# Patient Record
Sex: Male | Born: 1945 | Race: White | Hispanic: No | Marital: Married | Smoking: Never smoker
Health system: Southern US, Community
[De-identification: ages and names within clinical notes are randomized; demographics above are authoritative.]

## PROBLEM LIST (undated history)

## (undated) DIAGNOSIS — I495 Sick sinus syndrome: Secondary | ICD-10-CM

## (undated) DIAGNOSIS — Z950 Presence of cardiac pacemaker: Secondary | ICD-10-CM

## (undated) DIAGNOSIS — I4891 Unspecified atrial fibrillation: Secondary | ICD-10-CM

## (undated) DIAGNOSIS — C61 Malignant neoplasm of prostate: Secondary | ICD-10-CM

## (undated) DIAGNOSIS — M109 Gout, unspecified: Secondary | ICD-10-CM

## (undated) DIAGNOSIS — I429 Cardiomyopathy, unspecified: Secondary | ICD-10-CM

## (undated) DIAGNOSIS — N3289 Other specified disorders of bladder: Secondary | ICD-10-CM

## (undated) DIAGNOSIS — I639 Cerebral infarction, unspecified: Secondary | ICD-10-CM

## (undated) DIAGNOSIS — Z87442 Personal history of urinary calculi: Secondary | ICD-10-CM

## (undated) DIAGNOSIS — R300 Dysuria: Secondary | ICD-10-CM

## (undated) DIAGNOSIS — I499 Cardiac arrhythmia, unspecified: Secondary | ICD-10-CM

## (undated) DIAGNOSIS — M1990 Unspecified osteoarthritis, unspecified site: Secondary | ICD-10-CM

## (undated) HISTORY — DX: Malignant neoplasm of prostate: C61

## (undated) HISTORY — PX: TONSILLECTOMY AND ADENOIDECTOMY: SUR1326

## (undated) HISTORY — DX: Cerebral infarction, unspecified: I63.9

## (undated) HISTORY — PX: INSERT / REPLACE / REMOVE PACEMAKER: SUR710

## (undated) HISTORY — DX: Unspecified atrial fibrillation: I48.91

## (undated) HISTORY — PX: REPLACEMENT TOTAL KNEE: SUR1224

## (undated) HISTORY — PX: PACEMAKER INSERTION: SHX728

## (undated) HISTORY — DX: Cardiomyopathy, unspecified: I42.9

## (undated) HISTORY — PX: INGUINAL HERNIA REPAIR: SUR1180

## (undated) HISTORY — DX: Gout, unspecified: M10.9

## (undated) HISTORY — DX: Unspecified osteoarthritis, unspecified site: M19.90

## (undated) HISTORY — DX: Sick sinus syndrome: I49.5

## (undated) HISTORY — DX: Other specified disorders of bladder: N32.89

## (undated) HISTORY — PX: PROSTATECTOMY: SHX69

## (undated) HISTORY — DX: Dysuria: R30.0

---

## 1978-12-13 HISTORY — PX: JOINT REPLACEMENT: SHX530

## 1995-12-14 HISTORY — PX: REPLACEMENT TOTAL HIP W/  RESURFACING IMPLANTS: SUR1222

## 2005-01-15 ENCOUNTER — Ambulatory Visit: Payer: Self-pay | Admitting: Gastroenterology

## 2008-03-29 ENCOUNTER — Ambulatory Visit: Payer: Self-pay | Admitting: Unknown Physician Specialty

## 2009-05-15 ENCOUNTER — Ambulatory Visit: Payer: Self-pay | Admitting: Cardiology

## 2009-05-22 ENCOUNTER — Ambulatory Visit: Payer: Self-pay | Admitting: Cardiology

## 2009-10-13 HISTORY — PX: OTHER SURGICAL HISTORY: SHX169

## 2009-10-30 ENCOUNTER — Ambulatory Visit: Payer: Self-pay | Admitting: Urology

## 2009-11-10 ENCOUNTER — Ambulatory Visit: Payer: Self-pay | Admitting: Urology

## 2009-11-18 ENCOUNTER — Ambulatory Visit: Payer: Self-pay

## 2010-09-28 ENCOUNTER — Ambulatory Visit: Payer: Self-pay | Admitting: General Practice

## 2010-09-28 ENCOUNTER — Ambulatory Visit: Payer: Self-pay | Admitting: Cardiology

## 2010-10-12 ENCOUNTER — Inpatient Hospital Stay: Payer: Self-pay | Admitting: General Practice

## 2011-02-12 ENCOUNTER — Ambulatory Visit: Payer: Self-pay | Admitting: Urology

## 2011-02-15 ENCOUNTER — Ambulatory Visit: Payer: Self-pay | Admitting: Urology

## 2011-02-25 ENCOUNTER — Ambulatory Visit: Payer: Self-pay | Admitting: Urology

## 2011-11-08 ENCOUNTER — Ambulatory Visit: Payer: Self-pay | Admitting: Internal Medicine

## 2012-07-31 ENCOUNTER — Ambulatory Visit: Payer: Self-pay | Admitting: Urology

## 2012-09-05 DIAGNOSIS — Z87442 Personal history of urinary calculi: Secondary | ICD-10-CM | POA: Insufficient documentation

## 2012-09-05 DIAGNOSIS — C61 Malignant neoplasm of prostate: Secondary | ICD-10-CM

## 2012-09-05 HISTORY — DX: Malignant neoplasm of prostate: C61

## 2012-11-16 ENCOUNTER — Ambulatory Visit: Payer: Self-pay | Admitting: Orthopedic Surgery

## 2012-12-13 HISTORY — PX: OTHER SURGICAL HISTORY: SHX169

## 2013-01-05 ENCOUNTER — Ambulatory Visit: Payer: Self-pay | Admitting: Oncology

## 2013-01-05 LAB — CBC CANCER CENTER
Basophil #: 0 x10 3/mm (ref 0.0–0.1)
Eosinophil #: 0.2 x10 3/mm (ref 0.0–0.7)
Eosinophil %: 4.2 %
HCT: 40.5 % (ref 40.0–52.0)
HGB: 13.8 g/dL (ref 13.0–18.0)
Lymphocyte #: 1.1 x10 3/mm (ref 1.0–3.6)
MCH: 31.3 pg (ref 26.0–34.0)
MCHC: 34.2 g/dL (ref 32.0–36.0)
Monocyte #: 0.4 x10 3/mm (ref 0.2–1.0)
Neutrophil #: 3.1 x10 3/mm (ref 1.4–6.5)
Neutrophil %: 64.6 %
Platelet: 122 x10 3/mm — ABNORMAL LOW (ref 150–440)
RDW: 13.9 % (ref 11.5–14.5)
WBC: 4.8 x10 3/mm (ref 3.8–10.6)

## 2013-01-05 LAB — COMPREHENSIVE METABOLIC PANEL
Albumin: 4.1 g/dL (ref 3.4–5.0)
Alkaline Phosphatase: 97 U/L (ref 50–136)
Bilirubin,Total: 1 mg/dL (ref 0.2–1.0)
Calcium, Total: 9.2 mg/dL (ref 8.5–10.1)
Chloride: 107 mmol/L (ref 98–107)
Creatinine: 0.81 mg/dL (ref 0.60–1.30)
EGFR (African American): >60
EGFR (Non-African Amer.): >60
Glucose: 88 mg/dL (ref 65–99)
Osmolality: 278 (ref 275–301)
Potassium: 4.8 mmol/L (ref 3.5–5.1)
Sodium: 139 mmol/L (ref 136–145)
Total Protein: 7.5 g/dL (ref 6.4–8.2)

## 2013-01-13 ENCOUNTER — Ambulatory Visit: Payer: Self-pay | Admitting: Oncology

## 2013-02-09 LAB — CBC CANCER CENTER
Basophil %: 1.1 %
Eosinophil %: 4 %
HCT: 39.5 % — ABNORMAL LOW (ref 40.0–52.0)
HGB: 13.6 g/dL (ref 13.0–18.0)
Lymphocyte #: 1.2 x10 3/mm (ref 1.0–3.6)
Lymphocyte %: 27.7 %
MCHC: 34.3 g/dL (ref 32.0–36.0)
Monocyte #: 0.3 x10 3/mm (ref 0.2–1.0)
Monocyte %: 7.6 %
Neutrophil %: 59.6 %
RDW: 13.9 % (ref 11.5–14.5)
WBC: 4.2 x10 3/mm (ref 3.8–10.6)

## 2013-02-10 ENCOUNTER — Ambulatory Visit: Payer: Self-pay | Admitting: Oncology

## 2013-02-23 LAB — CBC CANCER CENTER
Basophil #: 0 x10 3/mm (ref 0.0–0.1)
Basophil %: 0.6 %
Lymphocyte %: 24.2 %
MCH: 31.2 pg (ref 26.0–34.0)
MCHC: 33.8 g/dL (ref 32.0–36.0)
MCV: 93 fL (ref 80–100)
Monocyte #: 0.3 x10 3/mm (ref 0.2–1.0)
RBC: 4.02 10*6/uL — ABNORMAL LOW (ref 4.40–5.90)
WBC: 3.2 x10 3/mm — ABNORMAL LOW (ref 3.8–10.6)

## 2013-03-05 LAB — CBC CANCER CENTER
Basophil %: 0.5 %
Eosinophil #: 0.2 x10 3/mm (ref 0.0–0.7)
Eosinophil %: 3 %
HCT: 39.8 % — ABNORMAL LOW (ref 40.0–52.0)
HGB: 13.7 g/dL (ref 13.0–18.0)
Lymphocyte %: 16.7 %
MCH: 31.5 pg (ref 26.0–34.0)
MCV: 91 fL (ref 80–100)
Monocyte #: 0.5 x10 3/mm (ref 0.2–1.0)
Monocyte %: 8.8 %
Neutrophil %: 71 %
Platelet: 101 x10 3/mm — ABNORMAL LOW (ref 150–440)
RBC: 4.36 10*6/uL — ABNORMAL LOW (ref 4.40–5.90)
RDW: 14.4 % (ref 11.5–14.5)
WBC: 5.2 x10 3/mm (ref 3.8–10.6)

## 2013-03-09 LAB — CBC CANCER CENTER
Basophil #: 0 x10 3/mm (ref 0.0–0.1)
Basophil %: 0.4 %
Eosinophil %: 3.7 %
HCT: 38.2 % — ABNORMAL LOW (ref 40.0–52.0)
HGB: 13 g/dL (ref 13.0–18.0)
MCH: 31.3 pg (ref 26.0–34.0)
MCHC: 34.1 g/dL (ref 32.0–36.0)
Monocyte #: 0.4 x10 3/mm (ref 0.2–1.0)
Monocyte %: 9.1 %
Neutrophil #: 2.8 x10 3/mm (ref 1.4–6.5)
Neutrophil %: 70.3 %
Platelet: 107 x10 3/mm — ABNORMAL LOW (ref 150–440)
WBC: 4 x10 3/mm (ref 3.8–10.6)

## 2013-03-13 ENCOUNTER — Ambulatory Visit: Payer: Self-pay | Admitting: Oncology

## 2013-03-16 LAB — CBC CANCER CENTER
Eosinophil #: 0.1 x10 3/mm (ref 0.0–0.7)
Eosinophil %: 3.4 %
HCT: 39.2 % — ABNORMAL LOW (ref 40.0–52.0)
HGB: 13 g/dL (ref 13.0–18.0)
Lymphocyte #: 0.6 x10 3/mm — ABNORMAL LOW (ref 1.0–3.6)
MCH: 30.8 pg (ref 26.0–34.0)
MCHC: 33.2 g/dL (ref 32.0–36.0)
Monocyte #: 0.3 x10 3/mm (ref 0.2–1.0)
Monocyte %: 8.5 %
Neutrophil #: 2.7 x10 3/mm (ref 1.4–6.5)
Neutrophil %: 72.2 %
Platelet: 121 x10 3/mm — ABNORMAL LOW (ref 150–440)

## 2013-04-02 LAB — CBC CANCER CENTER
Basophil #: 0 x10 3/mm (ref 0.0–0.1)
Eosinophil #: 0.1 x10 3/mm (ref 0.0–0.7)
Eosinophil %: 2 %
HGB: 12.6 g/dL — ABNORMAL LOW (ref 13.0–18.0)
Lymphocyte #: 0.5 x10 3/mm — ABNORMAL LOW (ref 1.0–3.6)
Monocyte #: 0.3 x10 3/mm (ref 0.2–1.0)
Monocyte %: 8.5 %
Neutrophil %: 75.3 %
RBC: 3.98 10*6/uL — ABNORMAL LOW (ref 4.40–5.90)
RDW: 14.5 % (ref 11.5–14.5)
WBC: 3.6 x10 3/mm — ABNORMAL LOW (ref 3.8–10.6)

## 2013-04-06 LAB — CBC CANCER CENTER
Basophil #: 0 x10 3/mm (ref 0.0–0.1)
Basophil %: 0.2 %
Eosinophil #: 0.1 x10 3/mm (ref 0.0–0.7)
Eosinophil %: 1.7 %
HCT: 37.7 % — ABNORMAL LOW (ref 40.0–52.0)
HGB: 12.9 g/dL — ABNORMAL LOW (ref 13.0–18.0)
Lymphocyte #: 0.5 x10 3/mm — ABNORMAL LOW (ref 1.0–3.6)
MCH: 31.2 pg (ref 26.0–34.0)
Neutrophil %: 76.1 %

## 2013-04-12 ENCOUNTER — Ambulatory Visit: Payer: Self-pay | Admitting: Oncology

## 2013-05-13 ENCOUNTER — Ambulatory Visit: Payer: Self-pay | Admitting: Oncology

## 2013-06-07 LAB — CBC CANCER CENTER
Basophil #: 0 x10 3/mm (ref 0.0–0.1)
Eosinophil #: 0.1 x10 3/mm (ref 0.0–0.7)
HCT: 36.2 % — ABNORMAL LOW (ref 40.0–52.0)
Lymphocyte #: 0.6 x10 3/mm — ABNORMAL LOW (ref 1.0–3.6)
Lymphocyte %: 17.8 %
MCH: 31.9 pg (ref 26.0–34.0)
MCHC: 34.7 g/dL (ref 32.0–36.0)
MCV: 92 fL (ref 80–100)
Monocyte %: 8.6 %
Neutrophil #: 2.4 x10 3/mm (ref 1.4–6.5)
Neutrophil %: 71.3 %
Platelet: 102 x10 3/mm — ABNORMAL LOW (ref 150–440)
RBC: 3.95 10*6/uL — ABNORMAL LOW (ref 4.40–5.90)
RDW: 14.5 % (ref 11.5–14.5)
WBC: 3.4 x10 3/mm — ABNORMAL LOW (ref 3.8–10.6)

## 2013-06-07 LAB — BASIC METABOLIC PANEL
Anion Gap: 4 — ABNORMAL LOW (ref 7–16)
BUN: 19 mg/dL — ABNORMAL HIGH (ref 7–18)
Chloride: 109 mmol/L — ABNORMAL HIGH (ref 98–107)
Creatinine: 1.07 mg/dL (ref 0.60–1.30)
EGFR (African American): >60
EGFR (Non-African Amer.): >60
Glucose: 98 mg/dL (ref 65–99)
Osmolality: 283 (ref 275–301)
Potassium: 4.7 mmol/L (ref 3.5–5.1)

## 2013-06-08 LAB — PSA: PSA: 2.2 ng/mL (ref 0.0–4.0)

## 2013-06-12 ENCOUNTER — Ambulatory Visit: Payer: Self-pay | Admitting: Oncology

## 2013-06-12 ENCOUNTER — Ambulatory Visit: Payer: Self-pay | Admitting: Urology

## 2013-06-12 DIAGNOSIS — R350 Frequency of micturition: Secondary | ICD-10-CM | POA: Insufficient documentation

## 2013-06-12 DIAGNOSIS — N3289 Other specified disorders of bladder: Secondary | ICD-10-CM

## 2013-06-12 HISTORY — DX: Other specified disorders of bladder: N32.89

## 2013-08-21 ENCOUNTER — Ambulatory Visit: Payer: Self-pay | Admitting: General Practice

## 2013-08-21 LAB — APTT: Activated PTT: 35 secs (ref 23.6–35.9)

## 2013-08-22 LAB — URINE CULTURE

## 2013-09-05 ENCOUNTER — Inpatient Hospital Stay: Payer: Self-pay | Admitting: General Practice

## 2013-09-05 LAB — PROTIME-INR
INR: 1
Prothrombin Time: 13.5 secs (ref 11.5–14.7)

## 2013-09-06 LAB — BASIC METABOLIC PANEL
BUN: 13 mg/dL (ref 7–18)
Chloride: 105 mmol/L (ref 98–107)
Creatinine: 0.96 mg/dL (ref 0.60–1.30)
EGFR (Non-African Amer.): >60
Glucose: 135 mg/dL — ABNORMAL HIGH (ref 65–99)
Osmolality: 276 (ref 275–301)
Potassium: 4.2 mmol/L (ref 3.5–5.1)

## 2013-09-06 LAB — HEMOGLOBIN: HGB: 10 g/dL — ABNORMAL LOW (ref 13.0–18.0)

## 2013-09-06 LAB — PROTIME-INR: Prothrombin Time: 15.6 secs — ABNORMAL HIGH (ref 11.5–14.7)

## 2013-09-07 LAB — BASIC METABOLIC PANEL
Anion Gap: 5 — ABNORMAL LOW (ref 7–16)
BUN: 16 mg/dL (ref 7–18)
Calcium, Total: 8.2 mg/dL — ABNORMAL LOW (ref 8.5–10.1)
Co2: 26 mmol/L (ref 21–32)
Creatinine: 1.01 mg/dL (ref 0.60–1.30)
EGFR (African American): >60
Glucose: 111 mg/dL — ABNORMAL HIGH (ref 65–99)
Potassium: 4 mmol/L (ref 3.5–5.1)
Sodium: 135 mmol/L — ABNORMAL LOW (ref 136–145)

## 2013-11-21 DIAGNOSIS — R300 Dysuria: Secondary | ICD-10-CM

## 2013-11-21 HISTORY — DX: Dysuria: R30.0

## 2014-01-11 ENCOUNTER — Ambulatory Visit: Payer: Self-pay | Admitting: Unknown Physician Specialty

## 2014-01-15 LAB — PATHOLOGY REPORT

## 2014-09-10 DIAGNOSIS — I429 Cardiomyopathy, unspecified: Secondary | ICD-10-CM | POA: Insufficient documentation

## 2014-09-10 HISTORY — DX: Cardiomyopathy, unspecified: I42.9

## 2014-12-13 HISTORY — PX: CATARACT EXTRACTION W/ INTRAOCULAR LENS  IMPLANT, BILATERAL: SHX1307

## 2015-04-04 NOTE — Consult Note (Signed)
Reason for Visit: This 69 year old Male patient presents to the clinic for initial evaluation of  prostate cancer .   Referred by Dr. Grayland Ormond.  Diagnosis:   Chief Complaint/Diagnosis   69 year old male status post radical prostatectomy in 1997 now with rising PSA   Pathology Report pathology report has been requested for my review from his initial surgery    Imaging Report bone scan reviewed    Referral Report clinical notes reviewed    Planned Treatment Regimen possible salvage radiation therapy to prostatic fossa    HPI   patient is a 69 year old male poor historian diagnosed with rising PSA and adenocarcinoma the prostate back in 1997. Dr. Maryan Puls performed a radical prostatectomy. According to the patient shortly thereafter he underwent orchiectomy for presumed rising PSA. Do not have the official original pathology report for my review it but I have requested that. He was incontinent after surgery and has had and artificial sphincter placed. He was seen by Dr. Grayland Ormond underwent a bone scan which was essentially unremarkable except showed urinary incontinence. He has no evidence of bony pain. I been asked to evaluate the patient for possibility of salvage radiation therapy to his prostatic fossa. He has multiple medical comorbidities including each refer ablation pacemaker placed placement 3 years prior, bilateral hip replacements.patient has until recently been on Casodex despite rising PSA.  Past Hx:    Rheumatic Fever:    Kidney Stones:    sick sinus syndrome:    chronic atrial fibrillation:    Congenital Hip Disease:    Hyperlipidemia:    Gout:    Arrythmias:    Osteoarthritis:    Stroke: 1996   Tonsillectomy:    Hernia Repair:    Right Total Knee Replacement: 12-Oct-2010   Medtronic Pacemaker:    Prostate Surgery:    Prostate Cancer:    Bilateral Total Hip Replacement:   Past, Family and Social History:   Past Medical History positive     Cardiovascular atrial fibrillation; hyperlipidemia; pacemaker; sick sinusrheumatic fever    Genitourinary kidney stones    Neurological/Psychiatric CVA    Past Surgical History bilateral hip replacementsradical prostatectomy, right total knee replacement, herniorrhaphy repair, tonsillectomy    Past Medical History Comments osteoporosis    Family History positive    Family History Comments mother with cancer also family history of coronary disease    Social History noncontributory    Additional Past Medical and Surgical History seen by himself today   Allergies:   Latex: Itching  Home Meds:  Home Medications: Medication Instructions Status  amoxicillin 500mg  takes pre-dental procedurs or for sore throats prn Active  allopurinol 300mg  1 tab(s)  once a day AM Active  simvastatin 40mg  1 tab(s)  once a day (at bedtime)  Active  Coumadin 5mg  1   once a day PM Active  Saw Palmetto/Pumpkin seed oil 160mg  2   2 times a day  Active  Glucosamine 1000mg  2   once a day AM Active  potassium citrate 10 mEq oral tablet, extended release 1 tab(s) orally 4 times a day Active  Flax Oil oral capsule 1  orally once a day Active  acetaminophen-oxycodone 325 mg-5 mg oral tablet 1 tab(s) orally every 6 hours, As Needed, when kidney stones act up.  Active  Coumadin 2 mg oral tablet 1  orally once a day on Saturday with a 5mg . tablet. Active   Review of Systems:   Performance Status (ECOG) 0    Skin negative  Breast negative    Ophthalmologic negative    ENMT negative    Respiratory and Thorax negative    Cardiovascular see HPI    Gastrointestinal negative    Genitourinary see HPI    Musculoskeletal negative    Neurological negative    Psychiatric negative    Hematology/Lymphatics negative    Endocrine negative    Allergic/Immunologic negative    Review of Systems   except for urinary incontinence according to nurse's notesPatient denies any weight loss, fatigue,  weakness, fever, chills or night sweats. Patient denies any loss of vision, blurred vision. Patient denies any ringing  of the ears or hearing loss. No irregular heartbeat. Patient denies heart murmur or history of fainting. Patient denies any chest pain or pain radiating to her upper extremities. Patient denies any shortness of breath, difficulty breathing at night, cough or hemoptysis. Patient denies any swelling in the lower legs. Patient denies any nausea vomiting, vomiting of blood, or coffee ground material in the vomitus. Patient denies any stomach pain. Patient states has had normal bowel movements no significant constipation or diarrhea. Patient denies any dysuria, hematuria or significant nocturia. Patient denies any problems walking, swelling in the joints or loss of balance. Patient denies any skin changes, loss of hair or loss of weight. Patient denies any excessive worrying or anxiety or significant depression. Patient denies any problems with insomnia. Patient denies excessive thirst, polyuria, polydipsia. Patient denies any swollen glands, patient denies easy bruising or easy bleeding. Patient denies any recent infections, allergies or URI. Patient "s visual fields have not changed significantly in recent time.  Nursing Notes:  Nursing Vital Signs and Chemo Nursing Nursing Notes: *CC Vital Signs Flowsheet:   31-Jan-14 09:54   Temp Temperature 96.2   Pulse Pulse 85   Respirations Respirations 20   SBP SBP 138   DBP DBP 87   Pain Scale (0-10)  1   Current Weight (kg) (kg) 137.2   Height (cm) centimeters 185   BSA (m2) 2.5    10:52   Temp Temperature 97.6   Pulse Pulse 78   SBP SBP 148   DBP DBP 94   Pain Scale (0-10)  0   Current Weight (kg) (kg) 137.1   Height (cm) centimeters 185   BSA (m2) 2.5   Physical Exam:  General/Skin/HEENT:   General normal    Skin normal    Eyes normal    ENMT normal    Head and Neck normal    Additional PE well-developed obese male in  NAD. Lungs are clear to A&P cardiac examination shows irregular rate and rhythm. Abdomen is benign. On rectal exam rectal sphincter tone is good. Prostate fossa is clear without evidence of nodularity or mass.   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  Plan:   this time I am continue to obtain his original pathology report for establishment of exact stage of his disease at time of prostatectomy. Depending on those results may alter my treatment plan to include his pelvic lymph nodes should this be high-grade Gleason score or significant chance of pelvic lymph node involvement. Otherwise we treat the prostatic  fossa up to 7600 cGy using IMRT treatment planning and delivery with daily image guided radiation therapy. Risks and benefits of treatment including possibility of diarrhea dysuria fatigue and alteration of blood counts were all explained in detail to the patient. Patient wears a pad as or incontinence I've told him I do not think radiation will exacerbate his incontinence. Patient seems to understand my treatment plan well. I will see him back in a proximally week for CT simulation.  I would like to take this opportunity to thank you for allowing me to continue to participate in this patient's care.  CC Referral:   cc: Dr. Emily Filbert   Electronic Signatures: Baruch Gouty, Roda Shutters (MD)  (Signed 31-Jan-14 11:30)  Authored: HPI, Diagnosis, Past Hx, PFSH, Allergies, Home Meds, ROS, Nursing Notes, Physical Exam, Encounter Assessment and Plan, CC Referring Physician   Last Updated: 31-Jan-14 11:30 by Armstead Peaks  (MD)

## 2015-04-04 NOTE — Consult Note (Signed)
Catheter removed.reactivated.may have slight increased leakage for several days but should improve.up as needed.  Electronic Signatures: Murrell Redden (MD)  (Signed on 26-Sep-14 12:21)  Authored  Last Updated: 26-Sep-14 12:21 by Murrell Redden (MD)

## 2015-04-04 NOTE — Op Note (Signed)
PATIENT NAME:  Jeremy Carney, Jeremy Carney MR#:  [id number] DATE OF BIRTH:  Dec 08, 1946  DATE OF PROCEDURE:  09/05/2013  PREOPERATIVE DIAGNOSIS: Degenerative arthrosis of the left knee.   POSTOPERATIVE DIAGNOSIS: Degenerative arthrosis of the left knee.   PROCEDURE PERFORMED: Left total knee arthroplasty using computer-assisted navigation.   SURGEON: Laurice Record. Holley Bouche., M.D.   ASSISTANT: Vance Peper, P.A. (required to maintain retraction throughout the procedure).   ANESTHESIA: Spinal.   ESTIMATED BLOOD LOSS: 100 mL.   FLUIDS REPLACED: 2000 mL of crystalloid.   TOURNIQUET TIME: 112 minutes.   DRAINS: Two medium drains to reinfusion system.   SOFT TISSUE RELEASES: Anterior cruciate ligament, posterior cruciate ligament, deep medial collateral ligament, patellofemoral ligament, posterolateral corner and pie crusting of the iliotibial band.   IMPLANTS UTILIZED: DePuy PFC Sigma size 5 posterior stabilized femoral component (cemented), size 5 MBT tibial component (cemented), 41 mm 3 peg oval dome patella (cemented), a 10 mm stabilized rotating platform polyethylene insert. Gentamicin cement was used for the procedure.   INDICATIONS FOR SURGERY: The patient is a 69 year old male who has been seen for complaints of progressive left knee pain. X-rays demonstrated severe degenerative changes in tricompartmental fashion with relative varus deformity. After discussion of the risks and benefits of surgical intervention, the patient expressed understanding of the risks and benefits and agreed with plans for surgical intervention.   PROCEDURE IN DETAIL: The patient was brought into the operating room, and after adequate spinal anesthesia was achieved, a tourniquet was placed on the patient's upper left thigh. The patient's left knee and leg were cleaned and prepped with alcohol and DuraPrep and draped in the usual sterile fashion. A "timeout" was performed as per usual protocol. The left lower extremity was  exsanguinated using an Esmarch, and the tourniquet was inflated to 300 mmHg. An anterior longitudinal incision was made followed by a standard mid vastus approach. A moderate effusion was evacuated. Deep fibers of the medial collateral ligament were elevated in a subperiosteal fashion off the medial flare of the tibia so as to maintain a continuous soft tissue sleeve. The patella was subluxed laterally, and the patellofemoral ligament was incised. Inspection of the knee demonstrated severe degenerative changes in tricompartmental fashion. Large osteophytes were debrided using a rongeur. The anterior and posterior cruciate ligaments were excised. Two 4.0 mm Schanz pins were inserted into the femur and into the tibia for attachment of the array of trackers used for computer-assisted navigation. Hip center was identified using a circumduction technique. Distal landmarks were mapped using the computer. The distal femur and proximal tibia were mapped using the computer. Distal femoral cutting guide was positioned using computer-assisted navigation so as to achieve a 5-degree distal valgus cut. Cut was performed and verified using the computer. Distal femur was sized, and it was felt that a size 5 femoral component was appropriate. A size 5 cutting guide was positioned, and the anterior cut was performed and verified using the computer. This was followed by completion of the posterior and chamfer cuts. Femoral cutting guide for the central box was then positioned, and the central box cut was performed. Attention was then directed to the proximal tibia. Medial and lateral menisci were excised. The extramedullary tibial cutting guide was positioned using computer-assisted navigation so as to achieve 0-degree varus and valgus alignment and 0-degree posterior slope. Cut was performed and verified using the computer. The proximal tibia was sized, and it was felt that a size 5 tibial tray was appropriate. Tibial and femoral  trials were inserted, and a 10 mm polyethylene trial was inserted. The knee was felt to be tight laterally. The trial components were removed, and the knee was placed in extension and distracted using Moreland retractors. The posterolateral corner was carefully released using a combination of electrocautery and Metzenbaum scissors. Trial components were replaced with improvement of soft tissue balancing. However, the IT band was still felt to be tight, and a pie crusting technique was performed along the IT band with excellent soft tissue balancing subsequently noted. Finally, the patella was cut and prepared so as to accommodate a 41 mm 3 peg oval dome patella. Patellar trial was placed, and the knee was placed through a range of motion with excellent patellar tracking appreciated.   Femoral component was removed after debridement of posterior osteophytes. The central post hole for the tibial component was reamed followed by insertion of a keel punch. Tibial trial was then removed. The cut surfaces of bone were irrigated with copious amounts of normal saline with antibiotic solution using pulsatile lavage and then suctioned dry. Polymethyl methacrylate cement with gentamicin was prepared in the usual fashion using a vacuum mixer. Cement was applied to the cut surface of the proximal tibia as well as along the undersurface of a size 5 MBT tibial component. The tibial component was positioned and impacted into place. Excess cement was removed using [employer] fast streamer. Cement was then applied to the cut surface of the femur as well as along the posterior flanges of a size 5 posterior stabilized femoral component. Femoral component was positioned and impacted into place. Excess cement was removed using [employer] fast streamer. A 10 mm polyethylene trial was inserted, and the knee was brought into full extension with steady axial compression applied. Finally, cement was applied to the backside of a 41 mm 3 peg oval dome patella,  and the patellar component was positioned and patellar clamp applied. Excess cement was removed using [employer] fast streamer.   After adequate curing of cement, the tourniquet was deflated after a total tourniquet time of 112 minutes. Hemostasis was achieved using electrocautery. The knee was irrigated with copious amounts of normal saline with antibiotic solution using pulsatile lavage and then suctioned dry. The knee was inspected for any residual cement debris, Then, 20 mL of 1.3% Exparel in 40 mL of normal saline was injected along the posterior capsule, medial and lateral gutters, and along the arthrotomy site. A 10 mm stabilized rotating platform polyethylene insert was inserted with excellent medial and lateral soft tissue balancing both in full extension and in flexion. Two medium drains were placed in the wound bed and brought out through a separate stab incision to be attached to a reinfusion system. The medial parapatellar portion of the incision was reapproximated using interrupted sutures of #1 Vicryl. The subcutaneous tissue was injected with a total of 30 mL of 0.25% Marcaine with epinephrine. The subcutaneous tissue was then reapproximated using first 0 Vicryl followed by #2-0 Vicryl. Skin was closed with skin staples. A sterile dressing was applied.   The patient tolerated the procedure well. He was transported to the recovery room in stable condition.    ____________________________ Laurice Record. Holley Bouche., MD jph:gb D: 09/05/2013 23:35:14 ET T: 09/06/2013 00:06:03 ET JOB#: [id number]  cc: Jeneen Rinks P. Holley Bouche., MD, <Dictator> Laurice Record Holley Bouche MD ELECTRONICALLY SIGNED 09/15/2013 9:37

## 2015-04-04 NOTE — Consult Note (Signed)
Okay to remove Foley catheter per your routine. Okay for nurse to remove.  He will just have leakage once the catheter is removed until the sphincter is reactivated. He will likely need to utilize a depend for a short period of time.   I will be by tomorrow early afternoon to reactivate. Please feel free to contact us if there are any further questions.  Electronic Signatures: Murrell Redden (MD)  (Signed on 25-Sep-14 10:56)  Authored  Last Updated: 25-Sep-14 10:56 by Murrell Redden (MD)

## 2015-07-01 ENCOUNTER — Ambulatory Visit (INDEPENDENT_AMBULATORY_CARE_PROVIDER_SITE_OTHER): Payer: PPO | Admitting: Urology

## 2015-07-01 ENCOUNTER — Encounter: Payer: Self-pay | Admitting: Urology

## 2015-07-01 VITALS — BP 131/79 | HR 55 | Ht 72.0 in | Wt 303.9 lb

## 2015-07-01 DIAGNOSIS — R350 Frequency of micturition: Secondary | ICD-10-CM | POA: Diagnosis not present

## 2015-07-01 LAB — URINALYSIS, COMPLETE
Bilirubin, UA: NEGATIVE
Glucose, UA: NEGATIVE
Nitrite, UA: POSITIVE — AB
PH UA: 5.5 (ref 5.0–7.5)
UUROB: 0.2 mg/dL (ref 0.2–1.0)

## 2015-07-01 LAB — BLADDER SCAN AMB NON-IMAGING: SCAN RESULT: 25

## 2015-07-01 LAB — MICROSCOPIC EXAMINATION

## 2015-07-01 NOTE — Progress Notes (Signed)
07/01/2015 11:39 AM   Jeremy Carney 17-Feb-1946 [id number]  Referring provider: No referring provider defined for this encounter.  Chief Complaint  Patient presents with  . Urinary Frequency    HPI: Patient has a long urologic history. Radical prostatectomy in 1999 chemical recurrence PSA 2007 with radiation. Total incontinence post prostatectomy control with an artificial urinary sphincter( AUS). Presently experiencing increasing incontinence with no treatment for same. He is concerned about his recurrence of a blood of bladder calculus and/or cancer from radiation.   PMH: No past medical history on file.  Surgical History: No past surgical history on file.  Home Medications:    Medication List       This list is accurate as of: 07/01/15 11:39 AM.  Always use your most recent med list.               allopurinol 300 MG tablet  Commonly known as:  ZYLOPRIM  Take by mouth.     amoxicillin 500 MG capsule  Commonly known as:  AMOXIL  Take by mouth.     Glucosamine Sulfate 1000 MG Caps  Take by mouth.     omeprazole 40 MG capsule  Commonly known as:  PRILOSEC     oxyCODONE-acetaminophen 5-325 MG per tablet  Commonly known as:  PERCOCET/ROXICET  Take by mouth.     potassium citrate 10 MEQ (1080 MG) SR tablet  Commonly known as:  UROCIT-K     simvastatin 40 MG tablet  Commonly known as:  ZOCOR  Take by mouth.     warfarin 5 MG tablet  Commonly known as:  COUMADIN  TAKE ONE (1) TABLET EACH DAY     warfarin 2 MG tablet  Commonly known as:  COUMADIN  TAKE 1 TABLET EACH WEEK        Allergies:  Allergies  Allergen Reactions  . Latex Rash    Sensitivity, not allergy.    Family History: No family history on file.  Social History:  reports that he has never smoked. He does not have any smokeless tobacco history on file. He reports that he does not drink alcohol or use illicit drugs.  ROS: UROLOGY Frequent Urination?: Yes Hard to postpone  urination?: Yes Burning/pain with urination?: No Get up at night to urinate?: Yes Leakage of urine?: Yes Urine stream starts and stops?: No Trouble starting stream?: No Do you have to strain to urinate?: No Blood in urine?: No Urinary tract infection?: No Sexually transmitted disease?: No Injury to kidneys or bladder?: No Painful intercourse?: No Weak stream?: No Erection problems?: No Penile pain?: Yes  Gastrointestinal Nausea?: No Vomiting?: No Indigestion/heartburn?: Yes Diarrhea?: No Constipation?: No  Constitutional Fever: No Night sweats?: No Weight loss?: No Fatigue?: No  Skin Skin rash/lesions?: No Itching?: No  Eyes Blurred vision?: No Double vision?: No  Ears/Nose/Throat Sore throat?: No Sinus problems?: No  Hematologic/Lymphatic Swollen glands?: No Easy bruising?: Yes  Cardiovascular Leg swelling?: No Chest pain?: No  Respiratory Cough?: No Shortness of breath?: No  Endocrine Excessive thirst?: No  Musculoskeletal Back pain?: No Joint pain?: Yes  Neurological Headaches?: Yes Dizziness?: Yes  Psychologic Depression?: No Anxiety?: No  Physical Exam: BP 131/79 mmHg  Pulse 55  Ht 6' (1.829 m)  Wt 303 lb 14.4 oz (137.848 kg)  BMI 41.21 kg/m2  Constitutional:  Alert and oriented, No acute distress. HEENT: [place] AT, moist mucus membranes.  Trachea midline, no masses. Cardiovascular: No clubbing, cyanosis, or edema. Respiratory: Normal respiratory effort, no increased  work of breathing. GI: Abdomen is soft, nontender, nondistended, no abdominal masses GU: No CVA tenderness. Sphincter on intact and prostate nonpalpable not painful Skin: No rashes, bruises or suspicious lesions. Lymph: No cervical or inguinal adenopathy. Neurologic: Grossly intact, no focal deficits, moving all 4 extremities. Psychiatric: Normal mood and affect.  Laboratory Data: Lab Results  Component Value Date   WBC 3.4* 06/07/2013   HGB 8.9* 09/07/2013   HCT  36.2* 06/07/2013   MCV 92 06/07/2013   PLT 73* 09/07/2013    Lab Results  Component Value Date   CREATININE 1.01 09/07/2013    Lab Results  Component Value Date   PSA 2.2 06/07/2013   PSA 3.6 01/05/2013    No results found for: TESTOSTERONE  No results found for: HGBA1C  Urinalysis No results found for: COLORURINE, APPEARANCEUR, LABSPEC, PHURINE, GLUCOSEU, HGBUR, BILIRUBINUR, KETONESUR, PROTEINUR, UROBILINOGEN, NITRITE, LEUKOCYTESUR  Pertinent Imaging:none   Assessment & Plan:  Increasing urinary urgency and incontinence. Samples of Vesicare 10 mg given to the patient one month follow-up is in one month for cystoscopy as the patient has had extensive radiation for his recurrent prostate cancer and has had a history of bladder stone  1. Urinary frequency  - Urinalysis, Complete - BLADDER SCAN AMB NON-IMAGING   No Follow-up on file.  Collier Flowers, [facility] [address], [place], [place] [address] [phone]

## 2015-08-05 ENCOUNTER — Other Ambulatory Visit: Payer: PPO | Admitting: Urology

## 2015-09-12 DIAGNOSIS — E782 Mixed hyperlipidemia: Secondary | ICD-10-CM | POA: Insufficient documentation

## 2015-09-19 ENCOUNTER — Encounter: Payer: Self-pay | Admitting: Urology

## 2015-09-19 ENCOUNTER — Other Ambulatory Visit: Payer: PPO | Admitting: Urology

## 2015-10-30 ENCOUNTER — Ambulatory Visit (INDEPENDENT_AMBULATORY_CARE_PROVIDER_SITE_OTHER): Payer: PPO | Admitting: Urology

## 2015-10-30 ENCOUNTER — Encounter: Payer: Self-pay | Admitting: Urology

## 2015-10-30 ENCOUNTER — Other Ambulatory Visit: Payer: Self-pay

## 2015-10-30 VITALS — BP 129/96 | HR 67 | Ht 72.0 in | Wt 302.6 lb

## 2015-10-30 DIAGNOSIS — M1990 Unspecified osteoarthritis, unspecified site: Secondary | ICD-10-CM | POA: Insufficient documentation

## 2015-10-30 DIAGNOSIS — Z8776 Personal history of (corrected) congenital malformations of integument, limbs and musculoskeletal system: Secondary | ICD-10-CM | POA: Insufficient documentation

## 2015-10-30 DIAGNOSIS — I639 Cerebral infarction, unspecified: Secondary | ICD-10-CM | POA: Insufficient documentation

## 2015-10-30 DIAGNOSIS — C61 Malignant neoplasm of prostate: Secondary | ICD-10-CM

## 2015-10-30 DIAGNOSIS — Z87768 Personal history of other specified (corrected) congenital malformations of integument, limbs and musculoskeletal system: Secondary | ICD-10-CM | POA: Insufficient documentation

## 2015-10-30 DIAGNOSIS — I495 Sick sinus syndrome: Secondary | ICD-10-CM | POA: Insufficient documentation

## 2015-10-30 DIAGNOSIS — M109 Gout, unspecified: Secondary | ICD-10-CM

## 2015-10-30 DIAGNOSIS — I4891 Unspecified atrial fibrillation: Secondary | ICD-10-CM

## 2015-10-30 HISTORY — DX: Gout, unspecified: M10.9

## 2015-10-30 HISTORY — DX: Unspecified atrial fibrillation: I48.91

## 2015-10-30 HISTORY — DX: Cerebral infarction, unspecified: I63.9

## 2015-10-30 HISTORY — DX: Malignant neoplasm of prostate: C61

## 2015-10-30 HISTORY — DX: Unspecified osteoarthritis, unspecified site: M19.90

## 2015-10-30 HISTORY — DX: Sick sinus syndrome: I49.5

## 2015-10-30 LAB — URINALYSIS, COMPLETE
Bilirubin, UA: NEGATIVE
Glucose, UA: NEGATIVE
Ketones, UA: NEGATIVE
Nitrite, UA: NEGATIVE
PH UA: 5.5 (ref 5.0–7.5)
Specific Gravity, UA: 1.025 (ref 1.005–1.030)
Urobilinogen, Ur: 0.2 mg/dL (ref 0.2–1.0)

## 2015-10-30 LAB — MICROSCOPIC EXAMINATION
RBC MICROSCOPIC, UA: NONE SEEN /HPF (ref 0–?)
WBC, UA: >30 /hpf — ABNORMAL HIGH (ref 0–?)

## 2015-10-30 MED ORDER — CIPROFLOXACIN HCL 500 MG PO TABS: 500.0000 mg | ORAL_TABLET | Freq: Once | ORAL | Status: DC

## 2015-10-30 MED ORDER — LIDOCAINE HCL 2 % EX GEL: 1.0000 "application " | Freq: Once | CUTANEOUS | Status: DC

## 2015-10-30 NOTE — Progress Notes (Signed)
U/A suggests infection. Will culture urine and reschedule cystoscopy

## 2015-11-01 LAB — CULTURE, URINE COMPREHENSIVE

## 2015-11-10 ENCOUNTER — Other Ambulatory Visit: Payer: Self-pay | Admitting: Urology

## 2015-11-10 ENCOUNTER — Telehealth: Payer: Self-pay

## 2015-11-10 DIAGNOSIS — R319 Hematuria, unspecified: Principal | ICD-10-CM

## 2015-11-10 DIAGNOSIS — N390 Urinary tract infection, site not specified: Secondary | ICD-10-CM

## 2015-11-10 MED ORDER — AMOXICILLIN-POT CLAVULANATE 875-125 MG PO TABS: 1.0000 | ORAL_TABLET | Freq: Two times a day (BID) | ORAL | Status: DC

## 2015-11-10 NOTE — Telephone Encounter (Signed)
Please start patient on Bactrim DS BID for a week. Thanks.bb   Jeremy Carney was able to handle medication. Script sent to [facility].

## 2015-11-26 ENCOUNTER — Ambulatory Visit: Payer: PPO | Admitting: Urology

## 2015-11-26 VITALS — BP 153/97 | HR 97 | Ht 72.0 in | Wt 304.5 lb

## 2015-11-26 DIAGNOSIS — R350 Frequency of micturition: Secondary | ICD-10-CM

## 2015-11-26 LAB — MICROSCOPIC EXAMINATION: RBC MICROSCOPIC, UA: NONE SEEN /HPF (ref 0–?)

## 2015-11-26 LAB — URINALYSIS, COMPLETE
Bilirubin, UA: NEGATIVE
Glucose, UA: NEGATIVE
KETONES UA: NEGATIVE
Leukocytes, UA: NEGATIVE
NITRITE UA: NEGATIVE
PH UA: 5 (ref 5.0–7.5)
Protein, UA: NEGATIVE
RBC, UA: NEGATIVE
SPEC GRAV UA: 1.025 (ref 1.005–1.030)
Urobilinogen, Ur: 0.2 mg/dL (ref 0.2–1.0)

## 2015-11-26 MED ORDER — LIDOCAINE HCL 2 % EX GEL
1.0000 "application " | Freq: Once | CUTANEOUS | Status: AC
  Administered 2015-11-26: 1 via URETHRAL

## 2015-11-26 MED ORDER — CIPROFLOXACIN HCL 500 MG PO TABS
500.0000 mg | ORAL_TABLET | Freq: Once | ORAL | Status: AC
  Administered 2015-11-26: 500 mg via ORAL

## 2015-11-26 NOTE — Progress Notes (Signed)
11/26/2015 2:44 PM   Jeremy Carney Apr 13, 1946 [id number]  Referring provider: Rusty Aus, MD [facility] [place], [place] [address]  Chief Complaint  Patient presents with  . Cysto    HPI: The patient is a 69 year old gentleman with a radical prostatectomy 1999 with biochemical recurrence in 2007 treated with IMRT.  He had an orchiectomy in 2010 after his PSA began to rise again. He chose this option of Lupron. He was completely incontinent after his prostatectomy and AUS was placed. The patient states that his AUS works well. He notes no leakage at this time.  His last PSA is available to me 0.35 in August 2015.    PMH: Past Medical History  Diagnosis Date  . Atrial fibrillation ([place]) 10/30/2015  . Cardiomyopathy ([place]) 09/10/2014    Overview:  EF 35%,4/15   . Sinoatrial node dysfunction (HCC) 10/30/2015    Overview:  DDD pacemaker, 1996   . Cerebrovascular accident (CVA) ([place]) 10/30/2015    Overview:  Left embolic [id number]   . Arthritis, degenerative 10/30/2015  . Malignant neoplasm of prostate ([place]) 09/05/2012  . Carcinoma of prostate ([place]) 10/30/2015  . Bladder spasm 06/12/2013  . Difficult or painful urination 11/21/2013  . Gout 10/30/2015    Surgical History: Past Surgical History  Procedure Laterality Date  . Urinary incontinence valve      AMS  . Replacement total knee Bilateral   . Prostatectomy    . Castration  10/2009  . Left knee replacement  2014  . Inguinal hernia repair Left   . Tonsillectomy and adenoidectomy    . Pacemaker insertion      Home Medications:    Medication List       This list is accurate as of: 11/26/15  2:44 PM.  Always use your most recent med list.               allopurinol 300 MG tablet  Commonly known as:  ZYLOPRIM  Take by mouth.     Glucosamine Sulfate 1000 MG Caps  Take by mouth.     hydrochlorothiazide 25 MG tablet  Commonly known as:  HYDRODIURIL  Take by mouth.       omeprazole 40 MG capsule  Commonly known as:  PRILOSEC     oxyCODONE-acetaminophen 5-325 MG tablet  Commonly known as:  PERCOCET/ROXICET  Take by mouth.     potassium citrate 10 MEQ (1080 MG) SR tablet  Commonly known as:  UROCIT-K     simvastatin 40 MG tablet  Commonly known as:  ZOCOR  Take by mouth.     warfarin 5 MG tablet  Commonly known as:  COUMADIN  TAKE ONE (1) TABLET EACH DAY        Allergies:  Allergies  Allergen Reactions  . Latex Rash    Sensitivity, not allergy.    Family History: Family History  Problem Relation Age of Onset  . Prostate cancer Neg Hx   . Bladder Cancer Neg Hx     Social History:  reports that he has never smoked. He does not have any smokeless tobacco history on file. He reports that he does not drink alcohol or use illicit drugs.  ROS:                                        Physical Exam: BP 153/97 mmHg  Pulse  97  Ht 6' (1.829 m)  Wt 304 lb 8 oz (138.12 kg)  BMI 41.29 kg/m2  Constitutional:  Alert and oriented, No acute distress. HEENT: [place] AT, moist mucus membranes.  Trachea midline, no masses. Cardiovascular: No clubbing, cyanosis, or edema. Respiratory: Normal respiratory effort, no increased work of breathing. GI: Abdomen is soft, nontender, nondistended, no abdominal masses GU: No CVA tenderness. Normal phallus. Testicles surgically absent. AUS palpable. Skin: No rashes, bruises or suspicious lesions. Lymph: No cervical or inguinal adenopathy. Neurologic: Grossly intact, no focal deficits, moving all 4 extremities. Psychiatric: Normal mood and affect.  Laboratory Data: Lab Results  Component Value Date   WBC 3.4* 06/07/2013   HGB 8.9* 09/07/2013   HCT 36.2* 06/07/2013   MCV 92 06/07/2013   PLT 73* 09/07/2013    Lab Results  Component Value Date   CREATININE 1.01 09/07/2013    Lab Results  Component Value Date   PSA 2.2 06/07/2013   PSA 3.6 01/05/2013    No results found for:  TESTOSTERONE  No results found for: HGBA1C  Urinalysis    Component Value Date/Time   GLUCOSEU Negative 10/30/2015 1451   BILIRUBINUR Negative 10/30/2015 1451   NITRITE Negative 10/30/2015 1451   LEUKOCYTESUR 2+* 10/30/2015 1451       Cystoscopy Procedure Note  Patient identification was confirmed, informed consent was obtained, and patient was prepped using Betadine solution.  Lidocaine jelly was administered per urethral meatus.    Preoperative abx where received prior to procedure.     Pre-Procedure: - Inspection reveals a normal caliber ureteral meatus.  Procedure: The patient's AUS was deactivated. The flexible cystoscope was introduced without difficulty - No urethral strictures/lesions are present. -No evidence of erosion of his AUS - Bilateral ureteral orifices identified - No bladder stones - No trabeculation  There were hypervascular nodules in the posterior floor of the bladder.   Post-Procedure: - Patient tolerated the procedure well  Assessment & Plan:    1. Bladder tumor -TURBT next  2. History of prostate cancer s/p prostatectomy, salvage IMRT, orchiectomy Check PSA today   3. Incontinence Managed well with his [place], MD  [facility] [address] [place], [place] [address] [phone]

## 2015-11-27 LAB — PSA: PROSTATE SPECIFIC AG, SERUM: 0.5 ng/mL (ref 0.0–4.0)

## 2015-11-28 ENCOUNTER — Telehealth: Payer: Self-pay

## 2015-11-28 DIAGNOSIS — N390 Urinary tract infection, site not specified: Secondary | ICD-10-CM

## 2015-11-28 LAB — CULTURE, URINE COMPREHENSIVE

## 2015-11-28 MED ORDER — CIPROFLOXACIN HCL 500 MG PO TABS: 500.0000 mg | ORAL_TABLET | Freq: Two times a day (BID) | ORAL | Status: AC

## 2015-11-28 NOTE — Telephone Encounter (Signed)
-----   Message from Nickie Retort, MD sent at 11/28/2015  2:31 PM EST ----- Can we start him on cipro 500 mg BID for ten days. thanks

## 2015-11-28 NOTE — Telephone Encounter (Signed)
LMOM-medication sent to pharmacy 

## 2015-12-01 NOTE — Telephone Encounter (Signed)
Pt has called and left messages-returned call-LMOM

## 2015-12-02 NOTE — Telephone Encounter (Signed)
LMOM

## 2015-12-04 NOTE — Telephone Encounter (Signed)
Spoke with Louie Casa from [facility] who stated pt has picked up medication.

## 2015-12-19 ENCOUNTER — Encounter
Admission: RE | Admit: 2015-12-19 | Discharge: 2015-12-19 | Disposition: A | Discharge status: Home / Self Care | Payer: PPO | Source: Ambulatory Visit | Attending: Urology | Admitting: Urology

## 2015-12-19 DIAGNOSIS — Z01812 Encounter for preprocedural laboratory examination: Secondary | ICD-10-CM | POA: Insufficient documentation

## 2015-12-19 DIAGNOSIS — Z8673 Personal history of transient ischemic attack (TIA), and cerebral infarction without residual deficits: Secondary | ICD-10-CM | POA: Diagnosis not present

## 2015-12-19 LAB — APTT: aPTT: 34 seconds (ref 24–36)

## 2015-12-19 LAB — DIFFERENTIAL
Basophils Absolute: 0 10*3/uL (ref 0–0.1)
Basophils Relative: 0 %
EOS ABS: 0.1 10*3/uL (ref 0–0.7)
EOS PCT: 2 %
LYMPHS PCT: 18 %
Lymphs Abs: 0.9 10*3/uL — ABNORMAL LOW (ref 1.0–3.6)
MONO ABS: 0.4 10*3/uL (ref 0.2–1.0)
Monocytes Relative: 9 %
NEUTROS PCT: 71 %
Neutro Abs: 3.4 10*3/uL (ref 1.4–6.5)

## 2015-12-19 LAB — BASIC METABOLIC PANEL
Anion gap: 6 (ref 5–15)
BUN: 30 mg/dL — AB (ref 6–20)
CALCIUM: 9.4 mg/dL (ref 8.9–10.3)
CHLORIDE: 105 mmol/L (ref 101–111)
CO2: 26 mmol/L (ref 22–32)
CREATININE: 0.77 mg/dL (ref 0.61–1.24)
GFR calc non Af Amer: >60 mL/min (ref >60)
Glucose, Bld: 99 mg/dL (ref 65–99)
Potassium: 4.3 mmol/L (ref 3.5–5.1)
SODIUM: 137 mmol/L (ref 135–145)

## 2015-12-19 LAB — CBC
HCT: 39.1 % — ABNORMAL LOW (ref 40.0–52.0)
Hemoglobin: 13.4 g/dL (ref 13.0–18.0)
MCH: 30.4 pg (ref 26.0–34.0)
MCHC: 34.2 g/dL (ref 32.0–36.0)
MCV: 89.1 fL (ref 80.0–100.0)
PLATELETS: 126 10*3/uL — AB (ref 150–440)
RBC: 4.39 MIL/uL — AB (ref 4.40–5.90)
RDW: 15.5 % — ABNORMAL HIGH (ref 11.5–14.5)
WBC: 4.8 10*3/uL (ref 3.8–10.6)

## 2015-12-19 LAB — PROTIME-INR
INR: 1.77
PROTHROMBIN TIME: 20.6 s — AB (ref 11.4–15.0)

## 2015-12-19 NOTE — Pre-Procedure Instructions (Signed)
Dr. Ramon Dredge last note in epic is 11/26/15, this will be greater than 18 days old.  Spoke with Olivia Mackie at Dr. Ramon Dredge office, she checked with Dr. Su Grand, he will do another H+P the day of surgery.

## 2015-12-19 NOTE — Patient Instructions (Signed)
  Your procedure is scheduled on: Wednesday Dec 31, 2015. Report to Same Day Surgery. To find out your arrival time please call [phone] between 1PM - 3PM on Tuesday Jan. 17, 2017.  Remember: Instructions that are not followed completely may result in serious medical risk, up to and including death, or upon the discretion of your surgeon and anesthesiologist your surgery may need to be rescheduled.    _x___ 1. Do not eat food or drink liquids after midnight. No gum chewing or hard candies.     ____ 2. No Alcohol for 24 hours before or after surgery.   ____ 3. Bring all medications with you on the day of surgery if instructed.    __x__ 4. Notify your doctor if there is any change in your medical condition     (cold, fever, infections).     Do not wear jewelry, make-up, hairpins, clips or nail polish.  Do not wear lotions, powders, or perfumes. You may wear deodorant.  Do not shave 48 hours prior to surgery. Men may shave face and neck.  Do not bring valuables to the hospital.    [facility] is not responsible for any belongings or valuables.               Contacts, dentures or bridgework may not be worn into surgery.  Leave your suitcase in the car. After surgery it may be brought to your room.  For patients admitted to the hospital, discharge time is determined by your treatment team.   Patients discharged the day of surgery will not be allowed to drive home.    Please read over the following fact sheets that you were given:   [facility] Preparing for Surgery  ____ Take these medicines the morning of surgery with A SIP OF WATER: NONE     ____ Fleet Enema (as directed)   ____ Use CHG Soap as directed  ____ Use inhalers on the day of surgery  ____ Stop metformin 2 days prior to surgery    ____ Take 1/2 of usual insulin dose the night before surgery and none on the morning of surgery.   __x__ Take Coumadin until 12/23/15 then stop taking until 1 day after surgery per  Dr. Ramon Dredge office.  ____ Stop Anti-inflammatories on does not apply.  OK to take Tylenol for pain.   __x__ Stop supplements Glucosamine until after surgery.    ____ Bring C-Pap to the hospital.

## 2015-12-22 ENCOUNTER — Encounter: Payer: Self-pay | Admitting: *Deleted

## 2015-12-24 NOTE — Pre-Procedure Instructions (Signed)
[place] 11/25/16

## 2015-12-31 ENCOUNTER — Encounter: Payer: Self-pay | Admitting: *Deleted

## 2015-12-31 ENCOUNTER — Ambulatory Visit
Admission: RE | Admit: 2015-12-31 | Discharge: 2015-12-31 | Disposition: A | Discharge status: Home / Self Care | Payer: PPO | Source: Ambulatory Visit | Attending: Urology | Admitting: Urology

## 2015-12-31 ENCOUNTER — Ambulatory Visit: Payer: PPO | Admitting: *Deleted

## 2015-12-31 ENCOUNTER — Encounter
Admission: RE | Disposition: A | Discharge status: Home / Self Care | Payer: Self-pay | Source: Ambulatory Visit | Attending: Urology

## 2015-12-31 DIAGNOSIS — Z8673 Personal history of transient ischemic attack (TIA), and cerebral infarction without residual deficits: Secondary | ICD-10-CM | POA: Insufficient documentation

## 2015-12-31 DIAGNOSIS — M1990 Unspecified osteoarthritis, unspecified site: Secondary | ICD-10-CM | POA: Diagnosis not present

## 2015-12-31 DIAGNOSIS — Z950 Presence of cardiac pacemaker: Secondary | ICD-10-CM | POA: Diagnosis not present

## 2015-12-31 DIAGNOSIS — R350 Frequency of micturition: Secondary | ICD-10-CM

## 2015-12-31 DIAGNOSIS — M109 Gout, unspecified: Secondary | ICD-10-CM | POA: Diagnosis not present

## 2015-12-31 DIAGNOSIS — D414 Neoplasm of uncertain behavior of bladder: Secondary | ICD-10-CM | POA: Diagnosis not present

## 2015-12-31 DIAGNOSIS — Z6841 Body Mass Index (BMI) 40.0 and over, adult: Secondary | ICD-10-CM | POA: Diagnosis not present

## 2015-12-31 DIAGNOSIS — N3020 Other chronic cystitis without hematuria: Secondary | ICD-10-CM | POA: Diagnosis not present

## 2015-12-31 DIAGNOSIS — Z7901 Long term (current) use of anticoagulants: Secondary | ICD-10-CM | POA: Diagnosis not present

## 2015-12-31 DIAGNOSIS — N329 Bladder disorder, unspecified: Secondary | ICD-10-CM | POA: Diagnosis not present

## 2015-12-31 DIAGNOSIS — Z91040 Latex allergy status: Secondary | ICD-10-CM | POA: Insufficient documentation

## 2015-12-31 DIAGNOSIS — I4891 Unspecified atrial fibrillation: Secondary | ICD-10-CM | POA: Diagnosis not present

## 2015-12-31 DIAGNOSIS — E669 Obesity, unspecified: Secondary | ICD-10-CM | POA: Insufficient documentation

## 2015-12-31 DIAGNOSIS — D494 Neoplasm of unspecified behavior of bladder: Secondary | ICD-10-CM | POA: Diagnosis not present

## 2015-12-31 DIAGNOSIS — N3090 Cystitis, unspecified without hematuria: Secondary | ICD-10-CM | POA: Diagnosis not present

## 2015-12-31 DIAGNOSIS — Z96653 Presence of artificial knee joint, bilateral: Secondary | ICD-10-CM | POA: Insufficient documentation

## 2015-12-31 DIAGNOSIS — Z79899 Other long term (current) drug therapy: Secondary | ICD-10-CM | POA: Insufficient documentation

## 2015-12-31 DIAGNOSIS — I429 Cardiomyopathy, unspecified: Secondary | ICD-10-CM | POA: Diagnosis not present

## 2015-12-31 DIAGNOSIS — Z8546 Personal history of malignant neoplasm of prostate: Secondary | ICD-10-CM | POA: Diagnosis not present

## 2015-12-31 HISTORY — PX: TRANSURETHRAL RESECTION OF BLADDER TUMOR: SHX2575

## 2015-12-31 HISTORY — DX: Cardiac arrhythmia, unspecified: I49.9

## 2015-12-31 LAB — GLUCOSE, CAPILLARY: Glucose-Capillary: 100 mg/dL — ABNORMAL HIGH (ref 65–99)

## 2015-12-31 SURGERY — TURBT (TRANSURETHRAL RESECTION OF BLADDER TUMOR)
Anesthesia: General | Wound class: Clean Contaminated

## 2015-12-31 MED ORDER — CIPROFLOXACIN HCL 500 MG PO TABS: 500.0000 mg | ORAL_TABLET | Freq: Two times a day (BID) | ORAL | Status: DC

## 2015-12-31 MED ORDER — OXYCODONE-ACETAMINOPHEN 5-325 MG PO TABS: 1.0000 | ORAL_TABLET | ORAL | Status: DC | PRN

## 2015-12-31 MED ORDER — DEXAMETHASONE SODIUM PHOSPHATE 10 MG/ML IJ SOLN
INTRAMUSCULAR | Status: DC | PRN
  Administered 2015-12-31: 5 mg via INTRAVENOUS

## 2015-12-31 MED ORDER — GLYCOPYRROLATE 0.2 MG/ML IJ SOLN
INTRAMUSCULAR | Status: DC | PRN
  Administered 2015-12-31: 0.2 mg via INTRAVENOUS

## 2015-12-31 MED ORDER — PHENYLEPHRINE HCL 10 MG/ML IJ SOLN
INTRAMUSCULAR | Status: DC | PRN
  Administered 2015-12-31: 100 ug via INTRAVENOUS

## 2015-12-31 MED ORDER — FENTANYL CITRATE (PF) 100 MCG/2ML IJ SOLN
INTRAMUSCULAR | Status: DC | PRN
  Administered 2015-12-31: 50 ug via INTRAVENOUS

## 2015-12-31 MED ORDER — ESMOLOL HCL 100 MG/10ML IV SOLN
INTRAVENOUS | Status: DC | PRN
  Administered 2015-12-31 (×3): 20 mg via INTRAVENOUS

## 2015-12-31 MED ORDER — FAMOTIDINE 20 MG PO TABS
20.0000 mg | ORAL_TABLET | Freq: Once | ORAL | Status: AC
  Administered 2015-12-31: 20 mg via ORAL

## 2015-12-31 MED ORDER — SODIUM CHLORIDE 0.9 % IV SOLN
INTRAVENOUS | Status: DC
  Administered 2015-12-31: 09:00:00 via INTRAVENOUS

## 2015-12-31 MED ORDER — CEFAZOLIN SODIUM-DEXTROSE 2-3 GM-% IV SOLR
2.0000 g | Freq: Once | INTRAVENOUS | Status: AC
  Administered 2015-12-31: 2 g via INTRAVENOUS

## 2015-12-31 MED ORDER — FAMOTIDINE 20 MG PO TABS
ORAL_TABLET | ORAL | Status: AC
  Administered 2015-12-31: 20 mg via ORAL
  Filled 2015-12-31: qty 1

## 2015-12-31 MED ORDER — ACETAMINOPHEN 10 MG/ML IV SOLN
INTRAVENOUS | Status: AC
  Filled 2015-12-31: qty 100

## 2015-12-31 MED ORDER — HYDROMORPHONE HCL 1 MG/ML IJ SOLN: 0.2500 mg | INTRAMUSCULAR | Status: DC | PRN

## 2015-12-31 MED ORDER — CEFAZOLIN SODIUM-DEXTROSE 2-3 GM-% IV SOLR
INTRAVENOUS | Status: AC
  Administered 2015-12-31: 2 g via INTRAVENOUS
  Filled 2015-12-31: qty 50

## 2015-12-31 MED ORDER — LIDOCAINE HCL (CARDIAC) 20 MG/ML IV SOLN
INTRAVENOUS | Status: DC | PRN
  Administered 2015-12-31: 100 mg via INTRAVENOUS

## 2015-12-31 MED ORDER — PROPOFOL 10 MG/ML IV BOLUS
INTRAVENOUS | Status: DC | PRN
  Administered 2015-12-31: 200 mg via INTRAVENOUS

## 2015-12-31 MED ORDER — ACETAMINOPHEN 10 MG/ML IV SOLN
INTRAVENOUS | Status: DC | PRN
  Administered 2015-12-31: 1000 mg via INTRAVENOUS

## 2015-12-31 MED ORDER — LIDOCAINE HCL 2 % EX GEL
CUTANEOUS | Status: DC | PRN
  Administered 2015-12-31: 1 via TOPICAL

## 2015-12-31 MED ORDER — ONDANSETRON HCL 4 MG/2ML IJ SOLN: 4.0000 mg | Freq: Once | INTRAMUSCULAR | Status: DC | PRN

## 2015-12-31 MED ORDER — MIDAZOLAM HCL 2 MG/2ML IJ SOLN
INTRAMUSCULAR | Status: DC | PRN
  Administered 2015-12-31: 2 mg via INTRAVENOUS

## 2015-12-31 MED ORDER — OXYCODONE-ACETAMINOPHEN 5-325 MG PO TABS
ORAL_TABLET | ORAL | Status: AC
  Administered 2015-12-31: 1
  Filled 2015-12-31: qty 1

## 2015-12-31 SURGICAL SUPPLY — 27 items
BACTOSHIELD CHG 4% 4OZ (MISCELLANEOUS) ×2
BAG DRAIN CYSTO-URO LG1000N (MISCELLANEOUS) ×3 IMPLANT
BAG URO DRAIN 2000ML W/SPOUT (MISCELLANEOUS) ×3 IMPLANT
CATH FOLEY 2WAY  5CC 16FR (CATHETERS) ×2
CATH FOLEY 2WAY 5CC 16FR (CATHETERS) ×1
CATH URTH 16FR FL 2W BLN LF (CATHETERS) ×1 IMPLANT
CORD URO TURP 10FT (MISCELLANEOUS) ×3 IMPLANT
ELECT LOOP MED HF 24F 12D (CUTTING LOOP) ×1 IMPLANT
ELECT REM PT RETURN 9FT ADLT (ELECTROSURGICAL) ×3
ELECTRODE REM PT RTRN 9FT ADLT (ELECTROSURGICAL) ×1 IMPLANT
GLOVE BIO SURGEON STRL SZ7.5 (GLOVE) ×3 IMPLANT
GOWN STRL REUS W/ TWL LRG LVL3 (GOWN DISPOSABLE) ×2 IMPLANT
GOWN STRL REUS W/TWL LRG LVL3 (GOWN DISPOSABLE) ×6
KIT RM TURNOVER CYSTO AR (KITS) ×3 IMPLANT
LOOP CUT RT ANGL 28F (MISCELLANEOUS) ×1 IMPLANT
PACK CYSTO AR (MISCELLANEOUS) ×3 IMPLANT
PREP PVP WINGED SPONGE (MISCELLANEOUS) ×3 IMPLANT
ROLLER BALL 3MM 24FR (ELECTROSURGICAL) ×1 IMPLANT
SCRUB CHG 4% DYNA-HEX 4OZ (MISCELLANEOUS) ×1 IMPLANT
SET IRRIG Y TYPE TUR BLADDER L (SET/KITS/TRAYS/PACK) ×3 IMPLANT
SOL .9 NS 3000ML IRR  AL (IV SOLUTION) ×4
SOL .9 NS 3000ML IRR AL (IV SOLUTION) ×2
SOL .9 NS 3000ML IRR UROMATIC (IV SOLUTION) ×2 IMPLANT
SURGILUBE 2OZ TUBE FLIPTOP (MISCELLANEOUS) ×3 IMPLANT
SYRINGE IRR TOOMEY STRL 70CC (SYRINGE) ×3 IMPLANT
WATER STERILE IRR 1000ML POUR (IV SOLUTION) ×3 IMPLANT
WATER STERILE IRR 3000ML UROMA (IV SOLUTION) ×3 IMPLANT

## 2015-12-31 NOTE — Anesthesia Postprocedure Evaluation (Signed)
Anesthesia Post Note  Patient: Jeremy Carney  Procedure(s) Performed: Procedure(s) (LRB): BLADDER BIOPSY (N/A)  Patient location during evaluation: PACU Anesthesia Type: General Level of consciousness: awake Pain management: pain level controlled Vital Signs Assessment: post-procedure vital signs reviewed and stable Respiratory status: spontaneous breathing Cardiovascular status: blood pressure returned to baseline Postop Assessment: no headache Anesthetic complications: no    Last Vitals:  Filed Vitals:   12/31/15 0955 12/31/15 1008  BP: 97/78 108/79  Pulse: 28 44  Temp:    Resp: 16 15    Last Pain:  Filed Vitals:   12/31/15 1011  PainSc: 0-No pain                 Velera Lansdale M

## 2015-12-31 NOTE — Anesthesia Preprocedure Evaluation (Addendum)
Anesthesia Evaluation  Patient identified by MRN, date of birth, ID band Patient awake    Reviewed: Allergy & Precautions, NPO status , Patient's Chart, lab work & pertinent test results  Airway Mallampati: III  TM Distance: >3 FB Neck ROM: Limited    Dental  (+) Teeth Intact   Pulmonary    breath sounds clear to auscultation       Cardiovascular Exercise Tolerance: Poor + dysrhythmias Atrial Fibrillation  Rate:Normal     Neuro/Psych CVA, Residual Symptoms    GI/Hepatic   Endo/Other    Renal/GU      Musculoskeletal  (+) Arthritis , Osteoarthritis,    Abdominal (+) + obese,   Peds  Hematology   Anesthesia Other Findings   Reproductive/Obstetrics                            Anesthesia Physical Anesthesia Plan  ASA: III  Anesthesia Plan: General   Post-op Pain Management:    Induction: Intravenous  Airway Management Planned: LMA  Additional Equipment:   Intra-op Plan:   Post-operative Plan: Extubation in OR  Informed Consent: I have reviewed the patients History and Physical, chart, labs and discussed the procedure including the risks, benefits and alternatives for the proposed anesthesia with the patient or authorized representative who has indicated his/her understanding and acceptance.     Plan Discussed with: CRNA  Anesthesia Plan Comments:         Anesthesia Quick Evaluation

## 2015-12-31 NOTE — Anesthesia Procedure Notes (Signed)
Procedure Name: LMA Insertion Date/Time: 12/31/2015 9:06 AM Performed by: Doreen Salvage Pre-anesthesia Checklist: Patient identified, Patient being monitored, Timeout performed, Emergency Drugs available and Suction available Patient Re-evaluated:Patient Re-evaluated prior to inductionOxygen Delivery Method: Circle system utilized Preoxygenation: Pre-oxygenation with 100% oxygen Intubation Type: IV induction Ventilation: Mask ventilation without difficulty LMA: LMA inserted LMA Size: 5.0 Tube type: Oral Number of attempts: 1 Placement Confirmation: positive ETCO2 and breath sounds checked- equal and bilateral Tube secured with: Tape Dental Injury: Teeth and Oropharynx as per pre-operative assessment

## 2015-12-31 NOTE — H&P (Signed)
Expand All Collapse All     11/26/2015 2:44 PM   Jeremy Carney 23-Apr-1946 [id number]  Referring provider: Rusty Aus, MD [facility] [place], [place] [address]  Chief Complaint  Patient presents with  . Cysto    HPI: The patient is a 70 year old gentleman with a radical prostatectomy 1999 with biochemical recurrence in 2007 treated with IMRT. He had an orchiectomy in 2010 after his PSA began to rise again. He chose this option of Lupron. He was completely incontinent after his prostatectomy and AUS was placed. The patient states that his AUS works well. He notes no leakage at this time. His last PSA is available to me 0.35 in August 2015.    PMH: Past Medical History  Diagnosis Date  . Atrial fibrillation ([place]) 10/30/2015  . Cardiomyopathy ([place]) 09/10/2014    Overview: EF 35%,4/15   . Sinoatrial node dysfunction (HCC) 10/30/2015    Overview: DDD pacemaker, 1996   . Cerebrovascular accident (CVA) ([place]) 10/30/2015    Overview: Left embolic [id number]   . Arthritis, degenerative 10/30/2015  . Malignant neoplasm of prostate ([place]) 09/05/2012  . Carcinoma of prostate ([place]) 10/30/2015  . Bladder spasm 06/12/2013  . Difficult or painful urination 11/21/2013  . Gout 10/30/2015    Surgical History: Past Surgical History  Procedure Laterality Date  . Urinary incontinence valve      AMS  . Replacement total knee Bilateral   . Prostatectomy    . Castration  10/2009  . Left knee replacement  2014  . Inguinal hernia repair Left   . Tonsillectomy and adenoidectomy    . Pacemaker insertion      Home Medications:    Medication List       This list is accurate as of: 11/26/15 2:44 PM. Always use your most recent med list.              allopurinol 300 MG tablet  Commonly known as: ZYLOPRIM  Take by mouth.     Glucosamine  Sulfate 1000 MG Caps  Take by mouth.     hydrochlorothiazide 25 MG tablet  Commonly known as: HYDRODIURIL  Take by mouth.     omeprazole 40 MG capsule  Commonly known as: PRILOSEC     oxyCODONE-acetaminophen 5-325 MG tablet  Commonly known as: PERCOCET/ROXICET  Take by mouth.     potassium citrate 10 MEQ (1080 MG) SR tablet  Commonly known as: UROCIT-K     simvastatin 40 MG tablet  Commonly known as: ZOCOR  Take by mouth.     warfarin 5 MG tablet  Commonly known as: COUMADIN  TAKE ONE (1) TABLET EACH DAY        Allergies:  Allergies  Allergen Reactions  . Latex Rash    Sensitivity, not allergy.    Family History: Family History  Problem Relation Age of Onset  . Prostate cancer Neg Hx   . Bladder Cancer Neg Hx     Social History:  reports that he has never smoked. He does not have any smokeless tobacco history on file. He reports that he does not drink alcohol or use illicit drugs.  ROS:                                        Physical Exam: BP 153/97 mmHg  Pulse 97  Ht 6' (1.829 m)  Wt 304  lb 8 oz (138.12 kg)  BMI 41.29 kg/m2  Constitutional: Alert and oriented, No acute distress. HEENT: [place] AT, moist mucus membranes. Trachea midline, no masses. Cardiovascular: No clubbing, cyanosis, or edema. Respiratory: Normal respiratory effort, no increased work of breathing. GI: Abdomen is soft, nontender, nondistended, no abdominal masses GU: No CVA tenderness. Normal phallus. Testicles surgically absent. AUS palpable. Skin: No rashes, bruises or suspicious lesions. Lymph: No cervical or inguinal adenopathy. Neurologic: Grossly intact, no focal deficits, moving all 4 extremities. Psychiatric: Normal mood and affect.  Laboratory Data:  Recent Labs    Lab Results  Component Value Date   WBC 3.4* 06/07/2013   HGB 8.9* 09/07/2013   HCT 36.2* 06/07/2013   MCV  92 06/07/2013   PLT 73* 09/07/2013       Recent Labs    Lab Results  Component Value Date   CREATININE 1.01 09/07/2013       Recent Labs    Lab Results  Component Value Date   PSA 2.2 06/07/2013   PSA 3.6 01/05/2013       Recent Labs    No results found for: TESTOSTERONE     Recent Labs    No results found for: HGBA1C    Urinalysis  Labs (Brief)       Component Value Date/Time   GLUCOSEU Negative 10/30/2015 1451   BILIRUBINUR Negative 10/30/2015 1451   NITRITE Negative 10/30/2015 1451   LEUKOCYTESUR 2+* 10/30/2015 1451         Cystoscopy Procedure Note  Patient identification was confirmed, informed consent was obtained, and patient was prepped using Betadine solution. Lidocaine jelly was administered per urethral meatus.   Preoperative abx where received prior to procedure.    Pre-Procedure: - Inspection reveals a normal caliber ureteral meatus.  Procedure: The patient's AUS was deactivated. The flexible cystoscope was introduced without difficulty - No urethral strictures/lesions are present. -No evidence of erosion of his AUS - Bilateral ureteral orifices identified - No bladder stones - No trabeculation  There were hypervascular nodules in the posterior floor of the bladder.   Post-Procedure: - Patient tolerated the procedure well  Assessment & Plan:   1. Bladder tumor -TURBT next  2. History of prostate cancer s/p prostatectomy, salvage IMRT, orchiectomy Check PSA today   3. Incontinence Managed well with his [place], MD  [facility] [address], [place], [place] [address] [phone]

## 2015-12-31 NOTE — Discharge Instructions (Signed)

## 2015-12-31 NOTE — Transfer of Care (Signed)
Immediate Anesthesia Transfer of Care Note  Patient: Jeremy Carney  Procedure(s) Performed: Procedure(s): BLADDER BIOPSY (N/A)  Patient Location: PACU  Anesthesia Type:General  Level of Consciousness: sedated  Airway & Oxygen Therapy: Patient Spontanous Breathing and Patient connected to face mask oxygen  Post-op Assessment: Report given to RN and Post -op Vital signs reviewed and stable  Post vital signs: Reviewed and stable  Last Vitals:  Filed Vitals:   12/31/15 0823 12/31/15 0939  BP: 138/79 104/59  Pulse: 93 87  Temp: 36.7 C 36.3 C  Resp: 18 12    Complications: No apparent anesthesia complications

## 2015-12-31 NOTE — Op Note (Signed)
Date of procedure: 12/31/2015  Preoperative diagnosis:  1. Bladder lesion   Postoperative diagnosis:  1. Bladder lesion   Procedure: 1. Cystoscopy 2. Bladder biopsy 3 3. Fulguration of bleeders  Surgeon: Baruch Gouty, MD  Anesthesia: General  Complications: None  Intraoperative findings: A hypervascular flat lesion in the posterior wall of the bladder  EBL: None  Specimens: Bladder biopsy 3  Drains: None  Disposition: Stable to the postanesthesia care unit  Indication for procedure: The patient is a 70 y.o. male with history of prostate cancer status post prostatectomy with salvage radiation and urinary incontinence status post artificial urinary sphincter who was found to have a hypervascular lesion in the posterior wall of the bladder who presents for biopsy today.  After reviewing the management options for treatment, the patient elected to proceed with the above surgical procedure(s). We have discussed the potential benefits and risks of the procedure, side effects of the proposed treatment, the likelihood of the patient achieving the goals of the procedure, and any potential problems that might occur during the procedure or recuperation. Informed consent has been obtained.  Description of procedure: The patient was met in the preoperative area. All risks, benefits, and indications of the procedure were described in great detail. The patient consented to the procedure. Preoperative antibiotics were given. The patient was taken to the operative theater. General anesthesia was induced per the anesthesia service. The patient was then placed in the dorsal lithotomy position and prepped and draped in the usual sterile fashion. A preoperative timeout was called. The patient's artificial urinary sphincter was deactivated. A 21 French 30 cystoscope was inserted into the patient's bladder per urethra atraumatically. The prostate was surgically absent. Pan cystoscopy was unremarkable  except for a hypervascular erythematous lesion on the posterior wall of the bladder. Approximately 37 was incised. It was biopsied 3. The biopsy site was fulgurated with excellent hemostasis. The lesion was completely fulgurated in entirety. the patient's bladder was then drained and cystoscope removed. His artificial urinary sphincter was reactivated. He was awoken from anesthesia and transferred stable condition to postanesthesia care unit.  Plan: Follow-up 12 weeks for pathology results. He will need a repeat PSA in one year. His PSA is currently stable at 0.5.   Baruch Gouty, M.D.

## 2016-01-01 ENCOUNTER — Ambulatory Visit (INDEPENDENT_AMBULATORY_CARE_PROVIDER_SITE_OTHER): Payer: PPO | Admitting: Urology

## 2016-01-01 ENCOUNTER — Telehealth: Payer: Self-pay

## 2016-01-01 DIAGNOSIS — N329 Bladder disorder, unspecified: Secondary | ICD-10-CM

## 2016-01-01 LAB — SURGICAL PATHOLOGY

## 2016-01-01 NOTE — Telephone Encounter (Signed)
-----   Message from Nickie Retort, MD sent at 12/31/2015  9:36 AM EST ----- Patient needs to f/u in 1-2 weeks for pathology results

## 2016-01-01 NOTE — Progress Notes (Signed)
Patient presents to clinic today because his AUS was not activated in the operating room yesterday.  Patient's pain was actually in the office today. It was easily cycled without difficulty. Follow-up as previously scheduled for pathology results.

## 2016-01-08 ENCOUNTER — Ambulatory Visit (INDEPENDENT_AMBULATORY_CARE_PROVIDER_SITE_OTHER): Payer: PPO | Admitting: Urology

## 2016-01-08 VITALS — BP 126/93 | HR 103 | Ht 72.0 in | Wt 310.0 lb

## 2016-01-08 DIAGNOSIS — R32 Unspecified urinary incontinence: Secondary | ICD-10-CM

## 2016-01-08 DIAGNOSIS — Z8546 Personal history of malignant neoplasm of prostate: Secondary | ICD-10-CM

## 2016-01-08 DIAGNOSIS — N329 Bladder disorder, unspecified: Secondary | ICD-10-CM

## 2016-01-08 LAB — URINALYSIS, COMPLETE
BILIRUBIN UA: NEGATIVE
Glucose, UA: NEGATIVE
Nitrite, UA: NEGATIVE
SPEC GRAV UA: 1.025 (ref 1.005–1.030)
Urobilinogen, Ur: 0.2 mg/dL (ref 0.2–1.0)
pH, UA: 5.5 (ref 5.0–7.5)

## 2016-01-08 LAB — MICROSCOPIC EXAMINATION

## 2016-01-08 NOTE — Progress Notes (Signed)
01/08/2016 12:06 PM   Jeremy Carney [birth year] [id number]  Referring provider: Rusty Aus, MD [place] [facility] [place], [place] [address]  Chief Complaint  Patient presents with  . Routine Post Op    HPI: The patient is a 71 year old gentleman with a radical prostatectomy 1999 with biochemical recurrence in 2007 treated with IMRT. He had an orchiectomy in 2010 after his PSA began to rise again. He chose this option of Lupron. He was completely incontinent after his prostatectomy and AUS was placed. The patient states that his AUS works well. He notes no leakage at this time. His last PSA is available to me 0.35 in August 2015. 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 He came back as chronic irritation but benign.   PMH: Past Medical History  Diagnosis Date  . Atrial fibrillation ([place]) 10/30/2015  . Cardiomyopathy ([place]) 09/10/2014    Overview:  EF 35%,4/15   . Sinoatrial node dysfunction (HCC) 10/30/2015    Overview:  DDD pacemaker, 1996   . Arthritis, degenerative 10/30/2015  . Bladder spasm 06/12/2013  . Difficult or painful urination 11/21/2013  . Gout 10/30/2015  . Cerebrovascular accident (CVA) ([place]) 10/30/2015    Overview:  Left embolic [id number]   . Malignant neoplasm of prostate ([place]) 09/05/2012  . Carcinoma of prostate ([place]) 10/30/2015  . Dysrhythmia     Surgical History: Past Surgical History  Procedure Laterality Date  . Urinary incontinence valve      AMS  . Replacement total knee Bilateral   . Prostatectomy    . Castration  10/2009  . Left knee replacement  2014  . Inguinal hernia repair Left   . Tonsillectomy and adenoidectomy    . Pacemaker insertion    . Joint replacement Bilateral 1980    hips  . Cataract extraction w/ intraocular lens  implant, bilateral Bilateral 2016    right eye done then the left one done 3 months later in 2016  . Insert / replace / remove pacemaker    .  Transurethral resection of bladder tumor N/A 12/31/2015    Procedure: BLADDER BIOPSY;  Surgeon: Nickie Retort, MD;  Location: [facility] ORS;  Service: Urology;  Laterality: N/A;    Home Medications:    Medication List       This list is accurate as of: 01/08/16 12:06 PM.  Always use your most recent med list.               allopurinol 300 MG tablet  Commonly known as:  ZYLOPRIM  Take by mouth daily. In am.     Glucosamine Sulfate 1000 MG Caps  Take 1 capsule by mouth 2 (two) times daily.     hydrochlorothiazide 25 MG tablet  Commonly known as:  HYDRODIURIL  Take 25 mg by mouth. In am     simvastatin 40 MG tablet  Commonly known as:  ZOCOR  Take 40 mg by mouth daily at 6 PM.     warfarin 2 MG tablet  Commonly known as:  COUMADIN  Take 2 mg by mouth once a week. On saturdays.     warfarin 5 MG tablet  Commonly known as:  COUMADIN  TAKE ONE (1) TABLET EACH DAY        Allergies:  Allergies  Allergen Reactions  . Latex Rash    Sensitivity, not allergy.    Family History: Family History  Problem Relation Age of Onset  . Prostate  cancer Neg Hx   . Bladder Cancer Neg Hx     Social History:  reports that he has never smoked. He does not have any smokeless tobacco history on file. He reports that he does not drink alcohol or use illicit drugs.  ROS:                                        Physical Exam: BP 126/93 mmHg  Pulse 103  Ht 6' (1.829 m)  Wt 310 lb (140.615 kg)  BMI 42.03 kg/m2  Constitutional:  Alert and oriented, No acute distress. HEENT: [place] AT, moist mucus membranes.  Trachea midline, no masses. Cardiovascular: No clubbing, cyanosis, or edema. Respiratory: Normal respiratory effort, no increased work of breathing. GI: Abdomen is soft, nontender, nondistended, no abdominal masses GU: No CVA tenderness.  Skin: No rashes, bruises or suspicious lesions. Lymph: No cervical or inguinal adenopathy. Neurologic: Grossly intact, no  focal deficits, moving all 4 extremities. Psychiatric: Normal mood and affect.  Laboratory Data: Lab Results  Component Value Date   WBC 4.8 12/19/2015   HGB 13.4 12/19/2015   HCT 39.1* 12/19/2015   MCV 89.1 12/19/2015   PLT 126* 12/19/2015    Lab Results  Component Value Date   CREATININE 0.77 12/19/2015    Lab Results  Component Value Date   PSA 2.2 06/07/2013   PSA 3.6 01/05/2013    No results found for: TESTOSTERONE  No results found for: HGBA1C  Urinalysis    Component Value Date/Time   GLUCOSEU Negative 11/26/2015 1436   BILIRUBINUR Negative 11/26/2015 1436   NITRITE Negative 11/26/2015 1436   LEUKOCYTESUR Negative 11/26/2015 1436   DIAGNOSIS:  A. BLADDER; BIOPSY:  - MILD CHRONIC INFLAMMATION WITH SUBEPITHELIAL HEMOSIDERIN DEPOSITION,  EDEMA, AND INCREASED VASCULARITY.  - NEGATIVE FOR UROTHELIAL ATYPIA AND MALIGNANCY.   Assessment & Plan:    1. Bladder tumor -Negative biopsy. Chronic inflammation  2. History of prostate cancer s/p prostatectomy, salvage IMRT, orchiectomy -PSA stable. Recheck in one year  3. Incontinence Managed well with his AUS  No Follow-up on file.  Nickie Retort, MD  [facility] [address] [place], [place] [address] [phone]

## 2016-02-17 DIAGNOSIS — I482 Chronic atrial fibrillation: Secondary | ICD-10-CM | POA: Diagnosis not present

## 2016-02-24 DIAGNOSIS — I480 Paroxysmal atrial fibrillation: Secondary | ICD-10-CM | POA: Diagnosis not present

## 2016-05-20 DIAGNOSIS — I482 Chronic atrial fibrillation: Secondary | ICD-10-CM | POA: Diagnosis not present

## 2016-09-20 DIAGNOSIS — Z125 Encounter for screening for malignant neoplasm of prostate: Secondary | ICD-10-CM | POA: Diagnosis not present

## 2016-09-20 DIAGNOSIS — Z0000 Encounter for general adult medical examination without abnormal findings: Secondary | ICD-10-CM | POA: Diagnosis not present

## 2016-09-20 DIAGNOSIS — I480 Paroxysmal atrial fibrillation: Secondary | ICD-10-CM | POA: Diagnosis not present

## 2016-09-20 DIAGNOSIS — M1A9XX0 Chronic gout, unspecified, without tophus (tophi): Secondary | ICD-10-CM | POA: Diagnosis not present

## 2016-09-20 DIAGNOSIS — E782 Mixed hyperlipidemia: Secondary | ICD-10-CM | POA: Diagnosis not present

## 2016-09-21 DIAGNOSIS — I480 Paroxysmal atrial fibrillation: Secondary | ICD-10-CM | POA: Diagnosis not present

## 2016-09-30 DIAGNOSIS — Z0000 Encounter for general adult medical examination without abnormal findings: Secondary | ICD-10-CM | POA: Diagnosis not present

## 2016-12-31 DIAGNOSIS — I4891 Unspecified atrial fibrillation: Secondary | ICD-10-CM | POA: Diagnosis not present

## 2017-01-06 ENCOUNTER — Other Ambulatory Visit: Payer: Self-pay

## 2017-01-06 ENCOUNTER — Other Ambulatory Visit: Payer: PPO

## 2017-01-06 DIAGNOSIS — Z8546 Personal history of malignant neoplasm of prostate: Secondary | ICD-10-CM | POA: Diagnosis not present

## 2017-01-07 ENCOUNTER — Other Ambulatory Visit: Payer: PPO

## 2017-01-07 ENCOUNTER — Telehealth: Payer: Self-pay

## 2017-01-07 LAB — PSA: PROSTATE SPECIFIC AG, SERUM: 3.9 ng/mL (ref 0.0–4.0)

## 2017-01-07 NOTE — Telephone Encounter (Signed)
No answer

## 2017-01-07 NOTE — Telephone Encounter (Signed)
-----   Message from Nori Riis, PA-C sent at 01/07/2017  8:03 AM EST ----- Patient's PSA has increased.  He needs to keep his upcoming appointment with Dr. Pilar Jarvis on the 30th.

## 2017-01-10 NOTE — Telephone Encounter (Signed)
LMOM

## 2017-01-11 ENCOUNTER — Ambulatory Visit (INDEPENDENT_AMBULATORY_CARE_PROVIDER_SITE_OTHER): Payer: PPO | Admitting: Urology

## 2017-01-11 ENCOUNTER — Encounter: Payer: Self-pay | Admitting: Urology

## 2017-01-11 VITALS — BP 117/72 | HR 109 | Ht 72.0 in | Wt 295.6 lb

## 2017-01-11 DIAGNOSIS — R350 Frequency of micturition: Secondary | ICD-10-CM | POA: Diagnosis not present

## 2017-01-11 NOTE — Telephone Encounter (Signed)
Pt is present for f/u appt now. Will make pt aware.

## 2017-01-11 NOTE — Progress Notes (Signed)
01/11/2017 2:24 PM   Jeremy Carney 1946/04/08 [id number]  Referring provider: Rusty Aus, MD [place] [facility] [place], [place] [address]  Chief Complaint  Patient presents with  . Elevated PSA    HPI: The patient is a 71 year old gentleman with a radical prostatectomy 1999 with biochemical recurrence in 2007 treated with IMRT. He had an orchiectomy in 2010 after his PSA began to rise again. He chose this option of Lupron. He was completely incontinent after his prostatectomy and AUS was placed. The patient states that his AUS works well. He notes no leakage at this time. His last PSA is available to me 0.35 in August 2015. 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 He came back as chronic irritation but benign.  PSA history 3.6 on 12/2012 2.2 on 05/2013 0.5 on 11/2015 3.9 on 12/2016  January 2017: The patient had cystoscopy with a bladder biopsy which was negative.  Interval: The patient presents today with concerns of development of a new bladder stone. I guess the past he had a cystolitholapaxy. He has a history of prostate cancer and has recently had an elevated PSA. His symptoms today include a splayed stream which has occurred 4 times in the last week. He denies any voiding symptoms including dysuria. He denies any gross hematuria. He denies any fevers or chills. He denies any tooth pain.  PMH: Past Medical History:  Diagnosis Date  . Arthritis, degenerative 10/30/2015  . Atrial fibrillation ([place]) 10/30/2015  . Bladder spasm 06/12/2013  . Carcinoma of prostate ([place]) 10/30/2015  . Cardiomyopathy ([place]) 09/10/2014   Overview:  EF 35%,4/15   . Cerebrovascular accident (CVA) ([place]) 10/30/2015   Overview:  Left embolic [id number]   . Difficult or painful urination 11/21/2013  . Dysrhythmia   . Gout 10/30/2015  . Malignant neoplasm of prostate ([place]) 09/05/2012  . Sinoatrial node dysfunction (HCC) 10/30/2015   Overview:  DDD pacemaker, 1996     Surgical History: Past Surgical History:  Procedure Laterality Date  . castration  10/2009  . CATARACT EXTRACTION W/ INTRAOCULAR LENS  IMPLANT, BILATERAL Bilateral 2016   right eye done then the left one done 3 months later in 2016  . INGUINAL HERNIA REPAIR Left   . INSERT / REPLACE / REMOVE PACEMAKER    . JOINT REPLACEMENT Bilateral 1980   hips  . left knee replacement  2014  . PACEMAKER INSERTION    . PROSTATECTOMY    . REPLACEMENT TOTAL KNEE Bilateral   . TONSILLECTOMY AND ADENOIDECTOMY    . TRANSURETHRAL RESECTION OF BLADDER TUMOR N/A 12/31/2015   Procedure: BLADDER BIOPSY;  Surgeon: Nickie Retort, MD;  Location: [facility] ORS;  Service: Urology;  Laterality: N/A;  . Urinary incontinence valve     AMS    Home Medications:  Allergies as of 01/11/2017      Reactions   Latex Rash   Sensitivity, not allergy.      Medication List       Accurate as of 01/11/17  2:24 PM. Always use your most recent med list.          allopurinol 300 MG tablet Commonly known as:  ZYLOPRIM Take by mouth daily. In am.   Glucosamine Sulfate 1000 MG Caps Take 1 capsule by mouth 2 (two) times daily.   hydrochlorothiazide 25 MG tablet Commonly known as:  HYDRODIURIL Take 25 mg by mouth. In am   phentermine 15 MG capsule Take 15 mg  by mouth every morning.   simvastatin 40 MG tablet Commonly known as:  ZOCOR Take 40 mg by mouth daily at 6 PM.   warfarin 2 MG tablet Commonly known as:  COUMADIN Take 2 mg by mouth once a week. On saturdays.   warfarin 5 MG tablet Commonly known as:  COUMADIN TAKE ONE (1) TABLET EACH DAY       Allergies:  Allergies  Allergen Reactions  . Latex Rash    Sensitivity, not allergy.    Family History: Family History  Problem Relation Age of Onset  . Prostate cancer Neg Hx   . Bladder Cancer Neg Hx     Social History:  reports that he has never smoked. He has never used smokeless tobacco. He reports that he  does not drink alcohol or use drugs.  ROS: UROLOGY Frequent Urination?: Yes Hard to postpone urination?: No Burning/pain with urination?: No Get up at night to urinate?: Yes Leakage of urine?: Yes Urine stream starts and stops?: No Trouble starting stream?: No Do you have to strain to urinate?: No Blood in urine?: No Urinary tract infection?: No Sexually transmitted disease?: No Injury to kidneys or bladder?: No Painful intercourse?: No Weak stream?: No Erection problems?: No Penile pain?: No  Gastrointestinal Nausea?: No Vomiting?: No Indigestion/heartburn?: No Diarrhea?: No Constipation?: No  Constitutional Fever: No Night sweats?: No Weight loss?: No Fatigue?: No  Skin Skin rash/lesions?: No Itching?: No  Eyes Blurred vision?: No Double vision?: No  Ears/Nose/Throat Sore throat?: No Sinus problems?: No  Hematologic/Lymphatic Swollen glands?: No Easy bruising?: No  Cardiovascular Leg swelling?: No Chest pain?: No  Respiratory Cough?: No Shortness of breath?: No  Endocrine Excessive thirst?: No  Musculoskeletal Back pain?: No Joint pain?: No  Neurological Headaches?: No Dizziness?: No  Psychologic Depression?: No Anxiety?: No  Physical Exam: BP 117/72 (BP Location: Left Arm, Patient Position: Sitting, Cuff Size: Large)   Pulse (!) 109   Ht 6' (1.829 m)   Wt 134.1 kg (295 lb 9.6 oz)   BMI 40.09 kg/m   Constitutional:  Alert and oriented, No acute distress. HEENT: [place] AT, moist mucus membranes.  Trachea midline, no masses. Cardiovascular: No clubbing, cyanosis, or edema. Respiratory: Normal respiratory effort, no increased work of breathing. GI: Abdomen is soft, nontender, nondistended, no abdominal masses GU: No CVA tenderness.  Skin: No rashes, bruises or suspicious lesions. Lymph: No cervical or inguinal adenopathy. Neurologic: Grossly intact, no focal deficits, moving all 4 extremities. Psychiatric: Normal mood and  affect.  Laboratory Data: Lab Results  Component Value Date   WBC 4.8 12/19/2015   HGB 13.4 12/19/2015   HCT 39.1 (L) 12/19/2015   MCV 89.1 12/19/2015   PLT 126 (L) 12/19/2015    Lab Results  Component Value Date   CREATININE 0.77 12/19/2015    Lab Results  Component Value Date   PSA 2.2 06/07/2013   PSA 3.6 01/05/2013    No results found for: TESTOSTERONE  No results found for: HGBA1C  Urinalysis    Component Value Date/Time   APPEARANCEUR Clear 01/08/2016 1144   GLUCOSEU Negative 01/08/2016 1144   BILIRUBINUR Negative 01/08/2016 1144   PROTEINUR 2+ (A) 01/08/2016 1144   NITRITE Negative 01/08/2016 1144   LEUKOCYTESUR Trace (A) 01/08/2016 1144      Assessment & Plan:   The patient has intermittent splayed stream without any other significant symptoms. He does have a history of bladder stones, this could be the etiology of symptoms, in addition, the patient has  microscopic hematuria (negative bladder biopsy in 2017) and may benefit from cystoscopy in the future. We did talk about treatment options for bladder stones and at this point the patient is otherwise asymptomatic. As such, we'll plan to follow this up in 3 months to see if his symptoms have progressed.  The patient also has biochemical recurrent prostate cancer, castrate resistant. His PSA is 3.9 currently, we will plan to repeat this in 3 months and discuss any additional treatment options her management strategies at that point if necessary.  No Follow-up on file.  Ardis Hughs, [facility] [address], [place], [place] [address] [phone]

## 2017-01-11 NOTE — Addendum Note (Signed)
Addended by: Kyra Manges on: 01/11/2017 04:57 PM   Modules accepted: Orders

## 2017-01-12 LAB — URINALYSIS, COMPLETE
Bilirubin, UA: NEGATIVE
GLUCOSE, UA: NEGATIVE
KETONES UA: NEGATIVE
LEUKOCYTES UA: NEGATIVE
Nitrite, UA: NEGATIVE
Protein, UA: NEGATIVE
SPEC GRAV UA: 1.025 (ref 1.005–1.030)
Urobilinogen, Ur: 0.2 mg/dL (ref 0.2–1.0)
pH, UA: 5 (ref 5.0–7.5)

## 2017-01-12 LAB — MICROSCOPIC EXAMINATION: Epithelial Cells (non renal): >10 /hpf — AB (ref 0–10)

## 2017-02-01 DIAGNOSIS — Z0000 Encounter for general adult medical examination without abnormal findings: Secondary | ICD-10-CM | POA: Diagnosis not present

## 2017-02-01 DIAGNOSIS — I482 Chronic atrial fibrillation: Secondary | ICD-10-CM | POA: Diagnosis not present

## 2017-02-01 DIAGNOSIS — I420 Dilated cardiomyopathy: Secondary | ICD-10-CM | POA: Diagnosis not present

## 2017-02-01 DIAGNOSIS — I63019 Cerebral infarction due to thrombosis of unspecified vertebral artery: Secondary | ICD-10-CM | POA: Diagnosis not present

## 2017-03-22 DIAGNOSIS — I480 Paroxysmal atrial fibrillation: Secondary | ICD-10-CM | POA: Diagnosis not present

## 2017-04-07 ENCOUNTER — Other Ambulatory Visit: Payer: PPO

## 2017-04-07 DIAGNOSIS — R350 Frequency of micturition: Secondary | ICD-10-CM

## 2017-04-08 ENCOUNTER — Other Ambulatory Visit: Payer: PPO

## 2017-04-08 LAB — PSA: PROSTATE SPECIFIC AG, SERUM: 7.8 ng/mL — AB (ref 0.0–4.0)

## 2017-04-12 ENCOUNTER — Encounter: Payer: Self-pay | Admitting: Urology

## 2017-04-12 ENCOUNTER — Ambulatory Visit: Payer: PPO | Admitting: Urology

## 2017-04-12 VITALS — BP 134/73 | HR 60 | Ht 72.0 in | Wt 278.5 lb

## 2017-04-12 DIAGNOSIS — N393 Stress incontinence (female) (male): Secondary | ICD-10-CM

## 2017-04-12 DIAGNOSIS — N3040 Irradiation cystitis without hematuria: Secondary | ICD-10-CM | POA: Diagnosis not present

## 2017-04-12 DIAGNOSIS — C61 Malignant neoplasm of prostate: Secondary | ICD-10-CM

## 2017-04-12 NOTE — Progress Notes (Signed)
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 Recent History: 2017 - PSA 0.5 12/2016 - PSA 3.0 03/2017 - PSA 7.8  2. Stress urinary incontinence - artificial urniary sphincter placed prevoiusliy and working well.    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 Now minimal bother.   Today "Jeremy Carney" is seen in f/u abvoe. His PSA is rising quickly c/w metastatic disease. No recent staging imaging.    PMH: Past Medical History:  Diagnosis Date  . Arthritis, degenerative 10/30/2015  . Atrial fibrillation ([place]) 10/30/2015  . Bladder spasm 06/12/2013  . Carcinoma of prostate ([place]) 10/30/2015  . Cardiomyopathy ([place]) 09/10/2014   Overview:  EF 35%,4/15   . Cerebrovascular accident (CVA) ([place]) 10/30/2015   Overview:  Left embolic [id number]   . Difficult or painful urination 11/21/2013  . Dysrhythmia   . Gout 10/30/2015  . Malignant neoplasm of prostate ([place]) 09/05/2012  . Sinoatrial node dysfunction (HCC) 10/30/2015   Overview:  DDD pacemaker, 1996     Surgical History: Past Surgical History:  Procedure Laterality Date  . castration  10/2009  . CATARACT EXTRACTION W/ INTRAOCULAR LENS  IMPLANT, BILATERAL Bilateral 2016   right eye done then the left one done 3 months later in 2016  . INGUINAL HERNIA REPAIR Left   . INSERT / REPLACE / REMOVE PACEMAKER    . JOINT REPLACEMENT Bilateral 1980   hips  . left knee replacement  2014  . PACEMAKER INSERTION    . PROSTATECTOMY    . REPLACEMENT TOTAL KNEE Bilateral   . TONSILLECTOMY AND ADENOIDECTOMY    . TRANSURETHRAL RESECTION OF BLADDER TUMOR N/A 12/31/2015   Procedure: BLADDER BIOPSY;   Surgeon: Nickie Retort, MD;  Location: [facility] ORS;  Service: Urology;  Laterality: N/A;  . Urinary incontinence valve     AMS    Home Medications:  Allergies as of 04/12/2017      Reactions   Latex Rash   Sensitivity, not allergy.      Medication List       Accurate as of 04/12/17  9:40 AM. Always use your most recent med list.          allopurinol 300 MG tablet Commonly known as:  ZYLOPRIM Take by mouth daily. In am.   Glucosamine Sulfate 1000 MG Caps Take 1 capsule by mouth 2 (two) times daily.   hydrochlorothiazide 25 MG tablet Commonly known as:  HYDRODIURIL Take 25 mg by mouth. In am   phentermine 15 MG capsule Take 15 mg by mouth every morning.   simvastatin 40 MG tablet Commonly known as:  ZOCOR Take 40 mg by mouth daily at 6 PM.   warfarin 2 MG tablet Commonly known as:  COUMADIN Take 2 mg by mouth once a week. On saturdays.   warfarin 5 MG tablet Commonly known as:  COUMADIN TAKE ONE (1) TABLET EACH DAY       Allergies:  Allergies  Allergen Reactions  . Latex Rash    Sensitivity, not allergy.    Family History: Family History  Problem Relation Age of Onset  . Prostate cancer Neg Hx   . Bladder Cancer Neg Hx  Social History:  reports that he has never smoked. He has never used smokeless tobacco. He reports that he does not drink alcohol or use drugs.      Review of Systems  Gastrointestinal (upper)  : Negative for upper GI symptoms  Gastrointestinal (lower) : Negative for lower GI symptoms  Constitutional : Negative for symptoms  Skin: Negative for skin symptoms  Eyes: Negative for eye symptoms  Ear/Nose/Throat : Negative for Ear/Nose/Throat symptoms  Hematologic/Lymphatic: Negative for Hematologic/Lymphatic symptoms  Cardiovascular : Negative for cardiovascular symptoms  Respiratory : Negative for respiratory symptoms  Endocrine: Negative for endocrine symptoms  Musculoskeletal: Negative for  musculoskeletal symptoms  Neurological: Negative for neurological symptoms  Psychologic: Negative for psychiatric symptoms   Physical Exam: There were no vitals taken for this visit.  Constitutional:  Alert and oriented, No acute distress. HEENT: [place] AT, moist mucus membranes.  Trachea midline, no masses. Cardiovascular: No clubbing, cyanosis, or edema. Respiratory: Normal respiratory effort, no increased work of breathing. GI: Abdomen is soft, nontender, nondistended, no abdominal masses GU: No CVA tenderness.  AUS in place w/o palpable erosion of pump or cuff. S/p orchiectomy.  Skin: No rashes, bruises or suspicious lesions. Lymph: No cervical or inguinal adenopathy. Neurologic: Grossly intact, no focal deficits, moving all 4 extremities. Psychiatric: Normal mood and affect.  Laboratory Data: Lab Results  Component Value Date   WBC 4.8 12/19/2015   HGB 13.4 12/19/2015   HCT 39.1 (L) 12/19/2015   MCV 89.1 12/19/2015   PLT 126 (L) 12/19/2015    Lab Results  Component Value Date   CREATININE 0.77 12/19/2015    Lab Results  Component Value Date   PSA 2.2 06/07/2013   PSA 3.6 01/05/2013    No results found for: TESTOSTERONE  No results found for: HGBA1C  Urinalysis    Component Value Date/Time   APPEARANCEUR Cloudy (A) 01/11/2017 1348   GLUCOSEU Negative 01/11/2017 1348   BILIRUBINUR Negative 01/11/2017 1348   PROTEINUR Negative 01/11/2017 1348   NITRITE Negative 01/11/2017 1348   LEUKOCYTESUR Negative 01/11/2017 1348     Assessment & Plan:    1. Castrate Resistant Prostate Cancer - PSA doubling rapidly now. Rec restaging imaging with CT abd/pelvis + bone scan and re-discuss. Likeliy start extandi or refer med-onc (taxotere) v. Rad-onc (bone seeking nucleotides) pending burden of disease.   2. Stress urinary incontinence - good control with funcitonal sphincter, observe.   3. Radiation cystitis - observe  RTC 4 weeks with MD after staging imaging.      Alexis Frock, [facility] [address], [place], [place] [address] [phone]

## 2017-04-12 NOTE — Addendum Note (Signed)
Addended by: Kyra Manges on: 04/12/2017 03:38 PM   Modules accepted: Orders

## 2017-04-13 LAB — COMPREHENSIVE METABOLIC PANEL
A/G RATIO: 2 (ref 1.2–2.2)
ALBUMIN: 4.6 g/dL (ref 3.6–4.8)
ALT: 10 IU/L (ref 0–44)
AST: 16 IU/L (ref 0–40)
Alkaline Phosphatase: 66 IU/L (ref 39–117)
BILIRUBIN TOTAL: 1.1 mg/dL (ref 0.0–1.2)
BUN / CREAT RATIO: 28 — AB (ref 10–24)
BUN: 30 mg/dL — ABNORMAL HIGH (ref 8–27)
CO2: 21 mmol/L (ref 18–29)
Calcium: 9.8 mg/dL (ref 8.6–10.2)
Chloride: 100 mmol/L (ref 96–106)
Creatinine, Ser: 1.08 mg/dL (ref 0.76–1.27)
GFR calc non Af Amer: 70 mL/min/{1.73_m2} (ref >59)
GFR, EST AFRICAN AMERICAN: 81 mL/min/{1.73_m2} (ref >59)
Globulin, Total: 2.3 g/dL (ref 1.5–4.5)
Glucose: 96 mg/dL (ref 65–99)
POTASSIUM: 4 mmol/L (ref 3.5–5.2)
Sodium: 137 mmol/L (ref 134–144)
TOTAL PROTEIN: 6.9 g/dL (ref 6.0–8.5)

## 2017-04-28 ENCOUNTER — Ambulatory Visit
Admission: RE | Admit: 2017-04-28 | Discharge: 2017-04-28 | Disposition: A | Discharge status: Home / Self Care | Payer: PPO | Source: Ambulatory Visit | Attending: Urology | Admitting: Urology

## 2017-04-28 ENCOUNTER — Ambulatory Visit: Admission: RE | Admit: 2017-04-28 | Payer: PPO | Source: Ambulatory Visit

## 2017-04-28 DIAGNOSIS — I2510 Atherosclerotic heart disease of native coronary artery without angina pectoris: Secondary | ICD-10-CM | POA: Insufficient documentation

## 2017-04-28 DIAGNOSIS — N281 Cyst of kidney, acquired: Secondary | ICD-10-CM | POA: Diagnosis not present

## 2017-04-28 DIAGNOSIS — M5136 Other intervertebral disc degeneration, lumbar region: Secondary | ICD-10-CM | POA: Diagnosis not present

## 2017-04-28 DIAGNOSIS — I700 Atherosclerosis of aorta: Secondary | ICD-10-CM | POA: Insufficient documentation

## 2017-04-28 DIAGNOSIS — C61 Malignant neoplasm of prostate: Secondary | ICD-10-CM | POA: Insufficient documentation

## 2017-04-28 MED ORDER — TECHNETIUM TC 99M MEDRONATE IV KIT
25.0000 | PACK | Freq: Once | INTRAVENOUS | Status: AC | PRN
  Administered 2017-04-28: 23.53 via INTRAVENOUS

## 2017-04-28 MED ORDER — IOPAMIDOL (ISOVUE-300) INJECTION 61%
100.0000 mL | Freq: Once | INTRAVENOUS | Status: AC | PRN
  Administered 2017-04-28: 100 mL via INTRAVENOUS

## 2017-05-03 ENCOUNTER — Ambulatory Visit: Payer: PPO | Admitting: Urology

## 2017-05-03 ENCOUNTER — Encounter: Payer: Self-pay | Admitting: Urology

## 2017-05-03 VITALS — BP 127/78 | HR 76 | Ht 72.0 in | Wt 278.2 lb

## 2017-05-03 DIAGNOSIS — C61 Malignant neoplasm of prostate: Secondary | ICD-10-CM

## 2017-05-03 DIAGNOSIS — C779 Secondary and unspecified malignant neoplasm of lymph node, unspecified: Secondary | ICD-10-CM | POA: Diagnosis not present

## 2017-05-03 DIAGNOSIS — I4891 Unspecified atrial fibrillation: Secondary | ICD-10-CM | POA: Diagnosis not present

## 2017-05-03 DIAGNOSIS — N393 Stress incontinence (female) (male): Secondary | ICD-10-CM

## 2017-05-03 NOTE — Progress Notes (Deleted)
05/03/2017 8:59 AM   Jeremy Carney 1946/05/18 [id number]  Referring provider: Rusty Aus, MD [place], [place] [address]  CC: Follow up prostate cancer after restaging imaging  HPI:   1. 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 Recent History: 2017 - PSA 0.5 12/2016 - PSA 3.0 03/2017 - PSA 7.8; CT and Bone Scant with non-bulky (2cm or less individual) Rt common iliac and paracaval nodes, no bone disesae ==> refer medical oncology  2. Stress urinary incontinence - artificial urniary sphincter placed prevoiusliy and working well.    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 Now minimal bother.   Today "Jeremy Carney" is seen in f/u abvoe. Interval staging imaging overall favorable with some non-bulky pelvic and abdominal nodes.   PMH: Past Medical History:  Diagnosis Date  . Arthritis, degenerative 10/30/2015  . Atrial fibrillation ([place]) 10/30/2015  . Bladder spasm 06/12/2013  . Carcinoma of prostate ([place]) 10/30/2015  . Cardiomyopathy ([place]) 09/10/2014   Overview:  EF 35%,4/15   . Cerebrovascular accident (CVA) ([place]) 10/30/2015   Overview:  Left embolic [id number]   . Difficult or painful urination 11/21/2013  . Dysrhythmia   . Gout 10/30/2015  . Malignant neoplasm of prostate ([place]) 09/05/2012  . Sinoatrial node dysfunction (HCC) 10/30/2015   Overview:  DDD pacemaker, 1996     Surgical History: Past Surgical History:  Procedure Laterality Date  . castration  10/2009  . CATARACT EXTRACTION W/ INTRAOCULAR LENS  IMPLANT, BILATERAL Bilateral 2016   right eye done then the left one done 3 months later in 2016  . INGUINAL HERNIA REPAIR Left   . INSERT / REPLACE / REMOVE PACEMAKER    . JOINT REPLACEMENT Bilateral 1980   hips  . left knee replacement  2014  . PACEMAKER INSERTION    .  PROSTATECTOMY    . REPLACEMENT TOTAL KNEE Bilateral   . TONSILLECTOMY AND ADENOIDECTOMY    . TRANSURETHRAL RESECTION OF BLADDER TUMOR N/A 12/31/2015   Procedure: BLADDER BIOPSY;  Surgeon: Nickie Retort, MD;  Location: [facility] ORS;  Service: Urology;  Laterality: N/A;  . Urinary incontinence valve     AMS    Home Medications:  Allergies as of 05/03/2017      Reactions   Latex Rash   Sensitivity, not allergy.      Medication List       Accurate as of 05/03/17  8:59 AM. Always use your most recent med list.          allopurinol 300 MG tablet Commonly known as:  ZYLOPRIM Take by mouth daily. In am.   Glucosamine Sulfate 1000 MG Caps Take 1 capsule by mouth 2 (two) times daily.   hydrochlorothiazide 25 MG tablet Commonly known as:  HYDRODIURIL Take 25 mg by mouth. In am   phentermine 15 MG capsule Take 15 mg by mouth every morning.   simvastatin 40 MG tablet Commonly known as:  ZOCOR Take 40 mg by mouth daily at 6 PM.   warfarin 2 MG tablet Commonly known as:  COUMADIN Take 2 mg by mouth once a week. On saturdays.   warfarin 5 MG tablet Commonly known as:  COUMADIN TAKE ONE (1) TABLET EACH DAY       Allergies:  Allergies  Allergen Reactions  . Latex Rash    Sensitivity, not allergy.    Family  History: Family History  Problem Relation Age of Onset  . Prostate cancer Neg Hx   . Bladder Cancer Neg Hx     Social History:  reports that he has never smoked. He has never used smokeless tobacco. He reports that he does not drink alcohol or use drugs.      Review of Systems  Gastrointestinal (upper)  : Negative for upper GI symptoms  Gastrointestinal (lower) : Negative for lower GI symptoms  Constitutional : Negative for symptoms  Skin: Negative for skin symptoms  Eyes: Negative for eye symptoms  Ear/Nose/Throat : Negative for Ear/Nose/Throat symptoms  Hematologic/Lymphatic: Negative for Hematologic/Lymphatic symptoms  Cardiovascular  : Negative for cardiovascular symptoms  Respiratory : Negative for respiratory symptoms  Endocrine: Negative for endocrine symptoms  Musculoskeletal: Negative for musculoskeletal symptoms  Neurological: Negative for neurological symptoms  Psychologic: Negative for psychiatric symptoms   Physical Exam: There were no vitals taken for this visit.  Constitutional:  Alert and oriented, No acute distress. HEENT: [place] AT, moist mucus membranes.  Trachea midline, no masses. Cardiovascular: No clubbing, cyanosis, or edema. Respiratory: Normal respiratory effort, no increased work of breathing. GI: Abdomen is soft, nontender, nondistended, no abdominal masses GU: No CVA tenderness.  AUS in place w/o palpable erosion of pump or cuff. S/p orchiectomy.  Skin: No rashes, bruises or suspicious lesions. Lymph: No cervical or inguinal adenopathy. Neurologic: Grossly intact, no focal deficits, moving all 4 extremities. Psychiatric: Normal mood and affect.  Laboratory Data: Lab Results  Component Value Date   WBC 4.8 12/19/2015   HGB 13.4 12/19/2015   HCT 39.1 (L) 12/19/2015   MCV 89.1 12/19/2015   PLT 126 (L) 12/19/2015    Lab Results  Component Value Date   CREATININE 1.08 04/12/2017    Lab Results  Component Value Date   PSA 2.2 06/07/2013   PSA 3.6 01/05/2013    No results found for: TESTOSTERONE  No results found for: HGBA1C  Urinalysis    Component Value Date/Time   APPEARANCEUR Cloudy (A) 01/11/2017 1348   GLUCOSEU Negative 01/11/2017 1348   BILIRUBINUR Negative 01/11/2017 1348   PROTEINUR Negative 01/11/2017 1348   NITRITE Negative 01/11/2017 1348   LEUKOCYTESUR Negative 01/11/2017 1348     Assessment & Plan:    1. Castrate Resistant Prostate Cancer - PSA doubling rapidly now and appears mostly nodal disease. Rec med onc referal next avail to consider systemic therapy (likely taxotere as quite young v. extandi / zytiga).  2. Stress urinary incontinence -  good control with funcitonal sphincter, observe.   3. Radiation cystitis - observe  RTC 47mos with PSA/T.   Jeremy Carney, [place] [address] [place], [place] [address] [phone]

## 2017-05-03 NOTE — Progress Notes (Signed)
05/03/2017 1:54 PM   Jeremy Carney Apr 16, 1946 [id number]  Referring provider: Rusty Aus, MD [place], [place] [address]  CC: Follow up prostate cancer after restaging imaging  HPI:   1. 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 Recent History: 2017 - PSA 0.5 12/2016 - PSA 3.0 03/2017 - PSA 7.8; CT and Bone Scant with non-bulky (2cm or less individual) Rt common iliac and paracaval nodes, no bone disesae ==> refer medical oncology  2. Stress urinary incontinence - artificial urniary sphincter placed prevoiusliy and working well.    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 Now minimal bother.   Today "Jeremy Carney" is seen in f/u abvoe. Interval staging imaging overall favorable with some non-bulky pelvic and abdominal nodes.   PMH: Past Medical History:  Diagnosis Date  . Arthritis, degenerative 10/30/2015  . Atrial fibrillation ([place]) 10/30/2015  . Bladder spasm 06/12/2013  . Carcinoma of prostate ([place]) 10/30/2015  . Cardiomyopathy ([place]) 09/10/2014   Overview:  EF 35%,4/15   . Cerebrovascular accident (CVA) ([place]) 10/30/2015   Overview:  Left embolic [id number]   . Difficult or painful urination 11/21/2013  . Dysrhythmia   . Gout 10/30/2015  . Malignant neoplasm of prostate ([place]) 09/05/2012  . Sinoatrial node dysfunction (HCC) 10/30/2015   Overview:  DDD pacemaker, 1996     Surgical History: Past Surgical History:  Procedure Laterality Date  . castration  10/2009  . CATARACT EXTRACTION W/ INTRAOCULAR LENS  IMPLANT, BILATERAL Bilateral 2016   right eye done then the left one done 3 months later in 2016  . INGUINAL HERNIA REPAIR Left   . INSERT / REPLACE / REMOVE PACEMAKER    . JOINT REPLACEMENT Bilateral 1980   hips  . left knee replacement  2014  . PACEMAKER INSERTION    .  PROSTATECTOMY    . REPLACEMENT TOTAL KNEE Bilateral   . TONSILLECTOMY AND ADENOIDECTOMY    . TRANSURETHRAL RESECTION OF BLADDER TUMOR N/A 12/31/2015   Procedure: BLADDER BIOPSY;  Surgeon: Nickie Retort, MD;  Location: [facility] ORS;  Service: Urology;  Laterality: N/A;  . Urinary incontinence valve     AMS    Home Medications:  Allergies as of 05/03/2017      Reactions   Latex Rash   Sensitivity, not allergy.      Medication List       Accurate as of 05/03/17  1:54 PM. Always use your most recent med list.          allopurinol 300 MG tablet Commonly known as:  ZYLOPRIM Take by mouth daily. In am.   Glucosamine Sulfate 1000 MG Caps Take 1 capsule by mouth 2 (two) times daily.   hydrochlorothiazide 25 MG tablet Commonly known as:  HYDRODIURIL Take 25 mg by mouth. In am   phentermine 15 MG capsule Take 15 mg by mouth every morning.   simvastatin 40 MG tablet Commonly known as:  ZOCOR Take 40 mg by mouth daily at 6 PM.   warfarin 2 MG tablet Commonly known as:  COUMADIN Take 2 mg by mouth once a week. On saturdays.   warfarin 5 MG tablet Commonly known as:  COUMADIN TAKE ONE (1) TABLET EACH DAY       Allergies:  Allergies  Allergen Reactions  . Latex Rash    Sensitivity, not allergy.    Family  History: Family History  Problem Relation Age of Onset  . Prostate cancer Neg Hx   . Bladder Cancer Neg Hx     Social History:  reports that he has never smoked. He has never used smokeless tobacco. He reports that he does not drink alcohol or use drugs. Psychologic Depression?: No Anxiety?: No    Review of Systems  Gastrointestinal (upper)  : Negative for upper GI symptoms  Gastrointestinal (lower) : Negative for lower GI symptoms  Constitutional : Negative for symptoms  Skin: Negative for skin symptoms  Eyes: Negative for eye symptoms  Ear/Nose/Throat : Negative for Ear/Nose/Throat symptoms  Hematologic/Lymphatic: Negative for  Hematologic/Lymphatic symptoms  Cardiovascular : Negative for cardiovascular symptoms  Respiratory : Negative for respiratory symptoms  Endocrine: Negative for endocrine symptoms  Musculoskeletal: Negative for musculoskeletal symptoms  Neurological: Negative for neurological symptoms  Psychologic: Negative for psychiatric symptoms   Physical Exam: BP 127/78 (BP Location: Left Arm, Patient Position: Sitting, Cuff Size: Normal)   Pulse 76   Ht 6' (1.829 m)   Wt 126.2 kg (278 lb 3.2 oz)   BMI 37.73 kg/m   Constitutional:  Alert and oriented, No acute distress. HEENT: [place] AT, moist mucus membranes.  Trachea midline, no masses. Cardiovascular: No clubbing, cyanosis, or edema. Respiratory: Normal respiratory effort, no increased work of breathing. GI: Abdomen is soft, nontender, nondistended, no abdominal masses GU: No CVA tenderness.  AUS in place w/o palpable erosion of pump or cuff. S/p orchiectomy.  Skin: No rashes, bruises or suspicious lesions. Lymph: No cervical or inguinal adenopathy. Neurologic: Grossly intact, no focal deficits, moving all 4 extremities. Psychiatric: Normal mood and affect.  Laboratory Data: Lab Results  Component Value Date   WBC 4.8 12/19/2015   HGB 13.4 12/19/2015   HCT 39.1 (L) 12/19/2015   MCV 89.1 12/19/2015   PLT 126 (L) 12/19/2015    Lab Results  Component Value Date   CREATININE 1.08 04/12/2017    Lab Results  Component Value Date   PSA 2.2 06/07/2013   PSA 3.6 01/05/2013    No results found for: TESTOSTERONE  No results found for: HGBA1C  Urinalysis    Component Value Date/Time   APPEARANCEUR Cloudy (A) 01/11/2017 1348   GLUCOSEU Negative 01/11/2017 1348   BILIRUBINUR Negative 01/11/2017 1348   PROTEINUR Negative 01/11/2017 1348   NITRITE Negative 01/11/2017 1348   LEUKOCYTESUR Negative 01/11/2017 1348     Assessment & Plan:    1. Castrate Resistant Prostate Cancer - PSA doubling rapidly now and appears mostly  nodal disease. Rec med onc referal next avail to consider systemic therapy (likely taxotere as quite young v. extandi / zytiga).  2. Stress urinary incontinence - good control with funcitonal sphincter, observe.   3. Radiation cystitis - observe  RTC 37mos with PSA/T.   Jeremy Carney, [place] [address] [place], [place] [address] [phone]

## 2017-05-26 ENCOUNTER — Ambulatory Visit: Payer: PPO | Admitting: Oncology

## 2017-06-03 ENCOUNTER — Inpatient Hospital Stay: Payer: PPO | Admitting: Oncology

## 2017-06-07 DIAGNOSIS — R300 Dysuria: Secondary | ICD-10-CM | POA: Diagnosis not present

## 2017-06-21 ENCOUNTER — Inpatient Hospital Stay: Payer: PPO

## 2017-06-21 ENCOUNTER — Inpatient Hospital Stay: Payer: PPO | Attending: Oncology | Admitting: Oncology

## 2017-06-21 ENCOUNTER — Telehealth: Payer: Self-pay | Admitting: *Deleted

## 2017-06-21 ENCOUNTER — Encounter: Payer: Self-pay | Admitting: Oncology

## 2017-06-21 VITALS — BP 138/88 | HR 92 | Temp 97.2°F | Resp 18 | Ht 72.84 in | Wt 271.5 lb

## 2017-06-21 DIAGNOSIS — R9721 Rising PSA following treatment for malignant neoplasm of prostate: Secondary | ICD-10-CM

## 2017-06-21 DIAGNOSIS — M1990 Unspecified osteoarthritis, unspecified site: Secondary | ICD-10-CM | POA: Diagnosis not present

## 2017-06-21 DIAGNOSIS — Z9841 Cataract extraction status, right eye: Secondary | ICD-10-CM | POA: Diagnosis not present

## 2017-06-21 DIAGNOSIS — Z7189 Other specified counseling: Secondary | ICD-10-CM

## 2017-06-21 DIAGNOSIS — N3289 Other specified disorders of bladder: Secondary | ICD-10-CM | POA: Insufficient documentation

## 2017-06-21 DIAGNOSIS — N393 Stress incontinence (female) (male): Secondary | ICD-10-CM | POA: Diagnosis not present

## 2017-06-21 DIAGNOSIS — C779 Secondary and unspecified malignant neoplasm of lymph node, unspecified: Secondary | ICD-10-CM

## 2017-06-21 DIAGNOSIS — Z96652 Presence of left artificial knee joint: Secondary | ICD-10-CM | POA: Insufficient documentation

## 2017-06-21 DIAGNOSIS — Z79899 Other long term (current) drug therapy: Secondary | ICD-10-CM

## 2017-06-21 DIAGNOSIS — C61 Malignant neoplasm of prostate: Secondary | ICD-10-CM

## 2017-06-21 DIAGNOSIS — E7801 Familial hypercholesterolemia: Secondary | ICD-10-CM | POA: Insufficient documentation

## 2017-06-21 DIAGNOSIS — Z8673 Personal history of transient ischemic attack (TIA), and cerebral infarction without residual deficits: Secondary | ICD-10-CM | POA: Insufficient documentation

## 2017-06-21 DIAGNOSIS — Z7901 Long term (current) use of anticoagulants: Secondary | ICD-10-CM | POA: Diagnosis not present

## 2017-06-21 DIAGNOSIS — Z9079 Acquired absence of other genital organ(s): Secondary | ICD-10-CM

## 2017-06-21 DIAGNOSIS — Z923 Personal history of irradiation: Secondary | ICD-10-CM

## 2017-06-21 DIAGNOSIS — I429 Cardiomyopathy, unspecified: Secondary | ICD-10-CM | POA: Diagnosis not present

## 2017-06-21 DIAGNOSIS — Z950 Presence of cardiac pacemaker: Secondary | ICD-10-CM | POA: Diagnosis not present

## 2017-06-21 DIAGNOSIS — I495 Sick sinus syndrome: Secondary | ICD-10-CM | POA: Insufficient documentation

## 2017-06-21 DIAGNOSIS — Z87442 Personal history of urinary calculi: Secondary | ICD-10-CM | POA: Insufficient documentation

## 2017-06-21 DIAGNOSIS — M109 Gout, unspecified: Secondary | ICD-10-CM | POA: Insufficient documentation

## 2017-06-21 DIAGNOSIS — G4700 Insomnia, unspecified: Secondary | ICD-10-CM | POA: Insufficient documentation

## 2017-06-21 DIAGNOSIS — I4891 Unspecified atrial fibrillation: Secondary | ICD-10-CM | POA: Insufficient documentation

## 2017-06-21 LAB — CBC WITH DIFFERENTIAL/PLATELET
BASOS ABS: 0 10*3/uL (ref 0–0.1)
BASOS PCT: 1 %
EOS PCT: 2 %
Eosinophils Absolute: 0.1 10*3/uL (ref 0–0.7)
HCT: 36.5 % — ABNORMAL LOW (ref 40.0–52.0)
Hemoglobin: 12.7 g/dL — ABNORMAL LOW (ref 13.0–18.0)
Lymphocytes Relative: 20 %
Lymphs Abs: 1 10*3/uL (ref 1.0–3.6)
MCH: 31.3 pg (ref 26.0–34.0)
MCHC: 34.9 g/dL (ref 32.0–36.0)
MCV: 89.8 fL (ref 80.0–100.0)
Monocytes Absolute: 0.4 10*3/uL (ref 0.2–1.0)
Monocytes Relative: 8 %
NEUTROS ABS: 3.6 10*3/uL (ref 1.4–6.5)
Neutrophils Relative %: 69 %
PLATELETS: 146 10*3/uL — AB (ref 150–440)
RBC: 4.07 MIL/uL — AB (ref 4.40–5.90)
RDW: 15 % — ABNORMAL HIGH (ref 11.5–14.5)
WBC: 5.1 10*3/uL (ref 3.8–10.6)

## 2017-06-21 LAB — COMPREHENSIVE METABOLIC PANEL
ALBUMIN: 4.4 g/dL (ref 3.5–5.0)
ALT: 11 U/L — AB (ref 17–63)
AST: 20 U/L (ref 15–41)
Alkaline Phosphatase: 63 U/L (ref 38–126)
Anion gap: 11 (ref 5–15)
BUN: 31 mg/dL — AB (ref 6–20)
CHLORIDE: 102 mmol/L (ref 101–111)
CO2: 26 mmol/L (ref 22–32)
CREATININE: 1.14 mg/dL (ref 0.61–1.24)
Calcium: 9.9 mg/dL (ref 8.9–10.3)
GFR calc Af Amer: >60 mL/min (ref >60)
GFR calc non Af Amer: >60 mL/min (ref >60)
GLUCOSE: 106 mg/dL — AB (ref 65–99)
POTASSIUM: 4.2 mmol/L (ref 3.5–5.1)
Sodium: 139 mmol/L (ref 135–145)
Total Bilirubin: 1.2 mg/dL (ref 0.3–1.2)
Total Protein: 7.5 g/dL (ref 6.5–8.1)

## 2017-06-21 LAB — LACTATE DEHYDROGENASE: LDH: 138 U/L (ref 98–192)

## 2017-06-21 LAB — PSA: Prostatic Specific Antigen: 13.3 ng/mL — ABNORMAL HIGH (ref 0.00–4.00)

## 2017-06-21 MED ORDER — DEXAMETHASONE 4 MG PO TABS: 8.0000 mg | ORAL_TABLET | Freq: Two times a day (BID) | ORAL | 1 refills | Status: DC

## 2017-06-21 MED ORDER — PROCHLORPERAZINE MALEATE 10 MG PO TABS: 10.0000 mg | ORAL_TABLET | Freq: Four times a day (QID) | ORAL | 1 refills | Status: DC | PRN

## 2017-06-21 MED ORDER — LORAZEPAM 0.5 MG PO TABS: 0.5000 mg | ORAL_TABLET | Freq: Four times a day (QID) | ORAL | 0 refills | Status: DC | PRN

## 2017-06-21 MED ORDER — ONDANSETRON HCL 8 MG PO TABS: 8.0000 mg | ORAL_TABLET | Freq: Two times a day (BID) | ORAL | 1 refills | Status: DC | PRN

## 2017-06-21 MED ORDER — LIDOCAINE-PRILOCAINE 2.5-2.5 % EX CREA: TOPICAL_CREAM | CUTANEOUS | 3 refills | Status: DC

## 2017-06-21 NOTE — Progress Notes (Signed)
Here for new pt evaluation.  

## 2017-06-21 NOTE — Telephone Encounter (Signed)
Per Holley Raring form [place] office ok to suspend coumadin for port placement and resume after procedure.

## 2017-06-21 NOTE — Telephone Encounter (Signed)
Patient called and states he wants to leave on 9/23 if that fits into the schedule

## 2017-06-21 NOTE — Progress Notes (Signed)
Hematology/Oncology Consult note [facility] [phone] [phone]  Patient Care Team: Rusty Aus, MD as PCP - General (Internal Medicine)   Name of the patient: Jeremy Carney  [id number]  10-11-1946    Reason for referral- metastatic prostate cancer   Referring physician- Dr. Tresa Moore  Date of visit: 06/21/17   History of presenting illness- Patient is a 71 year old gentleman with a prior history of prostate cancer in 1999 status post radical prostatectomy. He had biochemical recurrence in 2017 and was treated with IM RT. He again began to have a rising PSA in 2010 and underwent orchiectomy in 2010.his PSA was being monitored closely PSA a year ago was 0.5, 5 months ago was 3.9 in 2 months ago was 7.8.he underwent CT abdomen and pelvis in May 2018which showed abnormal periaortic and right common iliac lymph nodes. Index aortocaval lymph node 1.4 cm which was new as compared to February 2014 and suspicious for malignancy. Bone scan did not reveal any evidence of bony metastatic disease. He has been referred to [place] for systemic therapy options for CRPC  Patient lives with his wife and is independent of his ADL's. He has A fib for which he is on coumadin. Also has chronic urinary incontinence from prior prostate surgery and has AUS. Reports 50 pound intentional weight loss since October 2017  ECOG PS- 1  Pain scale- 0   Review of systems- Review of Systems  Constitutional: Negative for chills, fever, malaise/fatigue and weight loss.  HENT: Negative for congestion, ear discharge and nosebleeds.   Eyes: Negative for blurred vision.  Respiratory: Negative for cough, hemoptysis, sputum production, shortness of breath and wheezing.   Cardiovascular: Negative for chest pain, palpitations, orthopnea and claudication.  Gastrointestinal: Negative for abdominal pain, blood in stool, constipation, diarrhea, heartburn, melena, nausea and vomiting.    Genitourinary: Negative for dysuria, flank pain, frequency, hematuria and urgency.  Musculoskeletal: Negative for back pain, joint pain and myalgias.  Skin: Negative for rash.  Neurological: Negative for dizziness, tingling, focal weakness, seizures, weakness and headaches.  Endo/Heme/Allergies: Does not bruise/bleed easily.  Psychiatric/Behavioral: Negative for depression and suicidal ideas. The patient does not have insomnia.     Allergies  Allergen Reactions  . Latex Rash    Sensitivity, not allergy.    Patient Active Problem List   Diagnosis Date Noted  . 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 SUI (stress urinary incontinence), male 04/12/2017  . Radiation cystitis 04/12/2017  . Atrial fibrillation ([place]) 10/30/2015  . Gout 10/30/2015  . 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 Arthritis, degenerative 10/30/2015  . Carcinoma of prostate ([place]) 10/30/2015  . Sinoatrial node dysfunction ([place]) 10/30/2015  . Cerebrovascular accident (CVA) ([place]) 10/30/2015  . 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 Cardiomyopathy ([place]) 09/10/2014  . Difficult or painful urination 11/21/2013  . Bladder spasm 06/12/2013  . FOM (frequency of micturition) 06/12/2013  . Malignant neoplasm of prostate ([place]) 09/05/2012  . H/O urinary stone 09/05/2012     Past Medical History:  Diagnosis Date  . Arthritis, degenerative 10/30/2015  . Atrial fibrillation ([place]) 10/30/2015  . Bladder spasm 06/12/2013  . Carcinoma of prostate ([place]) 10/30/2015  . Cardiomyopathy ([place]) 09/10/2014   Overview:  EF 35%,4/15   . Cerebrovascular accident (CVA) ([place]) 10/30/2015   Overview:  Left embolic [id number],[id number]   . Difficult or painful urination 11/21/2013  . Dysrhythmia   . Gout 10/30/2015  . Malignant neoplasm of prostate ([place])  09/05/2012  . Sinoatrial node dysfunction (HCC) 10/30/2015   Overview:  DDD pacemaker, 1996      Past Surgical History:  Procedure  Laterality Date  . castration  10/2009  . CATARACT EXTRACTION W/ INTRAOCULAR LENS  IMPLANT, BILATERAL Bilateral 2016   right eye done then the left one done 3 months later in 2016  . INGUINAL HERNIA REPAIR Left   . INSERT / REPLACE / REMOVE PACEMAKER    . JOINT REPLACEMENT Bilateral 1980   hips  . left knee replacement  2014  . PACEMAKER INSERTION    . PROSTATECTOMY    . REPLACEMENT TOTAL HIP W/  RESURFACING IMPLANTS  1997   bilateral-   . REPLACEMENT TOTAL KNEE Bilateral   . testes removal     per pt   . TONSILLECTOMY AND ADENOIDECTOMY    . TRANSURETHRAL RESECTION OF BLADDER TUMOR N/A 12/31/2015   Procedure: BLADDER BIOPSY;  Surgeon: Nickie Retort, MD;  Location: [facility] ORS;  Service: Urology;  Laterality: N/A;  . Urinary incontinence valve     AMS    Social History   Social History  . Marital status: Married    Spouse name: N/A  . Number of children: N/A  . Years of education: N/A   Occupational History  . Not on file.   Social History Main Topics  . Smoking status: Never Smoker  . Smokeless tobacco: Never Used  . Alcohol use No  . Drug use: No  . Sexual activity: No   Other Topics Concern  . Not on file   Social History Narrative  . No narrative on file     Family History  Problem Relation Age of Onset  . Cancer Mother   . Arthritis Mother   . Heart attack Father   . Arthritis Father   . Transient ischemic attack Brother   . Prostate cancer Neg Hx   . Bladder Cancer Neg Hx      Current Outpatient Prescriptions:  .  allopurinol (ZYLOPRIM) 300 MG tablet, Take by mouth daily. In am., Disp: , Rfl:  .  Glucosamine Sulfate 1000 MG CAPS, Take 1 capsule by mouth 2 (two) times daily. , Disp: , Rfl:  .  hydrochlorothiazide (HYDRODIURIL) 25 MG tablet, Take 25 mg by mouth. In am, Disp: , Rfl:  .  simvastatin (ZOCOR) 40 MG tablet, Take 40 mg by mouth daily at 6 PM. , Disp: , Rfl:  .  warfarin (COUMADIN) 2 MG tablet, Take 2 mg by mouth once a week. On  saturdays., Disp: , Rfl:  .  warfarin (COUMADIN) 5 MG tablet, TAKE ONE (1) TABLET EACH DAY, Disp: , Rfl:  .  phentermine 15 MG capsule, Take 15 mg by mouth every morning., Disp: , Rfl:   Current Facility-Administered Medications:  .  lidocaine (XYLOCAINE) 2 % jelly 1 application, 1 application, Urethral, Once, Nickie Retort, MD   Physical exam:  Vitals:   06/21/17 1406  BP: 138/88  Pulse: 92  Resp: 18  Temp: (!) 97.2 F (36.2 C)  TempSrc: Tympanic  Weight: 271 lb 8 oz (123.2 kg)  Height: 6' 0.83" (1.85 m)   Physical Exam  Constitutional: He is oriented to person, place, and time and well-developed, well-nourished, and in no distress.  HENT:  Head: Normocephalic and atraumatic.  Eyes: EOM are normal. Pupils are equal, round, and reactive to light.  Neck: Normal range of motion.  Cardiovascular: Normal rate, regular rhythm and normal heart sounds.   Pulmonary/Chest: Effort  normal and breath sounds normal.  Abdominal: Soft. Bowel sounds are normal.  Neurological: He is alert and oriented to person, place, and time.  Skin: Skin is warm and dry.       CMP Latest Ref Rng & Units 04/12/2017  Glucose 65 - 99 mg/dL 96  BUN 8 - 27 mg/dL 30(H)  Creatinine 0.76 - 1.27 mg/dL 1.08  Sodium 134 - 144 mmol/L 137  Potassium 3.5 - 5.2 mmol/L 4.0  Chloride 96 - 106 mmol/L 100  CO2 18 - 29 mmol/L 21  Calcium 8.6 - 10.2 mg/dL 9.8  Total Protein 6.0 - 8.5 g/dL 6.9  Total Bilirubin 0.0 - 1.2 mg/dL 1.1  Alkaline Phos 39 - 117 IU/L 66  AST 0 - 40 IU/L 16  ALT 0 - 44 IU/L 10   CBC Latest Ref Rng & Units 12/19/2015  WBC 3.8 - 10.6 K/uL 4.8  Hemoglobin 13.0 - 18.0 g/dL 13.4  Hematocrit 40.0 - 52.0 % 39.1(L)  Platelets 150 - 440 K/uL 126(L)    Assessment and plan- Patient is a 71 y.o. 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 He has a rapidly doubling PSA of 4.2 months after orchiectomy and CT abdomen shows  non bulky adenopathy. No bone mets. Discussed different treatment options including docetaxel and abiraterone. Discussed side effects of docetaxel including all but not limited to nausea, vomiting, fatigue, low blood counts, risk of peripheral neuropathy. I would recommend 75 mg/m IV docetaxel every 3 weeks for atleast 6 cycles. Discussed risks and benefits of either Actron including all but not limited to hypertension, leg swelling, abnormal LFTs and electrolyte disturbances, fatigue and insomnia. Patient understands risks and benefits of both.   Given the rapid PSA doubling time of 4.2 months, I would favor docetaxel over abiraterone although there are no head to head trials comparing the two. He also has a better PS and likely to tolerate chemotherapy better this time. Patient understands and agrees to proceed with chemotherapy. We will plan to get port placement and he will go for chemo class. Plan to start chemotherapy tentatively in 2 weeks time. Chemotherapy will be given with a palliative intent.   No indication for bisphosphonates as patient has no bone mets. Will check ct chest w/o contrast to r/o lung mets.   I will check cbc, CMP, PSA, LDH today  Will hold off on getting LN biopsy for path confirmation as the intra abdominal adenopathy coincides with rise in PSA. Will consider biopsy down the line upon progression  Thank you for this kind referral and the opportunity to participate in the care of this patient   Visit Diagnosis 1. Malignant neoplasm of prostate ([place])   2. Regional lymph node metastasis present [facility])     Dr. Randa Evens, MD, MPH [facility] at [facility] Pager- [phone] 06/21/2017 2:54 PM

## 2017-06-22 NOTE — Telephone Encounter (Signed)
Patient informed that Dr Janese Banks will work around his vacation He states he will wait until the 24th to leave for vacation. I told him to discuss it with Dr Janese Banks at his appt

## 2017-06-22 NOTE — Telephone Encounter (Signed)
We will adjust his treatment based on his planned vacation. Please tell him to make his plans and we will around it.

## 2017-06-27 ENCOUNTER — Other Ambulatory Visit: Payer: Self-pay | Admitting: Radiology

## 2017-06-27 ENCOUNTER — Ambulatory Visit
Admission: RE | Admit: 2017-06-27 | Discharge: 2017-06-27 | Disposition: A | Discharge status: Home / Self Care | Payer: PPO | Source: Ambulatory Visit | Attending: Oncology | Admitting: Oncology

## 2017-06-27 DIAGNOSIS — N2889 Other specified disorders of kidney and ureter: Secondary | ICD-10-CM | POA: Diagnosis not present

## 2017-06-27 DIAGNOSIS — R918 Other nonspecific abnormal finding of lung field: Secondary | ICD-10-CM | POA: Insufficient documentation

## 2017-06-27 DIAGNOSIS — C61 Malignant neoplasm of prostate: Secondary | ICD-10-CM | POA: Diagnosis not present

## 2017-06-27 NOTE — Patient Instructions (Signed)
Docetaxel injection What is this medicine? DOCETAXEL (doe se TAX el) is a chemotherapy drug. It targets fast dividing cells, like cancer cells, and causes these cells to die. This medicine is used to treat many types of cancers like breast cancer, certain stomach cancers, head and neck cancer, lung cancer, and prostate cancer. This medicine may be used for other purposes; ask your health care provider or pharmacist if you have questions. COMMON BRAND NAME(S): Docefrez, Taxotere What should I tell my health care provider before I take this medicine? They need to know if you have any of these conditions: -infection (especially a virus infection such as chickenpox, cold sores, or herpes) -liver disease -low blood counts, like low white cell, platelet, or red cell counts -an unusual or allergic reaction to docetaxel, polysorbate 80, other chemotherapy agents, other medicines, foods, dyes, or preservatives -pregnant or trying to get pregnant -breast-feeding How should I use this medicine? This drug is given as an infusion into a vein. It is administered in a hospital or clinic by a specially trained health care professional. Talk to your pediatrician regarding the use of this medicine in children. Special care may be needed. Overdosage: If you think you have taken too much of this medicine contact a poison control center or emergency room at once. NOTE: This medicine is only for you. Do not share this medicine with others. What if I miss a dose? It is important not to miss your dose. Call your doctor or health care professional if you are unable to keep an appointment. What may interact with this medicine? -cyclosporine -erythromycin -ketoconazole -medicines to increase blood counts like filgrastim, pegfilgrastim, sargramostim -vaccines Talk to your doctor or health care professional before taking any of these medicines: -acetaminophen -aspirin -ibuprofen -ketoprofen -naproxen This list  may not describe all possible interactions. Give your health care provider a list of all the medicines, herbs, non-prescription drugs, or dietary supplements you use. Also tell them if you smoke, drink alcohol, or use illegal drugs. Some items may interact with your medicine. What should I watch for while using this medicine? Your condition will be monitored carefully while you are receiving this medicine. You will need important blood work done while you are taking this medicine. This drug may make you feel generally unwell. This is not uncommon, as chemotherapy can affect healthy cells as well as cancer cells. Report any side effects. Continue your course of treatment even though you feel ill unless your doctor tells you to stop. In some cases, you may be given additional medicines to help with side effects. Follow all directions for their use. Call your doctor or health care professional for advice if you get a fever, chills or sore throat, or other symptoms of a cold or flu. Do not treat yourself. This drug decreases your body's ability to fight infections. Try to avoid being around people who are sick. This medicine may increase your risk to bruise or bleed. Call your doctor or health care professional if you notice any unusual bleeding. This medicine may contain alcohol in the product. You may get drowsy or dizzy. Do not drive, use machinery, or do anything that needs mental alertness until you know how this medicine affects you. Do not stand or sit up quickly, especially if you are an older patient. This reduces the risk of dizzy or fainting spells. Avoid alcoholic drinks. Do not become pregnant while taking this medicine. Women should inform their doctor if they wish to become pregnant or   think they might be pregnant. There is a potential for serious side effects to an unborn child. Talk to your health care professional or pharmacist for more information. Do not breast-feed an infant while taking  this medicine. What side effects may I notice from receiving this medicine? Side effects that you should report to your doctor or health care professional as soon as possible: -allergic reactions like skin rash, itching or hives, swelling of the face, lips, or tongue -low blood counts - This drug may decrease the number of white blood cells, red blood cells and platelets. You may be at increased risk for infections and bleeding. -signs of infection - fever or chills, cough, sore throat, pain or difficulty passing urine -signs of decreased platelets or bleeding - bruising, pinpoint red spots on the skin, black, tarry stools, nosebleeds -signs of decreased red blood cells - unusually weak or tired, fainting spells, lightheadedness -breathing problems -fast or irregular heartbeat -low blood pressure -mouth sores -nausea and vomiting -pain, swelling, redness or irritation at the injection site -pain, tingling, numbness in the hands or feet -swelling of the ankle, feet, hands -weight gain Side effects that usually do not require medical attention (report to your doctor or health care professional if they continue or are bothersome): -bone pain -complete hair loss including hair on your head, underarms, pubic hair, eyebrows, and eyelashes -diarrhea -excessive tearing -changes in the color of fingernails -loosening of the fingernails -nausea -muscle pain -red flush to skin -sweating -weak or tired This list may not describe all possible side effects. Call your doctor for medical advice about side effects. You may report side effects to FDA at 1-800-FDA-1088. Where should I keep my medicine? This drug is given in a hospital or clinic and will not be stored at home. NOTE: This sheet is a summary. It may not cover all possible information. If you have questions about this medicine, talk to your doctor, pharmacist, or health care provider.  2018 Elsevier/Gold Standard (2016-01-01 12:32:56)  

## 2017-06-28 ENCOUNTER — Inpatient Hospital Stay: Payer: PPO

## 2017-06-28 ENCOUNTER — Ambulatory Visit: Admission: RE | Admit: 2017-06-28 | Payer: PPO | Source: Ambulatory Visit

## 2017-07-01 ENCOUNTER — Ambulatory Visit
Admission: RE | Admit: 2017-07-01 | Discharge: 2017-07-01 | Disposition: A | Discharge status: Home / Self Care | Payer: PPO | Source: Ambulatory Visit | Attending: Oncology | Admitting: Oncology

## 2017-07-01 DIAGNOSIS — I429 Cardiomyopathy, unspecified: Secondary | ICD-10-CM | POA: Diagnosis not present

## 2017-07-01 DIAGNOSIS — C61 Malignant neoplasm of prostate: Secondary | ICD-10-CM

## 2017-07-01 DIAGNOSIS — M109 Gout, unspecified: Secondary | ICD-10-CM | POA: Insufficient documentation

## 2017-07-01 DIAGNOSIS — Z8673 Personal history of transient ischemic attack (TIA), and cerebral infarction without residual deficits: Secondary | ICD-10-CM | POA: Insufficient documentation

## 2017-07-01 DIAGNOSIS — I4891 Unspecified atrial fibrillation: Secondary | ICD-10-CM | POA: Diagnosis not present

## 2017-07-01 DIAGNOSIS — Z452 Encounter for adjustment and management of vascular access device: Secondary | ICD-10-CM | POA: Diagnosis not present

## 2017-07-01 DIAGNOSIS — Z7901 Long term (current) use of anticoagulants: Secondary | ICD-10-CM | POA: Insufficient documentation

## 2017-07-01 DIAGNOSIS — Z950 Presence of cardiac pacemaker: Secondary | ICD-10-CM | POA: Diagnosis not present

## 2017-07-01 HISTORY — DX: Personal history of urinary calculi: Z87.442

## 2017-07-01 HISTORY — PX: IR FLUORO GUIDE PORT INSERTION RIGHT: IMG5741

## 2017-07-01 LAB — BASIC METABOLIC PANEL
Anion gap: 6 (ref 5–15)
BUN: 27 mg/dL — ABNORMAL HIGH (ref 6–20)
CALCIUM: 10 mg/dL (ref 8.9–10.3)
CO2: 26 mmol/L (ref 22–32)
CREATININE: 0.94 mg/dL (ref 0.61–1.24)
Chloride: 106 mmol/L (ref 101–111)
GFR calc Af Amer: >60 mL/min (ref >60)
Glucose, Bld: 105 mg/dL — ABNORMAL HIGH (ref 65–99)
Potassium: 4.3 mmol/L (ref 3.5–5.1)
SODIUM: 138 mmol/L (ref 135–145)

## 2017-07-01 LAB — PROTIME-INR
INR: 1.14
PROTHROMBIN TIME: 14.7 s (ref 11.4–15.2)

## 2017-07-01 LAB — CBC
HCT: 37.4 % — ABNORMAL LOW (ref 40.0–52.0)
Hemoglobin: 12.8 g/dL — ABNORMAL LOW (ref 13.0–18.0)
MCH: 31 pg (ref 26.0–34.0)
MCHC: 34.2 g/dL (ref 32.0–36.0)
MCV: 90.5 fL (ref 80.0–100.0)
PLATELETS: 125 10*3/uL — AB (ref 150–440)
RBC: 4.13 MIL/uL — AB (ref 4.40–5.90)
RDW: 15.1 % — ABNORMAL HIGH (ref 11.5–14.5)
WBC: 4.9 10*3/uL (ref 3.8–10.6)

## 2017-07-01 LAB — APTT: APTT: 29 s (ref 24–36)

## 2017-07-01 MED ORDER — LIDOCAINE-EPINEPHRINE (PF) 1 %-1:200000 IJ SOLN
INTRAMUSCULAR | Status: AC | PRN
  Administered 2017-07-01: 10 mL

## 2017-07-01 MED ORDER — MIDAZOLAM HCL 5 MG/5ML IJ SOLN
INTRAMUSCULAR | Status: AC
  Filled 2017-07-01: qty 5

## 2017-07-01 MED ORDER — SODIUM CHLORIDE 0.9 % IV SOLN
INTRAVENOUS | Status: DC
  Administered 2017-07-01: 12:00:00 via INTRAVENOUS

## 2017-07-01 MED ORDER — HEPARIN (PORCINE) IN NACL 2-0.9 UNIT/ML-% IJ SOLN
INTRAMUSCULAR | Status: AC
  Filled 2017-07-01: qty 500

## 2017-07-01 MED ORDER — CEFAZOLIN SODIUM-DEXTROSE 2-4 GM/100ML-% IV SOLN
2.0000 g | INTRAVENOUS | Status: AC
  Administered 2017-07-01: 2 g via INTRAVENOUS

## 2017-07-01 MED ORDER — LIDOCAINE-EPINEPHRINE (PF) 2 %-1:200000 IJ SOLN
INTRAMUSCULAR | Status: AC
  Filled 2017-07-01: qty 20

## 2017-07-01 MED ORDER — FENTANYL CITRATE (PF) 100 MCG/2ML IJ SOLN
INTRAMUSCULAR | Status: AC | PRN
  Administered 2017-07-01: 25 ug via INTRAVENOUS

## 2017-07-01 MED ORDER — FENTANYL CITRATE (PF) 100 MCG/2ML IJ SOLN
INTRAMUSCULAR | Status: AC
  Filled 2017-07-01: qty 2

## 2017-07-01 MED ORDER — MIDAZOLAM HCL 5 MG/5ML IJ SOLN
INTRAMUSCULAR | Status: AC | PRN
  Administered 2017-07-01: 1 mg via INTRAVENOUS

## 2017-07-01 MED ORDER — HEPARIN SOD (PORK) LOCK FLUSH 100 UNIT/ML IV SOLN
INTRAVENOUS | Status: AC
  Administered 2017-07-01: 12:00:00
  Filled 2017-07-01: qty 5

## 2017-07-01 NOTE — H&P (Signed)
Chief Complaint: Patient was seen in consultation today for port placement at the request of Merriman C  Referring Physician(s): Rao,Archana C  Supervising Physician: Daryll Brod  Patient Status: [facility] - Out-pt  History of Present Illness: Jeremy Carney is a 71 y.o. male with metastatic prostate cancer. He is to begin chemotherapy soon and is referred for port placement. PMHx, meds, labs, imaging reviewed. Has held his Coumadin for the past 5 days. Has been NPO this am. Wife at bedside. He feels well, no recent fevers, chills, illness.  Past Medical History:  Diagnosis Date  . Arthritis, degenerative 10/30/2015  . Atrial fibrillation ([place]) 10/30/2015  . Bladder spasm 06/12/2013  . Carcinoma of prostate ([place]) 10/30/2015  . Cardiomyopathy ([place]) 09/10/2014   Overview:  EF 35%,4/15   . Cerebrovascular accident (CVA) ([place]) 10/30/2015   Overview:  Left embolic URK,2706   . Difficult or painful urination 11/21/2013  . Dysrhythmia   . Gout 10/30/2015  . History of kidney stones   . Malignant neoplasm of prostate ([place]) 09/05/2012  . Sinoatrial node dysfunction ([facility]) 10/30/2015   Overview:  DDD pacemaker, 1996     Past Surgical History:  Procedure Laterality Date  . castration  10/2009  . CATARACT EXTRACTION W/ INTRAOCULAR LENS  IMPLANT, BILATERAL Bilateral 2016   right eye done then the left one done 3 months later in 2016  . INGUINAL HERNIA REPAIR Left   . INSERT / REPLACE / REMOVE PACEMAKER    . JOINT REPLACEMENT Bilateral 1980   hips  . left knee replacement  2014  . PACEMAKER INSERTION    . PROSTATECTOMY    . REPLACEMENT TOTAL HIP W/  RESURFACING IMPLANTS  1997   bilateral-   . REPLACEMENT TOTAL KNEE Bilateral   . testes removal     per pt   . TONSILLECTOMY AND ADENOIDECTOMY    . TRANSURETHRAL RESECTION OF BLADDER TUMOR N/A 12/31/2015   Procedure: BLADDER BIOPSY;  Surgeon: Nickie Retort, MD;  Location: [facility] ORS;  Service: Urology;  Laterality: N/A;  .  Urinary incontinence valve     AMS    Allergies: Latex  Medications: Prior to Admission medications   Medication Sig Start Date End Date Taking? Authorizing Provider  Glucosamine Sulfate 1000 MG CAPS Take 1 capsule by mouth 2 (two) times daily.    Yes [provider]  hydrochlorothiazide (HYDRODIURIL) 25 MG tablet Take 25 mg by mouth. In am   Yes [provider]  simvastatin (ZOCOR) 40 MG tablet Take 40 mg by mouth daily at 6 PM.  09/10/14  Yes [provider]  allopurinol (ZYLOPRIM) 300 MG tablet Take by mouth daily. In am. 09/10/14   [provider]  dexamethasone (DECADRON) 4 MG tablet Take 2 tablets (8 mg total) by mouth 2 (two) times daily. Start the day before Taxotere. Then daily after chemo for 2 days. Patient not taking: Reported on 07/01/2017 06/21/17   Sindy Guadeloupe, MD  lidocaine-prilocaine (EMLA) cream Apply to affected area once 06/21/17   Sindy Guadeloupe, MD  LORazepam (ATIVAN) 0.5 MG tablet Take 1 tablet (0.5 mg total) by mouth every 6 (six) hours as needed (Nausea or vomiting). Patient not taking: Reported on 07/01/2017 06/21/17   Sindy Guadeloupe, MD  ondansetron (ZOFRAN) 8 MG tablet Take 1 tablet (8 mg total) by mouth 2 (two) times daily as needed for refractory nausea / vomiting. Patient not taking: Reported on 07/01/2017 06/21/17   Sindy Guadeloupe, MD  phentermine 15 MG capsule Take 15 mg by mouth every morning.    [provider]  prochlorperazine (COMPAZINE) 10 MG tablet Take 1 tablet (10 mg total) by mouth every 6 (six) hours as needed (Nausea or vomiting). 06/21/17   Sindy Guadeloupe, MD  warfarin (COUMADIN) 2 MG tablet Take 2 mg by mouth once a week. On saturdays.    [provider]  warfarin (COUMADIN) 5 MG tablet TAKE ONE (1) TABLET EACH DAY 04/10/15   [provider]     Family History  Problem Relation Age of Onset  . Cancer Mother   . Arthritis Mother   . Heart attack Father   . Arthritis Father   .  Transient ischemic attack Brother   . Prostate cancer Neg Hx   . Bladder Cancer Neg Hx     Social History   Social History  . Marital status: Married    Spouse name: N/A  . Number of children: N/A  . Years of education: N/A   Social History Main Topics  . Smoking status: Never Smoker  . Smokeless tobacco: Never Used  . Alcohol use No  . Drug use: No  . Sexual activity: No   Other Topics Concern  . None   Social History Narrative  . None    Review of Systems: A 12 point ROS discussed and pertinent positives are indicated in the HPI above.  All other systems are negative.  Review of Systems  Vital Signs: Pulse 76   Temp 97.7 F (36.5 C) (Oral)   Resp 20   SpO2 100%   Physical Exam  Constitutional: He is oriented to person, place, and time. He appears well-developed and well-nourished. No distress.  HENT:  Head: Normocephalic.  Mouth/Throat: Oropharynx is clear and moist.  Neck: Normal range of motion. No JVD present. No tracheal deviation present.  Cardiovascular: Normal rate, regular rhythm and normal heart sounds.   Pulmonary/Chest: Effort normal and breath sounds normal. No respiratory distress.  Neurological: He is alert and oriented to person, place, and time.  Skin: Skin is warm and dry.  Psychiatric: He has a normal mood and affect. Judgment normal.    Mallampati Score:  MD Evaluation Airway: WNL Heart: WNL Abdomen: WNL Chest/ Lungs: WNL ASA  Classification: 3 Mallampati/Airway Score: One  Imaging: Ct Chest Wo Contrast  Result Date: 06/27/2017 CLINICAL DATA:  Prostate cancer. EXAM: CT CHEST WITHOUT CONTRAST TECHNIQUE: Multidetector CT imaging of the chest was performed following the standard protocol without IV contrast. COMPARISON:  None. FINDINGS: Cardiovascular: Heart size borderline increased. No pericardial effusion. Coronary artery calcification is noted. Atherosclerotic calcification is noted in the wall of the thoracic aorta. Left-sided  permanent pacemaker. Mediastinum/Nodes: 10 mm short axis AP window lymph node. No other mediastinal lymphadenopathy. No evidence for gross hilar lymphadenopathy although assessment is limited by the lack of intravenous contrast on today's study. The esophagus has normal imaging features. There is no axillary lymphadenopathy. Lungs/Pleura: Tiny calcified granuloma identified posterior right upper lobe. Small focus of peribronchovascular nodularity identified posterior right costophrenic sulcus. Similar peribronchovascular nodularity identified posterior left lower lobe no focal airspace consolidation. No pulmonary edema or pleural effusion. Upper Abdomen: Masslike focal thickening have the interpolar left kidney noted (see image 166 series 2. 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 Musculoskeletal: Degenerative changes characterize both shoulders. Bilateral gynecomastia. IMPRESSION: 1. No evidence for metastatic disease in the chest. 2. Scattered areas of peribronchovascular nodularity in the lower lobes. Imaging features  suggest atypical infection. 3. Masslike focal thickening of the interpolar left kidney. Incomplete visualization of the left kidney and noncontrast nature this study hinders assessment. While this is likely related to renal development, underlying mass lesion cannot be excluded. MRI of the abdomen without and with contrast could be used to further evaluate. Electronically Signed   By: Misty Stanley M.D.   On: 06/27/2017 16:58    Labs:  CBC:  Recent Labs  06/21/17 1512  WBC 5.1  HGB 12.7*  HCT 36.5*  PLT 146*    COAGS: No results for input(s): INR, APTT in the last 8760 hours.  BMP:  Recent Labs  04/12/17 1539 06/21/17 1512  NA 137 139  K 4.0 4.2  CL 100 102  CO2 21 26  GLUCOSE 96 106*  BUN 30* 31*  CALCIUM 9.8 9.9  CREATININE 1.08 1.14  GFRNONAA 70 >60  GFRAA 81 >60    LIVER FUNCTION TESTS:  Recent Labs  04/12/17 1539 06/21/17 1512    BILITOT 1.1 1.2  AST 16 20  ALT 10 11*  ALKPHOS 66 63  PROT 6.9 7.5  ALBUMIN 4.6 4.4    TUMOR MARKERS: No results for input(s): AFPTM, CEA, CA199, CHROMGRNA in the last 8760 hours.  Assessment and Plan: Metastatic prostate cancer For port placement Labs pending Risks and Benefits discussed with the patient including, but not limited to bleeding, infection, pneumothorax, or fibrin sheath development and need for additional procedures. All of the patient's questions were answered, patient is agreeable to proceed. Consent signed and in chart.    Thank you for this interesting consult.  I greatly enjoyed meeting DURK CARMEN and look forward to participating in their care.  A copy of this report was sent to the requesting provider on this date.  Electronically Signed: Ascencion Dike, PA-C 07/01/2017, 11:42 AM   I spent a total of 20 minutes in face to face in clinical consultation, greater than 50% of which was counseling/coordinating care for port placement

## 2017-07-01 NOTE — Procedures (Signed)
Metastatic prostate ca  S/p RT IJ POWER PORT  No comp Stable Tip svcra READY for use Full report in pacs EBL 0

## 2017-07-05 ENCOUNTER — Inpatient Hospital Stay: Payer: PPO

## 2017-07-05 ENCOUNTER — Inpatient Hospital Stay (HOSPITAL_BASED_OUTPATIENT_CLINIC_OR_DEPARTMENT_OTHER): Payer: PPO | Admitting: Oncology

## 2017-07-05 ENCOUNTER — Encounter: Payer: Self-pay | Admitting: Oncology

## 2017-07-05 VITALS — BP 127/78 | HR 58 | Temp 97.6°F | Wt 275.9 lb

## 2017-07-05 VITALS — BP 132/76 | HR 80 | Temp 98.0°F | Resp 18

## 2017-07-05 DIAGNOSIS — R9721 Rising PSA following treatment for malignant neoplasm of prostate: Secondary | ICD-10-CM

## 2017-07-05 DIAGNOSIS — C61 Malignant neoplasm of prostate: Secondary | ICD-10-CM

## 2017-07-05 DIAGNOSIS — I4891 Unspecified atrial fibrillation: Secondary | ICD-10-CM | POA: Diagnosis not present

## 2017-07-05 DIAGNOSIS — Z7901 Long term (current) use of anticoagulants: Secondary | ICD-10-CM | POA: Diagnosis not present

## 2017-07-05 DIAGNOSIS — Z9079 Acquired absence of other genital organ(s): Secondary | ICD-10-CM

## 2017-07-05 DIAGNOSIS — C779 Secondary and unspecified malignant neoplasm of lymph node, unspecified: Secondary | ICD-10-CM | POA: Diagnosis not present

## 2017-07-05 DIAGNOSIS — Z5111 Encounter for antineoplastic chemotherapy: Secondary | ICD-10-CM

## 2017-07-05 LAB — COMPREHENSIVE METABOLIC PANEL
ALBUMIN: 4.4 g/dL (ref 3.5–5.0)
ALK PHOS: 61 U/L (ref 38–126)
ALT: 10 U/L — ABNORMAL LOW (ref 17–63)
AST: 20 U/L (ref 15–41)
Anion gap: 10 (ref 5–15)
BUN: 37 mg/dL — ABNORMAL HIGH (ref 6–20)
CHLORIDE: 100 mmol/L — AB (ref 101–111)
CO2: 21 mmol/L — AB (ref 22–32)
Calcium: 9.6 mg/dL (ref 8.9–10.3)
Creatinine, Ser: 1.01 mg/dL (ref 0.61–1.24)
GFR calc Af Amer: >60 mL/min (ref >60)
GFR calc non Af Amer: >60 mL/min (ref >60)
GLUCOSE: 132 mg/dL — AB (ref 65–99)
POTASSIUM: 3.6 mmol/L (ref 3.5–5.1)
SODIUM: 131 mmol/L — AB (ref 135–145)
Total Bilirubin: 1.1 mg/dL (ref 0.3–1.2)
Total Protein: 7.2 g/dL (ref 6.5–8.1)

## 2017-07-05 LAB — CBC WITH DIFFERENTIAL/PLATELET
BASOS PCT: 0 %
Basophils Absolute: 0 10*3/uL (ref 0–0.1)
EOS ABS: 0 10*3/uL (ref 0–0.7)
Eosinophils Relative: 0 %
HCT: 35.3 % — ABNORMAL LOW (ref 40.0–52.0)
HEMOGLOBIN: 12.4 g/dL — AB (ref 13.0–18.0)
Lymphocytes Relative: 8 %
Lymphs Abs: 0.6 10*3/uL — ABNORMAL LOW (ref 1.0–3.6)
MCH: 31.5 pg (ref 26.0–34.0)
MCHC: 35.1 g/dL (ref 32.0–36.0)
MCV: 89.7 fL (ref 80.0–100.0)
MONOS PCT: 4 %
Monocytes Absolute: 0.3 10*3/uL (ref 0.2–1.0)
Neutro Abs: 6.4 10*3/uL (ref 1.4–6.5)
Neutrophils Relative %: 88 %
Platelets: 140 10*3/uL — ABNORMAL LOW (ref 150–440)
RBC: 3.94 MIL/uL — ABNORMAL LOW (ref 4.40–5.90)
RDW: 14.6 % — AB (ref 11.5–14.5)
WBC: 7.3 10*3/uL (ref 3.8–10.6)

## 2017-07-05 MED ORDER — SODIUM CHLORIDE 0.9 % IV SOLN
75.0000 mg/m2 | Freq: Once | INTRAVENOUS | Status: AC
  Administered 2017-07-05: 190 mg via INTRAVENOUS
  Filled 2017-07-05: qty 19

## 2017-07-05 MED ORDER — SODIUM CHLORIDE 0.9% FLUSH
10.0000 mL | INTRAVENOUS | Status: DC | PRN
  Administered 2017-07-05: 10 mL via INTRAVENOUS
  Filled 2017-07-05: qty 10

## 2017-07-05 MED ORDER — DEXAMETHASONE SODIUM PHOSPHATE 10 MG/ML IJ SOLN
10.0000 mg | Freq: Once | INTRAMUSCULAR | Status: AC
  Administered 2017-07-05: 10 mg via INTRAVENOUS
  Filled 2017-07-05: qty 1

## 2017-07-05 MED ORDER — HEPARIN SOD (PORK) LOCK FLUSH 100 UNIT/ML IV SOLN
500.0000 [IU] | Freq: Once | INTRAVENOUS | Status: AC
  Administered 2017-07-05: 500 [IU] via INTRAVENOUS

## 2017-07-05 MED ORDER — PEGFILGRASTIM 6 MG/0.6ML ~~LOC~~ PSKT
6.0000 mg | PREFILLED_SYRINGE | Freq: Once | SUBCUTANEOUS | Status: AC
  Administered 2017-07-05: 6 mg via SUBCUTANEOUS
  Filled 2017-07-05: qty 0.6

## 2017-07-05 MED ORDER — SODIUM CHLORIDE 0.9 % IV SOLN: 10.0000 mg | Freq: Once | INTRAVENOUS | Status: DC

## 2017-07-05 MED ORDER — SODIUM CHLORIDE 0.9 % IV SOLN
Freq: Once | INTRAVENOUS | Status: AC
  Administered 2017-07-05: 12:00:00 via INTRAVENOUS
  Filled 2017-07-05: qty 1000

## 2017-07-05 NOTE — Progress Notes (Signed)
Patient here for first treatment. 

## 2017-07-05 NOTE — Progress Notes (Signed)
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 He had biochemical recurrence in 2017 and was treated with IM RT. He again began to have a rising PSA in 2010 and underwent orchiectomy in 2010.his PSA was being monitored closely PSA a year ago was 0.5, 5 months ago was 3.9 in 2 months ago was 7.8.he underwent CT abdomen and pelvis in May 2018which showed abnormal periaortic and right common iliac lymph nodes. Index aortocaval lymph node 1.4 cm which was new as compared to February 2014 and suspicious for malignancy. Bone scan did not reveal any evidence of bony metastatic disease. He has been referred to [place] for systemic therapy options for CRPC  Patient lives with his wife and is independent of his ADL's. He has A fib for which he is on coumadin. Also has chronic urinary incontinence from prior prostate surgery and has AUS. Reports 50 pound intentional weight loss since October 2017  Repeat PSA in July 2018 was 13.3 indicating PSA doubling time of 3 months  Interval history- feels well. Denies any fatigue or pain.  ECOG PS- 1 Pain scale- 0   Review of systems- Review of Systems  Constitutional: Negative for chills, fever, malaise/fatigue and weight loss.  HENT: Negative for congestion, ear discharge and nosebleeds.   Eyes: Negative for blurred vision.  Respiratory: Negative for cough, hemoptysis, sputum production, shortness of breath  and wheezing.   Cardiovascular: Negative for chest pain, palpitations, orthopnea and claudication.  Gastrointestinal: Negative for abdominal pain, blood in stool, constipation, diarrhea, heartburn, melena, nausea and vomiting.  Genitourinary: Negative for dysuria, flank pain, frequency, hematuria and urgency.  Musculoskeletal: Negative for back pain, joint pain and myalgias.  Skin: Negative for rash.  Neurological: Negative for dizziness, tingling, focal weakness, seizures, weakness and headaches.  Endo/Heme/Allergies: Does not bruise/bleed easily.  Psychiatric/Behavioral: Negative for depression and suicidal ideas. The patient does not have insomnia.       Allergies  Allergen Reactions  . Latex Rash    Sensitivity, not allergy.     Past Medical History:  Diagnosis Date  . Arthritis, degenerative 10/30/2015  . Atrial fibrillation ([place]) 10/30/2015  . Bladder spasm 06/12/2013  . Carcinoma of prostate ([place]) 10/30/2015  . Cardiomyopathy ([place]) 09/10/2014   Overview:  EF 35%,4/15   . Cerebrovascular accident (CVA) ([place]) 10/30/2015   Overview:  Left embolic DXI,3382   . Difficult or painful urination 11/21/2013  . Dysrhythmia   . Gout 10/30/2015  . History of kidney stones   . Malignant neoplasm of prostate ([place]) 09/05/2012  . Sinoatrial node dysfunction ([facility]) 10/30/2015   Overview:  DDD pacemaker, 1996      Past Surgical History:  Procedure Laterality Date  . castration  10/2009  . CATARACT EXTRACTION W/ INTRAOCULAR LENS  IMPLANT, BILATERAL Bilateral 2016   right eye done then the left one done 3 months later in 2016  . INGUINAL HERNIA REPAIR Left   . INSERT / REPLACE / REMOVE PACEMAKER    . IR  FLUORO GUIDE PORT INSERTION RIGHT  07/01/2017  . JOINT REPLACEMENT Bilateral 1980   hips  . left knee replacement  2014  . PACEMAKER INSERTION    . PROSTATECTOMY    . REPLACEMENT TOTAL HIP W/  RESURFACING IMPLANTS  1997   bilateral-   . REPLACEMENT TOTAL KNEE Bilateral   . testes  removal     per pt   . TONSILLECTOMY AND ADENOIDECTOMY    . TRANSURETHRAL RESECTION OF BLADDER TUMOR N/A 12/31/2015   Procedure: BLADDER BIOPSY;  Surgeon: Nickie Retort, MD;  Location: [facility] ORS;  Service: Urology;  Laterality: N/A;  . Urinary incontinence valve     AMS    Social History   Social History  . Marital status: Married    Spouse name: N/A  . Number of children: N/A  . Years of education: N/A   Occupational History  . Not on file.   Social History Main Topics  . Smoking status: Never Smoker  . Smokeless tobacco: Never Used  . Alcohol use No  . Drug use: No  . Sexual activity: No   Other Topics Concern  . Not on file   Social History Narrative  . No narrative on file    Family History  Problem Relation Age of Onset  . Cancer Mother   . Arthritis Mother   . Heart attack Father   . Arthritis Father   . Transient ischemic attack Brother   . Prostate cancer Neg Hx   . Bladder Cancer Neg Hx      Current Outpatient Prescriptions:  .  allopurinol (ZYLOPRIM) 300 MG tablet, Take by mouth daily. In am., Disp: , Rfl:  .  dexamethasone (DECADRON) 4 MG tablet, Take 2 tablets (8 mg total) by mouth 2 (two) times daily. Start the day before Taxotere. Then daily after chemo for 2 days., Disp: 30 tablet, Rfl: 1 .  Glucosamine Sulfate 1000 MG CAPS, Take 1 capsule by mouth 2 (two) times daily. , Disp: , Rfl:  .  hydrochlorothiazide (HYDRODIURIL) 25 MG tablet, Take 25 mg by mouth. In am, Disp: , Rfl:  .  lidocaine-prilocaine (EMLA) cream, Apply to affected area once, Disp: 30 g, Rfl: 3 .  LORazepam (ATIVAN) 0.5 MG tablet, Take 1 tablet (0.5 mg total) by mouth every 6 (six) hours as needed (Nausea or vomiting)., Disp: 30 tablet, Rfl: 0 .  ondansetron (ZOFRAN) 8 MG tablet, Take 1 tablet (8 mg total) by mouth 2 (two) times daily as needed for refractory nausea / vomiting., Disp: 30 tablet, Rfl: 1 .  phentermine 15 MG capsule, Take 15 mg by mouth every morning., Disp: , Rfl:   .  prochlorperazine (COMPAZINE) 10 MG tablet, Take 1 tablet (10 mg total) by mouth every 6 (six) hours as needed (Nausea or vomiting)., Disp: 30 tablet, Rfl: 1 .  simvastatin (ZOCOR) 40 MG tablet, Take 40 mg by mouth daily at 6 PM. , Disp: , Rfl:  .  warfarin (COUMADIN) 2 MG tablet, Take 2 mg by mouth once a week. On saturdays., Disp: , Rfl:  .  warfarin (COUMADIN) 5 MG tablet, TAKE ONE (1) TABLET EACH DAY, Disp: , Rfl:   Current Facility-Administered Medications:  .  lidocaine (XYLOCAINE) 2 % jelly 1 application, 1 application, Urethral, Once, Nickie Retort, MD  Physical exam:  Vitals:   07/05/17 1021  BP: 127/78  Pulse: (!) 58  Temp: 97.6 F (36.4 C)  TempSrc: Tympanic  Weight: 275 lb 14.4 oz (125.1 kg)  Physical Exam  Constitutional: He is oriented to person, place, and time and well-developed, well-nourished, and in no distress.  HENT:  Head: Normocephalic and atraumatic.  Eyes: Pupils are equal, round, and reactive to light. EOM are normal.  Neck: Normal range of motion.  Cardiovascular: Normal rate, regular rhythm and normal heart sounds.   Pulmonary/Chest: Effort normal and breath sounds normal.  Abdominal: Soft. Bowel sounds are normal.  Musculoskeletal:  Surgical scar of right knee replacement  Neurological: He is alert and oriented to person, place, and time.  Skin: Skin is warm and dry.     CMP Latest Ref Rng & Units 07/05/2017  Glucose 65 - 99 mg/dL 132(H)  BUN 6 - 20 mg/dL 37(H)  Creatinine 0.61 - 1.24 mg/dL 1.01  Sodium 135 - 145 mmol/L 131(L)  Potassium 3.5 - 5.1 mmol/L 3.6  Chloride 101 - 111 mmol/L 100(L)  CO2 22 - 32 mmol/L 21(L)  Calcium 8.9 - 10.3 mg/dL 9.6  Total Protein 6.5 - 8.1 g/dL 7.2  Total Bilirubin 0.3 - 1.2 mg/dL 1.1  Alkaline Phos 38 - 126 U/L 61  AST 15 - 41 U/L 20  ALT 17 - 63 U/L 10(L)   CBC Latest Ref Rng & Units 07/05/2017  WBC 3.8 - 10.6 K/uL 7.3  Hemoglobin 13.0 - 18.0 g/dL 12.4(L)  Hematocrit 40.0 - 52.0 % 35.3(L)    Platelets 150 - 440 K/uL 140(L)    No images are attached to the encounter.  Ct Chest Wo Contrast  Result Date: 06/27/2017 CLINICAL DATA:  Prostate cancer. EXAM: CT CHEST WITHOUT CONTRAST TECHNIQUE: Multidetector CT imaging of the chest was performed following the standard protocol without IV contrast. COMPARISON:  None. FINDINGS: Cardiovascular: Heart size borderline increased. No pericardial effusion. Coronary artery calcification is noted. Atherosclerotic calcification is noted in the wall of the thoracic aorta. Left-sided permanent pacemaker. Mediastinum/Nodes: 10 mm short axis AP window lymph node. No other mediastinal lymphadenopathy. No evidence for gross hilar lymphadenopathy although assessment is limited by the lack of intravenous contrast on today's study. The esophagus has normal imaging features. There is no axillary lymphadenopathy. Lungs/Pleura: Tiny calcified granuloma identified posterior right upper lobe. Small focus of peribronchovascular nodularity identified posterior right costophrenic sulcus. Similar peribronchovascular nodularity identified posterior left lower lobe no focal airspace consolidation. No pulmonary edema or pleural effusion. Upper Abdomen: Masslike focal thickening have the interpolar left kidney noted (see image 166 series 2. 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 Musculoskeletal: Degenerative changes characterize both shoulders. Bilateral gynecomastia. IMPRESSION: 1. No evidence for metastatic disease in the chest. 2. Scattered areas of peribronchovascular nodularity in the lower lobes. Imaging features suggest atypical infection. 3. Masslike focal thickening of the interpolar left kidney. Incomplete visualization of the left kidney and noncontrast nature this study hinders assessment. While this is likely related to renal development, underlying mass lesion cannot be excluded. MRI of the abdomen without and with contrast could be used to further  evaluate. Electronically Signed   By: Misty Stanley M.D.   On: 06/27/2017 16:58   Ir Fluoro Guide Port Insertion Right  Result Date: 07/01/2017 CLINICAL DATA:  METASTATIC PROSTATE CANCER EXAM: RIGHT INTERNAL JUGULAR SINGLE LUMEN POWER PORT CATHETER INSERTION Date:  7/20/20187/20/2018 12:45 pm Radiologist:  M. Daryll Brod, MD Guidance:  Ultrasound and fluoroscopic MEDICATIONS: Ancef 2 g; The antibiotic was administered within an appropriate time interval prior to skin puncture. ANESTHESIA/SEDATION: Versed 2.0 mg IV; Fentanyl 75 mcg IV; Moderate Sedation Time:  31 minutes The  patient was continuously monitored during the procedure by the interventional radiology nurse under my direct supervision. FLUOROSCOPY TIME:  1.0 minutes, 18 seconds (25 mGy) COMPLICATIONS: None immediate. CONTRAST:  None. PROCEDURE: Informed consent was obtained from the patient following explanation of the procedure, risks, benefits and alternatives. The patient understands, agrees and consents for the procedure. All questions were addressed. A time out was performed. Maximal barrier sterile technique utilized including caps, mask, sterile gowns, sterile gloves, large sterile drape, hand hygiene, and 2% chlorhexidine scrub. Under sterile conditions and local anesthesia, right internal jugular micropuncture venous access was performed. Access was performed with ultrasound. Images were obtained for documentation. A guide wire was inserted followed by a transitional dilator. This allowed insertion of a guide wire and catheter into the IVC. Measurements were obtained from the SVC / RA junction back to the right IJ venotomy site. In the right infraclavicular chest, a subcutaneous pocket was created over the second anterior rib. This was done under sterile conditions and local anesthesia. 1% lidocaine with epinephrine was utilized for this. A 2.5 cm incision was made in the skin. Blunt dissection was performed to create a subcutaneous pocket  over the right pectoralis major muscle. The pocket was flushed with saline vigorously. There was adequate hemostasis. The port catheter was assembled and checked for leakage. The port catheter was secured in the pocket with two retention sutures. The tubing was tunneled subcutaneously to the right venotomy site and inserted into the SVC/RA junction through a valved peel-away sheath. Position was confirmed with fluoroscopy. Images were obtained for documentation. The patient tolerated the procedure well. No immediate complications. Incisions were closed in a two layer fashion with 4 - 0 Vicryl suture. Dermabond was applied to the skin. The port catheter was accessed, blood was aspirated followed by saline and heparin flushes. Needle was removed. A dry sterile dressing was applied. IMPRESSION: Ultrasound and fluoroscopically guided right internal jugular single lumen power port catheter insertion. Tip in the SVC/RA junction. Catheter ready for use. Electronically Signed   By: Jerilynn Mages.  Shick M.D.   On: 07/01/2017 12:57     Assessment and plan- Patient is a 71 y.o. male with castrate resistant prostate cancer and LN mets  Patient has undergone orchiectomy in the past and is therefore castrate resistant. He has a rapidly doubly PSA time of 3 months. He has non bulky intra abdominal adenopathy due to prostate cancer. I will theerfore proceed with palliatiev chemotherapy with docetaxel at 75mg /meter square cycle 1 today with neulasta support. If he has issues with onpro neulasta will switch to neulasta shots  He knows to take tylenol and claritin for neulasta associated bone pain  Prn nausea meds for chemo induced nausea prescribed  He is on coumadin for afib  I will see him back in 2 weeks with labs    Visit Diagnosis 1. Carcinoma of prostate ([place])   2. Regional lymph node metastasis present ([place])   3. Encounter for antineoplastic chemotherapy      Dr. Randa Evens, MD, MPH [facility] at [facility] Pager- [phone] 07/05/2017 2:25 PM

## 2017-07-21 ENCOUNTER — Inpatient Hospital Stay: Payer: PPO

## 2017-07-21 ENCOUNTER — Inpatient Hospital Stay: Payer: PPO | Attending: Oncology | Admitting: Oncology

## 2017-07-21 VITALS — BP 127/67 | HR 86 | Temp 97.7°F | Wt 273.7 lb

## 2017-07-21 DIAGNOSIS — Z79899 Other long term (current) drug therapy: Secondary | ICD-10-CM | POA: Diagnosis not present

## 2017-07-21 DIAGNOSIS — C61 Malignant neoplasm of prostate: Secondary | ICD-10-CM | POA: Diagnosis not present

## 2017-07-21 DIAGNOSIS — Z5111 Encounter for antineoplastic chemotherapy: Secondary | ICD-10-CM

## 2017-07-21 DIAGNOSIS — Z9079 Acquired absence of other genital organ(s): Secondary | ICD-10-CM | POA: Insufficient documentation

## 2017-07-21 DIAGNOSIS — Z96652 Presence of left artificial knee joint: Secondary | ICD-10-CM | POA: Insufficient documentation

## 2017-07-21 DIAGNOSIS — M109 Gout, unspecified: Secondary | ICD-10-CM | POA: Diagnosis not present

## 2017-07-21 DIAGNOSIS — C779 Secondary and unspecified malignant neoplasm of lymph node, unspecified: Secondary | ICD-10-CM | POA: Diagnosis not present

## 2017-07-21 DIAGNOSIS — Z8673 Personal history of transient ischemic attack (TIA), and cerebral infarction without residual deficits: Secondary | ICD-10-CM | POA: Diagnosis not present

## 2017-07-21 DIAGNOSIS — I429 Cardiomyopathy, unspecified: Secondary | ICD-10-CM | POA: Diagnosis not present

## 2017-07-21 DIAGNOSIS — N3289 Other specified disorders of bladder: Secondary | ICD-10-CM | POA: Diagnosis not present

## 2017-07-21 DIAGNOSIS — Z7901 Long term (current) use of anticoagulants: Secondary | ICD-10-CM | POA: Diagnosis not present

## 2017-07-21 DIAGNOSIS — Z950 Presence of cardiac pacemaker: Secondary | ICD-10-CM | POA: Insufficient documentation

## 2017-07-21 DIAGNOSIS — Z87442 Personal history of urinary calculi: Secondary | ICD-10-CM | POA: Diagnosis not present

## 2017-07-21 DIAGNOSIS — I4891 Unspecified atrial fibrillation: Secondary | ICD-10-CM | POA: Diagnosis not present

## 2017-07-21 LAB — CBC WITH DIFFERENTIAL/PLATELET
Basophils Absolute: 0 10*3/uL (ref 0–0.1)
Basophils Relative: 1 %
EOS PCT: 0 %
Eosinophils Absolute: 0 10*3/uL (ref 0–0.7)
HCT: 33.5 % — ABNORMAL LOW (ref 40.0–52.0)
Hemoglobin: 11.6 g/dL — ABNORMAL LOW (ref 13.0–18.0)
LYMPHS ABS: 0.9 10*3/uL — AB (ref 1.0–3.6)
LYMPHS PCT: 9 %
MCH: 31.1 pg (ref 26.0–34.0)
MCHC: 34.5 g/dL (ref 32.0–36.0)
MCV: 90 fL (ref 80.0–100.0)
MONO ABS: 0.5 10*3/uL (ref 0.2–1.0)
MONOS PCT: 5 %
Neutro Abs: 8.3 10*3/uL — ABNORMAL HIGH (ref 1.4–6.5)
Neutrophils Relative %: 85 %
PLATELETS: 142 10*3/uL — AB (ref 150–440)
RBC: 3.72 MIL/uL — ABNORMAL LOW (ref 4.40–5.90)
RDW: 14.9 % — AB (ref 11.5–14.5)
WBC: 9.7 10*3/uL (ref 3.8–10.6)

## 2017-07-21 LAB — COMPREHENSIVE METABOLIC PANEL
ALT: 13 U/L — ABNORMAL LOW (ref 17–63)
AST: 17 U/L (ref 15–41)
Albumin: 3.6 g/dL (ref 3.5–5.0)
Alkaline Phosphatase: 65 U/L (ref 38–126)
Anion gap: 7 (ref 5–15)
BILIRUBIN TOTAL: 0.8 mg/dL (ref 0.3–1.2)
BUN: 26 mg/dL — ABNORMAL HIGH (ref 6–20)
CHLORIDE: 105 mmol/L (ref 101–111)
CO2: 27 mmol/L (ref 22–32)
CREATININE: 0.99 mg/dL (ref 0.61–1.24)
Calcium: 9.5 mg/dL (ref 8.9–10.3)
Glucose, Bld: 93 mg/dL (ref 65–99)
POTASSIUM: 3.8 mmol/L (ref 3.5–5.1)
Sodium: 139 mmol/L (ref 135–145)
TOTAL PROTEIN: 6.7 g/dL (ref 6.5–8.1)

## 2017-07-21 NOTE — Progress Notes (Signed)
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 He had biochemical recurrence in 2017 and was treated with IM RT. He again began to have a rising PSA in 2010 and underwent orchiectomy in 2010.his PSA was being monitored closely PSA a year ago was 0.5, 5 months ago was 3.9 in 2 months ago was 7.8.he underwent CT abdomen and pelvis in May 2018which showed abnormal periaortic and right common iliac lymph nodes. Index aortocaval lymph node 1.4 cm which was new as compared to February 2014 and suspicious for malignancy. Bone scan did not reveal any evidence of bony metastatic disease. He has been referred to [place] for systemic therapy options for CRPC  Patient lives with his wife and is independent of his ADL's. He has A fib for which he is on coumadin. Also has chronic urinary incontinence from prior prostate surgery and has AUS. Reports 50 pound intentional weight loss since October 2017  Repeat PSA in July 2018 was 13.3 indicating PSA doubling time of 3 months   Interval history- reports fatigue which lasted for a couple of days post chemo. Also had b/l leg pain few days after chemo which resolved. Felt lightheadded on 2 occasions but did not pass out  ECOG PS- 0 Pain scale- 0  Review of systems- Review of Systems  Constitutional: Positive for malaise/fatigue. Negative for chills, fever and weight loss.  HENT: Negative for congestion, ear discharge and  nosebleeds.   Eyes: Negative for blurred vision.  Respiratory: Negative for cough, hemoptysis, sputum production, shortness of breath and wheezing.   Cardiovascular: Negative for chest pain, palpitations, orthopnea and claudication.  Gastrointestinal: Negative for abdominal pain, blood in stool, constipation, diarrhea, heartburn, melena, nausea and vomiting.  Genitourinary: Negative for dysuria, flank pain, frequency, hematuria and urgency.  Musculoskeletal: Negative for back pain, joint pain and myalgias.  Skin: Negative for rash.  Neurological: Negative for dizziness, tingling, focal weakness, seizures, weakness and headaches.  Endo/Heme/Allergies: Does not bruise/bleed easily.  Psychiatric/Behavioral: Negative for depression and suicidal ideas. The patient does not have insomnia.        Allergies  Allergen Reactions  . Latex Rash    Sensitivity, not allergy.     Past Medical History:  Diagnosis Date  . Arthritis, degenerative 10/30/2015  . Atrial fibrillation ([place]) 10/30/2015  . Bladder spasm 06/12/2013  . Carcinoma of prostate ([place]) 10/30/2015  . Cardiomyopathy ([place]) 09/10/2014   Overview:  EF 35%,4/15   . Cerebrovascular accident (CVA) ([place]) 10/30/2015   Overview:  Left embolic [id number]   . Difficult or painful urination 11/21/2013  . Dysrhythmia   . Gout 10/30/2015  . History of kidney stones   . Malignant neoplasm of prostate ([place]) 09/05/2012  . Sinoatrial node dysfunction (HCC) 10/30/2015   Overview:  DDD pacemaker, 1996      Past Surgical History:  Procedure Laterality Date  . castration  10/2009  . CATARACT EXTRACTION W/ INTRAOCULAR LENS  IMPLANT, BILATERAL Bilateral 2016   right eye done then the left one  done 3 months later in 2016  . INGUINAL HERNIA REPAIR Left   . INSERT / REPLACE / REMOVE PACEMAKER    . IR FLUORO GUIDE PORT INSERTION RIGHT  07/01/2017  . JOINT REPLACEMENT Bilateral 1980   hips  . left knee replacement  2014  . PACEMAKER INSERTION    .  PROSTATECTOMY    . REPLACEMENT TOTAL HIP W/  RESURFACING IMPLANTS  1997   bilateral-   . REPLACEMENT TOTAL KNEE Bilateral   . testes removal     per pt   . TONSILLECTOMY AND ADENOIDECTOMY    . TRANSURETHRAL RESECTION OF BLADDER TUMOR N/A 12/31/2015   Procedure: BLADDER BIOPSY;  Surgeon: Nickie Retort, MD;  Location: [facility] ORS;  Service: Urology;  Laterality: N/A;  . Urinary incontinence valve     AMS    Social History   Social History  . Marital status: Married    Spouse name: N/A  . Number of children: N/A  . Years of education: N/A   Occupational History  . Not on file.   Social History Main Topics  . Smoking status: Never Smoker  . Smokeless tobacco: Never Used  . Alcohol use No  . Drug use: No  . Sexual activity: No   Other Topics Concern  . Not on file   Social History Narrative  . No narrative on file    Family History  Problem Relation Age of Onset  . Cancer Mother   . Arthritis Mother   . Heart attack Father   . Arthritis Father   . Transient ischemic attack Brother   . Prostate cancer Neg Hx   . Bladder Cancer Neg Hx      Current Outpatient Prescriptions:  .  allopurinol (ZYLOPRIM) 300 MG tablet, Take by mouth daily. In am., Disp: , Rfl:  .  dexamethasone (DECADRON) 4 MG tablet, Take 2 tablets (8 mg total) by mouth 2 (two) times daily. Start the day before Taxotere. Then daily after chemo for 2 days., Disp: 30 tablet, Rfl: 1 .  Glucosamine Sulfate 1000 MG CAPS, Take 1 capsule by mouth 2 (two) times daily. , Disp: , Rfl:  .  hydrochlorothiazide (HYDRODIURIL) 25 MG tablet, Take 25 mg by mouth. In am, Disp: , Rfl:  .  lidocaine-prilocaine (EMLA) cream, Apply to affected area once, Disp: 30 g, Rfl: 3 .  LORazepam (ATIVAN) 0.5 MG tablet, Take 1 tablet (0.5 mg total) by mouth every 6 (six) hours as needed (Nausea or vomiting)., Disp: 30 tablet, Rfl: 0 .  ondansetron (ZOFRAN) 8 MG tablet, Take 1 tablet (8 mg total) by mouth 2 (two) times daily as needed  for refractory nausea / vomiting., Disp: 30 tablet, Rfl: 1 .  phentermine 15 MG capsule, Take 15 mg by mouth every morning., Disp: , Rfl:  .  prochlorperazine (COMPAZINE) 10 MG tablet, Take 1 tablet (10 mg total) by mouth every 6 (six) hours as needed (Nausea or vomiting)., Disp: 30 tablet, Rfl: 1 .  simvastatin (ZOCOR) 40 MG tablet, Take 40 mg by mouth daily at 6 PM. , Disp: , Rfl:  .  warfarin (COUMADIN) 2 MG tablet, Take 2 mg by mouth once a week. On saturdays., Disp: , Rfl:  .  warfarin (COUMADIN) 5 MG tablet, TAKE ONE (1) TABLET EACH DAY, Disp: , Rfl:   Current Facility-Administered Medications:  .  lidocaine (XYLOCAINE) 2 % jelly 1 application, 1 application, Urethral, Once, Nickie Retort, MD  Physical exam:  Vitals:   07/21/17  1354  BP: 127/67  Pulse: 86  Temp: 97.7 F (36.5 C)  TempSrc: Tympanic  Weight: 273 lb 11.2 oz (124.1 kg)   Physical Exam  Constitutional: He is oriented to person, place, and time and well-developed, well-nourished, and in no distress.  HENT:  Head: Normocephalic and atraumatic.  Eyes: Pupils are equal, round, and reactive to light. EOM are normal.  Neck: Normal range of motion.  Cardiovascular: Normal rate, regular rhythm and normal heart sounds.   Pulmonary/Chest: Effort normal and breath sounds normal.  Abdominal: Soft. Bowel sounds are normal.  Musculoskeletal:  Vertical scar of b/l knee surgery  Neurological: He is alert and oriented to person, place, and time.  Skin: Skin is warm and dry.     CMP Latest Ref Rng & Units 07/21/2017  Glucose 65 - 99 mg/dL 93  BUN 6 - 20 mg/dL 26(H)  Creatinine 0.61 - 1.24 mg/dL 0.99  Sodium 135 - 145 mmol/L 139  Potassium 3.5 - 5.1 mmol/L 3.8  Chloride 101 - 111 mmol/L 105  CO2 22 - 32 mmol/L 27  Calcium 8.9 - 10.3 mg/dL 9.5  Total Protein 6.5 - 8.1 g/dL 6.7  Total Bilirubin 0.3 - 1.2 mg/dL 0.8  Alkaline Phos 38 - 126 U/L 65  AST 15 - 41 U/L 17  ALT 17 - 63 U/L 13(L)   CBC Latest Ref Rng & Units  07/21/2017  WBC 3.8 - 10.6 K/uL 9.7  Hemoglobin 13.0 - 18.0 g/dL 11.6(L)  Hematocrit 40.0 - 52.0 % 33.5(L)  Platelets 150 - 440 K/uL 142(L)    No images are attached to the encounter.  Ct Chest Wo Contrast  Result Date: 06/27/2017 CLINICAL DATA:  Prostate cancer. EXAM: CT CHEST WITHOUT CONTRAST TECHNIQUE: Multidetector CT imaging of the chest was performed following the standard protocol without IV contrast. COMPARISON:  None. FINDINGS: Cardiovascular: Heart size borderline increased. No pericardial effusion. Coronary artery calcification is noted. Atherosclerotic calcification is noted in the wall of the thoracic aorta. Left-sided permanent pacemaker. Mediastinum/Nodes: 10 mm short axis AP window lymph node. No other mediastinal lymphadenopathy. No evidence for gross hilar lymphadenopathy although assessment is limited by the lack of intravenous contrast on today's study. The esophagus has normal imaging features. There is no axillary lymphadenopathy. Lungs/Pleura: Tiny calcified granuloma identified posterior right upper lobe. Small focus of peribronchovascular nodularity identified posterior right costophrenic sulcus. Similar peribronchovascular nodularity identified posterior left lower lobe no focal airspace consolidation. No pulmonary edema or pleural effusion. Upper Abdomen: Masslike focal thickening have the interpolar left kidney noted (see image 166 series 2. 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 Musculoskeletal: Degenerative changes characterize both shoulders. Bilateral gynecomastia. IMPRESSION: 1. No evidence for metastatic disease in the chest. 2. Scattered areas of peribronchovascular nodularity in the lower lobes. Imaging features suggest atypical infection. 3. Masslike focal thickening of the interpolar left kidney. Incomplete visualization of the left kidney and noncontrast nature this study hinders assessment. While this is likely related to renal development,  underlying mass lesion cannot be excluded. MRI of the abdomen without and with contrast could be used to further evaluate. Electronically Signed   By: Misty Stanley M.D.   On: 06/27/2017 16:58   Ir Fluoro Guide Port Insertion Right  Result Date: 07/01/2017 CLINICAL DATA:  METASTATIC PROSTATE CANCER EXAM: RIGHT INTERNAL JUGULAR SINGLE LUMEN POWER PORT CATHETER INSERTION Date:  7/20/20187/20/2018 12:45 pm Radiologist:  M. Daryll Brod, MD Guidance:  Ultrasound and fluoroscopic MEDICATIONS: Ancef 2 g; The antibiotic was administered  within an appropriate time interval prior to skin puncture. ANESTHESIA/SEDATION: Versed 2.0 mg IV; Fentanyl 75 mcg IV; Moderate Sedation Time:  31 minutes The patient was continuously monitored during the procedure by the interventional radiology nurse under my direct supervision. FLUOROSCOPY TIME:  1.0 minutes, 18 seconds (25 mGy) COMPLICATIONS: None immediate. CONTRAST:  None. PROCEDURE: Informed consent was obtained from the patient following explanation of the procedure, risks, benefits and alternatives. The patient understands, agrees and consents for the procedure. All questions were addressed. A time out was performed. Maximal barrier sterile technique utilized including caps, mask, sterile gowns, sterile gloves, large sterile drape, hand hygiene, and 2% chlorhexidine scrub. Under sterile conditions and local anesthesia, right internal jugular micropuncture venous access was performed. Access was performed with ultrasound. Images were obtained for documentation. A guide wire was inserted followed by a transitional dilator. This allowed insertion of a guide wire and catheter into the IVC. Measurements were obtained from the SVC / RA junction back to the right IJ venotomy site. In the right infraclavicular chest, a subcutaneous pocket was created over the second anterior rib. This was done under sterile conditions and local anesthesia. 1% lidocaine with epinephrine was utilized for  this. A 2.5 cm incision was made in the skin. Blunt dissection was performed to create a subcutaneous pocket over the right pectoralis major muscle. The pocket was flushed with saline vigorously. There was adequate hemostasis. The port catheter was assembled and checked for leakage. The port catheter was secured in the pocket with two retention sutures. The tubing was tunneled subcutaneously to the right venotomy site and inserted into the SVC/RA junction through a valved peel-away sheath. Position was confirmed with fluoroscopy. Images were obtained for documentation. The patient tolerated the procedure well. No immediate complications. Incisions were closed in a two layer fashion with 4 - 0 Vicryl suture. Dermabond was applied to the skin. The port catheter was accessed, blood was aspirated followed by saline and heparin flushes. Needle was removed. A dry sterile dressing was applied. IMPRESSION: Ultrasound and fluoroscopically guided right internal jugular single lumen power port catheter insertion. Tip in the SVC/RA junction. Catheter ready for use. Electronically Signed   By: Jerilynn Mages.  Shick M.D.   On: 07/01/2017 12:57     Assessment and plan- Patient is a 71 y.o. male with castrate resistant prostate cancer and LN mets here for 1 week assessment post docetaxel  Tolerated docetaxel relatively well. No significant side effects. He will proceed with cycle 2 of docetaxel with neulasta support on 07/26/17. chceck cbc, cmp that day  rtc on 08/16/17 with cbc, cmp and PSA for cycle 3 of docetaxel with neulasta support. He will call if there are any questions or concerns in the interim   Visit Diagnosis 1. Carcinoma of prostate ([place])   2. Encounter for antineoplastic chemotherapy      Dr. Randa Evens, MD, MPH [facility] at [facility] Pager- [phone] 07/21/2017 2:23 PM

## 2017-07-21 NOTE — Progress Notes (Signed)
Patient is here today for a follow up.  Patient states that after treatment, he felt very dizzy - and had strong muscle cramps and joint pain.

## 2017-07-26 ENCOUNTER — Telehealth: Payer: Self-pay | Admitting: *Deleted

## 2017-07-26 ENCOUNTER — Other Ambulatory Visit: Payer: Self-pay | Admitting: Oncology

## 2017-07-26 ENCOUNTER — Inpatient Hospital Stay: Payer: PPO

## 2017-07-26 VITALS — BP 110/74 | HR 87 | Temp 96.5°F | Resp 16

## 2017-07-26 DIAGNOSIS — C61 Malignant neoplasm of prostate: Secondary | ICD-10-CM

## 2017-07-26 DIAGNOSIS — C779 Secondary and unspecified malignant neoplasm of lymph node, unspecified: Secondary | ICD-10-CM | POA: Diagnosis not present

## 2017-07-26 LAB — CBC WITH DIFFERENTIAL/PLATELET
BASOS ABS: 0 10*3/uL (ref 0–0.1)
Basophils Relative: 0 %
EOS ABS: 0 10*3/uL (ref 0–0.7)
EOS PCT: 0 %
HCT: 33.1 % — ABNORMAL LOW (ref 40.0–52.0)
Hemoglobin: 11.5 g/dL — ABNORMAL LOW (ref 13.0–18.0)
Lymphocytes Relative: 6 %
Lymphs Abs: 0.5 10*3/uL — ABNORMAL LOW (ref 1.0–3.6)
MCH: 31.1 pg (ref 26.0–34.0)
MCHC: 34.8 g/dL (ref 32.0–36.0)
MCV: 89.4 fL (ref 80.0–100.0)
MONO ABS: 0.1 10*3/uL — AB (ref 0.2–1.0)
Monocytes Relative: 1 %
Neutro Abs: 7.8 10*3/uL — ABNORMAL HIGH (ref 1.4–6.5)
Neutrophils Relative %: 93 %
PLATELETS: 159 10*3/uL (ref 150–440)
RBC: 3.71 MIL/uL — AB (ref 4.40–5.90)
RDW: 15.4 % — AB (ref 11.5–14.5)
WBC: 8.4 10*3/uL (ref 3.8–10.6)

## 2017-07-26 LAB — COMPREHENSIVE METABOLIC PANEL
ALK PHOS: 64 U/L (ref 38–126)
ALT: 14 U/L — AB (ref 17–63)
AST: 24 U/L (ref 15–41)
Albumin: 4.1 g/dL (ref 3.5–5.0)
Anion gap: 10 (ref 5–15)
BILIRUBIN TOTAL: 0.9 mg/dL (ref 0.3–1.2)
BUN: 32 mg/dL — ABNORMAL HIGH (ref 6–20)
CALCIUM: 9.4 mg/dL (ref 8.9–10.3)
CO2: 23 mmol/L (ref 22–32)
CREATININE: 1.03 mg/dL (ref 0.61–1.24)
Chloride: 103 mmol/L (ref 101–111)
Glucose, Bld: 149 mg/dL — ABNORMAL HIGH (ref 65–99)
Potassium: 4 mmol/L (ref 3.5–5.1)
SODIUM: 136 mmol/L (ref 135–145)
TOTAL PROTEIN: 6.9 g/dL (ref 6.5–8.1)

## 2017-07-26 MED ORDER — DEXAMETHASONE SODIUM PHOSPHATE 10 MG/ML IJ SOLN
10.0000 mg | Freq: Once | INTRAMUSCULAR | Status: AC
  Administered 2017-07-26: 10 mg via INTRAVENOUS
  Filled 2017-07-26: qty 1

## 2017-07-26 MED ORDER — PEGFILGRASTIM 6 MG/0.6ML ~~LOC~~ PSKT
6.0000 mg | PREFILLED_SYRINGE | Freq: Once | SUBCUTANEOUS | Status: AC
  Administered 2017-07-26: 6 mg via SUBCUTANEOUS
  Filled 2017-07-26: qty 0.6

## 2017-07-26 MED ORDER — SODIUM CHLORIDE 0.9 % IV SOLN
Freq: Once | INTRAVENOUS | Status: AC
  Administered 2017-07-26: 11:00:00 via INTRAVENOUS
  Filled 2017-07-26: qty 1000

## 2017-07-26 MED ORDER — HEPARIN SOD (PORK) LOCK FLUSH 100 UNIT/ML IV SOLN
500.0000 [IU] | Freq: Once | INTRAVENOUS | Status: AC | PRN
  Administered 2017-07-26: 500 [IU]
  Filled 2017-07-26: qty 5

## 2017-07-26 MED ORDER — SODIUM CHLORIDE 0.9% FLUSH
10.0000 mL | INTRAVENOUS | Status: DC | PRN
  Filled 2017-07-26: qty 10

## 2017-07-26 MED ORDER — SODIUM CHLORIDE 0.9 % IV SOLN
75.0000 mg/m2 | Freq: Once | INTRAVENOUS | Status: AC
  Administered 2017-07-26: 190 mg via INTRAVENOUS
  Filled 2017-07-26: qty 19

## 2017-07-26 NOTE — Telephone Encounter (Signed)
Patient called and said he forgot to take his dexamethasone yesterday and is asking what he is to do since he is for chemo today. Please advise

## 2017-07-26 NOTE — Telephone Encounter (Signed)
Per VO Dr Tasia Catchings , patient advised to come for appt as scheduled and she will give Dex IV

## 2017-08-16 ENCOUNTER — Inpatient Hospital Stay: Payer: PPO | Attending: Oncology | Admitting: Oncology

## 2017-08-16 ENCOUNTER — Inpatient Hospital Stay: Payer: PPO

## 2017-08-16 ENCOUNTER — Encounter: Payer: Self-pay | Admitting: Oncology

## 2017-08-16 VITALS — BP 148/78 | HR 65 | Temp 96.9°F | Resp 18 | Wt 289.2 lb

## 2017-08-16 DIAGNOSIS — Z8673 Personal history of transient ischemic attack (TIA), and cerebral infarction without residual deficits: Secondary | ICD-10-CM | POA: Diagnosis not present

## 2017-08-16 DIAGNOSIS — M109 Gout, unspecified: Secondary | ICD-10-CM | POA: Insufficient documentation

## 2017-08-16 DIAGNOSIS — C61 Malignant neoplasm of prostate: Secondary | ICD-10-CM

## 2017-08-16 DIAGNOSIS — E876 Hypokalemia: Secondary | ICD-10-CM | POA: Diagnosis not present

## 2017-08-16 DIAGNOSIS — Z9841 Cataract extraction status, right eye: Secondary | ICD-10-CM | POA: Insufficient documentation

## 2017-08-16 DIAGNOSIS — Z5111 Encounter for antineoplastic chemotherapy: Secondary | ICD-10-CM | POA: Diagnosis not present

## 2017-08-16 DIAGNOSIS — I429 Cardiomyopathy, unspecified: Secondary | ICD-10-CM | POA: Insufficient documentation

## 2017-08-16 DIAGNOSIS — Z7901 Long term (current) use of anticoagulants: Secondary | ICD-10-CM | POA: Insufficient documentation

## 2017-08-16 DIAGNOSIS — Z950 Presence of cardiac pacemaker: Secondary | ICD-10-CM | POA: Insufficient documentation

## 2017-08-16 DIAGNOSIS — Z79899 Other long term (current) drug therapy: Secondary | ICD-10-CM | POA: Diagnosis not present

## 2017-08-16 DIAGNOSIS — I482 Chronic atrial fibrillation: Secondary | ICD-10-CM | POA: Insufficient documentation

## 2017-08-16 DIAGNOSIS — Z96652 Presence of left artificial knee joint: Secondary | ICD-10-CM | POA: Insufficient documentation

## 2017-08-16 DIAGNOSIS — C779 Secondary and unspecified malignant neoplasm of lymph node, unspecified: Secondary | ICD-10-CM | POA: Diagnosis not present

## 2017-08-16 DIAGNOSIS — Z9079 Acquired absence of other genital organ(s): Secondary | ICD-10-CM | POA: Insufficient documentation

## 2017-08-16 DIAGNOSIS — G629 Polyneuropathy, unspecified: Secondary | ICD-10-CM | POA: Insufficient documentation

## 2017-08-16 DIAGNOSIS — Z7689 Persons encountering health services in other specified circumstances: Secondary | ICD-10-CM | POA: Insufficient documentation

## 2017-08-16 DIAGNOSIS — Z87442 Personal history of urinary calculi: Secondary | ICD-10-CM | POA: Diagnosis not present

## 2017-08-16 LAB — CBC WITH DIFFERENTIAL/PLATELET
Basophils Absolute: 0 10*3/uL (ref 0–0.1)
Basophils Relative: 0 %
EOS PCT: 0 %
Eosinophils Absolute: 0 10*3/uL (ref 0–0.7)
HEMATOCRIT: 28.5 % — AB (ref 40.0–52.0)
Hemoglobin: 10.1 g/dL — ABNORMAL LOW (ref 13.0–18.0)
LYMPHS ABS: 0.5 10*3/uL — AB (ref 1.0–3.6)
LYMPHS PCT: 6 %
MCH: 31.5 pg (ref 26.0–34.0)
MCHC: 35.3 g/dL (ref 32.0–36.0)
MCV: 89.2 fL (ref 80.0–100.0)
MONO ABS: 0.2 10*3/uL (ref 0.2–1.0)
MONOS PCT: 3 %
NEUTROS ABS: 6.6 10*3/uL — AB (ref 1.4–6.5)
Neutrophils Relative %: 91 %
PLATELETS: 123 10*3/uL — AB (ref 150–440)
RBC: 3.2 MIL/uL — ABNORMAL LOW (ref 4.40–5.90)
RDW: 16.6 % — AB (ref 11.5–14.5)
WBC: 7.3 10*3/uL (ref 3.8–10.6)

## 2017-08-16 LAB — COMPREHENSIVE METABOLIC PANEL
ALT: 13 U/L — ABNORMAL LOW (ref 17–63)
ANION GAP: 11 (ref 5–15)
AST: 30 U/L (ref 15–41)
Albumin: 3.9 g/dL (ref 3.5–5.0)
Alkaline Phosphatase: 50 U/L (ref 38–126)
BILIRUBIN TOTAL: 0.6 mg/dL (ref 0.3–1.2)
BUN: 31 mg/dL — AB (ref 6–20)
CHLORIDE: 104 mmol/L (ref 101–111)
CO2: 21 mmol/L — ABNORMAL LOW (ref 22–32)
Calcium: 9.4 mg/dL (ref 8.9–10.3)
Creatinine, Ser: 0.98 mg/dL (ref 0.61–1.24)
GFR calc Af Amer: >60 mL/min (ref >60)
Glucose, Bld: 196 mg/dL — ABNORMAL HIGH (ref 65–99)
POTASSIUM: 3.2 mmol/L — AB (ref 3.5–5.1)
Sodium: 136 mmol/L (ref 135–145)
TOTAL PROTEIN: 6.8 g/dL (ref 6.5–8.1)

## 2017-08-16 LAB — PSA: PROSTATIC SPECIFIC ANTIGEN: 9.65 ng/mL — AB (ref 0.00–4.00)

## 2017-08-16 MED ORDER — SODIUM CHLORIDE 0.9 % IV SOLN
190.0000 mg | Freq: Once | INTRAVENOUS | Status: AC
  Administered 2017-08-16: 190 mg via INTRAVENOUS
  Filled 2017-08-16: qty 19

## 2017-08-16 MED ORDER — SODIUM CHLORIDE 0.9 % IV SOLN
Freq: Once | INTRAVENOUS | Status: AC
  Administered 2017-08-16: 15:00:00 via INTRAVENOUS
  Filled 2017-08-16: qty 1000

## 2017-08-16 MED ORDER — DEXAMETHASONE SODIUM PHOSPHATE 10 MG/ML IJ SOLN
10.0000 mg | Freq: Once | INTRAMUSCULAR | Status: AC
  Administered 2017-08-16: 10 mg via INTRAVENOUS
  Filled 2017-08-16: qty 1

## 2017-08-16 MED ORDER — PEGFILGRASTIM 6 MG/0.6ML ~~LOC~~ PSKT
6.0000 mg | PREFILLED_SYRINGE | Freq: Once | SUBCUTANEOUS | Status: AC
  Administered 2017-08-16: 6 mg via SUBCUTANEOUS
  Filled 2017-08-16: qty 0.6

## 2017-08-16 MED ORDER — SODIUM CHLORIDE 0.9% FLUSH
10.0000 mL | INTRAVENOUS | Status: DC | PRN
  Administered 2017-08-16: 10 mL
  Filled 2017-08-16: qty 10

## 2017-08-16 MED ORDER — HEPARIN SOD (PORK) LOCK FLUSH 100 UNIT/ML IV SOLN
500.0000 [IU] | Freq: Once | INTRAVENOUS | Status: AC | PRN
  Administered 2017-08-16: 500 [IU]
  Filled 2017-08-16: qty 5

## 2017-08-16 MED ORDER — DOCETAXEL CHEMO INJECTION 160 MG/16ML
75.0000 mg/m2 | Freq: Once | INTRAVENOUS | Status: DC
  Filled 2017-08-16: qty 19

## 2017-08-16 NOTE — Progress Notes (Signed)
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 He had biochemical recurrence in 2017 and was treated with IM RT. He again began to have a rising PSA in 2010 and underwent orchiectomy in 2010.his PSA was being monitored closely PSA a year ago was 0.5, 5 months ago was 3.9 in 2 months ago was 7.8.he underwent CT abdomen and pelvis in May 2018which showed abnormal periaortic and right common iliac lymph nodes. Index aortocaval lymph node 1.4 cm which was new as compared to February 2014 and suspicious for malignancy. Bone scan did not reveal any evidence of bony metastatic disease. He has been referred to [place] for systemic therapy options for CRPC  Patient lives with his wife and is independent of his ADL's. He has A fib for which he is on coumadin. Also has chronic urinary incontinence from prior prostate surgery and has AUS. Reports 50 pound intentional weight loss since October 2017  Repeat PSA in July 2018 was 13.3 indicating PSA doubling time of 3 months  Interval history- had dizziness lasting for 2 days post chemo. He felt light headed but did not fall. He is concerned about weight gain and would like to go back on phentermine  ECOG PS- 1 Pain scale- 0   Review of systems- Review of Systems  Constitutional: Negative for chills, fever, malaise/fatigue and weight loss.  HENT: Negative for congestion, ear discharge and nosebleeds.   Eyes: Negative for blurred vision.   Respiratory: Negative for cough, hemoptysis, sputum production, shortness of breath and wheezing.   Cardiovascular: Negative for chest pain, palpitations, orthopnea and claudication.  Gastrointestinal: Negative for abdominal pain, blood in stool, constipation, diarrhea, heartburn, melena, nausea and vomiting.  Genitourinary: Negative for dysuria, flank pain, frequency, hematuria and urgency.  Musculoskeletal: Negative for back pain, joint pain and myalgias.  Skin: Negative for rash.  Neurological: Negative for dizziness, tingling, focal weakness, seizures, weakness and headaches.  Endo/Heme/Allergies: Does not bruise/bleed easily.  Psychiatric/Behavioral: Negative for depression and suicidal ideas. The patient does not have insomnia.      Current treatment- docetaxel  Allergies  Allergen Reactions  . Latex Rash    Sensitivity, not allergy.     Past Medical History:  Diagnosis Date  . Arthritis, degenerative 10/30/2015  . Atrial fibrillation ([place]) 10/30/2015  . Bladder spasm 06/12/2013  . Carcinoma of prostate ([place]) 10/30/2015  . Cardiomyopathy ([place]) 09/10/2014   Overview:  EF 35%,4/15   . Cerebrovascular accident (CVA) ([place]) 10/30/2015   Overview:  Left embolic [id number]   . Difficult or painful urination 11/21/2013  . Dysrhythmia   . Gout 10/30/2015  . History of kidney stones   . Malignant neoplasm of prostate ([place]) 09/05/2012  . Sinoatrial node dysfunction ([facility]) 10/30/2015   Overview:  DDD pacemaker, 1996      Past Surgical History:  Procedure Laterality Date  . castration  10/2009  . CATARACT EXTRACTION W/ INTRAOCULAR LENS  IMPLANT, BILATERAL Bilateral 2016   right eye done then the left one done 3 months later  in 2016  . INGUINAL HERNIA REPAIR Left   . INSERT / REPLACE / REMOVE PACEMAKER    . IR FLUORO GUIDE PORT INSERTION RIGHT  07/01/2017  . JOINT REPLACEMENT Bilateral 1980   hips  . left knee replacement  2014  . PACEMAKER INSERTION    . PROSTATECTOMY    .  REPLACEMENT TOTAL HIP W/  RESURFACING IMPLANTS  1997   bilateral-   . REPLACEMENT TOTAL KNEE Bilateral   . testes removal     per pt   . TONSILLECTOMY AND ADENOIDECTOMY    . TRANSURETHRAL RESECTION OF BLADDER TUMOR N/A 12/31/2015   Procedure: BLADDER BIOPSY;  Surgeon: Nickie Retort, MD;  Location: [facility] ORS;  Service: Urology;  Laterality: N/A;  . Urinary incontinence valve     AMS    Social History   Social History  . Marital status: Married    Spouse name: N/A  . Number of children: N/A  . Years of education: N/A   Occupational History  . Not on file.   Social History Main Topics  . Smoking status: Never Smoker  . Smokeless tobacco: Never Used  . Alcohol use No  . Drug use: No  . Sexual activity: No   Other Topics Concern  . Not on file   Social History Narrative  . No narrative on file    Family History  Problem Relation Age of Onset  . Cancer Mother   . Arthritis Mother   . Heart attack Father   . Arthritis Father   . Transient ischemic attack Brother   . Prostate cancer Neg Hx   . Bladder Cancer Neg Hx      Current Outpatient Prescriptions:  .  allopurinol (ZYLOPRIM) 300 MG tablet, Take by mouth daily. In am., Disp: , Rfl:  .  dexamethasone (DECADRON) 4 MG tablet, Take 2 tablets (8 mg total) by mouth 2 (two) times daily. Start the day before Taxotere. Then daily after chemo for 2 days., Disp: 30 tablet, Rfl: 1 .  Glucosamine Sulfate 1000 MG CAPS, Take 1 capsule by mouth 2 (two) times daily. , Disp: , Rfl:  .  hydrochlorothiazide (HYDRODIURIL) 25 MG tablet, Take 25 mg by mouth. In am, Disp: , Rfl:  .  lidocaine-prilocaine (EMLA) cream, Apply to affected area once, Disp: 30 g, Rfl: 3 .  LORazepam (ATIVAN) 0.5 MG tablet, Take 1 tablet (0.5 mg total) by mouth every 6 (six) hours as needed (Nausea or vomiting)., Disp: 30 tablet, Rfl: 0 .  ondansetron (ZOFRAN) 8 MG tablet, Take 1 tablet (8 mg total) by mouth 2 (two) times daily as needed for refractory nausea  / vomiting., Disp: 30 tablet, Rfl: 1 .  phentermine 15 MG capsule, Take 15 mg by mouth every morning., Disp: , Rfl:  .  prochlorperazine (COMPAZINE) 10 MG tablet, Take 1 tablet (10 mg total) by mouth every 6 (six) hours as needed (Nausea or vomiting)., Disp: 30 tablet, Rfl: 1 .  simvastatin (ZOCOR) 40 MG tablet, Take 40 mg by mouth daily at 6 PM. , Disp: , Rfl:  .  warfarin (COUMADIN) 2 MG tablet, Take 2 mg by mouth once a week. On saturdays., Disp: , Rfl:  .  warfarin (COUMADIN) 5 MG tablet, TAKE ONE (1) TABLET EACH DAY, Disp: , Rfl:   Current Facility-Administered Medications:  .  lidocaine (XYLOCAINE) 2 % jelly 1 application, 1 application, Urethral, Once, Nickie Retort, MD  Physical exam:  Vitals:   08/16/17 1352  BP: Marland Kitchen)  148/78  Pulse: 65  Resp: 18  Temp: (!) 96.9 F (36.1 C)  TempSrc: Tympanic  Weight: 289 lb 3.2 oz (131.2 kg)   Physical Exam  Constitutional: He is oriented to person, place, and time and well-developed, well-nourished, and in no distress.  HENT:  Head: Normocephalic and atraumatic.  Eyes: Pupils are equal, round, and reactive to light. EOM are normal.  Neck: Normal range of motion.  Cardiovascular: Normal rate, regular rhythm and normal heart sounds.   Pulmonary/Chest: Effort normal and breath sounds normal.  Abdominal: Soft. Bowel sounds are normal.  Musculoskeletal: He exhibits edema (RLE edema chronic).  Neurological: He is alert and oriented to person, place, and time.  Skin: Skin is warm and dry.     CMP Latest Ref Rng & Units 08/16/2017  Glucose 65 - 99 mg/dL 196(H)  BUN 6 - 20 mg/dL 31(H)  Creatinine 0.61 - 1.24 mg/dL 0.98  Sodium 135 - 145 mmol/L 136  Potassium 3.5 - 5.1 mmol/L 3.2(L)  Chloride 101 - 111 mmol/L 104  CO2 22 - 32 mmol/L 21(L)  Calcium 8.9 - 10.3 mg/dL 9.4  Total Protein 6.5 - 8.1 g/dL 6.8  Total Bilirubin 0.3 - 1.2 mg/dL 0.6  Alkaline Phos 38 - 126 U/L 50  AST 15 - 41 U/L 30  ALT 17 - 63 U/L 13(L)   CBC Latest Ref Rng  & Units 08/16/2017  WBC 3.8 - 10.6 K/uL 7.3  Hemoglobin 13.0 - 18.0 g/dL 10.1(L)  Hematocrit 40.0 - 52.0 % 28.5(L)  Platelets 150 - 440 K/uL 123(L)      Assessment and plan- Patient is a 71 y.o. male castrate resistant prostate cancer and LN mets here for on treatment assessment prior to cycle 3 of docetaxel  Counts ok to proceed with cycle 3 of docetaxel today. rtc in 3 weeks with cbc, cmp for cycle 4 and MD visit  Hypokalemia- he will get 40 IV potassium tomorrow and we will recheck K and mag next week  Weight gain- no noted drug interaction of docetaxel with phentermine. Ok to go back on phentermine   Visit Diagnosis 1. Malignant neoplasm of prostate ([place])   2. Encounter for antineoplastic chemotherapy      Dr. Randa Evens, MD, MPH [facility] at [facility] Pager- [phone] 08/16/2017 3:04 PM

## 2017-08-16 NOTE — Progress Notes (Signed)
Here for follow up

## 2017-08-17 ENCOUNTER — Inpatient Hospital Stay: Payer: PPO

## 2017-08-17 DIAGNOSIS — E876 Hypokalemia: Secondary | ICD-10-CM

## 2017-08-17 DIAGNOSIS — Z5111 Encounter for antineoplastic chemotherapy: Secondary | ICD-10-CM | POA: Diagnosis not present

## 2017-08-17 MED ORDER — SODIUM CHLORIDE 0.9 % IV SOLN
Freq: Once | INTRAVENOUS | Status: AC
  Administered 2017-08-17: 14:00:00 via INTRAVENOUS
  Filled 2017-08-17: qty 500

## 2017-08-17 MED ORDER — HEPARIN SOD (PORK) LOCK FLUSH 100 UNIT/ML IV SOLN
INTRAVENOUS | Status: AC
  Filled 2017-08-17: qty 5

## 2017-08-17 MED ORDER — HEPARIN SOD (PORK) LOCK FLUSH 100 UNIT/ML IV SOLN
500.0000 [IU] | Freq: Once | INTRAVENOUS | Status: AC
  Administered 2017-08-17: 500 [IU] via INTRAVENOUS

## 2017-08-23 ENCOUNTER — Inpatient Hospital Stay: Payer: PPO

## 2017-08-23 DIAGNOSIS — Z5111 Encounter for antineoplastic chemotherapy: Secondary | ICD-10-CM | POA: Diagnosis not present

## 2017-08-23 DIAGNOSIS — C61 Malignant neoplasm of prostate: Secondary | ICD-10-CM

## 2017-08-23 LAB — COMPREHENSIVE METABOLIC PANEL
ALT: 15 U/L — ABNORMAL LOW (ref 17–63)
AST: 19 U/L (ref 15–41)
Albumin: 3.8 g/dL (ref 3.5–5.0)
Alkaline Phosphatase: 62 U/L (ref 38–126)
Anion gap: 9 (ref 5–15)
BILIRUBIN TOTAL: 1.7 mg/dL — AB (ref 0.3–1.2)
BUN: 20 mg/dL (ref 6–20)
CALCIUM: 9.2 mg/dL (ref 8.9–10.3)
CO2: 25 mmol/L (ref 22–32)
CREATININE: 1.01 mg/dL (ref 0.61–1.24)
Chloride: 97 mmol/L — ABNORMAL LOW (ref 101–111)
GFR calc Af Amer: >60 mL/min (ref >60)
Glucose, Bld: 113 mg/dL — ABNORMAL HIGH (ref 65–99)
POTASSIUM: 4 mmol/L (ref 3.5–5.1)
Sodium: 131 mmol/L — ABNORMAL LOW (ref 135–145)
TOTAL PROTEIN: 6.6 g/dL (ref 6.5–8.1)

## 2017-08-23 LAB — MAGNESIUM: MAGNESIUM: 1.4 mg/dL — AB (ref 1.7–2.4)

## 2017-09-06 ENCOUNTER — Inpatient Hospital Stay (HOSPITAL_BASED_OUTPATIENT_CLINIC_OR_DEPARTMENT_OTHER): Payer: PPO | Admitting: Oncology

## 2017-09-06 ENCOUNTER — Inpatient Hospital Stay: Payer: PPO

## 2017-09-06 VITALS — BP 153/78 | HR 97 | Temp 95.8°F | Resp 18 | Wt 279.0 lb

## 2017-09-06 DIAGNOSIS — C61 Malignant neoplasm of prostate: Secondary | ICD-10-CM

## 2017-09-06 DIAGNOSIS — Z79899 Other long term (current) drug therapy: Secondary | ICD-10-CM

## 2017-09-06 DIAGNOSIS — Z5111 Encounter for antineoplastic chemotherapy: Secondary | ICD-10-CM | POA: Diagnosis not present

## 2017-09-06 DIAGNOSIS — Z7901 Long term (current) use of anticoagulants: Secondary | ICD-10-CM

## 2017-09-06 DIAGNOSIS — I482 Chronic atrial fibrillation, unspecified: Secondary | ICD-10-CM

## 2017-09-06 DIAGNOSIS — G629 Polyneuropathy, unspecified: Secondary | ICD-10-CM | POA: Diagnosis not present

## 2017-09-06 DIAGNOSIS — C779 Secondary and unspecified malignant neoplasm of lymph node, unspecified: Secondary | ICD-10-CM | POA: Diagnosis not present

## 2017-09-06 DIAGNOSIS — E876 Hypokalemia: Secondary | ICD-10-CM | POA: Diagnosis not present

## 2017-09-06 LAB — CBC WITH DIFFERENTIAL/PLATELET
BASOS ABS: 0 10*3/uL (ref 0–0.1)
Basophils Relative: 0 %
EOS PCT: 0 %
Eosinophils Absolute: 0 10*3/uL (ref 0–0.7)
HEMATOCRIT: 29 % — AB (ref 40.0–52.0)
Hemoglobin: 10.1 g/dL — ABNORMAL LOW (ref 13.0–18.0)
LYMPHS ABS: 0.6 10*3/uL — AB (ref 1.0–3.6)
LYMPHS PCT: 5 %
MCH: 31.4 pg (ref 26.0–34.0)
MCHC: 34.8 g/dL (ref 32.0–36.0)
MCV: 90.3 fL (ref 80.0–100.0)
MONO ABS: 0.4 10*3/uL (ref 0.2–1.0)
MONOS PCT: 3 %
NEUTROS ABS: 11.1 10*3/uL — AB (ref 1.4–6.5)
Neutrophils Relative %: 92 %
PLATELETS: 147 10*3/uL — AB (ref 150–440)
RBC: 3.21 MIL/uL — ABNORMAL LOW (ref 4.40–5.90)
RDW: 18.9 % — AB (ref 11.5–14.5)
WBC: 12.1 10*3/uL — ABNORMAL HIGH (ref 3.8–10.6)

## 2017-09-06 LAB — COMPREHENSIVE METABOLIC PANEL
ALT: 14 U/L — ABNORMAL LOW (ref 17–63)
ANION GAP: 11 (ref 5–15)
AST: 27 U/L (ref 15–41)
Albumin: 4.2 g/dL (ref 3.5–5.0)
Alkaline Phosphatase: 65 U/L (ref 38–126)
BILIRUBIN TOTAL: 1 mg/dL (ref 0.3–1.2)
BUN: 39 mg/dL — AB (ref 6–20)
CHLORIDE: 99 mmol/L — AB (ref 101–111)
CO2: 23 mmol/L (ref 22–32)
Calcium: 9.3 mg/dL (ref 8.9–10.3)
Creatinine, Ser: 1.14 mg/dL (ref 0.61–1.24)
Glucose, Bld: 162 mg/dL — ABNORMAL HIGH (ref 65–99)
POTASSIUM: 3.4 mmol/L — AB (ref 3.5–5.1)
Sodium: 133 mmol/L — ABNORMAL LOW (ref 135–145)
TOTAL PROTEIN: 6.9 g/dL (ref 6.5–8.1)

## 2017-09-06 MED ORDER — HEPARIN SOD (PORK) LOCK FLUSH 100 UNIT/ML IV SOLN
500.0000 [IU] | Freq: Once | INTRAVENOUS | Status: AC
  Administered 2017-09-06: 500 [IU] via INTRAVENOUS

## 2017-09-06 MED ORDER — PEGFILGRASTIM 6 MG/0.6ML ~~LOC~~ PSKT
6.0000 mg | PREFILLED_SYRINGE | Freq: Once | SUBCUTANEOUS | Status: AC
  Administered 2017-09-06: 6 mg via SUBCUTANEOUS
  Filled 2017-09-06: qty 0.6

## 2017-09-06 MED ORDER — SODIUM CHLORIDE 0.9 % IV SOLN
75.0000 mg/m2 | Freq: Once | INTRAVENOUS | Status: AC
  Administered 2017-09-06: 190 mg via INTRAVENOUS
  Filled 2017-09-06: qty 19

## 2017-09-06 MED ORDER — SODIUM CHLORIDE 0.9 % IV SOLN
Freq: Once | INTRAVENOUS | Status: AC
  Administered 2017-09-06: 14:00:00 via INTRAVENOUS
  Filled 2017-09-06: qty 1000

## 2017-09-06 MED ORDER — DEXAMETHASONE SODIUM PHOSPHATE 10 MG/ML IJ SOLN
10.0000 mg | Freq: Once | INTRAMUSCULAR | Status: AC
  Administered 2017-09-06: 10 mg via INTRAVENOUS
  Filled 2017-09-06: qty 1

## 2017-09-06 MED ORDER — DOCETAXEL CHEMO INJECTION 160 MG/16ML: 75.0000 mg/m2 | Freq: Once | INTRAVENOUS | Status: DC

## 2017-09-06 NOTE — Progress Notes (Signed)
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 He had biochemical recurrence in 2017 and was treated with IM RT. He again began to have a rising PSA in 2010 and underwent orchiectomy in 2010.his PSA was being monitored closely PSA a year ago was 0.5, 5 months ago was 3.9 in 2 months ago was 7.8.he underwent CT abdomen and pelvis in May 2018which showed abnormal periaortic and right common iliac lymph nodes. Index aortocaval lymph node 1.4 cm which was new as compared to February 2014 and suspicious for malignancy. Bone scan did not reveal any evidence of bony metastatic disease. He has been referred to [place] for systemic therapy options for CRPC  Patient lives with his wife and is independent of his ADL's. He has A fib for which he is on coumadin. Also has chronic urinary incontinence from prior prostate surgery and has AUS. Reports 50 pound intentional weight loss since October 2017  Repeat PSA in July 2018 was 13.3 indicating PSA doubling time of 3 months  Docetaxel chemo initiated for CRPC with ln mets and rapid doubling PSA in august 2018   Interval history- he had some diarrhea for few days after receiving K and mg last time. He has baseline neuropathy prior to docetaxel that has remained unchanged  ECOG PS- 0 Pain scale- 0   Review of systems- Review of Systems  Constitutional: Positive for malaise/fatigue. Negative for chills, fever and weight loss.  HENT:  Negative for congestion, ear discharge and nosebleeds.   Eyes: Negative for blurred vision.  Respiratory: Negative for cough, hemoptysis, sputum production, shortness of breath and wheezing.   Cardiovascular: Negative for chest pain, palpitations, orthopnea and claudication.  Gastrointestinal: Negative for abdominal pain, blood in stool, constipation, diarrhea, heartburn, melena, nausea and vomiting.  Genitourinary: Negative for dysuria, flank pain, frequency, hematuria and urgency.  Musculoskeletal: Negative for back pain, joint pain and myalgias.  Skin: Negative for rash.  Neurological: Positive for tingling. Negative for dizziness, focal weakness, seizures, weakness and headaches.  Endo/Heme/Allergies: Does not bruise/bleed easily.  Psychiatric/Behavioral: Negative for depression and suicidal ideas. The patient does not have insomnia.        Allergies  Allergen Reactions  . Latex Rash    Sensitivity, not allergy.     Past Medical History:  Diagnosis Date  . Arthritis, degenerative 10/30/2015  . Atrial fibrillation ([place]) 10/30/2015  . Bladder spasm 06/12/2013  . Carcinoma of prostate ([place]) 10/30/2015  . Cardiomyopathy ([place]) 09/10/2014   Overview:  EF 35%,4/15   . Cerebrovascular accident (CVA) ([place]) 10/30/2015   Overview:  Left embolic [id number]   . Difficult or painful urination 11/21/2013  . Dysrhythmia   . Gout 10/30/2015  . History of kidney stones   . Malignant neoplasm of prostate ([place]) 09/05/2012  . Sinoatrial node dysfunction (HCC) 10/30/2015   Overview:  DDD pacemaker, 1996      Past Surgical History:  Procedure Laterality Date  . castration  10/2009  . CATARACT EXTRACTION W/ INTRAOCULAR LENS  IMPLANT, BILATERAL Bilateral  2016   right eye done then the left one done 3 months later in 2016  . INGUINAL HERNIA REPAIR Left   . INSERT / REPLACE / REMOVE PACEMAKER    . IR FLUORO GUIDE PORT INSERTION RIGHT  07/01/2017  . JOINT REPLACEMENT Bilateral 1980   hips  .  left knee replacement  2014  . PACEMAKER INSERTION    . PROSTATECTOMY    . REPLACEMENT TOTAL HIP W/  RESURFACING IMPLANTS  1997   bilateral-   . REPLACEMENT TOTAL KNEE Bilateral   . testes removal     per pt   . TONSILLECTOMY AND ADENOIDECTOMY    . TRANSURETHRAL RESECTION OF BLADDER TUMOR N/A 12/31/2015   Procedure: BLADDER BIOPSY;  Surgeon: Nickie Retort, MD;  Location: [facility] ORS;  Service: Urology;  Laterality: N/A;  . Urinary incontinence valve     AMS    Social History   Social History  . Marital status: Married    Spouse name: N/A  . Number of children: N/A  . Years of education: N/A   Occupational History  . Not on file.   Social History Main Topics  . Smoking status: Never Smoker  . Smokeless tobacco: Never Used  . Alcohol use No  . Drug use: No  . Sexual activity: No   Other Topics Concern  . Not on file   Social History Narrative  . No narrative on file    Family History  Problem Relation Age of Onset  . Cancer Mother   . Arthritis Mother   . Heart attack Father   . Arthritis Father   . Transient ischemic attack Brother   . Prostate cancer Neg Hx   . Bladder Cancer Neg Hx      Current Outpatient Prescriptions:  .  allopurinol (ZYLOPRIM) 300 MG tablet, Take by mouth daily. In am., Disp: , Rfl:  .  dexamethasone (DECADRON) 4 MG tablet, Take 2 tablets (8 mg total) by mouth 2 (two) times daily. Start the day before Taxotere. Then daily after chemo for 2 days. (Patient not taking: Reported on 08/16/2017), Disp: 30 tablet, Rfl: 1 .  Glucosamine Sulfate 1000 MG CAPS, Take 1 capsule by mouth 2 (two) times daily. , Disp: , Rfl:  .  hydrochlorothiazide (HYDRODIURIL) 25 MG tablet, Take 25 mg by mouth. In am, Disp: , Rfl:  .  lidocaine-prilocaine (EMLA) cream, Apply to affected area once, Disp: 30 g, Rfl: 3 .  LORazepam (ATIVAN) 0.5 MG tablet, Take 1 tablet (0.5 mg total) by mouth every 6 (six) hours as needed (Nausea or vomiting). (Patient not taking: Reported  on 08/16/2017), Disp: 30 tablet, Rfl: 0 .  ondansetron (ZOFRAN) 8 MG tablet, Take 1 tablet (8 mg total) by mouth 2 (two) times daily as needed for refractory nausea / vomiting. (Patient not taking: Reported on 08/16/2017), Disp: 30 tablet, Rfl: 1 .  phentermine 15 MG capsule, Take 15 mg by mouth every morning., Disp: , Rfl:  .  prochlorperazine (COMPAZINE) 10 MG tablet, Take 1 tablet (10 mg total) by mouth every 6 (six) hours as needed (Nausea or vomiting). (Patient not taking: Reported on 08/16/2017), Disp: 30 tablet, Rfl: 1 .  simvastatin (ZOCOR) 40 MG tablet, Take 40 mg by mouth daily at 6 PM. , Disp: , Rfl:  .  warfarin (COUMADIN) 2 MG tablet, Take 2 mg by mouth once a week. On saturdays., Disp: , Rfl:  .  warfarin (COUMADIN) 5 MG tablet, TAKE ONE (1) TABLET EACH DAY,  Disp: , Rfl:   Current Facility-Administered Medications:  .  lidocaine (XYLOCAINE) 2 % jelly 1 application, 1 application, Urethral, Once, Nickie Retort, MD  Physical exam:  Vitals:   09/06/17 1338  BP: (!) 153/78  Pulse: 97  Resp: 18  Temp: (!) 95.8 F (35.4 C)  TempSrc: Tympanic  Weight: 279 lb (126.6 kg)   Physical Exam  Constitutional: He is oriented to person, place, and time and well-developed, well-nourished, and in no distress.  HENT:  Head: Normocephalic and atraumatic.  Eyes: Pupils are equal, round, and reactive to light. EOM are normal.  Neck: Normal range of motion.  Cardiovascular: Normal rate, regular rhythm and normal heart sounds.   Pulmonary/Chest: Effort normal and breath sounds normal.  Abdominal: Soft. Bowel sounds are normal.  Neurological: He is alert and oriented to person, place, and time.  Skin: Skin is warm and dry.  Scattered bruising over forearms     CMP Latest Ref Rng & Units 08/23/2017  Glucose 65 - 99 mg/dL 113(H)  BUN 6 - 20 mg/dL 20  Creatinine 0.61 - 1.24 mg/dL 1.01  Sodium 135 - 145 mmol/L 131(L)  Potassium 3.5 - 5.1 mmol/L 4.0  Chloride 101 - 111 mmol/L 97(L)  CO2 22 -  32 mmol/L 25  Calcium 8.9 - 10.3 mg/dL 9.2  Total Protein 6.5 - 8.1 g/dL 6.6  Total Bilirubin 0.3 - 1.2 mg/dL 1.7(H)  Alkaline Phos 38 - 126 U/L 62  AST 15 - 41 U/L 19  ALT 17 - 63 U/L 15(L)   CBC Latest Ref Rng & Units 09/06/2017  WBC 3.8 - 10.6 K/uL 12.1(H)  Hemoglobin 13.0 - 18.0 g/dL 10.1(L)  Hematocrit 40.0 - 52.0 % 29.0(L)  Platelets 150 - 440 K/uL 147(L)     Assessment and plan- Patient is a 71 y.o. male CRPC with LN mets on docetaxel  Counts ok to proceed with cycle 4 of docetaxel today with onpro neulasta. PSA has decreased from 13 to 9. Repeat cbc, cmp psa in 3 weeks time  Patient is on coumadin for afib. States that he checks INR once in 4 mnths as he has been on it for several years with stable levels. Check with next visit.  Grade 1 peripheral neuropathy- at baseline. Continue to monitor   Visit Diagnosis 1. Malignant neoplasm of prostate ([place])   2. Regional lymph node metastasis present ([place])   3. Chronic atrial fibrillation ([place])   4. Encounter for antineoplastic chemotherapy      Dr. Randa Evens, MD, MPH [facility] Pager- [phone] 09/06/2017 1:34 PM

## 2017-09-06 NOTE — Progress Notes (Signed)
Here for follow up. Stated doing well. Did have UTI- resolved he stated w CIPRO script. Denies continued sx.

## 2017-09-07 DIAGNOSIS — I4891 Unspecified atrial fibrillation: Secondary | ICD-10-CM | POA: Diagnosis not present

## 2017-09-12 DIAGNOSIS — R791 Abnormal coagulation profile: Secondary | ICD-10-CM | POA: Diagnosis not present

## 2017-09-20 DIAGNOSIS — I480 Paroxysmal atrial fibrillation: Secondary | ICD-10-CM | POA: Diagnosis not present

## 2017-09-27 ENCOUNTER — Inpatient Hospital Stay: Payer: PPO

## 2017-09-27 ENCOUNTER — Inpatient Hospital Stay: Payer: PPO | Attending: Oncology | Admitting: Oncology

## 2017-09-27 ENCOUNTER — Encounter: Payer: Self-pay | Admitting: Oncology

## 2017-09-27 VITALS — BP 118/71 | HR 76 | Temp 97.8°F | Resp 18 | Wt 277.0 lb

## 2017-09-27 DIAGNOSIS — Z9079 Acquired absence of other genital organ(s): Secondary | ICD-10-CM | POA: Insufficient documentation

## 2017-09-27 DIAGNOSIS — Z8673 Personal history of transient ischemic attack (TIA), and cerebral infarction without residual deficits: Secondary | ICD-10-CM | POA: Diagnosis not present

## 2017-09-27 DIAGNOSIS — Z79899 Other long term (current) drug therapy: Secondary | ICD-10-CM | POA: Diagnosis not present

## 2017-09-27 DIAGNOSIS — Z950 Presence of cardiac pacemaker: Secondary | ICD-10-CM | POA: Insufficient documentation

## 2017-09-27 DIAGNOSIS — C779 Secondary and unspecified malignant neoplasm of lymph node, unspecified: Secondary | ICD-10-CM

## 2017-09-27 DIAGNOSIS — I4891 Unspecified atrial fibrillation: Secondary | ICD-10-CM

## 2017-09-27 DIAGNOSIS — Z7901 Long term (current) use of anticoagulants: Secondary | ICD-10-CM

## 2017-09-27 DIAGNOSIS — M109 Gout, unspecified: Secondary | ICD-10-CM | POA: Diagnosis not present

## 2017-09-27 DIAGNOSIS — C61 Malignant neoplasm of prostate: Secondary | ICD-10-CM

## 2017-09-27 DIAGNOSIS — I482 Chronic atrial fibrillation, unspecified: Secondary | ICD-10-CM

## 2017-09-27 DIAGNOSIS — Z5111 Encounter for antineoplastic chemotherapy: Secondary | ICD-10-CM

## 2017-09-27 DIAGNOSIS — N3289 Other specified disorders of bladder: Secondary | ICD-10-CM | POA: Insufficient documentation

## 2017-09-27 DIAGNOSIS — I429 Cardiomyopathy, unspecified: Secondary | ICD-10-CM | POA: Insufficient documentation

## 2017-09-27 DIAGNOSIS — Z87442 Personal history of urinary calculi: Secondary | ICD-10-CM | POA: Diagnosis not present

## 2017-09-27 DIAGNOSIS — Z7689 Persons encountering health services in other specified circumstances: Secondary | ICD-10-CM | POA: Insufficient documentation

## 2017-09-27 DIAGNOSIS — G620 Drug-induced polyneuropathy: Secondary | ICD-10-CM

## 2017-09-27 DIAGNOSIS — D6481 Anemia due to antineoplastic chemotherapy: Secondary | ICD-10-CM

## 2017-09-27 DIAGNOSIS — T451X5A Adverse effect of antineoplastic and immunosuppressive drugs, initial encounter: Secondary | ICD-10-CM

## 2017-09-27 LAB — COMPREHENSIVE METABOLIC PANEL
ALBUMIN: 3.8 g/dL (ref 3.5–5.0)
ALT: 13 U/L — ABNORMAL LOW (ref 17–63)
ANION GAP: 11 (ref 5–15)
AST: 27 U/L (ref 15–41)
Alkaline Phosphatase: 76 U/L (ref 38–126)
BILIRUBIN TOTAL: 1 mg/dL (ref 0.3–1.2)
BUN: 35 mg/dL — ABNORMAL HIGH (ref 6–20)
CHLORIDE: 97 mmol/L — AB (ref 101–111)
CO2: 23 mmol/L (ref 22–32)
Calcium: 9.1 mg/dL (ref 8.9–10.3)
Creatinine, Ser: 1.21 mg/dL (ref 0.61–1.24)
GFR calc Af Amer: >60 mL/min (ref >60)
GFR calc non Af Amer: 59 mL/min — ABNORMAL LOW (ref >60)
GLUCOSE: 162 mg/dL — AB (ref 65–99)
Potassium: 3.6 mmol/L (ref 3.5–5.1)
Sodium: 131 mmol/L — ABNORMAL LOW (ref 135–145)
TOTAL PROTEIN: 6.9 g/dL (ref 6.5–8.1)

## 2017-09-27 LAB — CBC WITH DIFFERENTIAL/PLATELET
BASOS ABS: 0 10*3/uL (ref 0–0.1)
BASOS PCT: 0 %
EOS ABS: 0 10*3/uL (ref 0–0.7)
Eosinophils Relative: 0 %
HEMATOCRIT: 27.5 % — AB (ref 40.0–52.0)
Hemoglobin: 9.5 g/dL — ABNORMAL LOW (ref 13.0–18.0)
Lymphocytes Relative: 6 %
Lymphs Abs: 0.6 10*3/uL — ABNORMAL LOW (ref 1.0–3.6)
MCH: 31.6 pg (ref 26.0–34.0)
MCHC: 34.7 g/dL (ref 32.0–36.0)
MCV: 91.2 fL (ref 80.0–100.0)
MONO ABS: 0.4 10*3/uL (ref 0.2–1.0)
MONOS PCT: 4 %
NEUTROS ABS: 9.6 10*3/uL — AB (ref 1.4–6.5)
Neutrophils Relative %: 90 %
PLATELETS: 170 10*3/uL (ref 150–440)
RBC: 3.01 MIL/uL — ABNORMAL LOW (ref 4.40–5.90)
RDW: 19.2 % — ABNORMAL HIGH (ref 11.5–14.5)
WBC: 10.6 10*3/uL (ref 3.8–10.6)

## 2017-09-27 LAB — PSA: PROSTATIC SPECIFIC ANTIGEN: 7.27 ng/mL — AB (ref 0.00–4.00)

## 2017-09-27 LAB — PROTIME-INR
INR: 4.73 — AB
Prothrombin Time: 44.1 seconds — ABNORMAL HIGH (ref 11.4–15.2)

## 2017-09-27 LAB — MAGNESIUM: MAGNESIUM: 1.2 mg/dL — AB (ref 1.7–2.4)

## 2017-09-27 MED ORDER — SODIUM CHLORIDE 0.9 % IV SOLN
Freq: Once | INTRAVENOUS | Status: AC
  Administered 2017-09-27: 11:00:00 via INTRAVENOUS
  Filled 2017-09-27: qty 1000

## 2017-09-27 MED ORDER — DOCETAXEL CHEMO INJECTION 160 MG/16ML
60.0000 mg/m2 | Freq: Once | INTRAVENOUS | Status: AC
  Administered 2017-09-27: 150 mg via INTRAVENOUS
  Filled 2017-09-27: qty 15

## 2017-09-27 MED ORDER — HEPARIN SOD (PORK) LOCK FLUSH 100 UNIT/ML IV SOLN: 500.0000 [IU] | Freq: Once | INTRAVENOUS | Status: DC | PRN

## 2017-09-27 MED ORDER — SODIUM CHLORIDE 0.9 % IV SOLN: 10.0000 mg | Freq: Once | INTRAVENOUS | Status: DC

## 2017-09-27 MED ORDER — DEXAMETHASONE SODIUM PHOSPHATE 10 MG/ML IJ SOLN
10.0000 mg | Freq: Once | INTRAMUSCULAR | Status: AC
  Administered 2017-09-27: 10 mg via INTRAVENOUS
  Filled 2017-09-27: qty 1

## 2017-09-27 MED ORDER — SODIUM CHLORIDE 0.9% FLUSH
10.0000 mL | INTRAVENOUS | Status: DC | PRN
Start: 2017-09-27 — End: 2017-09-27
  Administered 2017-09-27: 10 mL via INTRAVENOUS
  Filled 2017-09-27: qty 10

## 2017-09-27 MED ORDER — HEPARIN SOD (PORK) LOCK FLUSH 100 UNIT/ML IV SOLN
500.0000 [IU] | Freq: Once | INTRAVENOUS | Status: AC
  Administered 2017-09-27: 500 [IU] via INTRAVENOUS

## 2017-09-27 MED ORDER — MAGNESIUM SULFATE 4 GM/100ML IV SOLN
4.0000 g | Freq: Once | INTRAVENOUS | Status: AC
  Administered 2017-09-27: 4 g via INTRAVENOUS
  Filled 2017-09-27: qty 100

## 2017-09-27 MED ORDER — PEGFILGRASTIM 6 MG/0.6ML ~~LOC~~ PSKT
6.0000 mg | PREFILLED_SYRINGE | Freq: Once | SUBCUTANEOUS | Status: AC
  Administered 2017-09-27: 6 mg via SUBCUTANEOUS
  Filled 2017-09-27: qty 0.6

## 2017-09-27 MED ORDER — METAXALONE 800 MG PO TABS: 800.0000 mg | ORAL_TABLET | Freq: Three times a day (TID) | ORAL | 0 refills | Status: DC

## 2017-09-27 NOTE — Progress Notes (Signed)
Patient states that he has developed tearing in his eyes, which is bothersome. He hurt his back by lifting water jugs during the power outage, but thinks it is improving. He is complaining of worsening neuropathy in his hands and especially his feet, which makes walking difficult. He does not take the Flu Vaccine.

## 2017-09-27 NOTE — Progress Notes (Signed)
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 He had biochemical recurrence in 2017 and was treated with IM RT. He again began to have a rising PSA in 2010 and underwent orchiectomy in 2010.his PSA was being monitored closely PSA a year ago was 0.5, 5 months ago was 3.9 in 2 months ago was 7.8.he underwent CT abdomen and pelvis in May 2018which showed abnormal periaortic and right common iliac lymph nodes. Index aortocaval lymph node 1.4 cm which was new as compared to February 2014 and suspicious for malignancy. Bone scan did not reveal any evidence of bony metastatic disease. He has been referred to [place] for systemic therapy options for CRPC  Patient lives with his wife and is independent of his ADL's. He has A fib for which he is on coumadin. Also has chronic urinary incontinence from prior prostate surgery and has AUS. Reports 50 pound intentional weight loss since October 2017  Repeat PSA in July 2018 was 13.3 indicating PSA doubling time of 3 months  Docetaxel chemo initiated for CRPC with ln mets and rapid doubling PSA in august 2018  Interval history- he does report increasing tingling numbness in his feet which makes it difficult for him to walk at times. Symptoms are at its peak few days post cycles and then tends to decrease with time. Denies other complaints. Reports some back pain and spasms after lifting heavy weight  ECOG PS- 1 Pain scale- 0   Review  of systems- Review of Systems  Constitutional: Negative for chills, fever, malaise/fatigue and weight loss.  HENT: Negative for congestion, ear discharge and nosebleeds.   Eyes: Negative for blurred vision.  Respiratory: Negative for cough, hemoptysis, sputum production, shortness of breath and wheezing.   Cardiovascular: Negative for chest pain, palpitations, orthopnea and claudication.  Gastrointestinal: Negative for abdominal pain, blood in stool, constipation, diarrhea, heartburn, melena, nausea and vomiting.  Genitourinary: Negative for dysuria, flank pain, frequency, hematuria and urgency.  Musculoskeletal: Negative for back pain, joint pain and myalgias.  Skin: Negative for rash.  Neurological: Negative for dizziness, tingling, focal weakness, seizures, weakness and headaches.  Endo/Heme/Allergies: Does not bruise/bleed easily.  Psychiatric/Behavioral: Negative for depression and suicidal ideas. The patient does not have insomnia.      Allergies  Allergen Reactions  . Latex Rash    Sensitivity, not allergy.     Past Medical History:  Diagnosis Date  . Arthritis, degenerative 10/30/2015  . Atrial fibrillation ([place]) 10/30/2015  . Bladder spasm 06/12/2013  . Carcinoma of prostate ([place]) 10/30/2015  . Cardiomyopathy ([place]) 09/10/2014   Overview:  EF 35%,4/15   . Cerebrovascular accident (CVA) ([place]) 10/30/2015   Overview:  Left embolic [id number],[id number]   . Difficult or painful urination 11/21/2013  . Dysrhythmia   . Gout 10/30/2015  . History of kidney stones   . Malignant neoplasm of prostate ([place]) 09/05/2012  . Sinoatrial node dysfunction (HCC) 10/30/2015   Overview:  DDD pacemaker, 1996      Past Surgical History:  Procedure Laterality Date  .  castration  10/2009  . CATARACT EXTRACTION W/ INTRAOCULAR LENS  IMPLANT, BILATERAL Bilateral 2016   right eye done then the left one done 3 months later in 2016  . INGUINAL HERNIA REPAIR Left   . INSERT / REPLACE / REMOVE PACEMAKER    .  IR FLUORO GUIDE PORT INSERTION RIGHT  07/01/2017  . JOINT REPLACEMENT Bilateral 1980   hips  . left knee replacement  2014  . PACEMAKER INSERTION    . PROSTATECTOMY    . REPLACEMENT TOTAL HIP W/  RESURFACING IMPLANTS  1997   bilateral-   . REPLACEMENT TOTAL KNEE Bilateral   . testes removal     per pt   . TONSILLECTOMY AND ADENOIDECTOMY    . TRANSURETHRAL RESECTION OF BLADDER TUMOR N/A 12/31/2015   Procedure: BLADDER BIOPSY;  Surgeon: Nickie Retort, MD;  Location: [facility] ORS;  Service: Urology;  Laterality: N/A;  . Urinary incontinence valve     AMS    Social History   Social History  . Marital status: Married    Spouse name: N/A  . Number of children: N/A  . Years of education: N/A   Occupational History  . Not on file.   Social History Main Topics  . Smoking status: Never Smoker  . Smokeless tobacco: Never Used  . Alcohol use No  . Drug use: No  . Sexual activity: No   Other Topics Concern  . Not on file   Social History Narrative  . No narrative on file    Family History  Problem Relation Age of Onset  . Cancer Mother   . Arthritis Mother   . Heart attack Father   . Arthritis Father   . Transient ischemic attack Brother   . Prostate cancer Neg Hx   . Bladder Cancer Neg Hx      Current Outpatient Prescriptions:  .  acetaminophen (TYLENOL) 325 MG tablet, Take 650 mg by mouth 2 (two) times daily., Disp: , Rfl:  .  allopurinol (ZYLOPRIM) 300 MG tablet, Take by mouth daily. In am., Disp: , Rfl:  .  dexamethasone (DECADRON) 4 MG tablet, Take 2 tablets (8 mg total) by mouth 2 (two) times daily. Start the day before Taxotere. Then daily after chemo for 2 days., Disp: 30 tablet, Rfl: 1 .  Glucosamine Sulfate 1000 MG CAPS, Take 1 capsule by mouth 2 (two) times daily. , Disp: , Rfl:  .  hydrochlorothiazide (HYDRODIURIL) 25 MG tablet, Take 25 mg by mouth. In am, Disp: , Rfl:  .  lidocaine-prilocaine (EMLA) cream, Apply to affected area once, Disp: 30 g, Rfl: 3 .   simvastatin (ZOCOR) 40 MG tablet, Take 40 mg by mouth daily at 6 PM. , Disp: , Rfl:  .  warfarin (COUMADIN) 2 MG tablet, Take 2 mg by mouth once a week. On saturdays., Disp: , Rfl:  .  warfarin (COUMADIN) 5 MG tablet, TAKE ONE (1) TABLET EACH DAY, Disp: , Rfl:  .  LORazepam (ATIVAN) 0.5 MG tablet, Take 1 tablet (0.5 mg total) by mouth every 6 (six) hours as needed (Nausea or vomiting). (Patient not taking: Reported on 08/16/2017), Disp: 30 tablet, Rfl: 0 .  metaxalone (SKELAXIN) 800 MG tablet, Take 1 tablet (800 mg total) by mouth 3 (three) times daily., Disp: 21 tablet, Rfl: 0 .  ondansetron (ZOFRAN) 8 MG tablet, Take 1 tablet (8 mg total) by mouth 2 (two) times daily as needed for refractory nausea / vomiting. (Patient not taking: Reported on 08/16/2017), Disp:  30 tablet, Rfl: 1 .  phentermine 15 MG capsule, Take 15 mg by mouth every morning., Disp: , Rfl:  .  prochlorperazine (COMPAZINE) 10 MG tablet, Take 1 tablet (10 mg total) by mouth every 6 (six) hours as needed (Nausea or vomiting). (Patient not taking: Reported on 08/16/2017), Disp: 30 tablet, Rfl: 1  Current Facility-Administered Medications:  .  lidocaine (XYLOCAINE) 2 % jelly 1 application, 1 application, Urethral, Once, Pilar Jarvis Horald Pollen, MD  Facility-Administered Medications Ordered in Other Visits:  .  heparin lock flush 100 unit/mL, 500 Units, Intravenous, Once, Sindy Guadeloupe, MD .  heparin lock flush 100 unit/mL, 500 Units, Intracatheter, Once PRN, Sindy Guadeloupe, MD .  magnesium sulfate IVPB 4 g 100 mL, 4 g, Intravenous, Once, Sindy Guadeloupe, MD, Last Rate: 50 mL/hr at 09/27/17 1222, 4 g at 09/27/17 1222 .  pegfilgrastim (NEULASTA ONPRO KIT) injection 6 mg, 6 mg, Subcutaneous, Once, Sindy Guadeloupe, MD .  sodium chloride flush (NS) 0.9 % injection 10 mL, 10 mL, Intravenous, PRN, Sindy Guadeloupe, MD, 10 mL at 09/27/17 0934  Physical exam:  Vitals:   09/27/17 0957  BP: 118/71  Pulse: 76  Resp: 18  Temp: 97.8 F (36.6 C)    TempSrc: Tympanic  Weight: 277 lb (125.6 kg)   Physical Exam  Constitutional: He is oriented to person, place, and time and well-developed, well-nourished, and in no distress.  HENT:  Head: Normocephalic and atraumatic.  Eyes: Pupils are equal, round, and reactive to light. EOM are normal.  Neck: Normal range of motion.  Cardiovascular: Normal rate, regular rhythm and normal heart sounds.   Pulmonary/Chest: Effort normal and breath sounds normal.  Abdominal: Soft. Bowel sounds are normal.  Neurological: He is alert and oriented to person, place, and time.  Skin: Skin is warm and dry.     CMP Latest Ref Rng & Units 09/27/2017  Glucose 65 - 99 mg/dL 162(H)  BUN 6 - 20 mg/dL 35(H)  Creatinine 0.61 - 1.24 mg/dL 1.21  Sodium 135 - 145 mmol/L 131(L)  Potassium 3.5 - 5.1 mmol/L 3.6  Chloride 101 - 111 mmol/L 97(L)  CO2 22 - 32 mmol/L 23  Calcium 8.9 - 10.3 mg/dL 9.1  Total Protein 6.5 - 8.1 g/dL 6.9  Total Bilirubin 0.3 - 1.2 mg/dL 1.0  Alkaline Phos 38 - 126 U/L 76  AST 15 - 41 U/L 27  ALT 17 - 63 U/L 13(L)   CBC Latest Ref Rng & Units 09/27/2017  WBC 3.8 - 10.6 K/uL 10.6  Hemoglobin 13.0 - 18.0 g/dL 9.5(L)  Hematocrit 40.0 - 52.0 % 27.5(L)  Platelets 150 - 440 K/uL 170     Assessment and plan- Patient is a 71 y.o. male CRPC with LN mets on docetaxel  Counts ok to proceed with cycle 5 of docetaxel today. He is more anemic due to chemotherapy which we will continue to monitor. Docetaxel dose to be reduced to 60 mg/meter square. If neuropathy persists or worsens, I will hold off on giving cycle 6 of docetaxel  Chemo induced peripheral neuropathy- dose reduction as above. Patient does not wish to start any medications for his neuropathy at this time   Skelaxin prn for 1 week for back spasms  Patient is on coumadin and INR checked per patient request. It is upratherapeutic at 4.7. PCP to be notified  rtc in 3 weeks with cbc, cmp  Visit Diagnosis 1. Malignant neoplasm of  prostate ([place])   2. Regional lymph node  metastasis present ([place])   3. Chemotherapy-induced peripheral neuropathy ([place])   4. Encounter for antineoplastic chemotherapy      Dr. Randa Evens, MD, MPH [facility] at [facility] Pager- [phone] 09/27/2017 1:06 PM

## 2017-09-28 ENCOUNTER — Telehealth: Payer: Self-pay | Admitting: *Deleted

## 2017-09-28 NOTE — Telephone Encounter (Signed)
Coumadin is elevated and I faxed it to [employer] office and it did not go through and I just realized it and I called Dr. Janese Banks and she told me to hold it and come in tom for recheck and pt did not want to come in tom but agreeable to call mark miller office in am and I will and let pt know.

## 2017-09-29 ENCOUNTER — Telehealth: Payer: Self-pay | Admitting: *Deleted

## 2017-09-29 NOTE — Telephone Encounter (Signed)
Called md office where his coumadin in being managed and let them know that his INR 4.7 on 10/16 and I faxed the results and then the fax receipt was left on my desk 10/17 afternoon stating it failed and then I called Dr. Sabra Heck office and was on hold until the message said office closed and was sent to answering service and when I told them what I needed they told me to call hospitalist on call. I did and that was wrong place and then I called answering service again and while I was on hold I called my doctor and she said to tell pt to hold coumadin today ( 10/17)  and I did and pt will hold it.  She wanted him to come in today and check another level and he said that because he was only missing one dose there was no since in coming in and I explained that we thought it might be higher since he has dose on tues. And he was not agreeable and I told him I would call mark miller office and ask for their advice and they will send message to doctor and call me back with what to do next

## 2017-10-11 ENCOUNTER — Other Ambulatory Visit: Payer: PPO

## 2017-10-11 ENCOUNTER — Ambulatory Visit: Payer: PPO

## 2017-10-11 ENCOUNTER — Ambulatory Visit: Payer: PPO | Admitting: Oncology

## 2017-10-12 ENCOUNTER — Inpatient Hospital Stay: Payer: PPO

## 2017-10-12 ENCOUNTER — Telehealth: Payer: Self-pay | Admitting: Oncology

## 2017-10-12 ENCOUNTER — Telehealth: Payer: Self-pay | Admitting: *Deleted

## 2017-10-12 ENCOUNTER — Other Ambulatory Visit: Payer: Self-pay | Admitting: *Deleted

## 2017-10-12 ENCOUNTER — Other Ambulatory Visit: Payer: Self-pay | Admitting: Internal Medicine

## 2017-10-12 VITALS — BP 119/77 | HR 93 | Temp 97.0°F | Resp 18

## 2017-10-12 DIAGNOSIS — Z5189 Encounter for other specified aftercare: Secondary | ICD-10-CM

## 2017-10-12 DIAGNOSIS — E860 Dehydration: Secondary | ICD-10-CM | POA: Insufficient documentation

## 2017-10-12 DIAGNOSIS — R197 Diarrhea, unspecified: Secondary | ICD-10-CM

## 2017-10-12 DIAGNOSIS — C61 Malignant neoplasm of prostate: Secondary | ICD-10-CM

## 2017-10-12 DIAGNOSIS — Z5111 Encounter for antineoplastic chemotherapy: Secondary | ICD-10-CM | POA: Diagnosis not present

## 2017-10-12 LAB — COMPREHENSIVE METABOLIC PANEL
ALK PHOS: 115 U/L (ref 38–126)
ALT: 12 U/L — AB (ref 17–63)
ANION GAP: 10 (ref 5–15)
AST: 21 U/L (ref 15–41)
Albumin: 3.1 g/dL — ABNORMAL LOW (ref 3.5–5.0)
BUN: 25 mg/dL — ABNORMAL HIGH (ref 6–20)
CALCIUM: 8.8 mg/dL — AB (ref 8.9–10.3)
CO2: 23 mmol/L (ref 22–32)
CREATININE: 0.94 mg/dL (ref 0.61–1.24)
Chloride: 103 mmol/L (ref 101–111)
GFR calc non Af Amer: >60 mL/min (ref >60)
Glucose, Bld: 128 mg/dL — ABNORMAL HIGH (ref 65–99)
Potassium: 3 mmol/L — ABNORMAL LOW (ref 3.5–5.1)
Sodium: 136 mmol/L (ref 135–145)
TOTAL PROTEIN: 5.9 g/dL — AB (ref 6.5–8.1)
Total Bilirubin: 0.9 mg/dL (ref 0.3–1.2)

## 2017-10-12 LAB — CBC WITH DIFFERENTIAL/PLATELET
BASOS ABS: 0 10*3/uL (ref 0–0.1)
Basophils Relative: 0 %
EOS ABS: 0 10*3/uL (ref 0–0.7)
Eosinophils Relative: 0 %
HEMATOCRIT: 25.6 % — AB (ref 40.0–52.0)
Hemoglobin: 8.5 g/dL — ABNORMAL LOW (ref 13.0–18.0)
Lymphocytes Relative: 5 %
Lymphs Abs: 0.7 10*3/uL — ABNORMAL LOW (ref 1.0–3.6)
MCH: 30.7 pg (ref 26.0–34.0)
MCHC: 33.2 g/dL (ref 32.0–36.0)
MCV: 92.3 fL (ref 80.0–100.0)
MONO ABS: 0.5 10*3/uL (ref 0.2–1.0)
Monocytes Relative: 4 %
NEUTROS ABS: 12.2 10*3/uL — AB (ref 1.4–6.5)
NEUTROS PCT: 91 %
Platelets: 136 10*3/uL — ABNORMAL LOW (ref 150–440)
RBC: 2.77 MIL/uL — ABNORMAL LOW (ref 4.40–5.90)
RDW: 19.1 % — AB (ref 11.5–14.5)
WBC: 13.4 10*3/uL — ABNORMAL HIGH (ref 3.8–10.6)

## 2017-10-12 LAB — MAGNESIUM: MAGNESIUM: 1.2 mg/dL — AB (ref 1.7–2.4)

## 2017-10-12 LAB — PROTIME-INR
INR: 5.34
Prothrombin Time: 48.5 seconds — ABNORMAL HIGH (ref 11.4–15.2)

## 2017-10-12 LAB — SAMPLE TO BLOOD BANK

## 2017-10-12 MED ORDER — HEPARIN SOD (PORK) LOCK FLUSH 100 UNIT/ML IV SOLN
500.0000 [IU] | Freq: Once | INTRAVENOUS | Status: AC
  Administered 2017-10-12: 500 [IU] via INTRAVENOUS

## 2017-10-12 MED ORDER — SODIUM CHLORIDE 0.9 % IV SOLN
Freq: Once | INTRAVENOUS | Status: DC
  Filled 2017-10-12: qty 1000

## 2017-10-12 MED ORDER — SODIUM CHLORIDE 0.9 % IV SOLN
INTRAVENOUS | Status: DC
  Administered 2017-10-12: 14:00:00 via INTRAVENOUS
  Filled 2017-10-12 (×2): qty 1000

## 2017-10-12 MED ORDER — SODIUM CHLORIDE 0.9 % IV SOLN
Freq: Once | INTRAVENOUS | Status: AC
  Administered 2017-10-12: 15:00:00 via INTRAVENOUS
  Filled 2017-10-12: qty 15

## 2017-10-12 NOTE — Telephone Encounter (Signed)
1448 - Received Critical value call from [facility] [place] in cancer center lab. Read back process performed with CMA.   Dr. Rogue Bussing informed  1500. V/o Dr. Rogue Bussing  md recommend holding coumadin for today; await for further recommendations from pcp-Dr. Emily Filbert who manages pt's INR levels. I personally informed pt to hold his coumadin.   Left msg for Claiborne Billings at Dr. Elta Guadeloupe Miller's office. She returned my ph call at [phone]. She will inform Dr. Sabra Heck of pt's lab values and pt will be contacted by pcp with new instructions.

## 2017-10-12 NOTE — Telephone Encounter (Signed)
Patient agrees to be here at 1 PM for lab and IVF

## 2017-10-12 NOTE — Telephone Encounter (Signed)
Lab/1 hr IVF for today, per schd msg/Brenda. Auth by Olegario Shearer. MF

## 2017-10-12 NOTE — Telephone Encounter (Signed)
Per VO Dr Rogue Bussing, have patient come in  For cbc, CMP, Mg+ Extra tube, and IV fluids today may need IV blood transfusion, mag or potassium .   Patient in agreement to come in to office

## 2017-10-12 NOTE — Telephone Encounter (Signed)
Had chemotherapy and does not want to have any others due to side effects from 5th infusion. Feet neuropathy, diarrhea, weakness (fell yesterday and had to get help to get up), shortness of breath at rest. HGB 10/16 was 9.5 and has been on a steady decline for several checks. Please advise

## 2017-10-13 ENCOUNTER — Telehealth: Payer: Self-pay | Admitting: *Deleted

## 2017-10-13 NOTE — Telephone Encounter (Signed)
-----   Message from Sindy Guadeloupe, MD sent at 10/13/2017  7:59 AM EDT ----- Is Dr. Sabra Heck aware of this? Astrid Divine

## 2017-10-13 NOTE — Telephone Encounter (Signed)
I will fax results and call Dr. Sabra Heck and call to make sure they get results.    dhs

## 2017-10-13 NOTE — Telephone Encounter (Signed)
I spoke to Jeremy Carney this am and she said that she did hear back from [facility] office and the CMA does know about the level and she will contact the pt and Dr. Sabra Heck gave her the directions and labs that need to be done

## 2017-10-17 ENCOUNTER — Other Ambulatory Visit: Payer: Self-pay | Admitting: *Deleted

## 2017-10-17 ENCOUNTER — Telehealth: Payer: Self-pay | Admitting: *Deleted

## 2017-10-17 DIAGNOSIS — C61 Malignant neoplasm of prostate: Secondary | ICD-10-CM

## 2017-10-17 NOTE — Telephone Encounter (Signed)
Patient coming in tomorrow for labs/treatmen; no PT/INR required per Dr. Janese Banks    dhs

## 2017-10-18 ENCOUNTER — Inpatient Hospital Stay: Payer: PPO

## 2017-10-18 ENCOUNTER — Encounter: Payer: Self-pay | Admitting: Oncology

## 2017-10-18 ENCOUNTER — Inpatient Hospital Stay: Payer: PPO | Attending: Oncology

## 2017-10-18 ENCOUNTER — Inpatient Hospital Stay (HOSPITAL_BASED_OUTPATIENT_CLINIC_OR_DEPARTMENT_OTHER): Payer: PPO | Admitting: Oncology

## 2017-10-18 VITALS — BP 121/75 | HR 83 | Temp 97.5°F | Resp 18 | Wt 278.0 lb

## 2017-10-18 DIAGNOSIS — Z79899 Other long term (current) drug therapy: Secondary | ICD-10-CM | POA: Insufficient documentation

## 2017-10-18 DIAGNOSIS — Z87442 Personal history of urinary calculi: Secondary | ICD-10-CM | POA: Insufficient documentation

## 2017-10-18 DIAGNOSIS — G620 Drug-induced polyneuropathy: Secondary | ICD-10-CM

## 2017-10-18 DIAGNOSIS — T451X5S Adverse effect of antineoplastic and immunosuppressive drugs, sequela: Secondary | ICD-10-CM | POA: Insufficient documentation

## 2017-10-18 DIAGNOSIS — Z950 Presence of cardiac pacemaker: Secondary | ICD-10-CM | POA: Insufficient documentation

## 2017-10-18 DIAGNOSIS — C61 Malignant neoplasm of prostate: Secondary | ICD-10-CM

## 2017-10-18 DIAGNOSIS — Z7901 Long term (current) use of anticoagulants: Secondary | ICD-10-CM | POA: Diagnosis not present

## 2017-10-18 DIAGNOSIS — I4891 Unspecified atrial fibrillation: Secondary | ICD-10-CM | POA: Diagnosis not present

## 2017-10-18 DIAGNOSIS — Z8673 Personal history of transient ischemic attack (TIA), and cerebral infarction without residual deficits: Secondary | ICD-10-CM | POA: Insufficient documentation

## 2017-10-18 DIAGNOSIS — R600 Localized edema: Secondary | ICD-10-CM | POA: Insufficient documentation

## 2017-10-18 DIAGNOSIS — D6481 Anemia due to antineoplastic chemotherapy: Secondary | ICD-10-CM

## 2017-10-18 DIAGNOSIS — M109 Gout, unspecified: Secondary | ICD-10-CM | POA: Diagnosis not present

## 2017-10-18 DIAGNOSIS — I429 Cardiomyopathy, unspecified: Secondary | ICD-10-CM | POA: Diagnosis not present

## 2017-10-18 DIAGNOSIS — Z9079 Acquired absence of other genital organ(s): Secondary | ICD-10-CM | POA: Insufficient documentation

## 2017-10-18 DIAGNOSIS — N3289 Other specified disorders of bladder: Secondary | ICD-10-CM | POA: Insufficient documentation

## 2017-10-18 DIAGNOSIS — T451X5A Adverse effect of antineoplastic and immunosuppressive drugs, initial encounter: Secondary | ICD-10-CM

## 2017-10-18 LAB — COMPREHENSIVE METABOLIC PANEL
ALBUMIN: 3.2 g/dL — AB (ref 3.5–5.0)
ALK PHOS: 111 U/L (ref 38–126)
ALT: 12 U/L — ABNORMAL LOW (ref 17–63)
AST: 20 U/L (ref 15–41)
Anion gap: 10 (ref 5–15)
BILIRUBIN TOTAL: 1 mg/dL (ref 0.3–1.2)
BUN: 25 mg/dL — AB (ref 6–20)
CO2: 23 mmol/L (ref 22–32)
Calcium: 9.1 mg/dL (ref 8.9–10.3)
Chloride: 99 mmol/L — ABNORMAL LOW (ref 101–111)
Creatinine, Ser: 1.02 mg/dL (ref 0.61–1.24)
GFR calc Af Amer: >60 mL/min (ref >60)
GFR calc non Af Amer: >60 mL/min (ref >60)
GLUCOSE: 151 mg/dL — AB (ref 65–99)
POTASSIUM: 3.8 mmol/L (ref 3.5–5.1)
SODIUM: 132 mmol/L — AB (ref 135–145)
TOTAL PROTEIN: 6.1 g/dL — AB (ref 6.5–8.1)

## 2017-10-18 LAB — CBC WITH DIFFERENTIAL/PLATELET
BASOS ABS: 0 10*3/uL (ref 0–0.1)
Basophils Relative: 0 %
EOS PCT: 0 %
Eosinophils Absolute: 0 10*3/uL (ref 0–0.7)
HEMATOCRIT: 27 % — AB (ref 40.0–52.0)
Hemoglobin: 8.9 g/dL — ABNORMAL LOW (ref 13.0–18.0)
LYMPHS PCT: 4 %
Lymphs Abs: 0.5 10*3/uL — ABNORMAL LOW (ref 1.0–3.6)
MCH: 30.7 pg (ref 26.0–34.0)
MCHC: 32.9 g/dL (ref 32.0–36.0)
MCV: 93.1 fL (ref 80.0–100.0)
MONO ABS: 0.3 10*3/uL (ref 0.2–1.0)
MONOS PCT: 2 %
Neutro Abs: 12.2 10*3/uL — ABNORMAL HIGH (ref 1.4–6.5)
Neutrophils Relative %: 94 %
PLATELETS: 196 10*3/uL (ref 150–440)
RBC: 2.9 MIL/uL — ABNORMAL LOW (ref 4.40–5.90)
RDW: 19.4 % — AB (ref 11.5–14.5)
WBC: 13 10*3/uL — ABNORMAL HIGH (ref 3.8–10.6)

## 2017-10-18 LAB — IRON AND TIBC
Iron: 34 ug/dL — ABNORMAL LOW (ref 45–182)
SATURATION RATIOS: 13 % — AB (ref 17.9–39.5)
TIBC: 268 ug/dL (ref 250–450)
UIBC: 234 ug/dL

## 2017-10-18 LAB — MAGNESIUM: Magnesium: 1.3 mg/dL — ABNORMAL LOW (ref 1.7–2.4)

## 2017-10-18 LAB — SAMPLE TO BLOOD BANK

## 2017-10-18 LAB — FOLATE: FOLATE: 6.6 ng/mL (ref >5.9)

## 2017-10-18 LAB — FERRITIN: Ferritin: 461 ng/mL — ABNORMAL HIGH (ref 24–336)

## 2017-10-18 MED ORDER — SODIUM CHLORIDE 0.9 % IV SOLN
INTRAVENOUS | Status: DC
  Administered 2017-10-18: 12:00:00 via INTRAVENOUS
  Filled 2017-10-18: qty 1000

## 2017-10-18 MED ORDER — SODIUM CHLORIDE 0.9% FLUSH
10.0000 mL | INTRAVENOUS | Status: DC | PRN
  Filled 2017-10-18: qty 10

## 2017-10-18 MED ORDER — MAGNESIUM SULFATE 4 GM/100ML IV SOLN
4.0000 g | Freq: Once | INTRAVENOUS | Status: AC
  Administered 2017-10-18: 4 g via INTRAVENOUS
  Filled 2017-10-18: qty 100

## 2017-10-18 MED ORDER — DULOXETINE HCL 30 MG PO CPEP: 30.0000 mg | ORAL_CAPSULE | Freq: Every day | ORAL | 3 refills | Status: DC

## 2017-10-18 MED ORDER — HEPARIN SOD (PORK) LOCK FLUSH 100 UNIT/ML IV SOLN
500.0000 [IU] | Freq: Once | INTRAVENOUS | Status: AC
  Administered 2017-10-18: 500 [IU] via INTRAVENOUS

## 2017-10-18 NOTE — Progress Notes (Signed)
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 He had biochemical recurrence in 2017 and was treated with IM RT. He again began to have a rising PSA in 2010 and underwent orchiectomy in 2010.his PSA was being monitored closely PSA a year ago was 0.5, 5 months ago was 3.9 in 2 months ago was 7.8.he underwent CT abdomen and pelvis in May 2018which showed abnormal periaortic and right common iliac lymph nodes. Index aortocaval lymph node 1.4 cm which was new as compared to February 2014 and suspicious for malignancy. Bone scan did not reveal any evidence of bony metastatic disease. He has been referred to [place] for systemic therapy options for CRPC  Patient lives with his wife and is independent of his ADL's. He has A fib for which he is on coumadin. Also has chronic urinary incontinence from prior prostate surgery and has AUS. Reports 50 pound intentional weight loss since October 2017  Repeat PSA in July 2018 was 13.3 indicating PSA doubling time of 3 months  Docetaxel chemo initiated for CRPC with ln mets and rapid doubling PSA in august 2018  Interval history- patient feels quite fatigued. He was trying to bend over to pick up something from the floor when he lost his balance and had a hall last week which was minor and he sustained minor injuries. Reports that his neuropathy mainly present in his fingers and toes and gradually getting worse.   ECOG PS- 2 Pain scale-  5 Opioid associated constipation- no  Review of systems- Review of Systems  Constitutional: Positive for malaise/fatigue. Negative for chills, fever and weight loss.  HENT: Negative for congestion, ear discharge and nosebleeds.   Eyes: Negative for blurred vision.  Respiratory: Negative for cough, hemoptysis, sputum production, shortness of breath and wheezing.   Cardiovascular: Positive for leg swelling. Negative for chest pain, palpitations, orthopnea and claudication.  Gastrointestinal: Negative for abdominal pain, blood in stool, constipation, diarrhea, heartburn, melena, nausea and vomiting.  Genitourinary: Negative for dysuria, flank pain, frequency, hematuria and urgency.  Musculoskeletal: Negative for back pain, joint pain and myalgias.  Skin: Negative for rash.  Neurological: Positive for tingling. Negative for dizziness, focal weakness, seizures, weakness and headaches.  Endo/Heme/Allergies: Does not bruise/bleed easily.  Psychiatric/Behavioral: Negative for depression and suicidal ideas. The patient does not have insomnia.        Allergies  Allergen Reactions  . Latex Rash    Sensitivity, not allergy.     Past Medical History:  Diagnosis Date  . Arthritis, degenerative 10/30/2015  . Atrial fibrillation ([place]) 10/30/2015  . Bladder spasm 06/12/2013  . Carcinoma of prostate ([place]) 10/30/2015  . Cardiomyopathy ([place]) 09/10/2014   Overview:  EF 35%,4/15   . Cerebrovascular accident (CVA) ([place]) 10/30/2015   Overview:  Left embolic LOV,5643   . Difficult or painful urination 11/21/2013  . Dysrhythmia   . Gout 10/30/2015  . History of kidney stones   . Malignant neoplasm of prostate ([place]) 09/05/2012  . Sinoatrial node dysfunction ([facility]) 10/30/2015   Overview:  DDD pacemaker, 1996      Past Surgical History:  Procedure Laterality Date  . castration  10/2009  . CATARACT EXTRACTION W/ INTRAOCULAR LENS  IMPLANT, BILATERAL Bilateral 2016   right eye done then the left one  done 3 months later in 2016  . INGUINAL HERNIA REPAIR Left   . INSERT / REPLACE / REMOVE PACEMAKER    . IR FLUORO GUIDE PORT INSERTION RIGHT  07/01/2017  . JOINT REPLACEMENT Bilateral 1980   hips  . left knee replacement  2014  . PACEMAKER INSERTION    . PROSTATECTOMY    . REPLACEMENT TOTAL HIP W/  RESURFACING IMPLANTS  1997   bilateral-   . REPLACEMENT TOTAL KNEE Bilateral   . testes removal     per pt   . TONSILLECTOMY AND ADENOIDECTOMY    . Urinary incontinence valve     AMS    Social History   Socioeconomic History  . Marital status: Married    Spouse name: Not on file  . Number of children: Not on file  . Years of education: Not on file  . Highest education level: Not on file  Social Needs  . Financial resource strain: Not on file  . Food insecurity - worry: Not on file  . Food insecurity - inability: Not on file  . Transportation needs - medical: Not on file  . Transportation needs - non-medical: Not on file  Occupational History  . Not on file  Tobacco Use  . Smoking status: Never Smoker  . Smokeless tobacco: Never Used  Substance and Sexual Activity  . Alcohol use: No    Alcohol/week: 0.0 oz  . Drug use: No  . Sexual activity: No  Other Topics Concern  . Not on file  Social History Narrative  . Not on file    Family History  Problem Relation Age of Onset  . Cancer Mother   . Arthritis Mother   . Heart attack Father   . Arthritis Father   . Transient ischemic attack Brother   . Prostate cancer Neg Hx   . Bladder Cancer Neg Hx      Current Outpatient Medications:  .  acetaminophen (TYLENOL) 325 MG tablet, Take 650 mg by mouth 2 (two) times daily., Disp: , Rfl:  .  allopurinol (ZYLOPRIM) 300 MG tablet, Take by mouth daily. In am., Disp: , Rfl:  .  dexamethasone (DECADRON) 4 MG tablet, Take 2 tablets (8 mg total) by mouth 2 (two) times daily. Start the day before Taxotere. Then daily after chemo for 2 days., Disp: 30 tablet, Rfl: 1 .  Glucosamine  Sulfate 1000 MG CAPS, Take 1 capsule by mouth 2 (two) times daily. , Disp: , Rfl:  .  hydrochlorothiazide (HYDRODIURIL) 25 MG tablet, Take 25 mg by mouth. In am, Disp: , Rfl:  .  lidocaine-prilocaine (EMLA) cream, Apply to affected area once, Disp: 30 g, Rfl: 3 .  LORazepam (ATIVAN) 0.5 MG tablet, Take 1 tablet (0.5 mg total) by mouth every 6 (six) hours as needed (Nausea or vomiting)., Disp: 30 tablet, Rfl: 0 .  metaxalone (SKELAXIN) 800 MG tablet, Take 1 tablet (800 mg total) by mouth 3 (three) times daily., Disp: 21 tablet, Rfl: 0 .  ondansetron (ZOFRAN) 8 MG tablet, Take 1 tablet (8 mg total) by mouth 2 (two) times daily as needed for refractory nausea / vomiting., Disp: 30 tablet, Rfl: 1 .  phentermine 15 MG capsule, Take 15 mg by mouth every morning., Disp: ,  Rfl:  .  prochlorperazine (COMPAZINE) 10 MG tablet, Take 1 tablet (10 mg total) by mouth every 6 (six) hours as needed (Nausea or vomiting)., Disp: 30 tablet, Rfl: 1 .  simvastatin (ZOCOR) 40 MG tablet, Take 40 mg by mouth daily at 6 PM. , Disp: , Rfl:  .  warfarin (COUMADIN) 2 MG tablet, Take 2 mg by mouth once a week. On saturdays., Disp: , Rfl:  .  warfarin (COUMADIN) 5 MG tablet, TAKE ONE (1) TABLET EACH DAY, Disp: , Rfl:  .  DULoxetine (CYMBALTA) 30 MG capsule, Take 1 capsule (30 mg total) daily by mouth., Disp: 60 capsule, Rfl: 3  Current Facility-Administered Medications:  .  lidocaine (XYLOCAINE) 2 % jelly 1 application, 1 application, Urethral, Once, Pilar Jarvis Horald Pollen, MD  Facility-Administered Medications Ordered in Other Visits:  .  0.9 %  sodium chloride infusion, , Intravenous, Continuous, Randa Evens C, MD .  magnesium sulfate IVPB 4 g 100 mL, 4 g, Intravenous, Once, Sindy Guadeloupe, MD  Physical exam:  Vitals:   10/18/17 1054  BP: 121/75  Pulse: 83  Resp: 18  Temp: (!) 97.5 F (36.4 C)  TempSrc: Tympanic  Weight: 278 lb (126.1 kg)   Physical Exam  Constitutional: He is oriented to person, place, and time.   He is sitting in a wheelchair. Appears in no acute distress  HENT:  Head: Normocephalic and atraumatic.  Eyes: EOM are normal. Pupils are equal, round, and reactive to light.  Neck: Normal range of motion.  Cardiovascular: Normal rate, regular rhythm and normal heart sounds.  Pulmonary/Chest: Effort normal and breath sounds normal.  Abdominal: Soft. Bowel sounds are normal.  Musculoskeletal: He exhibits edema.  Neurological: He is alert and oriented to person, place, and time.  Skin: Skin is warm and dry.  Superficial scabbing noted over scalp and right knee     CMP Latest Ref Rng & Units 10/18/2017  Glucose 65 - 99 mg/dL 151(H)  BUN 6 - 20 mg/dL 25(H)  Creatinine 0.61 - 1.24 mg/dL 1.02  Sodium 135 - 145 mmol/L 132(L)  Potassium 3.5 - 5.1 mmol/L 3.8  Chloride 101 - 111 mmol/L 99(L)  CO2 22 - 32 mmol/L 23  Calcium 8.9 - 10.3 mg/dL 9.1  Total Protein 6.5 - 8.1 g/dL 6.1(L)  Total Bilirubin 0.3 - 1.2 mg/dL 1.0  Alkaline Phos 38 - 126 U/L 111  AST 15 - 41 U/L 20  ALT 17 - 63 U/L 12(L)   CBC Latest Ref Rng & Units 10/18/2017  WBC 3.8 - 10.6 K/uL 13.0(H)  Hemoglobin 13.0 - 18.0 g/dL 8.9(L)  Hematocrit 40.0 - 52.0 % 27.0(L)  Platelets 150 - 440 K/uL 196      Assessment and plan- Patient is a 71 y.o. male with castrate resistant prostate cancer s/p 5 cycles of docetaxel  Patient has worsening anemia and neuropathy due to chemotherapy. I will therefore plan to stop his docetaxel at this time as he has completed 5 cycles. Plan to repeat cbc, cmp, psa and testosterone levels in 2 weeks. CT chest abdomen and plevis with contrast following that and I will discuss his case at the tumor board and see him thereafter. Patients PSA decreased with chemo but has plateau at 7 last month which is concerning  Chemo induced anemia- hb down to 8.9 from baseline of 12 prior to treatment. Check iron studies b12 and folate with next visit. No need for transfusion today. Continue to  monitoe  Hypomagnesemia- will give 4 gm  of magnesium today. Repeat levels in 2 weeks  Chemo induced peripheral neuropathy- start cymbalta 30 mg daily and increase to 60 mg after 1 week  Leg edema- symmetrical likely due to docetaxel and hypoalbuminemia   Visit Diagnosis 1. Carcinoma of prostate ([place])   2. Antineoplastic chemotherapy induced anemia   3. Chemotherapy-induced neuropathy ([place])   4. Hypomagnesemia      Dr. Randa Evens, MD, MPH [facility] at [facility] Pager- [phone] 10/18/2017 11:47 AM

## 2017-10-18 NOTE — Progress Notes (Signed)
Patient here today for follow up with labs and treatment. Today should be the last chemotherapy treatment. He is still having back pain from an injury after lifting something heavy, but it is getting better every day. He reports having significant side effects from chemotherapy, especially neuropathy. His eyes are also tearing constantly.

## 2017-10-18 NOTE — Addendum Note (Signed)
Addended by: Luella Cook on: 10/18/2017 02:07 PM   Modules accepted: Orders

## 2017-10-19 LAB — VITAMIN B12: Vitamin B-12: 2960 pg/mL — ABNORMAL HIGH (ref 180–914)

## 2017-10-21 DIAGNOSIS — R972 Elevated prostate specific antigen [PSA]: Secondary | ICD-10-CM | POA: Diagnosis not present

## 2017-10-21 DIAGNOSIS — I420 Dilated cardiomyopathy: Secondary | ICD-10-CM | POA: Diagnosis not present

## 2017-10-21 DIAGNOSIS — I482 Chronic atrial fibrillation: Secondary | ICD-10-CM | POA: Diagnosis not present

## 2017-10-25 ENCOUNTER — Telehealth: Payer: Self-pay | Admitting: *Deleted

## 2017-10-25 NOTE — Telephone Encounter (Signed)
Spoke to patient's wife via telephone and gave her appointment details and instructions for Whole Body Bone Scan and Ct Scan of Chest, Abdomen and Pelvis for next Wednesday, November 21st.   dhs

## 2017-10-27 DIAGNOSIS — I4891 Unspecified atrial fibrillation: Secondary | ICD-10-CM | POA: Diagnosis not present

## 2017-10-28 DIAGNOSIS — I482 Chronic atrial fibrillation: Secondary | ICD-10-CM | POA: Diagnosis not present

## 2017-10-28 DIAGNOSIS — G620 Drug-induced polyneuropathy: Secondary | ICD-10-CM | POA: Diagnosis not present

## 2017-10-28 DIAGNOSIS — C61 Malignant neoplasm of prostate: Secondary | ICD-10-CM | POA: Diagnosis not present

## 2017-10-28 DIAGNOSIS — E782 Mixed hyperlipidemia: Secondary | ICD-10-CM | POA: Diagnosis not present

## 2017-10-28 DIAGNOSIS — T451X5A Adverse effect of antineoplastic and immunosuppressive drugs, initial encounter: Secondary | ICD-10-CM | POA: Diagnosis not present

## 2017-10-28 DIAGNOSIS — Z0000 Encounter for general adult medical examination without abnormal findings: Secondary | ICD-10-CM | POA: Diagnosis not present

## 2017-11-01 ENCOUNTER — Inpatient Hospital Stay: Payer: PPO

## 2017-11-02 ENCOUNTER — Ambulatory Visit
Admission: RE | Admit: 2017-11-02 | Discharge: 2017-11-02 | Disposition: A | Discharge status: Home / Self Care | Payer: PPO | Source: Ambulatory Visit | Attending: Oncology | Admitting: Oncology

## 2017-11-02 ENCOUNTER — Encounter
Admission: RE | Admit: 2017-11-02 | Discharge: 2017-11-02 | Disposition: A | Discharge status: Home / Self Care | Payer: PPO | Source: Ambulatory Visit | Attending: Oncology | Admitting: Oncology

## 2017-11-02 DIAGNOSIS — S2242XA Multiple fractures of ribs, left side, initial encounter for closed fracture: Secondary | ICD-10-CM | POA: Insufficient documentation

## 2017-11-02 DIAGNOSIS — R591 Generalized enlarged lymph nodes: Secondary | ICD-10-CM | POA: Diagnosis not present

## 2017-11-02 DIAGNOSIS — X58XXXA Exposure to other specified factors, initial encounter: Secondary | ICD-10-CM | POA: Diagnosis not present

## 2017-11-02 DIAGNOSIS — S22080A Wedge compression fracture of T11-T12 vertebra, initial encounter for closed fracture: Secondary | ICD-10-CM | POA: Insufficient documentation

## 2017-11-02 DIAGNOSIS — Z96643 Presence of artificial hip joint, bilateral: Secondary | ICD-10-CM | POA: Diagnosis not present

## 2017-11-02 DIAGNOSIS — C61 Malignant neoplasm of prostate: Secondary | ICD-10-CM | POA: Insufficient documentation

## 2017-11-02 DIAGNOSIS — I700 Atherosclerosis of aorta: Secondary | ICD-10-CM | POA: Diagnosis not present

## 2017-11-02 DIAGNOSIS — R911 Solitary pulmonary nodule: Secondary | ICD-10-CM | POA: Insufficient documentation

## 2017-11-02 DIAGNOSIS — N281 Cyst of kidney, acquired: Secondary | ICD-10-CM | POA: Diagnosis not present

## 2017-11-02 MED ORDER — IOPAMIDOL (ISOVUE-300) INJECTION 61%
100.0000 mL | Freq: Once | INTRAVENOUS | Status: AC | PRN
  Administered 2017-11-02: 100 mL via INTRAVENOUS

## 2017-11-02 MED ORDER — TECHNETIUM TC 99M MEDRONATE IV KIT
25.0000 | PACK | Freq: Once | INTRAVENOUS | Status: AC | PRN
  Administered 2017-11-02: 22.22 via INTRAVENOUS

## 2017-11-07 ENCOUNTER — Other Ambulatory Visit: Payer: Self-pay | Admitting: *Deleted

## 2017-11-07 DIAGNOSIS — C61 Malignant neoplasm of prostate: Secondary | ICD-10-CM

## 2017-11-14 ENCOUNTER — Other Ambulatory Visit: Payer: PPO

## 2017-11-15 ENCOUNTER — Inpatient Hospital Stay: Payer: PPO

## 2017-11-15 ENCOUNTER — Inpatient Hospital Stay: Payer: PPO | Attending: Oncology | Admitting: Oncology

## 2017-11-15 ENCOUNTER — Encounter: Payer: Self-pay | Admitting: Oncology

## 2017-11-15 VITALS — BP 137/69 | HR 77 | Temp 87.8°F | Resp 18 | Wt 271.0 lb

## 2017-11-15 DIAGNOSIS — Z87442 Personal history of urinary calculi: Secondary | ICD-10-CM | POA: Insufficient documentation

## 2017-11-15 DIAGNOSIS — Z9079 Acquired absence of other genital organ(s): Secondary | ICD-10-CM | POA: Diagnosis not present

## 2017-11-15 DIAGNOSIS — C779 Secondary and unspecified malignant neoplasm of lymph node, unspecified: Secondary | ICD-10-CM

## 2017-11-15 DIAGNOSIS — I4891 Unspecified atrial fibrillation: Secondary | ICD-10-CM | POA: Insufficient documentation

## 2017-11-15 DIAGNOSIS — M109 Gout, unspecified: Secondary | ICD-10-CM | POA: Diagnosis not present

## 2017-11-15 DIAGNOSIS — Z950 Presence of cardiac pacemaker: Secondary | ICD-10-CM | POA: Diagnosis not present

## 2017-11-15 DIAGNOSIS — Z8673 Personal history of transient ischemic attack (TIA), and cerebral infarction without residual deficits: Secondary | ICD-10-CM | POA: Insufficient documentation

## 2017-11-15 DIAGNOSIS — C61 Malignant neoplasm of prostate: Secondary | ICD-10-CM | POA: Diagnosis not present

## 2017-11-15 DIAGNOSIS — T451X5A Adverse effect of antineoplastic and immunosuppressive drugs, initial encounter: Secondary | ICD-10-CM

## 2017-11-15 DIAGNOSIS — Z96643 Presence of artificial hip joint, bilateral: Secondary | ICD-10-CM | POA: Insufficient documentation

## 2017-11-15 DIAGNOSIS — D6481 Anemia due to antineoplastic chemotherapy: Secondary | ICD-10-CM

## 2017-11-15 DIAGNOSIS — Z79899 Other long term (current) drug therapy: Secondary | ICD-10-CM | POA: Insufficient documentation

## 2017-11-15 DIAGNOSIS — I700 Atherosclerosis of aorta: Secondary | ICD-10-CM | POA: Insufficient documentation

## 2017-11-15 DIAGNOSIS — G629 Polyneuropathy, unspecified: Secondary | ICD-10-CM | POA: Diagnosis not present

## 2017-11-15 DIAGNOSIS — Z7901 Long term (current) use of anticoagulants: Secondary | ICD-10-CM | POA: Insufficient documentation

## 2017-11-15 DIAGNOSIS — I429 Cardiomyopathy, unspecified: Secondary | ICD-10-CM | POA: Diagnosis not present

## 2017-11-15 DIAGNOSIS — G620 Drug-induced polyneuropathy: Secondary | ICD-10-CM

## 2017-11-15 DIAGNOSIS — M4854XA Collapsed vertebra, not elsewhere classified, thoracic region, initial encounter for fracture: Secondary | ICD-10-CM | POA: Diagnosis not present

## 2017-11-15 LAB — COMPREHENSIVE METABOLIC PANEL
ALT: 8 U/L — AB (ref 17–63)
AST: 22 U/L (ref 15–41)
Albumin: 3.8 g/dL (ref 3.5–5.0)
Alkaline Phosphatase: 81 U/L (ref 38–126)
Anion gap: 10 (ref 5–15)
BILIRUBIN TOTAL: 0.9 mg/dL (ref 0.3–1.2)
BUN: 22 mg/dL — AB (ref 6–20)
CALCIUM: 9.3 mg/dL (ref 8.9–10.3)
CO2: 24 mmol/L (ref 22–32)
CREATININE: 1.02 mg/dL (ref 0.61–1.24)
Chloride: 101 mmol/L (ref 101–111)
GFR calc Af Amer: >60 mL/min (ref >60)
Glucose, Bld: 119 mg/dL — ABNORMAL HIGH (ref 65–99)
Potassium: 3.5 mmol/L (ref 3.5–5.1)
Sodium: 135 mmol/L (ref 135–145)
TOTAL PROTEIN: 6.9 g/dL (ref 6.5–8.1)

## 2017-11-15 LAB — CBC WITH DIFFERENTIAL/PLATELET
BASOS ABS: 0 10*3/uL (ref 0–0.1)
BASOS PCT: 1 %
EOS ABS: 0.1 10*3/uL (ref 0–0.7)
EOS PCT: 2 %
HCT: 31.5 % — ABNORMAL LOW (ref 40.0–52.0)
Hemoglobin: 10.5 g/dL — ABNORMAL LOW (ref 13.0–18.0)
Lymphocytes Relative: 21 %
Lymphs Abs: 1 10*3/uL (ref 1.0–3.6)
MCH: 30.5 pg (ref 26.0–34.0)
MCHC: 33.5 g/dL (ref 32.0–36.0)
MCV: 91.1 fL (ref 80.0–100.0)
Monocytes Absolute: 0.4 10*3/uL (ref 0.2–1.0)
Monocytes Relative: 9 %
Neutro Abs: 3.2 10*3/uL (ref 1.4–6.5)
Neutrophils Relative %: 67 %
PLATELETS: 189 10*3/uL (ref 150–440)
RBC: 3.46 MIL/uL — AB (ref 4.40–5.90)
RDW: 17.1 % — ABNORMAL HIGH (ref 11.5–14.5)
WBC: 4.7 10*3/uL (ref 3.8–10.6)

## 2017-11-15 LAB — MAGNESIUM: Magnesium: 1.5 mg/dL — ABNORMAL LOW (ref 1.7–2.4)

## 2017-11-15 LAB — PSA: PROSTATIC SPECIFIC ANTIGEN: 5.64 ng/mL — AB (ref 0.00–4.00)

## 2017-11-15 NOTE — Progress Notes (Signed)
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 He had biochemical recurrence in 2017 and was treated with IM RT. He again began to have a rising PSA in 2010 and underwent orchiectomy in 2010.his PSA was being monitored closely PSA a year ago was 0.5, 5 months ago was 3.9 in 2 months ago was 7.8.he underwent CT abdomen and pelvis in May 2018which showed abnormal periaortic and right common iliac lymph nodes. Index aortocaval lymph node 1.4 cm which was new as compared to February 2014 and suspicious for malignancy. Bone scan did not reveal any evidence of bony metastatic disease. He has been referred to [place] for systemic therapy options for CRPC  Patient lives with his wife and is independent of his ADL's. He has A fib for which he is on coumadin. Also has chronic urinary incontinence from prior prostate surgery and has AUS. Reports 50 pound intentional weight loss since October 2017  Repeat PSA in July 2018 was 13.3 indicating PSA doubling time of 3 months  Docetaxel chemo initiated for CRPC with ln mets and rapid doubling PSA in august 2018.  Patient completed 5 cycles of docetaxel on 09/27/2017.  Cycle #6 was not given due to progressive fatigue and cytopenias as well as worsening neuropathy.  Cycle 5 of docetaxel was dose reduced to 60 mg/m square    Interval history- he feels better since stopping chemotherapy. He could not tolerate cymbalta as it made him sleepy. Reports some tingling  numbness in his extremities that is stable to improving  ECOG PS- 1 Pain scale- 0 Opioid associated constipation- no  Review of systems- Review of Systems  Constitutional: Positive for malaise/fatigue. Negative for chills, fever and weight loss.  HENT: Negative for congestion, ear discharge and nosebleeds.   Eyes: Negative for blurred vision.  Respiratory: Negative for cough, hemoptysis, sputum production, shortness of breath and wheezing.   Cardiovascular: Negative for chest pain, palpitations, orthopnea and claudication.  Gastrointestinal: Negative for abdominal pain, blood in stool, constipation, diarrhea, heartburn, melena, nausea and vomiting.  Genitourinary: Negative for dysuria, flank pain, frequency, hematuria and urgency.  Musculoskeletal: Negative for back pain, joint pain and myalgias.  Skin: Negative for rash.  Neurological: Positive for tingling. Negative for dizziness, focal weakness, seizures, weakness and headaches.  Endo/Heme/Allergies: Does not bruise/bleed easily.  Psychiatric/Behavioral: Negative for depression and suicidal ideas. The patient does not have insomnia.      Allergies  Allergen Reactions  . Latex Rash    Sensitivity, not allergy.     Past Medical History:  Diagnosis Date  . Arthritis, degenerative 10/30/2015  . Atrial fibrillation ([place]) 10/30/2015  . Bladder spasm 06/12/2013  . Carcinoma of prostate ([place]) 10/30/2015  . Cardiomyopathy ([place]) 09/10/2014   Overview:  EF 35%,4/15   . Cerebrovascular accident (CVA) ([place]) 10/30/2015   Overview:  Left embolic AOZ,3086   . Difficult or painful urination 11/21/2013  . Dysrhythmia   . Gout 10/30/2015  . History of kidney stones   . Malignant neoplasm of prostate ([place]) 09/05/2012  .  Sinoatrial node dysfunction ([facility]) 10/30/2015   Overview:  DDD pacemaker, 1996      Past Surgical History:  Procedure Laterality Date  . castration  10/2009  . CATARACT EXTRACTION W/ INTRAOCULAR LENS  IMPLANT, BILATERAL  Bilateral 2016   right eye done then the left one done 3 months later in 2016  . INGUINAL HERNIA REPAIR Left   . INSERT / REPLACE / REMOVE PACEMAKER    . IR FLUORO GUIDE PORT INSERTION RIGHT  07/01/2017  . JOINT REPLACEMENT Bilateral 1980   hips  . left knee replacement  2014  . PACEMAKER INSERTION    . PROSTATECTOMY    . REPLACEMENT TOTAL HIP W/  RESURFACING IMPLANTS  1997   bilateral-   . REPLACEMENT TOTAL KNEE Bilateral   . testes removal     per pt   . TONSILLECTOMY AND ADENOIDECTOMY    . TRANSURETHRAL RESECTION OF BLADDER TUMOR N/A 12/31/2015   Procedure: BLADDER BIOPSY;  Surgeon: Nickie Retort, MD;  Location: [facility] ORS;  Service: Urology;  Laterality: N/A;  . Urinary incontinence valve     AMS    Social History   Socioeconomic History  . Marital status: Married    Spouse name: Not on file  . Number of children: Not on file  . Years of education: Not on file  . Highest education level: Not on file  Social Needs  . Financial resource strain: Not on file  . Food insecurity - worry: Not on file  . Food insecurity - inability: Not on file  . Transportation needs - medical: Not on file  . Transportation needs - non-medical: Not on file  Occupational History  . Not on file  Tobacco Use  . Smoking status: Never Smoker  . Smokeless tobacco: Never Used  Substance and Sexual Activity  . Alcohol use: No    Alcohol/week: 0.0 oz  . Drug use: No  . Sexual activity: No  Other Topics Concern  . Not on file  Social History Narrative  . Not on file    Family History  Problem Relation Age of Onset  . Cancer Mother   . Arthritis Mother   . Heart attack Father   . Arthritis Father   . Transient ischemic attack Brother   . Prostate cancer Neg Hx   . Bladder Cancer Neg Hx      Current Outpatient Medications:  .  acetaminophen (TYLENOL) 325 MG tablet, Take 650 mg by mouth 2 (two) times daily., Disp: , Rfl:  .  allopurinol (ZYLOPRIM) 300 MG tablet, Take by mouth daily.  In am., Disp: , Rfl:  .  dexamethasone (DECADRON) 4 MG tablet, Take 2 tablets (8 mg total) by mouth 2 (two) times daily. Start the day before Taxotere. Then daily after chemo for 2 days., Disp: 30 tablet, Rfl: 1 .  DULoxetine (CYMBALTA) 30 MG capsule, Take 1 capsule (30 mg total) daily by mouth., Disp: 60 capsule, Rfl: 3 .  Glucosamine Sulfate 1000 MG CAPS, Take 1 capsule by mouth 2 (two) times daily. , Disp: , Rfl:  .  hydrochlorothiazide (HYDRODIURIL) 25 MG tablet, Take 25 mg by mouth. In am, Disp: , Rfl:  .  lidocaine-prilocaine (EMLA) cream, Apply to affected area once, Disp: 30 g, Rfl: 3 .  LORazepam (ATIVAN) 0.5 MG tablet, Take 1 tablet (0.5 mg total) by mouth every 6 (six) hours as needed (Nausea or vomiting)., Disp: 30 tablet, Rfl: 0 .  metaxalone (SKELAXIN) 800 MG tablet, Take 1 tablet (  800 mg total) by mouth 3 (three) times daily., Disp: 21 tablet, Rfl: 0 .  ondansetron (ZOFRAN) 8 MG tablet, Take 1 tablet (8 mg total) by mouth 2 (two) times daily as needed for refractory nausea / vomiting., Disp: 30 tablet, Rfl: 1 .  phentermine 15 MG capsule, Take 15 mg by mouth every morning., Disp: , Rfl:  .  prochlorperazine (COMPAZINE) 10 MG tablet, Take 1 tablet (10 mg total) by mouth every 6 (six) hours as needed (Nausea or vomiting)., Disp: 30 tablet, Rfl: 1 .  simvastatin (ZOCOR) 40 MG tablet, Take 40 mg by mouth daily at 6 PM. , Disp: , Rfl:  .  warfarin (COUMADIN) 2 MG tablet, Take 2 mg by mouth once a week. On saturdays., Disp: , Rfl:  .  warfarin (COUMADIN) 5 MG tablet, TAKE ONE (1) TABLET EACH DAY, Disp: , Rfl:   Current Facility-Administered Medications:  .  lidocaine (XYLOCAINE) 2 % jelly 1 application, 1 application, Urethral, Once, Nickie Retort, MD  Physical exam:  Vitals:   11/15/17 1457  BP: 137/69  Pulse: 77  Resp: 18  Temp: (!) 87.8 F (31 C)  TempSrc: Tympanic  Weight: 271 lb (122.9 kg)   Physical Exam  Constitutional: He is oriented to person, place, and time and  well-developed, well-nourished, and in no distress.  HENT:  Head: Normocephalic and atraumatic.  Eyes: EOM are normal. Pupils are equal, round, and reactive to light.  Neck: Normal range of motion.  Cardiovascular: Normal rate, regular rhythm and normal heart sounds.  Pulmonary/Chest: Effort normal and breath sounds normal.  Abdominal: Soft. Bowel sounds are normal.  Neurological: He is alert and oriented to person, place, and time.  Skin: Skin is warm and dry.     CMP Latest Ref Rng & Units 11/15/2017  Glucose 65 - 99 mg/dL 119(H)  BUN 6 - 20 mg/dL 22(H)  Creatinine 0.61 - 1.24 mg/dL 1.02  Sodium 135 - 145 mmol/L 135  Potassium 3.5 - 5.1 mmol/L 3.5  Chloride 101 - 111 mmol/L 101  CO2 22 - 32 mmol/L 24  Calcium 8.9 - 10.3 mg/dL 9.3  Total Protein 6.5 - 8.1 g/dL 6.9  Total Bilirubin 0.3 - 1.2 mg/dL 0.9  Alkaline Phos 38 - 126 U/L 81  AST 15 - 41 U/L 22  ALT 17 - 63 U/L 8(L)   CBC Latest Ref Rng & Units 11/15/2017  WBC 3.8 - 10.6 K/uL 4.7  Hemoglobin 13.0 - 18.0 g/dL 10.5(L)  Hematocrit 40.0 - 52.0 % 31.5(L)  Platelets 150 - 440 K/uL 189    No images are attached to the encounter.  Ct Chest W Contrast  Result Date: 11/02/2017 CLINICAL DATA:  Prostate cancer EXAM: CT CHEST, ABDOMEN, AND PELVIS WITH CONTRAST TECHNIQUE: Multidetector CT imaging of the chest, abdomen and pelvis was performed following the standard protocol during bolus administration of intravenous contrast. CONTRAST:  15mL ISOVUE-300 IOPAMIDOL (ISOVUE-300) INJECTION 61% COMPARISON:  Abdomen and pelvis CT 04/28/2017 FINDINGS: CT CHEST FINDINGS Cardiovascular: The heart is enlarged. Coronary artery calcification is evident. Atherosclerotic calcification is noted in the wall of the thoracic aorta. Left permanent pacemaker noted. Right Port-A-Cath tip is positioned in the mid right atrium. Mediastinum/Nodes: No mediastinal lymphadenopathy. There is no hilar lymphadenopathy. The esophagus has normal imaging features.  There is no axillary lymphadenopathy. Lungs/Pleura: Compressive atelectasis noted both lower lobes. 5 mm pulmonary nodule identified superior segment left lower lobe (image 54 series 4). Musculoskeletal: Bone windows reveal no worrisome lytic or sclerotic osseous  lesions. Bilateral gynecomastia noted. CT ABDOMEN PELVIS FINDINGS Hepatobiliary: No focal abnormality within the liver parenchyma. Possible very tiny layering gallstones. No intrahepatic or extrahepatic biliary dilation. Pancreas: No focal mass lesion. No dilatation of the main duct. No intraparenchymal cyst. No peripancreatic edema. Spleen: No splenomegaly. No focal mass lesion. Adrenals/Urinary Tract: No adrenal nodule or mass. Cortical scarring noted both kidneys. Central sinus cysts left kidney similar to prior. No evidence for hydroureter. Mild circumferential bladder wall thickening is evident despite underdistention. Stomach/Bowel: Stomach is nondistended. No gastric wall thickening. No evidence of outlet obstruction. Duodenum is normally positioned as is the ligament of Treitz. No small bowel wall thickening. No small bowel dilatation. The terminal ileum is normal. The appendix is normal. No gross colonic mass. No colonic wall thickening. No substantial diverticular change. Vascular/Lymphatic: There is abdominal aortic atherosclerosis without aneurysm. No gastrohepatic or hepatoduodenal ligament lymphadenopathy. Tiny retroperitoneal lymph nodes are evident in the para- aortic space but no retroperitoneal lymphadenopathy. Index 14 mm short axis aortocaval lymph node measured on the prior study is now 9 mm. 10 mm short axis lymph node in the common external iliac chain (image 92 series 8) was 14 mm previously. Reproductive: Prostate gland surgically absent. Other: No intraperitoneal free fluid. Musculoskeletal: Patient is status post bilateral hip replacement. Bone windows reveal no worrisome lytic or sclerotic osseous lesions. Interval development of  T12 compression deformity. IMPRESSION: 1. Interval decrease in the para-aortic and right common iliac lymphadenopathy seen previously. 2. Stable central sinus cyst lower pole left kidney. Complex elements seen on the prior study are less apparent today. 3. Interval development of T12 compression fracture 4.  Aortic Atherosclerois (ICD10-170.0) 5. 5 mm left lower lobe pulmonary nodule. No follow-up needed if patient is low-risk. Non-contrast chest CT can be considered in 12 months if patient is high-risk. This recommendation follows the consensus statement: Guidelines for Management of Incidental Pulmonary Nodules Detected on CT Images: From the Fleischner Society 2017; Radiology 2017; 284:228-243. Electronically Signed   By: Misty Stanley M.D.   On: 11/02/2017 13:04   Nm Bone Scan Whole Body  Result Date: 11/02/2017 CLINICAL DATA:  Prostate cancer, mid back pain after lifting a heavy object at home 2 months ago, history of BILATERAL hip and knee replacement surgeries EXAM: NUCLEAR MEDICINE WHOLE BODY BONE SCAN TECHNIQUE: Whole body anterior and posterior images were obtained approximately 3 hours after intravenous injection of radiopharmaceutical. RADIOPHARMACEUTICALS:  22.22 mCi Technetium-36m MDP IV COMPARISON:  04/28/2017 Radiographic correlation:  CT chest abdomen pelvis 11/02/2017 FINDINGS: Photopenic defects at the knees and hips bilaterally from prior joint replacement surgery. Subtle focus of increased tracer accumulation at the tip of the femoral component of the LEFT hip prosthesis, nonspecific. New increased uptake at the anterior aspects of the LEFT fourth and sixth ribs, the adjacent nature of these highly suspicious for trauma ; recent CT exam demonstrates healing fractures of the anterior LEFT fourth and sixth ribs. Linear increased tracer localization in the lower thoracic spine at T12, corresponding to T12 compression fracture on CT. Uptake at the LEFT eleventh rib costovertebral junction, new,  also favoring potential traumatic etiology. Multiple sites of urinary contamination of the skin are identified. No definite scintigraphic findings are seen to suggest osseous metastatic disease. Uptake at the shoulders, wrists, and feet, typically degenerative. IMPRESSION: New areas of uptake at the T12 vertebral body and at the anterior LEFT fourth and sixth ribs corresponding to fractures on CT. Nonspecific increased tracer localization at the LEFT eleventh rib costovertebral junction, also  new, potentially traumatic or degenerative. Subtle focus of abnormal increased trace localization at the tip of the femoral component of the LEFT hip prosthesis is nonspecific, can be seen prosthetic loosening though a coincidental metastatic lesion at this site could cause a similar appearance. Electronically Signed   By: Lavonia Dana M.D.   On: 11/02/2017 17:58   Ct Abdomen Pelvis W Contrast  Result Date: 11/02/2017 CLINICAL DATA:  Prostate cancer EXAM: CT CHEST, ABDOMEN, AND PELVIS WITH CONTRAST TECHNIQUE: Multidetector CT imaging of the chest, abdomen and pelvis was performed following the standard protocol during bolus administration of intravenous contrast. CONTRAST:  144mL ISOVUE-300 IOPAMIDOL (ISOVUE-300) INJECTION 61% COMPARISON:  Abdomen and pelvis CT 04/28/2017 FINDINGS: CT CHEST FINDINGS Cardiovascular: The heart is enlarged. Coronary artery calcification is evident. Atherosclerotic calcification is noted in the wall of the thoracic aorta. Left permanent pacemaker noted. Right Port-A-Cath tip is positioned in the mid right atrium. Mediastinum/Nodes: No mediastinal lymphadenopathy. There is no hilar lymphadenopathy. The esophagus has normal imaging features. There is no axillary lymphadenopathy. Lungs/Pleura: Compressive atelectasis noted both lower lobes. 5 mm pulmonary nodule identified superior segment left lower lobe (image 54 series 4). Musculoskeletal: Bone windows reveal no worrisome lytic or sclerotic  osseous lesions. Bilateral gynecomastia noted. CT ABDOMEN PELVIS FINDINGS Hepatobiliary: No focal abnormality within the liver parenchyma. Possible very tiny layering gallstones. No intrahepatic or extrahepatic biliary dilation. Pancreas: No focal mass lesion. No dilatation of the main duct. No intraparenchymal cyst. No peripancreatic edema. Spleen: No splenomegaly. No focal mass lesion. Adrenals/Urinary Tract: No adrenal nodule or mass. Cortical scarring noted both kidneys. Central sinus cysts left kidney similar to prior. No evidence for hydroureter. Mild circumferential bladder wall thickening is evident despite underdistention. Stomach/Bowel: Stomach is nondistended. No gastric wall thickening. No evidence of outlet obstruction. Duodenum is normally positioned as is the ligament of Treitz. No small bowel wall thickening. No small bowel dilatation. The terminal ileum is normal. The appendix is normal. No gross colonic mass. No colonic wall thickening. No substantial diverticular change. Vascular/Lymphatic: There is abdominal aortic atherosclerosis without aneurysm. No gastrohepatic or hepatoduodenal ligament lymphadenopathy. Tiny retroperitoneal lymph nodes are evident in the para- aortic space but no retroperitoneal lymphadenopathy. Index 14 mm short axis aortocaval lymph node measured on the prior study is now 9 mm. 10 mm short axis lymph node in the common external iliac chain (image 92 series 8) was 14 mm previously. Reproductive: Prostate gland surgically absent. Other: No intraperitoneal free fluid. Musculoskeletal: Patient is status post bilateral hip replacement. Bone windows reveal no worrisome lytic or sclerotic osseous lesions. Interval development of T12 compression deformity. IMPRESSION: 1. Interval decrease in the para-aortic and right common iliac lymphadenopathy seen previously. 2. Stable central sinus cyst lower pole left kidney. Complex elements seen on the prior study are less apparent today.  3. Interval development of T12 compression fracture 4.  Aortic Atherosclerois (ICD10-170.0) 5. 5 mm left lower lobe pulmonary nodule. No follow-up needed if patient is low-risk. Non-contrast chest CT can be considered in 12 months if patient is high-risk. This recommendation follows the consensus statement: Guidelines for Management of Incidental Pulmonary Nodules Detected on CT Images: From the Fleischner Society 2017; Radiology 2017; 284:228-243. Electronically Signed   By: Misty Stanley M.D.   On: 11/02/2017 13:04     Assessment and plan- Patient is a 71 y.o. male with castrate resistant metastatic prostate cancer with lymph node metastases status post 5 cycles of docetaxel  1.  Prostate cancer: I have personally reviewed patient's  CT scan as well as bone scan.  There is interval decrease in the size of the para-aortic and iliac adenopathy.  There are no new sites of metastatic disease.  At this time plan is to continue observation and monitor his PSA closely.  Patient's PSA decreased from 13.3-7.27 in October 2018.  PSA from today is pending.  We will continue to follow PSA until it reaches its nadir and consider doing scans when it starts rising again.  Patient went verbalized understanding of the plan.  2.  Chemo-induced peripheral neuropathy: now off cymbalta. Continue to monitor  3.  Compression fracture noted on T12 vertebra on the bone scan: reports mild self limited pain and would like to defer kyphoplasty at this time  4. Chemotherapy induced anemia- improved. Continue to monitor  Will I will check repeat CBC CMP and PSA in 6 weeks and 12 weeks and see him back in 12 weeks time.   Visit Diagnosis 1. Carcinoma of prostate ([place])   2. Regional lymph node metastasis present ([place])   3. Antineoplastic chemotherapy induced anemia   4. Chemotherapy-induced peripheral neuropathy ([place])      Dr. Randa Evens, MD, MPH [facility] Pager-  [phone] 11/15/2017 4:58 PM

## 2017-11-15 NOTE — Progress Notes (Signed)
Patient here today for follow up with labs. He states that he is feeling better and his back is slowly getting well.

## 2017-11-16 ENCOUNTER — Ambulatory Visit: Payer: PPO

## 2017-11-22 ENCOUNTER — Other Ambulatory Visit: Payer: PPO

## 2017-11-22 DIAGNOSIS — I4891 Unspecified atrial fibrillation: Secondary | ICD-10-CM | POA: Diagnosis not present

## 2017-11-24 ENCOUNTER — Ambulatory Visit: Payer: PPO

## 2017-12-06 DIAGNOSIS — Z91040 Latex allergy status: Secondary | ICD-10-CM | POA: Diagnosis not present

## 2017-12-06 DIAGNOSIS — D72810 Lymphocytopenia: Secondary | ICD-10-CM | POA: Diagnosis not present

## 2017-12-06 DIAGNOSIS — Z950 Presence of cardiac pacemaker: Secondary | ICD-10-CM | POA: Diagnosis not present

## 2017-12-06 DIAGNOSIS — M4854XA Collapsed vertebra, not elsewhere classified, thoracic region, initial encounter for fracture: Secondary | ICD-10-CM | POA: Diagnosis not present

## 2017-12-06 DIAGNOSIS — I4891 Unspecified atrial fibrillation: Secondary | ICD-10-CM | POA: Diagnosis not present

## 2017-12-06 DIAGNOSIS — S0990XA Unspecified injury of head, initial encounter: Secondary | ICD-10-CM | POA: Diagnosis not present

## 2017-12-06 DIAGNOSIS — E876 Hypokalemia: Secondary | ICD-10-CM | POA: Diagnosis not present

## 2017-12-06 DIAGNOSIS — I517 Cardiomegaly: Secondary | ICD-10-CM | POA: Diagnosis not present

## 2017-12-06 DIAGNOSIS — E7800 Pure hypercholesterolemia, unspecified: Secondary | ICD-10-CM | POA: Diagnosis not present

## 2017-12-06 DIAGNOSIS — K439 Ventral hernia without obstruction or gangrene: Secondary | ICD-10-CM | POA: Diagnosis not present

## 2017-12-06 DIAGNOSIS — M19041 Primary osteoarthritis, right hand: Secondary | ICD-10-CM | POA: Diagnosis not present

## 2017-12-06 DIAGNOSIS — S59912A Unspecified injury of left forearm, initial encounter: Secondary | ICD-10-CM | POA: Diagnosis not present

## 2017-12-06 DIAGNOSIS — M7989 Other specified soft tissue disorders: Secondary | ICD-10-CM | POA: Diagnosis not present

## 2017-12-06 DIAGNOSIS — M542 Cervicalgia: Secondary | ICD-10-CM | POA: Diagnosis not present

## 2017-12-06 DIAGNOSIS — G9389 Other specified disorders of brain: Secondary | ICD-10-CM | POA: Diagnosis not present

## 2017-12-06 DIAGNOSIS — D649 Anemia, unspecified: Secondary | ICD-10-CM | POA: Diagnosis not present

## 2017-12-06 DIAGNOSIS — K5730 Diverticulosis of large intestine without perforation or abscess without bleeding: Secondary | ICD-10-CM | POA: Diagnosis not present

## 2017-12-06 DIAGNOSIS — S01111A Laceration without foreign body of right eyelid and periocular area, initial encounter: Secondary | ICD-10-CM | POA: Diagnosis not present

## 2017-12-06 DIAGNOSIS — S3991XA Unspecified injury of abdomen, initial encounter: Secondary | ICD-10-CM | POA: Diagnosis not present

## 2017-12-06 DIAGNOSIS — S0993XA Unspecified injury of face, initial encounter: Secondary | ICD-10-CM | POA: Diagnosis not present

## 2017-12-06 DIAGNOSIS — S199XXA Unspecified injury of neck, initial encounter: Secondary | ICD-10-CM | POA: Diagnosis not present

## 2017-12-06 DIAGNOSIS — S299XXA Unspecified injury of thorax, initial encounter: Secondary | ICD-10-CM | POA: Diagnosis not present

## 2017-12-06 DIAGNOSIS — I700 Atherosclerosis of aorta: Secondary | ICD-10-CM | POA: Diagnosis not present

## 2017-12-06 DIAGNOSIS — R930 Abnormal findings on diagnostic imaging of skull and head, not elsewhere classified: Secondary | ICD-10-CM | POA: Diagnosis not present

## 2017-12-06 DIAGNOSIS — Z8673 Personal history of transient ischemic attack (TIA), and cerebral infarction without residual deficits: Secondary | ICD-10-CM | POA: Diagnosis not present

## 2017-12-06 DIAGNOSIS — R51 Headache: Secondary | ICD-10-CM | POA: Diagnosis not present

## 2017-12-06 DIAGNOSIS — M79642 Pain in left hand: Secondary | ICD-10-CM | POA: Diagnosis not present

## 2017-12-20 ENCOUNTER — Inpatient Hospital Stay: Payer: PPO | Attending: Oncology

## 2017-12-20 DIAGNOSIS — Z95828 Presence of other vascular implants and grafts: Secondary | ICD-10-CM

## 2017-12-20 DIAGNOSIS — C61 Malignant neoplasm of prostate: Secondary | ICD-10-CM | POA: Insufficient documentation

## 2017-12-20 DIAGNOSIS — Z452 Encounter for adjustment and management of vascular access device: Secondary | ICD-10-CM | POA: Insufficient documentation

## 2017-12-20 MED ORDER — SODIUM CHLORIDE 0.9% FLUSH
10.0000 mL | INTRAVENOUS | Status: AC | PRN
  Administered 2017-12-20: 10 mL
  Filled 2017-12-20: qty 10

## 2017-12-20 MED ORDER — HEPARIN SOD (PORK) LOCK FLUSH 100 UNIT/ML IV SOLN
500.0000 [IU] | INTRAVENOUS | Status: AC | PRN
  Administered 2017-12-20: 500 [IU]

## 2017-12-26 DIAGNOSIS — I4891 Unspecified atrial fibrillation: Secondary | ICD-10-CM | POA: Diagnosis not present

## 2018-01-10 DIAGNOSIS — I482 Chronic atrial fibrillation: Secondary | ICD-10-CM | POA: Diagnosis not present

## 2018-01-10 DIAGNOSIS — Z4802 Encounter for removal of sutures: Secondary | ICD-10-CM | POA: Diagnosis not present

## 2018-01-20 ENCOUNTER — Ambulatory Visit: Payer: PPO

## 2018-01-30 DIAGNOSIS — I482 Chronic atrial fibrillation: Secondary | ICD-10-CM | POA: Diagnosis not present

## 2018-02-28 ENCOUNTER — Inpatient Hospital Stay: Payer: PPO | Attending: Oncology | Admitting: *Deleted

## 2018-02-28 ENCOUNTER — Inpatient Hospital Stay (HOSPITAL_BASED_OUTPATIENT_CLINIC_OR_DEPARTMENT_OTHER): Payer: PPO | Admitting: Oncology

## 2018-02-28 ENCOUNTER — Encounter: Payer: Self-pay | Admitting: Oncology

## 2018-02-28 VITALS — BP 109/82 | HR 80 | Temp 98.2°F | Resp 18 | Ht 72.84 in | Wt 262.7 lb

## 2018-02-28 DIAGNOSIS — I482 Chronic atrial fibrillation: Secondary | ICD-10-CM | POA: Diagnosis not present

## 2018-02-28 DIAGNOSIS — I4891 Unspecified atrial fibrillation: Secondary | ICD-10-CM | POA: Diagnosis not present

## 2018-02-28 DIAGNOSIS — C61 Malignant neoplasm of prostate: Secondary | ICD-10-CM | POA: Diagnosis not present

## 2018-02-28 DIAGNOSIS — G620 Drug-induced polyneuropathy: Secondary | ICD-10-CM

## 2018-02-28 DIAGNOSIS — Z923 Personal history of irradiation: Secondary | ICD-10-CM | POA: Diagnosis not present

## 2018-02-28 DIAGNOSIS — C779 Secondary and unspecified malignant neoplasm of lymph node, unspecified: Secondary | ICD-10-CM

## 2018-02-28 DIAGNOSIS — Z95828 Presence of other vascular implants and grafts: Secondary | ICD-10-CM

## 2018-02-28 LAB — CBC WITH DIFFERENTIAL/PLATELET
BASOS ABS: 0 10*3/uL (ref 0–0.1)
Basophils Relative: 1 %
Eosinophils Absolute: 0.1 10*3/uL (ref 0–0.7)
Eosinophils Relative: 2 %
HCT: 32.9 % — ABNORMAL LOW (ref 40.0–52.0)
HEMOGLOBIN: 11.2 g/dL — AB (ref 13.0–18.0)
LYMPHS PCT: 29 %
Lymphs Abs: 1 10*3/uL (ref 1.0–3.6)
MCH: 28.8 pg (ref 26.0–34.0)
MCHC: 34.1 g/dL (ref 32.0–36.0)
MCV: 84.5 fL (ref 80.0–100.0)
Monocytes Absolute: 0.3 10*3/uL (ref 0.2–1.0)
Monocytes Relative: 8 %
NEUTROS ABS: 2.1 10*3/uL (ref 1.4–6.5)
Neutrophils Relative %: 60 %
Platelets: 127 10*3/uL — ABNORMAL LOW (ref 150–440)
RBC: 3.89 MIL/uL — AB (ref 4.40–5.90)
RDW: 16.5 % — ABNORMAL HIGH (ref 11.5–14.5)
WBC: 3.5 10*3/uL — AB (ref 3.8–10.6)

## 2018-02-28 LAB — COMPREHENSIVE METABOLIC PANEL
ALBUMIN: 4.3 g/dL (ref 3.5–5.0)
ALT: 9 U/L — ABNORMAL LOW (ref 17–63)
ANION GAP: 10 (ref 5–15)
AST: 19 U/L (ref 15–41)
Alkaline Phosphatase: 70 U/L (ref 38–126)
BILIRUBIN TOTAL: 1.3 mg/dL — AB (ref 0.3–1.2)
BUN: 29 mg/dL — ABNORMAL HIGH (ref 6–20)
CO2: 22 mmol/L (ref 22–32)
Calcium: 9.5 mg/dL (ref 8.9–10.3)
Chloride: 104 mmol/L (ref 101–111)
Creatinine, Ser: 0.83 mg/dL (ref 0.61–1.24)
GFR calc Af Amer: >60 mL/min (ref >60)
GFR calc non Af Amer: >60 mL/min (ref >60)
GLUCOSE: 105 mg/dL — AB (ref 65–99)
POTASSIUM: 3.5 mmol/L (ref 3.5–5.1)
Sodium: 136 mmol/L (ref 135–145)
TOTAL PROTEIN: 6.9 g/dL (ref 6.5–8.1)

## 2018-02-28 LAB — PSA: Prostatic Specific Antigen: 15.74 ng/mL — ABNORMAL HIGH (ref 0.00–4.00)

## 2018-02-28 MED ORDER — SODIUM CHLORIDE 0.9% FLUSH
10.0000 mL | INTRAVENOUS | Status: AC | PRN
  Administered 2018-02-28: 10 mL
  Filled 2018-02-28: qty 10

## 2018-02-28 MED ORDER — HEPARIN SOD (PORK) LOCK FLUSH 100 UNIT/ML IV SOLN
500.0000 [IU] | INTRAVENOUS | Status: AC | PRN
  Administered 2018-02-28: 500 [IU]

## 2018-02-28 NOTE — Progress Notes (Signed)
No new changes noted today 

## 2018-03-02 ENCOUNTER — Other Ambulatory Visit: Payer: Self-pay | Admitting: *Deleted

## 2018-03-02 ENCOUNTER — Telehealth: Payer: Self-pay | Admitting: *Deleted

## 2018-03-02 ENCOUNTER — Ambulatory Visit: Payer: PPO

## 2018-03-02 ENCOUNTER — Telehealth: Payer: Self-pay | Admitting: Oncology

## 2018-03-02 DIAGNOSIS — C61 Malignant neoplasm of prostate: Secondary | ICD-10-CM

## 2018-03-02 DIAGNOSIS — R9721 Rising PSA following treatment for malignant neoplasm of prostate: Secondary | ICD-10-CM

## 2018-03-02 NOTE — Progress Notes (Signed)
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 He had biochemical recurrence in 2017 and was treated with IM RT. He again began to have a rising PSA in 2010 and underwent orchiectomy in 2010.his PSA was being monitored closely PSA a year ago was 0.5, 5 months ago was 3.9 in 2 months ago was 7.8.he underwent CT abdomen and pelvis in May 2018which showed abnormal periaortic and right common iliac lymph nodes. Index aortocaval lymph node 1.4 cm which was new as compared to February 2014 and suspicious for malignancy. Bone scan did not reveal any evidence of bony metastatic disease. He has been referred to [place] for systemic therapy options for CRPC  Patient lives with his wife and is independent of his ADL's. He has A fib for which he is on coumadin. Also has chronic urinary incontinence from prior prostate surgery and has AUS. Reports 50 pound intentional weight loss since October 2017  Repeat PSA in July 2018 was 13.3 indicating PSA doubling time of 3 months  Docetaxel chemo initiated for CRPC with ln mets and rapid doubling PSA in august 2018.  Patient completed 5 cycles of docetaxel on 09/27/2017.  Cycle #6 was not given due to progressive fatigue and cytopenias as well as worsening neuropathy.  Cycle 5 of docetaxel was dose reduced to 60 mg/m square   Interval history- reports feeling much better since stopping chemotherapy. He has mild tingling/ numbness in his extremities. No weakness. He  has not had any recent falls since last visit. He has lost 10 pounds in the last 3 months but says it was intentional with diet moddification and phentermine. Denies any back pain  ECOG PS- 1 Pain scale- 0 Opioid associated constipation- no  Review of systems- Review of Systems  Constitutional: Negative for chills, fever, malaise/fatigue and weight loss.  HENT: Negative for congestion, ear discharge and nosebleeds.   Eyes: Negative for blurred vision.  Respiratory: Negative for cough, hemoptysis, sputum production, shortness of breath and wheezing.   Cardiovascular: Negative for chest pain, palpitations, orthopnea and claudication.  Gastrointestinal: Negative for abdominal pain, blood in stool, constipation, diarrhea, heartburn, melena, nausea and vomiting.  Genitourinary: Negative for dysuria, flank pain, frequency, hematuria and urgency.  Musculoskeletal: Negative for back pain, joint pain and myalgias.  Skin: Negative for rash.  Neurological: Negative for dizziness, tingling, focal weakness, seizures, weakness and headaches.  Endo/Heme/Allergies: Does not bruise/bleed easily.  Psychiatric/Behavioral: Negative for depression and suicidal ideas. The patient does not have insomnia.       Allergies  Allergen Reactions  . Latex Rash    Sensitivity, not allergy.     Past Medical History:  Diagnosis Date  . Arthritis, degenerative 10/30/2015  . Atrial fibrillation ([place]) 10/30/2015  . Bladder spasm 06/12/2013  . Carcinoma of prostate ([place]) 10/30/2015  . Cardiomyopathy ([place]) 09/10/2014   Overview:  EF 35%,4/15   . Cerebrovascular accident (CVA) ([place]) 10/30/2015   Overview:  Left embolic [id number]   . Difficult or painful urination 11/21/2013  . Dysrhythmia   .  Gout 10/30/2015  . History of kidney stones   . Malignant neoplasm of prostate ([place]) 09/05/2012  . Sinoatrial node dysfunction ([facility]) 10/30/2015   Overview:  DDD pacemaker, 1996      Past Surgical History:  Procedure  Laterality Date  . castration  10/2009  . CATARACT EXTRACTION W/ INTRAOCULAR LENS  IMPLANT, BILATERAL Bilateral 2016   right eye done then the left one done 3 months later in 2016  . INGUINAL HERNIA REPAIR Left   . INSERT / REPLACE / REMOVE PACEMAKER    . IR FLUORO GUIDE PORT INSERTION RIGHT  07/01/2017  . JOINT REPLACEMENT Bilateral 1980   hips  . left knee replacement  2014  . PACEMAKER INSERTION    . PROSTATECTOMY    . REPLACEMENT TOTAL HIP W/  RESURFACING IMPLANTS  1997   bilateral-   . REPLACEMENT TOTAL KNEE Bilateral   . testes removal     per pt   . TONSILLECTOMY AND ADENOIDECTOMY    . TRANSURETHRAL RESECTION OF BLADDER TUMOR N/A 12/31/2015   Procedure: BLADDER BIOPSY;  Surgeon: Nickie Retort, MD;  Location: [facility] ORS;  Service: Urology;  Laterality: N/A;  . Urinary incontinence valve     AMS    Social History   Socioeconomic History  . Marital status: Married    Spouse name: Not on file  . Number of children: Not on file  . Years of education: Not on file  . Highest education level: Not on file  Occupational History  . Not on file  Social Needs  . Financial resource strain: Not on file  . Food insecurity:    Worry: Not on file    Inability: Not on file  . Transportation needs:    Medical: Not on file    Non-medical: Not on file  Tobacco Use  . Smoking status: Never Smoker  . Smokeless tobacco: Never Used  Substance and Sexual Activity  . Alcohol use: No    Alcohol/week: 0.0 oz  . Drug use: No  . Sexual activity: Never  Lifestyle  . Physical activity:    Days per week: Not on file    Minutes per session: Not on file  . Stress: Not on file  Relationships  . Social connections:    Talks on phone: Not on file    Gets together: Not on file    Attends religious service: Not on file    Active member of club or organization: Not on file    Attends meetings of clubs or organizations: Not on file    Relationship status: Not on file  . Intimate partner  violence:    Fear of current or ex partner: Not on file    Emotionally abused: Not on file    Physically abused: Not on file    Forced sexual activity: Not on file  Other Topics Concern  . Not on file  Social History Narrative  . Not on file    Family History  Problem Relation Age of Onset  . Cancer Mother   . Arthritis Mother   . Heart attack Father   . Arthritis Father   . Transient ischemic attack Brother   . Prostate cancer Neg Hx   . Bladder Cancer Neg Hx      Current Outpatient Medications:  .  allopurinol (ZYLOPRIM) 300 MG tablet, Take by mouth daily. In am., Disp: , Rfl:  .  hydrochlorothiazide (HYDRODIURIL) 25 MG tablet, Take 25 mg by mouth. In am, Disp: ,  Rfl:  .  lidocaine-prilocaine (EMLA) cream, Apply to affected area once, Disp: 30 g, Rfl: 3 .  simvastatin (ZOCOR) 40 MG tablet, Take 40 mg by mouth daily at 6 PM. , Disp: , Rfl:  .  warfarin (COUMADIN) 2 MG tablet, Take 2 mg by mouth once a week. On saturdays., Disp: , Rfl:  .  warfarin (COUMADIN) 5 MG tablet, TAKE ONE (1) TABLET EACH DAY, Disp: , Rfl:  .  acetaminophen (TYLENOL) 325 MG tablet, Take 650 mg by mouth 2 (two) times daily., Disp: , Rfl:  .  dexamethasone (DECADRON) 4 MG tablet, Take 2 tablets (8 mg total) by mouth 2 (two) times daily. Start the day before Taxotere. Then daily after chemo for 2 days. (Patient not taking: Reported on 02/28/2018), Disp: 30 tablet, Rfl: 1 .  DULoxetine (CYMBALTA) 30 MG capsule, Take 1 capsule (30 mg total) daily by mouth. (Patient not taking: Reported on 11/15/2017), Disp: 60 capsule, Rfl: 3 .  Glucosamine Sulfate 1000 MG CAPS, Take 1 capsule by mouth 2 (two) times daily. , Disp: , Rfl:  .  LORazepam (ATIVAN) 0.5 MG tablet, Take 1 tablet (0.5 mg total) by mouth every 6 (six) hours as needed (Nausea or vomiting). (Patient not taking: Reported on 02/28/2018), Disp: 30 tablet, Rfl: 0 .  ondansetron (ZOFRAN) 8 MG tablet, Take 1 tablet (8 mg total) by mouth 2 (two) times daily as  needed for refractory nausea / vomiting. (Patient not taking: Reported on 02/28/2018), Disp: 30 tablet, Rfl: 1 .  phentermine 15 MG capsule, Take 15 mg by mouth every morning., Disp: , Rfl:  .  prochlorperazine (COMPAZINE) 10 MG tablet, Take 1 tablet (10 mg total) by mouth every 6 (six) hours as needed (Nausea or vomiting). (Patient not taking: Reported on 02/28/2018), Disp: 30 tablet, Rfl: 1  Current Facility-Administered Medications:  .  lidocaine (XYLOCAINE) 2 % jelly 1 application, 1 application, Urethral, Once, Nickie Retort, MD  Physical exam:  Vitals:   02/28/18 1424  BP: 109/82  Pulse: 80  Resp: 18  Temp: 98.2 F (36.8 C)  TempSrc: Tympanic  SpO2: 97%  Weight: 262 lb 11.2 oz (119.2 kg)  Height: 6' 0.84" (1.85 m)   Physical Exam  Constitutional: He is oriented to person, place, and time and well-developed, well-nourished, and in no distress.  HENT:  Head: Normocephalic and atraumatic.  Eyes: Pupils are equal, round, and reactive to light. EOM are normal.  Neck: Normal range of motion.  Cardiovascular: Normal rate, regular rhythm and normal heart sounds.  Pulmonary/Chest: Effort normal and breath sounds normal.  Abdominal: Soft. Bowel sounds are normal.  Neurological: He is alert and oriented to person, place, and time.  Skin: Skin is warm and dry.     CMP Latest Ref Rng & Units 02/28/2018  Glucose 65 - 99 mg/dL 105(H)  BUN 6 - 20 mg/dL 29(H)  Creatinine 0.61 - 1.24 mg/dL 0.83  Sodium 135 - 145 mmol/L 136  Potassium 3.5 - 5.1 mmol/L 3.5  Chloride 101 - 111 mmol/L 104  CO2 22 - 32 mmol/L 22  Calcium 8.9 - 10.3 mg/dL 9.5  Total Protein 6.5 - 8.1 g/dL 6.9  Total Bilirubin 0.3 - 1.2 mg/dL 1.3(H)  Alkaline Phos 38 - 126 U/L 70  AST 15 - 41 U/L 19  ALT 17 - 63 U/L 9(L)   CBC Latest Ref Rng & Units 02/28/2018  WBC 3.8 - 10.6 K/uL 3.5(L)  Hemoglobin 13.0 - 18.0 g/dL 11.2(L)  Hematocrit 40.0 -  52.0 % 32.9(L)  Platelets 150 - 440 K/uL 127(L)      Assessment and  plan- Patient is a 72 y.o. male with castrate resistant metastatic prostate cancer with lymph node metastases status post 5 cycles of docetaxel  Overall patient has recovered well from chemotherapy. He has mild leukopenia and thrombocytopenia. Anemia is improved. psa from today is pending. I will see him back in 3 months with cbc/ cmp and psa but potentitally sooenr if PSA is rising again.  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 Stable. Patient is not on any medications  Continue port flushes    Visit Diagnosis 1. Malignant neoplasm of prostate [facility])      Dr. Randa Evens, MD, MPH [facility] Pager- [phone] 03/02/2018 2:54 PM

## 2018-03-02 NOTE — Telephone Encounter (Signed)
Per Dr. Elroy Channel request, called patient and informed him of his elevated PSA and her recommendation for a CT Scan Chest, Abdomen and Pelvis, and a Bone Scan in the next two weeks. Patient was agreeable to this plan. Will send scheduling a message to contact patients with appointment details.         dhs

## 2018-03-02 NOTE — Telephone Encounter (Signed)
Schd and conf CT & Wholel Body Bone Scan with patient for Friday 03/17/18 with the arrival time of 9:30 a.m. at the [facility]. Patient also instructed to pick up 2 bottles of Oral Contrast with a copy of the instructions, prior to 03/17/18 and NPO from midnight the night before. Patient clearly understands and agree to appt dates and time. Also mailed a copy of the Appt schd to patient.

## 2018-03-16 ENCOUNTER — Other Ambulatory Visit: Payer: Self-pay | Admitting: *Deleted

## 2018-03-16 DIAGNOSIS — C61 Malignant neoplasm of prostate: Secondary | ICD-10-CM

## 2018-03-17 ENCOUNTER — Encounter
Admission: RE | Admit: 2018-03-17 | Discharge: 2018-03-17 | Disposition: A | Discharge status: Home / Self Care | Payer: PPO | Source: Ambulatory Visit | Attending: Oncology | Admitting: Oncology

## 2018-03-17 ENCOUNTER — Ambulatory Visit
Admission: RE | Admit: 2018-03-17 | Discharge: 2018-03-17 | Disposition: A | Discharge status: Home / Self Care | Payer: PPO | Source: Ambulatory Visit | Attending: Oncology | Admitting: Oncology

## 2018-03-17 DIAGNOSIS — I2510 Atherosclerotic heart disease of native coronary artery without angina pectoris: Secondary | ICD-10-CM | POA: Insufficient documentation

## 2018-03-17 DIAGNOSIS — S2243XA Multiple fractures of ribs, bilateral, initial encounter for closed fracture: Secondary | ICD-10-CM | POA: Diagnosis not present

## 2018-03-17 DIAGNOSIS — M6250 Muscle wasting and atrophy, not elsewhere classified, unspecified site: Secondary | ICD-10-CM | POA: Insufficient documentation

## 2018-03-17 DIAGNOSIS — I700 Atherosclerosis of aorta: Secondary | ICD-10-CM | POA: Diagnosis not present

## 2018-03-17 DIAGNOSIS — R590 Localized enlarged lymph nodes: Secondary | ICD-10-CM | POA: Diagnosis not present

## 2018-03-17 DIAGNOSIS — X58XXXA Exposure to other specified factors, initial encounter: Secondary | ICD-10-CM | POA: Diagnosis not present

## 2018-03-17 DIAGNOSIS — S2241XA Multiple fractures of ribs, right side, initial encounter for closed fracture: Secondary | ICD-10-CM | POA: Diagnosis not present

## 2018-03-17 DIAGNOSIS — C61 Malignant neoplasm of prostate: Secondary | ICD-10-CM | POA: Insufficient documentation

## 2018-03-17 DIAGNOSIS — Z96642 Presence of left artificial hip joint: Secondary | ICD-10-CM | POA: Diagnosis not present

## 2018-03-17 DIAGNOSIS — S2242XA Multiple fractures of ribs, left side, initial encounter for closed fracture: Secondary | ICD-10-CM | POA: Diagnosis not present

## 2018-03-17 MED ORDER — IOPAMIDOL (ISOVUE-300) INJECTION 61%
100.0000 mL | Freq: Once | INTRAVENOUS | Status: AC | PRN
  Administered 2018-03-17: 100 mL via INTRAVENOUS

## 2018-03-17 MED ORDER — TECHNETIUM TC 99M MEDRONATE IV KIT
25.0000 | PACK | Freq: Once | INTRAVENOUS | Status: AC | PRN
  Administered 2018-03-17: 22.22 via INTRAVENOUS

## 2018-03-21 ENCOUNTER — Other Ambulatory Visit: Payer: Self-pay | Admitting: Oncology

## 2018-03-23 ENCOUNTER — Telehealth: Payer: Self-pay | Admitting: Pharmacist

## 2018-03-23 ENCOUNTER — Inpatient Hospital Stay: Payer: PPO | Attending: Oncology | Admitting: Oncology

## 2018-03-23 ENCOUNTER — Encounter: Payer: Self-pay | Admitting: Oncology

## 2018-03-23 ENCOUNTER — Inpatient Hospital Stay: Payer: PPO

## 2018-03-23 VITALS — BP 128/79 | HR 109 | Temp 97.8°F | Resp 18 | Ht 72.84 in | Wt 260.2 lb

## 2018-03-23 DIAGNOSIS — C61 Malignant neoplasm of prostate: Secondary | ICD-10-CM | POA: Diagnosis not present

## 2018-03-23 DIAGNOSIS — C779 Secondary and unspecified malignant neoplasm of lymph node, unspecified: Secondary | ICD-10-CM | POA: Diagnosis not present

## 2018-03-23 DIAGNOSIS — G629 Polyneuropathy, unspecified: Secondary | ICD-10-CM | POA: Diagnosis not present

## 2018-03-23 LAB — PSA: Prostatic Specific Antigen: 19.02 ng/mL — ABNORMAL HIGH (ref 0.00–4.00)

## 2018-03-23 MED ORDER — PREDNISONE 5 MG PO TABS: 5.0000 mg | ORAL_TABLET | Freq: Two times a day (BID) | ORAL | 5 refills | Status: DC

## 2018-03-23 MED ORDER — ABIRATERONE ACETATE 250 MG PO TABS: 1000.0000 mg | ORAL_TABLET | Freq: Every day | ORAL | 0 refills | Status: DC

## 2018-03-23 NOTE — Progress Notes (Signed)
No new changes noted today 

## 2018-03-23 NOTE — Telephone Encounter (Signed)
Oral Oncology Pharmacist Encounter  Received new prescription for Zytiga (abiraterone) for the treatment of metastatic castrate resistant prostate cancer in conjunction with prednisone, planned duration until disease progression or unacceptable drug toxicity.  CMP from 02/28/18 assessed, no relevant lab abnormalities. Prescription dose and frequency assessed.   Current medication list in Epic reviewed, a few DDIs with Zytiga identified: - Zytiga can increase the concentration of phentermine and duloxetine. Patient should be monitored for increase in AEs related to phentermine and duloxetine.  Prescription has been e-scribed to the [facility] for benefits analysis and approval.  Oral Oncology Clinic will continue to follow for insurance authorization, copayment issues, initial counseling and start date.  Darl Pikes, PharmD, BCPS Hematology/Oncology Clinical Pharmacist [facility] [phone]  03/23/2018 4:22 PM

## 2018-03-23 NOTE — Progress Notes (Signed)
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 He had biochemical recurrence in 2017 and was treated with IM RT. He again began to have a rising PSA in 2010 and underwent orchiectomy in 2010.his PSA was being monitored closely PSA a year ago was 0.5, 5 months ago was 3.9 in 2 months ago was 7.8.he underwent CT abdomen and pelvis in May 2018which showed abnormal periaortic and right common iliac lymph nodes. Index aortocaval lymph node 1.4 cm which was new as compared to February 2014 and suspicious for malignancy. Bone scan did not reveal any evidence of bony metastatic disease. He has been referred to [place] for systemic therapy options for CRPC  Patient lives with his wife and is independent of his ADL's. He has A fib for which he is on coumadin. Also has chronic urinary incontinence from prior prostate surgery and has AUS. Reports 50 pound intentional weight loss since October 2017  Repeat PSA in July 2018 was 13.3 indicating PSA doubling time of 3 months  Docetaxel chemo initiated for CRPC with ln mets and rapid doubling PSA in august 2018.Patient completed 5 cycles of docetaxel on 09/27/2017. Cycle #6 was not given due to progressive fatigue and cytopenias as well as worsening neuropathy. Cycle 5 of docetaxel was dose reduced to 60 mg/m square    Interval history- still has some tingling numbness in his extremities. He has not had any falls since December. He is able to carry on with  daily activities such as walking, buttoning his shirt, holding on to objects etc without any impairment. Denies any fatigue, unintentional weight loss or pains anywhere  ECOG PS- 1 Pain scale- 0 Opioid associated constipation- no  Review of systems- Review of Systems  Constitutional: Positive for malaise/fatigue.     Allergies  Allergen Reactions  . Latex Rash    Sensitivity, not allergy.     Past Medical History:  Diagnosis Date  . Arthritis, degenerative 10/30/2015  . Atrial fibrillation ([place]) 10/30/2015  . Bladder spasm 06/12/2013  . Carcinoma of prostate ([place]) 10/30/2015  . Cardiomyopathy ([place]) 09/10/2014   Overview:  EF 35%,4/15   . Cerebrovascular accident (CVA) ([place]) 10/30/2015   Overview:  Left embolic [id number],5009   . Difficult or painful urination 11/21/2013  . Dysrhythmia   . Gout 10/30/2015  . History of kidney stones   . Malignant neoplasm of prostate ([place]) 09/05/2012  . Sinoatrial node dysfunction ([facility]) 10/30/2015   Overview:  DDD pacemaker, 1996      Past Surgical History:  Procedure Laterality Date  . castration  10/2009  . CATARACT EXTRACTION W/ INTRAOCULAR LENS  IMPLANT, BILATERAL Bilateral 2016   right eye done then the left one done 3 months later in 2016  . INGUINAL HERNIA REPAIR Left   . INSERT / REPLACE / REMOVE PACEMAKER    . IR FLUORO GUIDE PORT INSERTION RIGHT  07/01/2017  . JOINT REPLACEMENT Bilateral 1980   hips  . left knee replacement  2014  . PACEMAKER INSERTION    . PROSTATECTOMY    .  REPLACEMENT TOTAL HIP W/  RESURFACING IMPLANTS  1997   bilateral-   . REPLACEMENT TOTAL KNEE Bilateral   . testes removal     per pt   . TONSILLECTOMY AND ADENOIDECTOMY    . TRANSURETHRAL RESECTION OF BLADDER TUMOR N/A 12/31/2015   Procedure: BLADDER BIOPSY;  Surgeon: Nickie Retort, MD;  Location: [facility] ORS;  Service: Urology;  Laterality: N/A;  . Urinary incontinence valve     AMS    Social History   Socioeconomic History  . Marital status:  Married    Spouse name: Not on file  . Number of children: Not on file  . Years of education: Not on file  . Highest education level: Not on file  Occupational History  . Not on file  Social Needs  . Financial resource strain: Not on file  . Food insecurity:    Worry: Not on file    Inability: Not on file  . Transportation needs:    Medical: Not on file    Non-medical: Not on file  Tobacco Use  . Smoking status: Never Smoker  . Smokeless tobacco: Never Used  Substance and Sexual Activity  . Alcohol use: No    Alcohol/week: 0.0 oz  . Drug use: No  . Sexual activity: Never  Lifestyle  . Physical activity:    Days per week: Not on file    Minutes per session: Not on file  . Stress: Not on file  Relationships  . Social connections:    Talks on phone: Not on file    Gets together: Not on file    Attends religious service: Not on file    Active member of club or organization: Not on file    Attends meetings of clubs or organizations: Not on file    Relationship status: Not on file  . Intimate partner violence:    Fear of current or ex partner: Not on file    Emotionally abused: Not on file    Physically abused: Not on file    Forced sexual activity: Not on file  Other Topics Concern  . Not on file  Social History Narrative  . Not on file    Family History  Problem Relation Age of Onset  . Cancer Mother   . Arthritis Mother   . Heart attack Father   . Arthritis Father   . Transient ischemic attack Brother   . Prostate cancer Neg Hx   . Bladder Cancer Neg Hx      Current Outpatient Medications:  .  allopurinol (ZYLOPRIM) 300 MG tablet, Take by mouth daily. In am., Disp: , Rfl:  .  Glucosamine Sulfate 1000 MG CAPS, Take 1 capsule by mouth 2 (two) times daily. , Disp: , Rfl:  .  hydrochlorothiazide (HYDRODIURIL) 25 MG tablet, Take 25 mg by mouth. In am, Disp: , Rfl:  .  lidocaine-prilocaine (EMLA) cream, Apply to affected area once, Disp: 30 g, Rfl: 3 .   phentermine 15 MG capsule, Take 15 mg by mouth every morning., Disp: , Rfl:  .  simvastatin (ZOCOR) 40 MG tablet, Take 40 mg by mouth daily at 6 PM. , Disp: , Rfl:  .  warfarin (COUMADIN) 2 MG tablet, Take 2 mg by mouth once a week. On saturdays., Disp: , Rfl:  .  warfarin (COUMADIN) 5 MG tablet, TAKE ONE (1) TABLET EACH DAY, Disp: , Rfl:  .  abiraterone acetate (ZYTIGA) 250 MG tablet, Take 4 tablets (1,000 mg total) by mouth  daily. Take on an empty stomach 1 hour before or 2 hours after a meal, Disp: 120 tablet, Rfl: 0 .  acetaminophen (TYLENOL) 325 MG tablet, Take 650 mg by mouth 2 (two) times daily., Disp: , Rfl:  .  DULoxetine (CYMBALTA) 30 MG capsule, Take 1 capsule (30 mg total) daily by mouth. (Patient not taking: Reported on 11/15/2017), Disp: 60 capsule, Rfl: 3 .  LORazepam (ATIVAN) 0.5 MG tablet, Take 1 tablet (0.5 mg total) by mouth every 6 (six) hours as needed (Nausea or vomiting). (Patient not taking: Reported on 02/28/2018), Disp: 30 tablet, Rfl: 0 .  ondansetron (ZOFRAN) 8 MG tablet, Take 1 tablet (8 mg total) by mouth 2 (two) times daily as needed for refractory nausea / vomiting. (Patient not taking: Reported on 02/28/2018), Disp: 30 tablet, Rfl: 1 .  predniSONE (DELTASONE) 5 MG tablet, Take 1 tablet (5 mg total) by mouth 2 (two) times daily with a meal., Disp: 60 tablet, Rfl: 5 .  prochlorperazine (COMPAZINE) 10 MG tablet, Take 1 tablet (10 mg total) by mouth every 6 (six) hours as needed (Nausea or vomiting). (Patient not taking: Reported on 02/28/2018), Disp: 30 tablet, Rfl: 1  Current Facility-Administered Medications:  .  lidocaine (XYLOCAINE) 2 % jelly 1 application, 1 application, Urethral, Once, Nickie Retort, MD  Physical exam:  Vitals:   03/23/18 1454  BP: 128/79  Pulse: (!) 109  Resp: 18  Temp: 97.8 F (36.6 C)  TempSrc: Tympanic  SpO2: 97%  Weight: 260 lb 3.2 oz (118 kg)  Height: 6' 0.84" (1.85 m)   Physical Exam  Constitutional: He is oriented to person,  place, and time.  HENT:  Head: Normocephalic and atraumatic.  Eyes: Pupils are equal, round, and reactive to light. EOM are normal.  Neck: Normal range of motion.  Cardiovascular: Normal rate, regular rhythm and normal heart sounds.  Pulmonary/Chest: Effort normal and breath sounds normal.  Abdominal: Soft. Bowel sounds are normal.  Musculoskeletal: He exhibits edema (trace b/l).  Neurological: He is alert and oriented to person, place, and time.  Skin: Skin is warm and dry.     CMP Latest Ref Rng & Units 02/28/2018  Glucose 65 - 99 mg/dL 105(H)  BUN 6 - 20 mg/dL 29(H)  Creatinine 0.61 - 1.24 mg/dL 0.83  Sodium 135 - 145 mmol/L 136  Potassium 3.5 - 5.1 mmol/L 3.5  Chloride 101 - 111 mmol/L 104  CO2 22 - 32 mmol/L 22  Calcium 8.9 - 10.3 mg/dL 9.5  Total Protein 6.5 - 8.1 g/dL 6.9  Total Bilirubin 0.3 - 1.2 mg/dL 1.3(H)  Alkaline Phos 38 - 126 U/L 70  AST 15 - 41 U/L 19  ALT 17 - 63 U/L 9(L)   CBC Latest Ref Rng & Units 02/28/2018  WBC 3.8 - 10.6 K/uL 3.5(L)  Hemoglobin 13.0 - 18.0 g/dL 11.2(L)  Hematocrit 40.0 - 52.0 % 32.9(L)  Platelets 150 - 440 K/uL 127(L)    No images are attached to the encounter.  Ct Chest W Contrast  Result Date: 03/17/2018 CLINICAL DATA:  Prostate cancer restaging. Prior prostatectomy. Rising PSA level. EXAM: CT CHEST, ABDOMEN, AND PELVIS WITH CONTRAST TECHNIQUE: Multidetector CT imaging of the chest, abdomen and pelvis was performed following the standard protocol during bolus administration of intravenous contrast. CONTRAST:  123mL ISOVUE-300 IOPAMIDOL (ISOVUE-300) INJECTION 61% COMPARISON:  Multiple exams, including 11/02/2017 FINDINGS: CT CHEST FINDINGS Cardiovascular: Right Port-A-Cath tip: SVC. Dual lead pacer in place. Coronary, aortic arch, and branch vessel atherosclerotic vascular disease. Mild  cardiomegaly. Mediastinum/Nodes: No thoracic adenopathy observed. Lungs/Pleura: Stable scarring in both lower lobes. Musculoskeletal: New healing displaced  fractures of the right eighth, ninth, tenth, and eleventh ribs. New healing nondisplaced fracture the right anterolateral third rib, and of the right lateral seventh rib. Questionable nondisplaced fractures anteriorly of the right sixth and seventh ribs. New healing fracture of the left anterior third rib with progressive healing response of the prior left anterior fourth rib fracture site. Very late phase healing of the left anterior sixth rib fracture. Minimal progressive vertebral collapse at T12, with 65% loss of height and mild vertebral sclerosis. None of these bony findings have compelling morphologic findings for osseous metastatic disease. Thoracic spondylosis. CT ABDOMEN PELVIS FINDINGS Hepatobiliary: Unremarkable Pancreas: Unremarkable Spleen: Unremarkable Adrenals/Urinary Tract: Unremarkable Stomach/Bowel: Sigmoid colon diverticulosis. Vascular/Lymphatic: Aortoiliac atherosclerotic vascular disease. An aortocaval node measures 1.2 cm in short axis on image 80/2, formerly 0.9 cm. An adjacent aortocaval node measures 1.2 cm on image 78/2, formerly 0.7 cm. A right pericaval node measures 1.0 cm in short axis on image 82/2, formerly 0.6 cm. Right common iliac node 1.2 cm in short axis on image 97/2, formerly 1.0 cm. No pathologic lower pelvic adenopathy is identified; prior pelvic lymph node dissection observed. Reproductive: Prostatectomy. Reservoir or pump in the right groin appears to extend to a artificial urethral sphincter. Other: No supplemental non-categorized findings. Musculoskeletal: Bilateral hip implants are present and introduced streak artifact; metal artifact reduction sequences were employed to counteract this. 5.4 by 2.6 cm bony lucency above the left hip implant probably from particulate disease. There is also a suspected fluid or soft tissue density collection in between the left gluteus maximus and medius muscles measuring 3.9 by 1.9 cm on image 110/2, essentially stable. Again noted is  atrophy of the gluteal musculature and some degree of the hip adductor musculature bilaterally. Left greater than right iloipsoas atrophy. Stable mild asymmetric cortical sclerosis in the right proximal femoral shaft compared to the left, only partially included on today's exam. IMPRESSION: 1. Increased retroperitoneal adenopathy suspicious for metastatic spread of prostate cancer. 2. Multiple new bilateral rib fractures, probably benign. These demonstrate a varying degree of healing response. 3. Lucency above the left hip implant in the acetabulum suspicious for particulate disease. 4. Other imaging findings of potential clinical significance: Aortic Atherosclerosis (ICD10-I70.0). Coronary atherosclerosis with mild cardiomegaly. Minimal increasing compression fracture at T12, about 65% loss of height currently. Chronic pelvic muscular atrophy. Electronically Signed   By: Van Clines M.D.   On: 03/17/2018 15:43   Nm Bone Scan Whole Body  Result Date: 03/17/2018 CLINICAL DATA:  Prostate cancer.  Fall in December 2018 EXAM: NUCLEAR MEDICINE WHOLE BODY BONE SCAN TECHNIQUE: Whole body anterior and posterior images were obtained approximately 3 hours after intravenous injection of radiopharmaceutical. RADIOPHARMACEUTICALS:  22.2 mCi Technetium-53m MDP IV COMPARISON:  11/02/2017 FINDINGS: Bilateral accentuated activity along multiple ribs corresponding to rib fracture locations on the recent chest CT, and not thought to be malignant. Activity at the T12 level, likewise corresponding to the thoracic compression fracture and not thought to be malignant. Photopenic components of bilateral hip and knee implants. Degenerative activity along the shoulders, sternoclavicular joints, and ankles and feet. Activity along the groin felt to be due to urinary incontinence of excreted radiopharmaceutical. IMPRESSION: 1. No findings of active bony malignancy. 2. Accentuated activity in multiple rib fractures and in the T12  compression fracture, which have benign appearances on CT. Electronically Signed   By: Van Clines M.D.   On:  03/17/2018 15:46   Ct Abdomen Pelvis W Contrast  Result Date: 03/17/2018 CLINICAL DATA:  Prostate cancer restaging. Prior prostatectomy. Rising PSA level. EXAM: CT CHEST, ABDOMEN, AND PELVIS WITH CONTRAST TECHNIQUE: Multidetector CT imaging of the chest, abdomen and pelvis was performed following the standard protocol during bolus administration of intravenous contrast. CONTRAST:  130mL ISOVUE-300 IOPAMIDOL (ISOVUE-300) INJECTION 61% COMPARISON:  Multiple exams, including 11/02/2017 FINDINGS: CT CHEST FINDINGS Cardiovascular: Right Port-A-Cath tip: SVC. Dual lead pacer in place. Coronary, aortic arch, and branch vessel atherosclerotic vascular disease. Mild cardiomegaly. Mediastinum/Nodes: No thoracic adenopathy observed. Lungs/Pleura: Stable scarring in both lower lobes. Musculoskeletal: New healing displaced fractures of the right eighth, ninth, tenth, and eleventh ribs. New healing nondisplaced fracture the right anterolateral third rib, and of the right lateral seventh rib. Questionable nondisplaced fractures anteriorly of the right sixth and seventh ribs. New healing fracture of the left anterior third rib with progressive healing response of the prior left anterior fourth rib fracture site. Very late phase healing of the left anterior sixth rib fracture. Minimal progressive vertebral collapse at T12, with 65% loss of height and mild vertebral sclerosis. None of these bony findings have compelling morphologic findings for osseous metastatic disease. Thoracic spondylosis. CT ABDOMEN PELVIS FINDINGS Hepatobiliary: Unremarkable Pancreas: Unremarkable Spleen: Unremarkable Adrenals/Urinary Tract: Unremarkable Stomach/Bowel: Sigmoid colon diverticulosis. Vascular/Lymphatic: Aortoiliac atherosclerotic vascular disease. An aortocaval node measures 1.2 cm in short axis on image 80/2, formerly 0.9 cm.  An adjacent aortocaval node measures 1.2 cm on image 78/2, formerly 0.7 cm. A right pericaval node measures 1.0 cm in short axis on image 82/2, formerly 0.6 cm. Right common iliac node 1.2 cm in short axis on image 97/2, formerly 1.0 cm. No pathologic lower pelvic adenopathy is identified; prior pelvic lymph node dissection observed. Reproductive: Prostatectomy. Reservoir or pump in the right groin appears to extend to a artificial urethral sphincter. Other: No supplemental non-categorized findings. Musculoskeletal: Bilateral hip implants are present and introduced streak artifact; metal artifact reduction sequences were employed to counteract this. 5.4 by 2.6 cm bony lucency above the left hip implant probably from particulate disease. There is also a suspected fluid or soft tissue density collection in between the left gluteus maximus and medius muscles measuring 3.9 by 1.9 cm on image 110/2, essentially stable. Again noted is atrophy of the gluteal musculature and some degree of the hip adductor musculature bilaterally. Left greater than right iloipsoas atrophy. Stable mild asymmetric cortical sclerosis in the right proximal femoral shaft compared to the left, only partially included on today's exam. IMPRESSION: 1. Increased retroperitoneal adenopathy suspicious for metastatic spread of prostate cancer. 2. Multiple new bilateral rib fractures, probably benign. These demonstrate a varying degree of healing response. 3. Lucency above the left hip implant in the acetabulum suspicious for particulate disease. 4. Other imaging findings of potential clinical significance: Aortic Atherosclerosis (ICD10-I70.0). Coronary atherosclerosis with mild cardiomegaly. Minimal increasing compression fracture at T12, about 65% loss of height currently. Chronic pelvic muscular atrophy. Electronically Signed   By: Van Clines M.D.   On: 03/17/2018 15:43     Assessment and plan- Patient is a 72 y.o. male with castrate  resistant metastatic prostate cancer with lymph node metastases status post 5 cycles of docetaxel  I have personally reviewed ct chest abdomen images and bone scan independently. I have also discussed his case at tumor board. His PSA continues to rise and is 19 today. He is castrate resistant as he is s/p bilateral orchiectomy. He has non bulky intra adbominal adenopathy  which is worse on CT abdomen. No evidence of metastases elsewhere. He has sustained multiple rib fractures after fall in December which are healing. I will plan to start zytiga 1000 mg daily along with prednisone 5 mg BID for CRPC that has progressed after docetaxel.  I would have liked to rebiopsy his LN for NGS testing. However these LN are not easily amenable to biopsy. I will have to consider that down the line  Discussed risks and benefits of zytiga including all but not limited to hypertension, leg swelling, fatigue and abnormal LFT's. Patient understands and agrees to proceed as planned.he will be meeting oral pharmacist Allyson as well to go over side efefcts of the medication   Repeat cbc/ cmp in 3 weeks and 5 weeks and I will see him in 5 weeks with psa as well    Visit Diagnosis 1. Malignant neoplasm of prostate ([place])   2. Regional lymph node metastasis present [facility])      Dr. Randa Evens, MD, MPH [facility] at [facility] [phone] 03/27/2018 8:38 AM

## 2018-03-23 NOTE — Telephone Encounter (Signed)
Oral Chemotherapy Pharmacist Encounter  Patient Education I spoke with patient following his OV for overview of new oral chemotherapy medication: Zytiga (abiraterone) for the treatment of metastatic castrate resistant prostate cancer in conjunction with prednisone, planned duration until disease progression or unacceptable drug toxicity.  Pt is doing well. Counseled patient on administration, dosing, side effects, monitoring, drug-food interactions, safe handling, storage, and disposal. Patient will take Zytiga 4 tablets (1,000 mg total) by mouth daily. Take on an empty stomach 1 hour before or 2 hours after a meal. Along with prednisone 1 tablet (5 mg total) by mouth 2 (two) times daily with a meal.  Side effects include but not limited to: fatigue, decreased WBC, liver damage.    Reviewed with patient importance of keeping a medication schedule and plan for any missed doses.  Jeremy Carney voiced understanding and appreciation. All questions answered. Patient provided with medication handout.   Provided patient with [facility] phone number. Patient knows to call the office with questions or concerns. Oral Chemotherapy [facility] will continue to follow.  Darl Pikes, PharmD, BCPS Hematology/Oncology Clinical Pharmacist [facility] [phone]  03/23/2018 4:39 PM

## 2018-03-24 ENCOUNTER — Telehealth: Payer: Self-pay | Admitting: Pharmacy Technician

## 2018-03-24 LAB — TESTOSTERONE

## 2018-03-24 NOTE — Telephone Encounter (Signed)
Oral Oncology Patient Advocate Encounter  Received notification from [employer] Medicare Part D that prior authorization for Zytiga is required.  PA submitted on CoverMyMeds [facility] Status is pending  Oral Oncology Clinic will continue to follow.  Fabio Asa. Melynda Keller, [place] [facility] [phone] 03/24/2018 10:17 AM

## 2018-03-27 MED FILL — predniSONE 5 MG TABS: 5 | 30 days supply | Qty: 60 | Fill #0

## 2018-03-27 MED FILL — ABIRATERONE ACETATE 250 MG: 250 | 30 days supply | Qty: 120 | Fill #0

## 2018-03-27 NOTE — Telephone Encounter (Signed)
Oral Chemotherapy Pharmacist Encounter  Successfully enrolled patient for copayment assistance funds from [place] from the Prostate Cancer fund.  Award amount: $4,250 Effective dates: 03/27/18 - 03/28/19 ID: [id number] BIN: [id number] Group: [id number] PCN: [id number]  Billing information will be shared with [place]. I will place a copy of the award letter to be scanned into patient's chart.  Darl Pikes, PharmD, BCPS Hematology/Oncology Clinical Pharmacist [facility] [phone]  03/27/2018 9:11 AM

## 2018-03-27 NOTE — Telephone Encounter (Signed)
Oral Chemotherapy Pharmacist Encounter   Called patient to inform him of Cancer [facility] copay assistance. Also set patient up to fill his medication at [facility].  Reviewed administration with Mr. Wale. He will receive his medication by 03/29/18  Darl Pikes, PharmD, BCPS Hematology/Oncology Clinical Pharmacist [facility] [phone]  03/27/2018 11:33 AM

## 2018-03-27 NOTE — Telephone Encounter (Signed)
Oral Chemotherapy Pharmacist Encounter   PA appoved for Zytiga Approval dates: 03/24/18 - 12/12/18  I will place a copy of the approval letter to be scanned into patient's chart.  Darl Pikes, PharmD, BCPS Hematology/Oncology Clinical Pharmacist [facility] [phone]  03/27/2018 9:05 AM

## 2018-03-28 ENCOUNTER — Telehealth: Payer: Self-pay | Admitting: Pharmacist

## 2018-03-28 DIAGNOSIS — I480 Paroxysmal atrial fibrillation: Secondary | ICD-10-CM | POA: Diagnosis not present

## 2018-03-28 NOTE — Telephone Encounter (Signed)
Oral Chemotherapy Pharmacist Encounter  Successfully enrolled patient for copayment assistance funds from [facility] [place] (PAF) from the metastatic prostate fund.  Award amount: $6,500 Effective dates: 09/29/17 - 03/29/19  Cardholder Number:[id number] BIN: [id number] PCN: [id number] Group: [id number]  Billing information will be shared with [place]. I will place a copy of the award letter to be scanned into patient's chart.  Darl Pikes, PharmD, BCPS Hematology/Oncology Clinical Pharmacist [facility] [phone]  03/29/2018 8:35 AM

## 2018-03-29 ENCOUNTER — Telehealth: Payer: Self-pay | Admitting: Pharmacist

## 2018-03-29 NOTE — Telephone Encounter (Signed)
Oral Chemotherapy Pharmacist Encounter   Patient called to let me know he received his medication and will get started taking his medication. Reviewed with patient how he is to take his Zytiga.  Jeremy Carney had some questions about DDI between Zytiga and phentermine (which he takes for weight lose). I explained to the patient that Zytiga can increase the concentration of phentermine and for now we are just going to monitor the interaction. He should look for trouble sleeping and dry mouth.  Also inform Mr. Both of the new PAF grant.  Darl Pikes, PharmD, BCPS Hematology/Oncology Clinical Pharmacist [facility] [phone]  03/29/2018 10:23 AM

## 2018-04-03 ENCOUNTER — Telehealth: Payer: Self-pay | Admitting: Oncology

## 2018-04-03 DIAGNOSIS — I482 Chronic atrial fibrillation: Secondary | ICD-10-CM | POA: Diagnosis not present

## 2018-04-03 NOTE — Telephone Encounter (Signed)
Patient walked in to cancel Lab appt schd for 04/13/18. Cancel per patient request. He will be out of town for 3 weeks, beginning today.

## 2018-04-04 NOTE — Telephone Encounter (Signed)
Oral Chemotherapy Pharmacist Encounter   Diagnosis Verification Form was received and approved by PAF. This was the last step needed to secure PAF funding for Jeremy Carney.  Darl Pikes, PharmD, BCPS Hematology/Oncology Clinical Pharmacist [facility] [phone]  04/04/2018 2:27 PM

## 2018-04-13 ENCOUNTER — Other Ambulatory Visit: Payer: PPO

## 2018-04-17 ENCOUNTER — Other Ambulatory Visit: Payer: Self-pay | Admitting: Oncology

## 2018-04-17 DIAGNOSIS — C61 Malignant neoplasm of prostate: Secondary | ICD-10-CM

## 2018-04-20 MED FILL — ABIRATERONE ACETATE 250 MG: 250 | 30 days supply | Qty: 120 | Fill #0

## 2018-04-24 ENCOUNTER — Telehealth: Payer: Self-pay | Admitting: *Deleted

## 2018-04-24 NOTE — Telephone Encounter (Signed)
Patient called to advise that he will be here for his appt. Tomorrow.

## 2018-04-25 ENCOUNTER — Encounter: Payer: Self-pay | Admitting: Oncology

## 2018-04-25 ENCOUNTER — Inpatient Hospital Stay: Payer: PPO | Attending: Oncology | Admitting: Oncology

## 2018-04-25 ENCOUNTER — Inpatient Hospital Stay: Payer: PPO

## 2018-04-25 VITALS — BP 132/88 | HR 111 | Temp 97.8°F | Resp 18 | Ht 72.84 in | Wt 269.0 lb

## 2018-04-25 DIAGNOSIS — C779 Secondary and unspecified malignant neoplasm of lymph node, unspecified: Secondary | ICD-10-CM | POA: Diagnosis not present

## 2018-04-25 DIAGNOSIS — C61 Malignant neoplasm of prostate: Secondary | ICD-10-CM | POA: Diagnosis not present

## 2018-04-25 DIAGNOSIS — Z7901 Long term (current) use of anticoagulants: Secondary | ICD-10-CM | POA: Insufficient documentation

## 2018-04-25 DIAGNOSIS — Z79899 Other long term (current) drug therapy: Secondary | ICD-10-CM | POA: Diagnosis not present

## 2018-04-25 DIAGNOSIS — I4891 Unspecified atrial fibrillation: Secondary | ICD-10-CM | POA: Insufficient documentation

## 2018-04-25 LAB — CBC WITH DIFFERENTIAL/PLATELET
BASOS PCT: 0 %
Basophils Absolute: 0 10*3/uL (ref 0–0.1)
EOS ABS: 0.1 10*3/uL (ref 0–0.7)
EOS PCT: 2 %
HCT: 33.4 % — ABNORMAL LOW (ref 40.0–52.0)
Hemoglobin: 11.5 g/dL — ABNORMAL LOW (ref 13.0–18.0)
Lymphocytes Relative: 18 %
Lymphs Abs: 0.8 10*3/uL — ABNORMAL LOW (ref 1.0–3.6)
MCH: 29.9 pg (ref 26.0–34.0)
MCHC: 34.4 g/dL (ref 32.0–36.0)
MCV: 86.9 fL (ref 80.0–100.0)
MONO ABS: 0.3 10*3/uL (ref 0.2–1.0)
MONOS PCT: 7 %
NEUTROS PCT: 73 %
Neutro Abs: 3.3 10*3/uL (ref 1.4–6.5)
PLATELETS: 106 10*3/uL — AB (ref 150–440)
RBC: 3.84 MIL/uL — ABNORMAL LOW (ref 4.40–5.90)
RDW: 16.3 % — AB (ref 11.5–14.5)
WBC: 4.6 10*3/uL (ref 3.8–10.6)

## 2018-04-25 LAB — COMPREHENSIVE METABOLIC PANEL
ALK PHOS: 73 U/L (ref 38–126)
ALT: 10 U/L — AB (ref 17–63)
AST: 18 U/L (ref 15–41)
Albumin: 4.2 g/dL (ref 3.5–5.0)
Anion gap: 9 (ref 5–15)
BUN: 27 mg/dL — AB (ref 6–20)
CALCIUM: 9.3 mg/dL (ref 8.9–10.3)
CO2: 25 mmol/L (ref 22–32)
CREATININE: 1.05 mg/dL (ref 0.61–1.24)
Chloride: 104 mmol/L (ref 101–111)
GFR calc non Af Amer: >60 mL/min (ref >60)
GLUCOSE: 119 mg/dL — AB (ref 65–99)
Potassium: 3.5 mmol/L (ref 3.5–5.1)
SODIUM: 138 mmol/L (ref 135–145)
Total Bilirubin: 1.6 mg/dL — ABNORMAL HIGH (ref 0.3–1.2)
Total Protein: 6.6 g/dL (ref 6.5–8.1)

## 2018-04-25 LAB — PSA: Prostatic Specific Antigen: 16.46 ng/mL — ABNORMAL HIGH (ref 0.00–4.00)

## 2018-04-25 MED ORDER — SODIUM CHLORIDE 0.9% FLUSH
10.0000 mL | Freq: Once | INTRAVENOUS | Status: AC
  Administered 2018-04-25: 10 mL via INTRAVENOUS
  Filled 2018-04-25: qty 10

## 2018-04-25 MED ORDER — HEPARIN SOD (PORK) LOCK FLUSH 100 UNIT/ML IV SOLN
500.0000 [IU] | Freq: Once | INTRAVENOUS | Status: AC
  Administered 2018-04-25: 500 [IU] via INTRAVENOUS

## 2018-04-25 NOTE — Progress Notes (Signed)
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 He had biochemical recurrence in 2017 and was treated with IM RT. He again began to have a rising PSA in 2010 and underwent orchiectomy in 2010.his PSA was being monitored closely PSA a year ago was 0.5, 5 months ago was 3.9 in 2 months ago was 7.8.he underwent CT abdomen and pelvis in May 2018which showed abnormal periaortic and right common iliac lymph nodes. Index aortocaval lymph node 1.4 cm which was new as compared to February 2014 and suspicious for malignancy. Bone scan did not reveal any evidence of bony metastatic disease. He has been referred to [place] for systemic therapy options for CRPC  Patient lives with his wife and is independent of his ADL's. He has A fib for which he is on coumadin. Also has chronic urinary incontinence from prior prostate surgery and has AUS. Reports 50 pound intentional weight loss since October 2017  Repeat PSA in July 2018 was 13.3 indicating PSA doubling time of 3 months  Docetaxel chemo initiated for CRPC with ln mets and rapid doubling PSA in august 2018.Patient completed 5 cycles of docetaxel on 09/27/2017. Cycle #6 was not given due to progressive fatigue and cytopenias as well as worsening neuropathy. Cycle 5 of docetaxel was dose reduced to 60 mg/m square  In April 2019 patient noted to have rising PSA and increase in the size of retroperitoneal LN. Patient therefore started second  line zytiga  Interval history- he reports taking medications daily. Denies any side effects. No recent falls.   ECOG PS- 1 Pain scale- 0  Review of systems- Review of Systems  Constitutional: Negative for chills, fever, malaise/fatigue and weight loss.  HENT: Negative for congestion, ear discharge and nosebleeds.   Eyes: Negative for blurred vision.  Respiratory: Negative for cough, hemoptysis, sputum production, shortness of breath and wheezing.   Cardiovascular: Negative for chest pain, palpitations, orthopnea and claudication.  Gastrointestinal: Negative for abdominal pain, blood in stool, constipation, diarrhea, heartburn, melena, nausea and vomiting.  Genitourinary: Negative for dysuria, flank pain, frequency, hematuria and urgency.  Musculoskeletal: Negative for back pain, joint pain and myalgias.  Skin: Negative for rash.  Neurological: Negative for dizziness, tingling, focal weakness, seizures, weakness and headaches.  Endo/Heme/Allergies: Does not bruise/bleed easily.  Psychiatric/Behavioral: Negative for depression and suicidal ideas. The patient does not have insomnia.       Allergies  Allergen Reactions  . Latex Rash    Sensitivity, not allergy.     Past Medical History:  Diagnosis Date  . Arthritis, degenerative 10/30/2015  . Atrial fibrillation ([place]) 10/30/2015  . Bladder spasm 06/12/2013  . Carcinoma of prostate ([place]) 10/30/2015  . Cardiomyopathy ([place]) 09/10/2014   Overview:  EF 35%,4/15   . Cerebrovascular accident (CVA) ([place]) 10/30/2015   Overview:  Left embolic [id number]   . Difficult or painful urination 11/21/2013  . Dysrhythmia   . Gout 10/30/2015  . History of kidney stones   . Malignant neoplasm of prostate ([place]) 09/05/2012  . Sinoatrial  node dysfunction ([place]) 10/30/2015   Overview:  DDD pacemaker, 1996      Past Surgical History:  Procedure Laterality Date  . castration  10/2009  . CATARACT EXTRACTION W/ INTRAOCULAR LENS  IMPLANT, BILATERAL  Bilateral 2016   right eye done then the left one done 3 months later in 2016  . INGUINAL HERNIA REPAIR Left   . INSERT / REPLACE / REMOVE PACEMAKER    . IR FLUORO GUIDE PORT INSERTION RIGHT  07/01/2017  . JOINT REPLACEMENT Bilateral 1980   hips  . left knee replacement  2014  . PACEMAKER INSERTION    . PROSTATECTOMY    . REPLACEMENT TOTAL HIP W/  RESURFACING IMPLANTS  1997   bilateral-   . REPLACEMENT TOTAL KNEE Bilateral   . testes removal     per pt   . TONSILLECTOMY AND ADENOIDECTOMY    . TRANSURETHRAL RESECTION OF BLADDER TUMOR N/A 12/31/2015   Procedure: BLADDER BIOPSY;  Surgeon: Nickie Retort, MD;  Location: [facility] ORS;  Service: Urology;  Laterality: N/A;  . Urinary incontinence valve     AMS    Social History   Socioeconomic History  . Marital status: Married    Spouse name: Not on file  . Number of children: Not on file  . Years of education: Not on file  . Highest education level: Not on file  Occupational History  . Not on file  Social Needs  . Financial resource strain: Not on file  . Food insecurity:    Worry: Not on file    Inability: Not on file  . Transportation needs:    Medical: Not on file    Non-medical: Not on file  Tobacco Use  . Smoking status: Never Smoker  . Smokeless tobacco: Never Used  Substance and Sexual Activity  . Alcohol use: No    Alcohol/week: 0.0 oz  . Drug use: No  . Sexual activity: Never  Lifestyle  . Physical activity:    Days per week: Not on file    Minutes per session: Not on file  . Stress: Not on file  Relationships  . Social connections:    Talks on phone: Not on file    Gets together: Not on file    Attends religious service: Not on file    Active member of club or organization: Not on file    Attends meetings of clubs or organizations: Not on file    Relationship status: Not on file  . Intimate partner violence:    Fear of current or ex partner: Not on file    Emotionally abused: Not on file     Physically abused: Not on file    Forced sexual activity: Not on file  Other Topics Concern  . Not on file  Social History Narrative  . Not on file    Family History  Problem Relation Age of Onset  . Cancer Mother   . Arthritis Mother   . Heart attack Father   . Arthritis Father   . Transient ischemic attack Brother   . Prostate cancer Neg Hx   . Bladder Cancer Neg Hx      Current Outpatient Medications:  .  abiraterone acetate (ZYTIGA) 250 MG tablet, TAKE 4 TABLETS (1,000 MG TOTAL) BY MOUTH DAILY. TAKE ON AN EMPTY STOMACH 1 HOUR BEFORE OR 2 HOURS AFTER A MEAL, Disp: 120 tablet, Rfl: 0 .  acetaminophen (TYLENOL) 325 MG tablet, Take 650 mg by mouth 2 (two) times daily.,  Disp: , Rfl:  .  allopurinol (ZYLOPRIM) 300 MG tablet, Take by mouth daily. In am., Disp: , Rfl:  .  Glucosamine Sulfate 1000 MG CAPS, Take 1 capsule by mouth 2 (two) times daily. , Disp: , Rfl:  .  hydrochlorothiazide (HYDRODIURIL) 25 MG tablet, Take 25 mg by mouth. In am, Disp: , Rfl:  .  phentermine 15 MG capsule, Take 15 mg by mouth every morning., Disp: , Rfl:  .  predniSONE (DELTASONE) 5 MG tablet, Take 1 tablet (5 mg total) by mouth 2 (two) times daily with a meal., Disp: 60 tablet, Rfl: 5 .  warfarin (COUMADIN) 2 MG tablet, Take 2 mg by mouth once a week. On saturdays., Disp: , Rfl:  .  warfarin (COUMADIN) 5 MG tablet, TAKE ONE (1) TABLET EACH DAY, Disp: , Rfl:  .  DULoxetine (CYMBALTA) 30 MG capsule, Take 1 capsule (30 mg total) daily by mouth. (Patient not taking: Reported on 04/25/2018), Disp: 60 capsule, Rfl: 3 .  lidocaine-prilocaine (EMLA) cream, Apply to affected area once (Patient not taking: Reported on 04/25/2018), Disp: 30 g, Rfl: 3 .  LORazepam (ATIVAN) 0.5 MG tablet, Take 1 tablet (0.5 mg total) by mouth every 6 (six) hours as needed (Nausea or vomiting). (Patient not taking: Reported on 02/28/2018), Disp: 30 tablet, Rfl: 0 .  ondansetron (ZOFRAN) 8 MG tablet, Take 1 tablet (8 mg total) by mouth 2  (two) times daily as needed for refractory nausea / vomiting. (Patient not taking: Reported on 02/28/2018), Disp: 30 tablet, Rfl: 1 .  prochlorperazine (COMPAZINE) 10 MG tablet, Take 1 tablet (10 mg total) by mouth every 6 (six) hours as needed (Nausea or vomiting). (Patient not taking: Reported on 02/28/2018), Disp: 30 tablet, Rfl: 1 .  simvastatin (ZOCOR) 40 MG tablet, Take 40 mg by mouth daily at 6 PM. , Disp: , Rfl:   Current Facility-Administered Medications:  .  lidocaine (XYLOCAINE) 2 % jelly 1 application, 1 application, Urethral, Once, Nickie Retort, MD  Physical exam:  Vitals:   04/25/18 1424  BP: 132/88  Pulse: (!) 111  Resp: 18  Temp: 97.8 F (36.6 C)  SpO2: 96%  Weight: 269 lb (122 kg)  Height: 6' 0.84" (1.85 m)   Physical Exam  Constitutional: He is oriented to person, place, and time.  Patient is obese. Does not appear to be in acute distress  HENT:  Head: Normocephalic and atraumatic.  Eyes: Pupils are equal, round, and reactive to light. EOM are normal.  Neck: Normal range of motion.  Cardiovascular: Normal rate, regular rhythm and normal heart sounds.  Pulmonary/Chest: Effort normal and breath sounds normal.  Abdominal: Soft. Bowel sounds are normal.  Musculoskeletal: He exhibits edema (trace b/l).  Neurological: He is alert and oriented to person, place, and time.  Skin: Skin is warm and dry.     CMP Latest Ref Rng & Units 04/25/2018  Glucose 65 - 99 mg/dL 119(H)  BUN 6 - 20 mg/dL 27(H)  Creatinine 0.61 - 1.24 mg/dL 1.05  Sodium 135 - 145 mmol/L 138  Potassium 3.5 - 5.1 mmol/L 3.5  Chloride 101 - 111 mmol/L 104  CO2 22 - 32 mmol/L 25  Calcium 8.9 - 10.3 mg/dL 9.3  Total Protein 6.5 - 8.1 g/dL 6.6  Total Bilirubin 0.3 - 1.2 mg/dL 1.6(H)  Alkaline Phos 38 - 126 U/L 73  AST 15 - 41 U/L 18  ALT 17 - 63 U/L 10(L)   CBC Latest Ref Rng & Units 04/25/2018  WBC  3.8 - 10.6 K/uL 4.6  Hemoglobin 13.0 - 18.0 g/dL 11.5(L)  Hematocrit 40.0 - 52.0 % 33.4(L)    Platelets 150 - 440 K/uL 106(L)    No images are attached to the encounter.  No results found.   Assessment and plan- Patient is a 72 y.o. male with castrate resistant prostate cancer with LN mets and progression on docetaxel now on second line zytiga  Overall patient is tolerating zytiga well. PSA down from 19 to 16. Mild increase in toltal bili. Normal AST/ ALT. We will continue to monitor. He will continue zytiga and prednisone at this time. I will see him back on 05/22/18 in [place] for routine f/u. Check cbc/ cmp and pSA on that day   Visit Diagnosis 1. Malignant neoplasm of prostate ([place])   2. Regional lymph node metastasis present ([place])   3. High risk medication use      Dr. Randa Evens, MD, MPH [facility] [phone] 04/25/2018 10:02 PM

## 2018-04-25 NOTE — Progress Notes (Signed)
No new changes noted today 

## 2018-04-27 DIAGNOSIS — E782 Mixed hyperlipidemia: Secondary | ICD-10-CM | POA: Diagnosis not present

## 2018-04-27 DIAGNOSIS — I482 Chronic atrial fibrillation: Secondary | ICD-10-CM | POA: Diagnosis not present

## 2018-05-04 DIAGNOSIS — E782 Mixed hyperlipidemia: Secondary | ICD-10-CM | POA: Diagnosis not present

## 2018-05-04 DIAGNOSIS — I6300 Cerebral infarction due to thrombosis of unspecified precerebral artery: Secondary | ICD-10-CM | POA: Diagnosis not present

## 2018-05-04 DIAGNOSIS — Z0000 Encounter for general adult medical examination without abnormal findings: Secondary | ICD-10-CM | POA: Diagnosis not present

## 2018-05-04 DIAGNOSIS — C61 Malignant neoplasm of prostate: Secondary | ICD-10-CM | POA: Diagnosis not present

## 2018-05-04 DIAGNOSIS — M1A00X0 Idiopathic chronic gout, unspecified site, without tophus (tophi): Secondary | ICD-10-CM | POA: Insufficient documentation

## 2018-05-04 DIAGNOSIS — E538 Deficiency of other specified B group vitamins: Secondary | ICD-10-CM | POA: Diagnosis not present

## 2018-05-04 DIAGNOSIS — I420 Dilated cardiomyopathy: Secondary | ICD-10-CM | POA: Diagnosis not present

## 2018-05-04 DIAGNOSIS — I482 Chronic atrial fibrillation: Secondary | ICD-10-CM | POA: Diagnosis not present

## 2018-05-11 ENCOUNTER — Other Ambulatory Visit: Payer: Self-pay | Admitting: Oncology

## 2018-05-11 ENCOUNTER — Telehealth: Payer: Self-pay | Admitting: *Deleted

## 2018-05-11 DIAGNOSIS — C61 Malignant neoplasm of prostate: Secondary | ICD-10-CM

## 2018-05-11 MED FILL — predniSONE 5 MG TABS: 5 | 30 days supply | Qty: 60 | Fill #1

## 2018-05-11 NOTE — Telephone Encounter (Signed)
Patient called and has questions related to his medications and his oral chemotherapy. Please return his call [phone]

## 2018-05-11 NOTE — Telephone Encounter (Signed)
Oral Chemotherapy Pharmacist Encounter   Patient called me back, he had question regarding how to take his prednisone. Reviewed with him dosing and administration. He stated his understanding.   Darl Pikes, PharmD, BCPS Hematology/Oncology Clinical Pharmacist [facility] [phone]  05/11/2018 3:22 PM

## 2018-05-11 NOTE — Telephone Encounter (Signed)
Oral Chemotherapy Pharmacist Encounter   Attempted to reach patient for follow up on his oral medication question. No answer. Left VM for patient to call back.    Darl Pikes, PharmD, BCPS Hematology/Oncology Clinical Pharmacist [facility] [place] [facility] [phone]  05/11/2018 2:17 PM

## 2018-05-17 MED FILL — ABIRATERONE ACETATE 250 MG: 250 | 30 days supply | Qty: 120 | Fill #0

## 2018-05-22 ENCOUNTER — Inpatient Hospital Stay: Payer: PPO

## 2018-05-22 ENCOUNTER — Encounter: Payer: Self-pay | Admitting: Oncology

## 2018-05-22 ENCOUNTER — Inpatient Hospital Stay: Payer: PPO | Attending: Oncology | Admitting: Oncology

## 2018-05-22 VITALS — BP 128/91 | HR 57 | Temp 97.4°F | Resp 18 | Ht 72.84 in | Wt 251.5 lb

## 2018-05-22 DIAGNOSIS — C779 Secondary and unspecified malignant neoplasm of lymph node, unspecified: Secondary | ICD-10-CM

## 2018-05-22 DIAGNOSIS — Z79899 Other long term (current) drug therapy: Secondary | ICD-10-CM | POA: Diagnosis not present

## 2018-05-22 DIAGNOSIS — Z7901 Long term (current) use of anticoagulants: Secondary | ICD-10-CM | POA: Insufficient documentation

## 2018-05-22 DIAGNOSIS — C61 Malignant neoplasm of prostate: Secondary | ICD-10-CM | POA: Diagnosis not present

## 2018-05-22 DIAGNOSIS — I4891 Unspecified atrial fibrillation: Secondary | ICD-10-CM | POA: Diagnosis not present

## 2018-05-22 DIAGNOSIS — G620 Drug-induced polyneuropathy: Secondary | ICD-10-CM | POA: Insufficient documentation

## 2018-05-22 LAB — COMPREHENSIVE METABOLIC PANEL
ALT: 11 U/L — ABNORMAL LOW (ref 17–63)
ANION GAP: 12 (ref 5–15)
AST: 22 U/L (ref 15–41)
Albumin: 4.8 g/dL (ref 3.5–5.0)
Alkaline Phosphatase: 82 U/L (ref 38–126)
BILIRUBIN TOTAL: 1.5 mg/dL — AB (ref 0.3–1.2)
BUN: 41 mg/dL — ABNORMAL HIGH (ref 6–20)
CHLORIDE: 100 mmol/L — AB (ref 101–111)
CO2: 26 mmol/L (ref 22–32)
Calcium: 9.8 mg/dL (ref 8.9–10.3)
Creatinine, Ser: 1.11 mg/dL (ref 0.61–1.24)
Glucose, Bld: 110 mg/dL — ABNORMAL HIGH (ref 65–99)
POTASSIUM: 4.3 mmol/L (ref 3.5–5.1)
Sodium: 138 mmol/L (ref 135–145)
TOTAL PROTEIN: 7.4 g/dL (ref 6.5–8.1)

## 2018-05-22 LAB — CBC WITH DIFFERENTIAL/PLATELET
BASOS ABS: 0 10*3/uL (ref 0–0.1)
Basophils Relative: 1 %
Eosinophils Absolute: 0 10*3/uL (ref 0–0.7)
Eosinophils Relative: 1 %
HEMATOCRIT: 38.5 % — AB (ref 40.0–52.0)
HEMOGLOBIN: 13.1 g/dL (ref 13.0–18.0)
Lymphocytes Relative: 24 %
Lymphs Abs: 1.2 10*3/uL (ref 1.0–3.6)
MCH: 30 pg (ref 26.0–34.0)
MCHC: 34.1 g/dL (ref 32.0–36.0)
MCV: 87.9 fL (ref 80.0–100.0)
Monocytes Absolute: 0.4 10*3/uL (ref 0.2–1.0)
Monocytes Relative: 8 %
NEUTROS ABS: 3.5 10*3/uL (ref 1.4–6.5)
NEUTROS PCT: 66 %
Platelets: 136 10*3/uL — ABNORMAL LOW (ref 150–440)
RBC: 4.38 MIL/uL — ABNORMAL LOW (ref 4.40–5.90)
RDW: 16.6 % — ABNORMAL HIGH (ref 11.5–14.5)
WBC: 5.2 10*3/uL (ref 3.8–10.6)

## 2018-05-22 LAB — PSA: PROSTATIC SPECIFIC ANTIGEN: 16.61 ng/mL — AB (ref 0.00–4.00)

## 2018-05-22 MED ORDER — LIDOCAINE-PRILOCAINE 2.5-2.5 % EX CREA: TOPICAL_CREAM | CUTANEOUS | 3 refills | Status: DC

## 2018-05-22 MED FILL — predniSONE 5 MG TABS: 5 | 30 days supply | Qty: 60 | Fill #2

## 2018-05-22 NOTE — Progress Notes (Signed)
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 He had biochemical recurrence in 2017 and was treated with IM RT. He again began to have a rising PSA in 2010 and underwent orchiectomy in 2010.his PSA was being monitored closely PSA a year ago was 0.5, 5 months ago was 3.9 in 2 months ago was 7.8.he underwent CT abdomen and pelvis in May 2018which showed abnormal periaortic and right common iliac lymph nodes. Index aortocaval lymph node 1.4 cm which was new as compared to February 2014 and suspicious for malignancy. Bone scan did not reveal any evidence of bony metastatic disease. He has been referred to [place] for systemic therapy options for CRPC  Patient lives with his wife and is independent of his ADL's. He has A fib for which he is on coumadin. Also has chronic urinary incontinence from prior prostate surgery and has AUS. Reports 50 pound intentional weight loss since October 2017  Repeat PSA in July 2018 was 13.3 indicating PSA doubling time of 3 months  Docetaxel chemo initiated for CRPC with ln mets and rapid doubling PSA in august 2018.Patient completed 5 cycles of docetaxel on 09/27/2017. Cycle #6 was not given due to progressive fatigue and cytopenias as well as worsening neuropathy. Cycle 5 of docetaxel was dose reduced to 60 mg/m square  In April 2019 patient noted to have rising PSA and increase in the size of retroperitoneal LN. Patient therefore started second line  zytiga   Interval history- Patient is toleratign zytiga well without any significant side effects. 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 No new symptoms  ECOG PS- 1 Pain scale- 0   Review of systems- Review of Systems  Constitutional: Negative for chills, fever, malaise/fatigue and weight loss.  HENT: Negative for congestion, ear discharge and nosebleeds.   Eyes: Negative for blurred vision.  Respiratory: Negative for cough, hemoptysis, sputum production, shortness of breath and wheezing.   Cardiovascular: Negative for chest pain, palpitations, orthopnea and claudication.  Gastrointestinal: Negative for abdominal pain, blood in stool, constipation, diarrhea, heartburn, melena, nausea and vomiting.  Genitourinary: Negative for dysuria, flank pain, frequency, hematuria and urgency.  Musculoskeletal: Negative for back pain, joint pain and myalgias.  Skin: Negative for rash.  Neurological: Negative for dizziness, tingling, focal weakness, seizures, weakness and headaches.  Endo/Heme/Allergies: Does not bruise/bleed easily.  Psychiatric/Behavioral: Negative for depression and suicidal ideas. The patient does not have insomnia.      Allergies  Allergen Reactions  . Latex Rash    Sensitivity, not allergy.     Past Medical History:  Diagnosis Date  . Arthritis, degenerative 10/30/2015  . Atrial fibrillation ([place]) 10/30/2015  . Bladder spasm 06/12/2013  . Carcinoma of prostate ([place]) 10/30/2015  . Cardiomyopathy ([place]) 09/10/2014   Overview:  EF 35%,4/15   . Cerebrovascular accident (CVA) ([place]) 10/30/2015   Overview:  Left embolic [id number]   . Difficult or painful urination 11/21/2013  . Dysrhythmia   . Gout 10/30/2015  . History of kidney stones   .  Malignant neoplasm of prostate ([place]) 09/05/2012  . Sinoatrial node dysfunction ([facility]) 10/30/2015   Overview:  DDD pacemaker, 1996      Past Surgical History:  Procedure Laterality Date  . castration   10/2009  . CATARACT EXTRACTION W/ INTRAOCULAR LENS  IMPLANT, BILATERAL Bilateral 2016   right eye done then the left one done 3 months later in 2016  . INGUINAL HERNIA REPAIR Left   . INSERT / REPLACE / REMOVE PACEMAKER    . IR FLUORO GUIDE PORT INSERTION RIGHT  07/01/2017  . JOINT REPLACEMENT Bilateral 1980   hips  . left knee replacement  2014  . PACEMAKER INSERTION    . PROSTATECTOMY    . REPLACEMENT TOTAL HIP W/  RESURFACING IMPLANTS  1997   bilateral-   . REPLACEMENT TOTAL KNEE Bilateral   . testes removal     per pt   . TONSILLECTOMY AND ADENOIDECTOMY    . TRANSURETHRAL RESECTION OF BLADDER TUMOR N/A 12/31/2015   Procedure: BLADDER BIOPSY;  Surgeon: Nickie Retort, MD;  Location: [facility] ORS;  Service: Urology;  Laterality: N/A;  . Urinary incontinence valve     AMS    Social History   Socioeconomic History  . Marital status: Married    Spouse name: Not on file  . Number of children: Not on file  . Years of education: Not on file  . Highest education level: Not on file  Occupational History  . Not on file  Social Needs  . Financial resource strain: Not on file  . Food insecurity:    Worry: Not on file    Inability: Not on file  . Transportation needs:    Medical: Not on file    Non-medical: Not on file  Tobacco Use  . Smoking status: Never Smoker  . Smokeless tobacco: Never Used  Substance and Sexual Activity  . Alcohol use: No    Alcohol/week: 0.0 oz  . Drug use: No  . Sexual activity: Never  Lifestyle  . Physical activity:    Days per week: Not on file    Minutes per session: Not on file  . Stress: Not on file  Relationships  . Social connections:    Talks on phone: Not on file    Gets together: Not on file    Attends religious service: Not on file    Active member of club or organization: Not on file    Attends meetings of clubs or organizations: Not on file    Relationship status: Not on file  . Intimate partner violence:    Fear of current or  ex partner: Not on file    Emotionally abused: Not on file    Physically abused: Not on file    Forced sexual activity: Not on file  Other Topics Concern  . Not on file  Social History Narrative  . Not on file    Family History  Problem Relation Age of Onset  . Cancer Mother   . Arthritis Mother   . Heart attack Father   . Arthritis Father   . Transient ischemic attack Brother   . Prostate cancer Neg Hx   . Bladder Cancer Neg Hx      Current Outpatient Medications:  .  abiraterone acetate (ZYTIGA) 250 MG tablet, TAKE 4 TABLETS (1,000 MG TOTAL) BY MOUTH DAILY. TAKE ON AN EMPTY STOMACH 1 HOUR BEFORE OR 2 HOURS AFTER A MEAL, Disp: 120 tablet, Rfl: 0 .  allopurinol (ZYLOPRIM) 300 MG tablet,  Take by mouth daily. In am., Disp: , Rfl:  .  DULoxetine (CYMBALTA) 30 MG capsule, Take 1 capsule (30 mg total) daily by mouth., Disp: 60 capsule, Rfl: 3 .  Glucosamine Sulfate 1000 MG CAPS, Take 1 capsule by mouth 2 (two) times daily. , Disp: , Rfl:  .  hydrochlorothiazide (HYDRODIURIL) 25 MG tablet, Take 25 mg by mouth. In am, Disp: , Rfl:  .  phentermine 15 MG capsule, Take 15 mg by mouth every morning., Disp: , Rfl:  .  predniSONE (DELTASONE) 5 MG tablet, Take 1 tablet (5 mg total) by mouth 2 (two) times daily with a meal., Disp: 60 tablet, Rfl: 5 .  simvastatin (ZOCOR) 40 MG tablet, Take 40 mg by mouth daily at 6 PM. , Disp: , Rfl:  .  warfarin (COUMADIN) 2 MG tablet, Take 2 mg by mouth once a week. On saturdays., Disp: , Rfl:  .  warfarin (COUMADIN) 5 MG tablet, TAKE ONE (1) TABLET EACH DAY, Disp: , Rfl:  .  acetaminophen (TYLENOL) 325 MG tablet, Take 650 mg by mouth 2 (two) times daily., Disp: , Rfl:  .  lidocaine-prilocaine (EMLA) cream, Apply small amount over port site for port flushes, Disp: 30 g, Rfl: 3 .  LORazepam (ATIVAN) 0.5 MG tablet, Take 1 tablet (0.5 mg total) by mouth every 6 (six) hours as needed (Nausea or vomiting). (Patient not taking: Reported on 02/28/2018), Disp: 30  tablet, Rfl: 0 .  ondansetron (ZOFRAN) 8 MG tablet, Take 1 tablet (8 mg total) by mouth 2 (two) times daily as needed for refractory nausea / vomiting. (Patient not taking: Reported on 02/28/2018), Disp: 30 tablet, Rfl: 1 .  prochlorperazine (COMPAZINE) 10 MG tablet, Take 1 tablet (10 mg total) by mouth every 6 (six) hours as needed (Nausea or vomiting). (Patient not taking: Reported on 02/28/2018), Disp: 30 tablet, Rfl: 1  Current Facility-Administered Medications:  .  lidocaine (XYLOCAINE) 2 % jelly 1 application, 1 application, Urethral, Once, Nickie Retort, MD  Physical exam:  Vitals:   05/22/18 1022  BP: (!) 128/91  Pulse: (!) 57  Resp: 18  Temp: (!) 97.4 F (36.3 C)  TempSrc: Tympanic  Weight: 251 lb 8.7 oz (114.1 kg)  Height: 6' 0.84" (1.85 m)   Physical Exam  Constitutional: He is oriented to person, place, and time.  Patient is obese. Does not appear in any acute distress  HENT:  Head: Normocephalic and atraumatic.  Eyes: Pupils are equal, round, and reactive to light. EOM are normal.  Neck: Normal range of motion.  Cardiovascular: Normal rate, regular rhythm and normal heart sounds.  Pulmonary/Chest: Effort normal and breath sounds normal.  Abdominal: Soft. Bowel sounds are normal.  Musculoskeletal: He exhibits edema (b/l trace).  Neurological: He is alert and oriented to person, place, and time.  Skin: Skin is warm and dry.     CMP Latest Ref Rng & Units 05/22/2018  Glucose 65 - 99 mg/dL 110(H)  BUN 6 - 20 mg/dL 41(H)  Creatinine 0.61 - 1.24 mg/dL 1.11  Sodium 135 - 145 mmol/L 138  Potassium 3.5 - 5.1 mmol/L 4.3  Chloride 101 - 111 mmol/L 100(L)  CO2 22 - 32 mmol/L 26  Calcium 8.9 - 10.3 mg/dL 9.8  Total Protein 6.5 - 8.1 g/dL 7.4  Total Bilirubin 0.3 - 1.2 mg/dL 1.5(H)  Alkaline Phos 38 - 126 U/L 82  AST 15 - 41 U/L 22  ALT 17 - 63 U/L 11(L)   CBC Latest Ref Rng &  Units 05/22/2018  WBC 3.8 - 10.6 K/uL 5.2  Hemoglobin 13.0 - 18.0 g/dL 13.1  Hematocrit  40.0 - 52.0 % 38.5(L)  Platelets 150 - 440 K/uL 136(L)    Assessment and plan- Patient is a 72 y.o. male with castrate resistant prostate cancer with LN mets and progression on docetaxel now on second line zytiga here for routine f/u of prostate cancer  PSA from today is pending. He is tolerating zytiga well. He will continue that along with prednisone. I will see him back in 4 weeks with cbc/ cmp and magnesium and PSA. Total bilirubin stable at around 1.5. Continue to monitor     Visit Diagnosis 1. Malignant neoplasm of prostate ([place])   2. Carcinoma of prostate ([place])   3. High risk medication use      Dr. Randa Evens, MD, MPH [facility] at [facility] [phone] 05/22/2018 12:48 PM

## 2018-05-22 NOTE — Progress Notes (Signed)
No new changes noted today 

## 2018-05-23 ENCOUNTER — Ambulatory Visit: Payer: PPO | Admitting: Oncology

## 2018-05-23 ENCOUNTER — Other Ambulatory Visit: Payer: PPO

## 2018-05-24 ENCOUNTER — Telehealth: Payer: Self-pay | Admitting: *Deleted

## 2018-05-24 NOTE — Telephone Encounter (Signed)
Patient asking for PSA results     Ref Range & Units 2d ago 4wk ago  Prostatic Specific Antigen 0.00 - 4.00 ng/mL 16.61High   16.46High  CM

## 2018-05-25 NOTE — Telephone Encounter (Signed)
Please let him know his psa has remained stable at 16 in the last 1 month. Hasnt gone up or down. No change in management at this time

## 2018-05-25 NOTE — Telephone Encounter (Signed)
I called pt' house and his wife answered and she said that pt was out of town right now. She took my message that the psa which is the tumor marker is 16 not up and not down , it is the same and for now we will keep same treatment going which is his zytiga She will give him the message

## 2018-05-25 NOTE — Telephone Encounter (Signed)
I had spoken with patient this morning and he is aware of results and doctor respnse

## 2018-06-13 ENCOUNTER — Telehealth: Payer: Self-pay | Admitting: Pharmacist

## 2018-06-13 NOTE — Telephone Encounter (Signed)
Oral Chemotherapy Pharmacist Encounter  Follow-Up Form  Called patient today to follow up regarding patient's oral chemotherapy medication: Zytiga (abiraterone)  Original Start date of oral chemotherapy: 03/2018  Pt reports 0 tablets/doses of Zytiga missed in the last month.    Pt reports the following side effects: None reported  Recent labs reviewed: PSA from 05/22/18  New medications?: None reported  Other Issues: None reported  Patient knows to call the office with questions or concerns. [facility] Clinic will continue to follow.  Darl Pikes, PharmD, BCPS, [facility] Hematology/Oncology Clinical Pharmacist [facility] [phone]  06/13/2018 11:37 AM

## 2018-06-19 ENCOUNTER — Inpatient Hospital Stay: Payer: PPO | Admitting: Oncology

## 2018-06-19 ENCOUNTER — Other Ambulatory Visit: Payer: Self-pay | Admitting: Internal Medicine

## 2018-06-19 ENCOUNTER — Inpatient Hospital Stay: Payer: PPO

## 2018-06-19 ENCOUNTER — Inpatient Hospital Stay: Payer: PPO | Attending: Oncology | Admitting: Oncology

## 2018-06-19 ENCOUNTER — Encounter: Payer: Self-pay | Admitting: Oncology

## 2018-06-19 VITALS — BP 146/79 | HR 92 | Temp 96.5°F | Resp 20 | Ht 72.84 in | Wt 252.5 lb

## 2018-06-19 DIAGNOSIS — C779 Secondary and unspecified malignant neoplasm of lymph node, unspecified: Secondary | ICD-10-CM | POA: Diagnosis not present

## 2018-06-19 DIAGNOSIS — C61 Malignant neoplasm of prostate: Secondary | ICD-10-CM

## 2018-06-19 DIAGNOSIS — Z79899 Other long term (current) drug therapy: Secondary | ICD-10-CM | POA: Diagnosis not present

## 2018-06-19 DIAGNOSIS — E876 Hypokalemia: Secondary | ICD-10-CM | POA: Diagnosis not present

## 2018-06-19 LAB — CBC WITH DIFFERENTIAL/PLATELET
BASOS ABS: 0 10*3/uL (ref 0–0.1)
Basophils Relative: 0 %
Eosinophils Absolute: 0 10*3/uL (ref 0–0.7)
Eosinophils Relative: 1 %
HEMATOCRIT: 39.9 % — AB (ref 40.0–52.0)
HEMOGLOBIN: 13.6 g/dL (ref 13.0–18.0)
LYMPHS PCT: 25 %
Lymphs Abs: 1.1 10*3/uL (ref 1.0–3.6)
MCH: 30.6 pg (ref 26.0–34.0)
MCHC: 34.1 g/dL (ref 32.0–36.0)
MCV: 89.9 fL (ref 80.0–100.0)
Monocytes Absolute: 0.3 10*3/uL (ref 0.2–1.0)
Monocytes Relative: 7 %
Neutro Abs: 3 10*3/uL (ref 1.4–6.5)
Neutrophils Relative %: 67 %
Platelets: 121 10*3/uL — ABNORMAL LOW (ref 150–440)
RBC: 4.44 MIL/uL (ref 4.40–5.90)
RDW: 15.9 % — ABNORMAL HIGH (ref 11.5–14.5)
WBC: 4.6 10*3/uL (ref 3.8–10.6)

## 2018-06-19 LAB — COMPREHENSIVE METABOLIC PANEL
ALBUMIN: 4.3 g/dL (ref 3.5–5.0)
ALT: 11 U/L (ref 0–44)
ANION GAP: 13 (ref 5–15)
AST: 22 U/L (ref 15–41)
Alkaline Phosphatase: 71 U/L (ref 38–126)
BILIRUBIN TOTAL: 2.1 mg/dL — AB (ref 0.3–1.2)
BUN: 29 mg/dL — ABNORMAL HIGH (ref 8–23)
CO2: 23 mmol/L (ref 22–32)
Calcium: 9.3 mg/dL (ref 8.9–10.3)
Chloride: 99 mmol/L (ref 98–111)
Creatinine, Ser: 0.92 mg/dL (ref 0.61–1.24)
GFR calc Af Amer: >60 mL/min (ref >60)
GFR calc non Af Amer: >60 mL/min (ref >60)
Glucose, Bld: 106 mg/dL — ABNORMAL HIGH (ref 70–99)
POTASSIUM: 3.4 mmol/L — AB (ref 3.5–5.1)
Sodium: 135 mmol/L (ref 135–145)
TOTAL PROTEIN: 6.9 g/dL (ref 6.5–8.1)

## 2018-06-19 LAB — MAGNESIUM: MAGNESIUM: 1.5 mg/dL — AB (ref 1.7–2.4)

## 2018-06-19 LAB — PSA: PROSTATIC SPECIFIC ANTIGEN: 14.82 ng/mL — AB (ref 0.00–4.00)

## 2018-06-19 MED ORDER — SODIUM CHLORIDE 0.9% FLUSH
10.0000 mL | INTRAVENOUS | Status: DC | PRN
  Administered 2018-06-19: 10 mL via INTRAVENOUS
  Filled 2018-06-19: qty 10

## 2018-06-19 MED ORDER — HEPARIN SOD (PORK) LOCK FLUSH 100 UNIT/ML IV SOLN
500.0000 [IU] | Freq: Once | INTRAVENOUS | Status: AC
  Administered 2018-06-19: 500 [IU] via INTRAVENOUS

## 2018-06-19 MED ORDER — POTASSIUM CHLORIDE CRYS ER 20 MEQ PO TBCR: 20.0000 meq | EXTENDED_RELEASE_TABLET | Freq: Every day | ORAL | 0 refills | Status: DC

## 2018-06-19 MED ORDER — MAGNESIUM OXIDE 400 (241.3 MG) MG PO TABS: 400.0000 mg | ORAL_TABLET | Freq: Every day | ORAL | 0 refills | Status: AC

## 2018-06-19 NOTE — Progress Notes (Signed)
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 He had biochemical recurrence in 2017 and was treated with IM RT. He again began to have a rising PSA in 2010 and underwent orchiectomy in 2010.his PSA was being monitored closely PSA a year ago was 0.5, 5 months ago was 3.9 in 2 months ago was 7.8.he underwent CT abdomen and pelvis in May 2018which showed abnormal periaortic and right common iliac lymph nodes. Index aortocaval lymph node 1.4 cm which was new as compared to February 2014 and suspicious for malignancy. Bone scan did not reveal any evidence of bony metastatic disease. He has been referred to [place] for systemic therapy options for CRPC  Patient lives with his wife and is independent of his ADL's. He has A fib for which he is on coumadin. Also has chronic urinary incontinence from prior prostate surgery and has AUS. Reports 50 pound intentional weight loss since October 2017  Repeat PSA in July 2018 was 13.3 indicating PSA doubling time of 3 months  Docetaxel chemo initiated for CRPC with ln mets and rapid doubling PSA in august 2018.Patient completed 5 cycles of docetaxel on 09/27/2017. Cycle #6 was not given due to progressive fatigue and cytopenias as well as worsening neuropathy. Cycle 5 of docetaxel was dose reduced to 60 mg/m square  In April 2019 patient noted to have rising PSA and increase in the size of retroperitoneal LN. Patient therefore started second line  zytiga    Interval history- patient is tolerating zytiga well without any side effects. He sometimes forgets to take his prednisone. Neuropathy symptoms in his foot are stable  ECOG PS- 1 Pain scale- 0   Review of systems- Review of Systems  Constitutional: Negative for chills, fever, malaise/fatigue and weight loss.  HENT: Negative for congestion, ear discharge and nosebleeds.   Eyes: Negative for blurred vision.  Respiratory: Negative for cough, hemoptysis, sputum production, shortness of breath and wheezing.   Cardiovascular: Negative for chest pain, palpitations, orthopnea and claudication.  Gastrointestinal: Negative for abdominal pain, blood in stool, constipation, diarrhea, heartburn, melena, nausea and vomiting.  Genitourinary: Negative for dysuria, flank pain, frequency, hematuria and urgency.  Musculoskeletal: Negative for back pain, joint pain and myalgias.  Skin: Negative for rash.  Neurological: Positive for tingling. Negative for dizziness, focal weakness, seizures, weakness and headaches.  Endo/Heme/Allergies: Does not bruise/bleed easily.  Psychiatric/Behavioral: Negative for depression and suicidal ideas. The patient does not have insomnia.       Allergies  Allergen Reactions  . Latex Rash    Sensitivity, not allergy.     Past Medical History:  Diagnosis Date  . Arthritis, degenerative 10/30/2015  . Atrial fibrillation ([place]) 10/30/2015  . Bladder spasm 06/12/2013  . Carcinoma of prostate ([place]) 10/30/2015  . Cardiomyopathy ([place]) 09/10/2014   Overview:  EF 35%,4/15   . Cerebrovascular accident (CVA) ([place]) 10/30/2015   Overview:  Left embolic [id number]   . Difficult or painful urination 11/21/2013  . Dysrhythmia   . Gout 10/30/2015  . History of kidney  stones   . Malignant neoplasm of prostate ([place]) 09/05/2012  . Sinoatrial node dysfunction ([facility]) 10/30/2015   Overview:  DDD pacemaker, 1996      Past Surgical History:  Procedure Laterality Date  .  castration  10/2009  . CATARACT EXTRACTION W/ INTRAOCULAR LENS  IMPLANT, BILATERAL Bilateral 2016   right eye done then the left one done 3 months later in 2016  . INGUINAL HERNIA REPAIR Left   . INSERT / REPLACE / REMOVE PACEMAKER    . IR FLUORO GUIDE PORT INSERTION RIGHT  07/01/2017  . JOINT REPLACEMENT Bilateral 1980   hips  . left knee replacement  2014  . PACEMAKER INSERTION    . PROSTATECTOMY    . REPLACEMENT TOTAL HIP W/  RESURFACING IMPLANTS  1997   bilateral-   . REPLACEMENT TOTAL KNEE Bilateral   . testes removal     per pt   . TONSILLECTOMY AND ADENOIDECTOMY    . TRANSURETHRAL RESECTION OF BLADDER TUMOR N/A 12/31/2015   Procedure: BLADDER BIOPSY;  Surgeon: Nickie Retort, MD;  Location: [facility] ORS;  Service: Urology;  Laterality: N/A;  . Urinary incontinence valve     AMS    Social History   Socioeconomic History  . Marital status: Married    Spouse name: Not on file  . Number of children: Not on file  . Years of education: Not on file  . Highest education level: Not on file  Occupational History  . Not on file  Social Needs  . Financial resource strain: Not on file  . Food insecurity:    Worry: Not on file    Inability: Not on file  . Transportation needs:    Medical: Not on file    Non-medical: Not on file  Tobacco Use  . Smoking status: Never Smoker  . Smokeless tobacco: Never Used  Substance and Sexual Activity  . Alcohol use: No    Alcohol/week: 0.0 oz  . Drug use: No  . Sexual activity: Never  Lifestyle  . Physical activity:    Days per week: Not on file    Minutes per session: Not on file  . Stress: Not on file  Relationships  . Social connections:    Talks on phone: Not on file    Gets together: Not on file    Attends religious service: Not on file    Active member of club or organization: Not on file    Attends meetings of clubs or organizations: Not on file    Relationship status: Not on file  . Intimate partner violence:    Fear of  current or ex partner: Not on file    Emotionally abused: Not on file    Physically abused: Not on file    Forced sexual activity: Not on file  Other Topics Concern  . Not on file  Social History Narrative  . Not on file    Family History  Problem Relation Age of Onset  . Cancer Mother   . Arthritis Mother   . Heart attack Father   . Arthritis Father   . Transient ischemic attack Brother   . Prostate cancer Neg Hx   . Bladder Cancer Neg Hx      Current Outpatient Medications:  .  abiraterone acetate (ZYTIGA) 250 MG tablet, TAKE 4 TABLETS (1,000 MG TOTAL) BY MOUTH DAILY. TAKE ON AN EMPTY STOMACH 1 HOUR BEFORE OR 2 HOURS AFTER A MEAL, Disp: 120 tablet, Rfl: 0 .  allopurinol (  ZYLOPRIM) 300 MG tablet, Take by mouth daily. In am., Disp: , Rfl:  .  DULoxetine (CYMBALTA) 30 MG capsule, Take 1 capsule (30 mg total) daily by mouth., Disp: 60 capsule, Rfl: 3 .  Glucosamine Sulfate 1000 MG CAPS, Take 1 capsule by mouth 2 (two) times daily. , Disp: , Rfl:  .  hydrochlorothiazide (HYDRODIURIL) 25 MG tablet, Take 25 mg by mouth. In am, Disp: , Rfl:  .  phentermine 15 MG capsule, Take 15 mg by mouth every morning., Disp: , Rfl:  .  predniSONE (DELTASONE) 5 MG tablet, Take 1 tablet (5 mg total) by mouth 2 (two) times daily with a meal., Disp: 60 tablet, Rfl: 5 .  simvastatin (ZOCOR) 40 MG tablet, Take 40 mg by mouth daily at 6 PM. , Disp: , Rfl:  .  warfarin (COUMADIN) 2 MG tablet, Take 2 mg by mouth once a week. On saturdays., Disp: , Rfl:  .  warfarin (COUMADIN) 5 MG tablet, TAKE ONE (1) TABLET EACH DAY, Disp: , Rfl:  .  acetaminophen (TYLENOL) 325 MG tablet, Take 650 mg by mouth 2 (two) times daily., Disp: , Rfl:  .  lidocaine-prilocaine (EMLA) cream, Apply small amount over port site for port flushes (Patient not taking: Reported on 06/19/2018), Disp: 30 g, Rfl: 3 .  LORazepam (ATIVAN) 0.5 MG tablet, Take 1 tablet (0.5 mg total) by mouth every 6 (six) hours as needed (Nausea or vomiting).  (Patient not taking: Reported on 02/28/2018), Disp: 30 tablet, Rfl: 0 .  magnesium oxide (MAG-OX) 400 (241.3 Mg) MG tablet, Take 1 tablet (400 mg total) by mouth daily for 14 days., Disp: 14 tablet, Rfl: 0 .  ondansetron (ZOFRAN) 8 MG tablet, Take 1 tablet (8 mg total) by mouth 2 (two) times daily as needed for refractory nausea / vomiting. (Patient not taking: Reported on 02/28/2018), Disp: 30 tablet, Rfl: 1 .  potassium chloride SA (K-DUR,KLOR-CON) 20 MEQ tablet, Take 1 tablet (20 mEq total) by mouth daily for 14 days., Disp: 14 tablet, Rfl: 0 .  prochlorperazine (COMPAZINE) 10 MG tablet, Take 1 tablet (10 mg total) by mouth every 6 (six) hours as needed (Nausea or vomiting). (Patient not taking: Reported on 02/28/2018), Disp: 30 tablet, Rfl: 1  Current Facility-Administered Medications:  .  lidocaine (XYLOCAINE) 2 % jelly 1 application, 1 application, Urethral, Once, Pilar Jarvis, Horald Pollen, MD  Facility-Administered Medications Ordered in Other Visits:  .  sodium chloride flush (NS) 0.9 % injection 10 mL, 10 mL, Intravenous, PRN, Sindy Guadeloupe, MD, 10 mL at 06/19/18 1108  Physical exam:  Vitals:   06/19/18 1110 06/19/18 1112  BP: (!) 146/79   Pulse: 92   Resp: 20   Temp: (!) 96.5 F (35.8 C)   TempSrc: Tympanic   Weight:  252 lb 8.6 oz (114.5 kg)  Height:  6' 0.84" (1.85 m)   Physical Exam  Constitutional: He is oriented to person, place, and time.  Pt is obese. No acute distress  HENT:  Head: Normocephalic and atraumatic.  Eyes: Pupils are equal, round, and reactive to light. EOM are normal.  Neck: Normal range of motion.  Cardiovascular: Normal rate, regular rhythm and normal heart sounds.  Pulmonary/Chest: Effort normal and breath sounds normal.  Abdominal: Soft. Bowel sounds are normal.  Neurological: He is alert and oriented to person, place, and time.  Skin: Skin is warm and dry.     CMP Latest Ref Rng & Units 06/19/2018  Glucose 70 - 99 mg/dL 106(H)  BUN  8 - 23 mg/dL 29(H)    Creatinine 0.61 - 1.24 mg/dL 0.92  Sodium 135 - 145 mmol/L 135  Potassium 3.5 - 5.1 mmol/L 3.4(L)  Chloride 98 - 111 mmol/L 99  CO2 22 - 32 mmol/L 23  Calcium 8.9 - 10.3 mg/dL 9.3  Total Protein 6.5 - 8.1 g/dL 6.9  Total Bilirubin 0.3 - 1.2 mg/dL 2.1(H)  Alkaline Phos 38 - 126 U/L 71  AST 15 - 41 U/L 22  ALT 0 - 44 U/L 11   CBC Latest Ref Rng & Units 06/19/2018  WBC 3.8 - 10.6 K/uL 4.6  Hemoglobin 13.0 - 18.0 g/dL 13.6  Hematocrit 40.0 - 52.0 % 39.9(L)  Platelets 150 - 440 K/uL 121(L)     Assessment and plan- Patient is a 73 y.o. male with CRPC and retroperitoneal LN mets that has progressed on docetaxel now on second line zytiga  Patient has a gradually up trendign bilirubin up to 2.1 today. He has had mild self limited elevation of t bili in the past as well when he was not on zytiga. AST/ALT normal. I will continue zytiga at this point and repeat levels in 2 and 4 weeks. See him back in 4 weeks- cbc/cmp/mg/psa and fractionated bilirubin. If it continues to rise, I will need to hold zytiga and switch him to xtandi.   His psa has not responded well after change to zytiga and has ramined around 16. Levels from today are pending. Repeat ct chest abdomen pelvis with contrast and bone scan in 2 weeks time  He is s/p bilateral orchiectomy and therefore not on ADT  Hypokalemia/ hypomagnesemia- will prescribe 20 meq PO K and 400 eq Mag for 2 weeks. Repeat mg and cmp in 2 weeks   Visit Diagnosis 1. Carcinoma of prostate ([place])   2. High risk medication use   3. Hypomagnesemia   4. Hypokalemia      Dr. Randa Evens, MD, MPH [facility] [phone] 06/19/2018 1:17 PM

## 2018-06-19 NOTE — Progress Notes (Signed)
No new changes noted today 

## 2018-06-21 DIAGNOSIS — I482 Chronic atrial fibrillation: Secondary | ICD-10-CM | POA: Diagnosis not present

## 2018-06-22 MED FILL — predniSONE 5 MG TABS: 5 | 30 days supply | Qty: 60 | Fill #3

## 2018-06-22 MED FILL — ABIRATERONE ACETATE 250 MG: 250 | 30 days supply | Qty: 120 | Fill #0

## 2018-07-04 ENCOUNTER — Ambulatory Visit
Admission: RE | Admit: 2018-07-04 | Discharge: 2018-07-04 | Disposition: A | Discharge status: Home / Self Care | Payer: PPO | Source: Ambulatory Visit | Attending: Oncology | Admitting: Oncology

## 2018-07-04 ENCOUNTER — Inpatient Hospital Stay: Payer: PPO

## 2018-07-04 DIAGNOSIS — Z79899 Other long term (current) drug therapy: Secondary | ICD-10-CM | POA: Diagnosis not present

## 2018-07-04 DIAGNOSIS — C61 Malignant neoplasm of prostate: Secondary | ICD-10-CM | POA: Insufficient documentation

## 2018-07-04 DIAGNOSIS — R590 Localized enlarged lymph nodes: Secondary | ICD-10-CM | POA: Insufficient documentation

## 2018-07-04 MED ORDER — TECHNETIUM TC 99M MEDRONATE IV KIT
20.0000 | PACK | Freq: Once | INTRAVENOUS | Status: AC | PRN
  Administered 2018-07-04: 22.93 via INTRAVENOUS

## 2018-07-04 MED ORDER — IOPAMIDOL (ISOVUE-300) INJECTION 61%
100.0000 mL | Freq: Once | INTRAVENOUS | Status: AC | PRN
  Administered 2018-07-04: 100 mL via INTRAVENOUS

## 2018-07-05 ENCOUNTER — Inpatient Hospital Stay: Payer: PPO

## 2018-07-05 DIAGNOSIS — Z79899 Other long term (current) drug therapy: Secondary | ICD-10-CM

## 2018-07-05 DIAGNOSIS — C61 Malignant neoplasm of prostate: Secondary | ICD-10-CM | POA: Diagnosis not present

## 2018-07-05 LAB — COMPREHENSIVE METABOLIC PANEL
ALT: 12 U/L (ref 0–44)
ANION GAP: 13 (ref 5–15)
AST: 24 U/L (ref 15–41)
Albumin: 4.4 g/dL (ref 3.5–5.0)
Alkaline Phosphatase: 62 U/L (ref 38–126)
BUN: 39 mg/dL — ABNORMAL HIGH (ref 8–23)
CHLORIDE: 100 mmol/L (ref 98–111)
CO2: 23 mmol/L (ref 22–32)
Calcium: 9.3 mg/dL (ref 8.9–10.3)
Creatinine, Ser: 1.56 mg/dL — ABNORMAL HIGH (ref 0.61–1.24)
GFR calc Af Amer: 51 mL/min — ABNORMAL LOW (ref >60)
GFR, EST NON AFRICAN AMERICAN: 44 mL/min — AB (ref >60)
Glucose, Bld: 87 mg/dL (ref 70–99)
POTASSIUM: 3.5 mmol/L (ref 3.5–5.1)
SODIUM: 136 mmol/L (ref 135–145)
Total Bilirubin: 1.7 mg/dL — ABNORMAL HIGH (ref 0.3–1.2)
Total Protein: 6.7 g/dL (ref 6.5–8.1)

## 2018-07-05 LAB — MAGNESIUM: MAGNESIUM: 1.6 mg/dL — AB (ref 1.7–2.4)

## 2018-07-10 ENCOUNTER — Other Ambulatory Visit: Payer: PPO

## 2018-07-17 ENCOUNTER — Other Ambulatory Visit: Payer: Self-pay | Admitting: Oncology

## 2018-07-17 ENCOUNTER — Encounter: Payer: Self-pay | Admitting: Oncology

## 2018-07-17 ENCOUNTER — Inpatient Hospital Stay: Payer: PPO | Attending: Oncology | Admitting: Oncology

## 2018-07-17 ENCOUNTER — Inpatient Hospital Stay: Payer: PPO

## 2018-07-17 VITALS — BP 129/81 | HR 43 | Temp 97.3°F | Resp 18 | Ht 72.84 in | Wt 255.3 lb

## 2018-07-17 DIAGNOSIS — Z79899 Other long term (current) drug therapy: Secondary | ICD-10-CM

## 2018-07-17 DIAGNOSIS — I4891 Unspecified atrial fibrillation: Secondary | ICD-10-CM | POA: Insufficient documentation

## 2018-07-17 DIAGNOSIS — C61 Malignant neoplasm of prostate: Secondary | ICD-10-CM

## 2018-07-17 DIAGNOSIS — M8580 Other specified disorders of bone density and structure, unspecified site: Secondary | ICD-10-CM | POA: Insufficient documentation

## 2018-07-17 DIAGNOSIS — C779 Secondary and unspecified malignant neoplasm of lymph node, unspecified: Secondary | ICD-10-CM | POA: Insufficient documentation

## 2018-07-17 LAB — CBC WITH DIFFERENTIAL/PLATELET
BASOS PCT: 0 %
Basophils Absolute: 0 10*3/uL (ref 0–0.1)
EOS ABS: 0 10*3/uL (ref 0–0.7)
EOS PCT: 1 %
HCT: 41.7 % (ref 40.0–52.0)
HEMOGLOBIN: 14 g/dL (ref 13.0–18.0)
LYMPHS ABS: 1.1 10*3/uL (ref 1.0–3.6)
Lymphocytes Relative: 20 %
MCH: 30.9 pg (ref 26.0–34.0)
MCHC: 33.5 g/dL (ref 32.0–36.0)
MCV: 92.1 fL (ref 80.0–100.0)
Monocytes Absolute: 0.4 10*3/uL (ref 0.2–1.0)
Monocytes Relative: 8 %
Neutro Abs: 3.8 10*3/uL (ref 1.4–6.5)
Neutrophils Relative %: 71 %
Platelets: 142 10*3/uL — ABNORMAL LOW (ref 150–440)
RBC: 4.53 MIL/uL (ref 4.40–5.90)
RDW: 16 % — ABNORMAL HIGH (ref 11.5–14.5)
WBC: 5.4 10*3/uL (ref 3.8–10.6)

## 2018-07-17 LAB — COMPREHENSIVE METABOLIC PANEL
ALT: 13 U/L (ref 0–44)
AST: 23 U/L (ref 15–41)
Albumin: 4.6 g/dL (ref 3.5–5.0)
Alkaline Phosphatase: 67 U/L (ref 38–126)
Anion gap: 15 (ref 5–15)
BUN: 31 mg/dL — ABNORMAL HIGH (ref 8–23)
CO2: 24 mmol/L (ref 22–32)
CREATININE: 1.06 mg/dL (ref 0.61–1.24)
Calcium: 9.5 mg/dL (ref 8.9–10.3)
Chloride: 99 mmol/L (ref 98–111)
GFR calc Af Amer: >60 mL/min (ref >60)
Glucose, Bld: 107 mg/dL — ABNORMAL HIGH (ref 70–99)
Potassium: 4.4 mmol/L (ref 3.5–5.1)
Sodium: 138 mmol/L (ref 135–145)
Total Bilirubin: 1.4 mg/dL — ABNORMAL HIGH (ref 0.3–1.2)
Total Protein: 7.2 g/dL (ref 6.5–8.1)

## 2018-07-17 LAB — MAGNESIUM: Magnesium: 1.6 mg/dL — ABNORMAL LOW (ref 1.7–2.4)

## 2018-07-17 LAB — PSA: Prostatic Specific Antigen: 13.85 ng/mL — ABNORMAL HIGH (ref 0.00–4.00)

## 2018-07-17 NOTE — Progress Notes (Signed)
Pt doing ok , here to get results from scans and decide on cont. With zytiga or start xtandi.

## 2018-07-17 NOTE — Progress Notes (Signed)
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 He had biochemical recurrence in 2017 and was treated with IM RT. He again began to have a rising PSA in 2010 and underwent orchiectomy in 2010.his PSA was being monitored closely PSA a year ago was 0.5, 5 months ago was 3.9 in 2 months ago was 7.8.he underwent CT abdomen and pelvis in May 2018which showed abnormal periaortic and right common iliac lymph nodes. Index aortocaval lymph node 1.4 cm which was new as compared to February 2014 and suspicious for malignancy. Bone scan did not reveal any evidence of bony metastatic disease. He has been referred to [place] for systemic therapy options for CRPC  Patient lives with his wife and is independent of his ADL's. He has A fib for which he is on coumadin. Also has chronic urinary incontinence from prior prostate surgery and has AUS. Reports 50 pound intentional weight loss since October 2017  Repeat PSA in July 2018 was 13.3 indicating PSA doubling time of 3 months  Docetaxel chemo initiated for CRPC with ln mets and rapid doubling PSA in august 2018.Patient completed 5 cycles of docetaxel on 09/27/2017. Cycle #6 was not given due to progressive fatigue and cytopenias as well as worsening neuropathy. Cycle 5 of docetaxel was dose reduced to 60 mg/m square  In April 2019 patient noted to have rising PSA and increase in the size of retroperitoneal LN. Patient therefore started second line  zytiga   Interval history- he is tolerating zytiga well. Neuropathy is stable. No recurrent falls. He has a [employer] trip coming up next month for 45 days. Denies specific complaints today  ECOG PS- 1 Pain scale- 0 Opioid associated constipation- no  Review of systems- Review of Systems  Constitutional: Negative for chills, fever, malaise/fatigue and weight loss.  HENT: Negative for congestion, ear discharge and nosebleeds.   Eyes: Negative for blurred vision.  Respiratory: Negative for cough, hemoptysis, sputum production, shortness of breath and wheezing.   Cardiovascular: Negative for chest pain, palpitations, orthopnea and claudication.  Gastrointestinal: Negative for abdominal pain, blood in stool, constipation, diarrhea, heartburn, melena, nausea and vomiting.  Genitourinary: Negative for dysuria, flank pain, frequency, hematuria and urgency.  Musculoskeletal: Negative for back pain, joint pain and myalgias.  Skin: Negative for rash.  Neurological: Negative for dizziness, tingling, focal weakness, seizures, weakness and headaches.  Endo/Heme/Allergies: Does not bruise/bleed easily.  Psychiatric/Behavioral: Negative for depression and suicidal ideas. The patient does not have insomnia.       Allergies  Allergen Reactions  . Latex Rash    Sensitivity, not allergy.     Past Medical History:  Diagnosis Date  . Arthritis, degenerative 10/30/2015  . Atrial fibrillation ([place]) 10/30/2015  . Bladder spasm 06/12/2013  . Carcinoma of prostate ([place]) 10/30/2015  . Cardiomyopathy ([place]) 09/10/2014   Overview:  EF 35%,4/15   . Cerebrovascular accident (CVA) ([place]) 10/30/2015   Overview:  Left embolic [id number],0160   . Difficult or painful urination 11/21/2013  . Dysrhythmia   . Gout 10/30/2015  .  History of kidney stones   . Malignant neoplasm of prostate ([place]) 09/05/2012  . Sinoatrial node dysfunction ([facility]) 10/30/2015   Overview:  DDD pacemaker, 1996      Past Surgical History:   Procedure Laterality Date  . castration  10/2009  . CATARACT EXTRACTION W/ INTRAOCULAR LENS  IMPLANT, BILATERAL Bilateral 2016   right eye done then the left one done 3 months later in 2016  . INGUINAL HERNIA REPAIR Left   . INSERT / REPLACE / REMOVE PACEMAKER    . IR FLUORO GUIDE PORT INSERTION RIGHT  07/01/2017  . JOINT REPLACEMENT Bilateral 1980   hips  . left knee replacement  2014  . PACEMAKER INSERTION    . PROSTATECTOMY    . REPLACEMENT TOTAL HIP W/  RESURFACING IMPLANTS  1997   bilateral-   . REPLACEMENT TOTAL KNEE Bilateral   . testes removal     per pt   . TONSILLECTOMY AND ADENOIDECTOMY    . TRANSURETHRAL RESECTION OF BLADDER TUMOR N/A 12/31/2015   Procedure: BLADDER BIOPSY;  Surgeon: Nickie Retort, MD;  Location: [facility] ORS;  Service: Urology;  Laterality: N/A;  . Urinary incontinence valve     AMS    Social History   Socioeconomic History  . Marital status: Married    Spouse name: Not on file  . Number of children: Not on file  . Years of education: Not on file  . Highest education level: Not on file  Occupational History  . Not on file  Social Needs  . Financial resource strain: Not on file  . Food insecurity:    Worry: Not on file    Inability: Not on file  . Transportation needs:    Medical: Not on file    Non-medical: Not on file  Tobacco Use  . Smoking status: Never Smoker  . Smokeless tobacco: Never Used  Substance and Sexual Activity  . Alcohol use: No    Alcohol/week: 0.0 oz  . Drug use: No  . Sexual activity: Never  Lifestyle  . Physical activity:    Days per week: Not on file    Minutes per session: Not on file  . Stress: Not on file  Relationships  . Social connections:    Talks on phone: Not on file    Gets together: Not on file    Attends religious service: Not on file    Active member of club or organization: Not on file    Attends meetings of clubs or organizations: Not on file    Relationship status: Not on file  .  Intimate partner violence:    Fear of current or ex partner: Not on file    Emotionally abused: Not on file    Physically abused: Not on file    Forced sexual activity: Not on file  Other Topics Concern  . Not on file  Social History Narrative  . Not on file    Family History  Problem Relation Age of Onset  . Cancer Mother   . Arthritis Mother   . Heart attack Father   . Arthritis Father   . Transient ischemic attack Brother   . Prostate cancer Neg Hx   . Bladder Cancer Neg Hx      Current Outpatient Medications:  .  acetaminophen (TYLENOL) 325 MG tablet, Take 650 mg by mouth 2 (two) times daily., Disp: , Rfl:  .  allopurinol (ZYLOPRIM) 300 MG tablet, Take by mouth daily. In am., Disp: , Rfl:  .  hydrochlorothiazide (HYDRODIURIL) 25 MG tablet, Take 25 mg by mouth. In am, Disp: , Rfl:  .  lidocaine-prilocaine (EMLA) cream, Apply small amount over port site for port flushes, Disp: 30 g, Rfl: 3 .  predniSONE (DELTASONE) 5 MG tablet, Take 1 tablet (5 mg total) by mouth 2 (two) times daily with a meal., Disp: 60 tablet, Rfl: 5 .  simvastatin (ZOCOR) 40 MG tablet, Take 40 mg by mouth daily at 6 PM. , Disp: , Rfl:  .  warfarin (COUMADIN) 2 MG tablet, Take 2 mg by mouth 2 (two) times a week. tues and thurs, Disp: , Rfl:  .  warfarin (COUMADIN) 5 MG tablet, TAKE ONE (1) TABLET EACH DAY on mon, wed, fri, sat, sun, Disp: , Rfl:  .  abiraterone acetate (ZYTIGA) 250 MG tablet, TAKE 4 TABLETS (1,000 MG TOTAL) BY MOUTH DAILY. TAKE ON AN EMPTY STOMACH 1 HOUR BEFORE OR 2 HOURS AFTER A MEAL, Disp: 120 tablet, Rfl: 0 .  DULoxetine (CYMBALTA) 30 MG capsule, Take 1 capsule (30 mg total) daily by mouth., Disp: 60 capsule, Rfl: 3 .  Glucosamine Sulfate 1000 MG CAPS, Take 1 capsule by mouth 2 (two) times daily. , Disp: , Rfl:  .  LORazepam (ATIVAN) 0.5 MG tablet, Take 1 tablet (0.5 mg total) by mouth every 6 (six) hours as needed (Nausea or vomiting). (Patient not taking: Reported on 02/28/2018), Disp: 30  tablet, Rfl: 0 .  ondansetron (ZOFRAN) 8 MG tablet, Take 1 tablet (8 mg total) by mouth 2 (two) times daily as needed for refractory nausea / vomiting. (Patient not taking: Reported on 02/28/2018), Disp: 30 tablet, Rfl: 1 .  phentermine 15 MG capsule, Take 15 mg by mouth every morning., Disp: , Rfl:  .  potassium chloride SA (K-DUR,KLOR-CON) 20 MEQ tablet, Take 1 tablet (20 mEq total) by mouth daily for 14 days., Disp: 14 tablet, Rfl: 0 .  prochlorperazine (COMPAZINE) 10 MG tablet, Take 1 tablet (10 mg total) by mouth every 6 (six) hours as needed (Nausea or vomiting). (Patient not taking: Reported on 02/28/2018), Disp: 30 tablet, Rfl: 1  Current Facility-Administered Medications:  .  lidocaine (XYLOCAINE) 2 % jelly 1 application, 1 application, Urethral, Once, Nickie Retort, MD  Physical exam:  Vitals:   07/17/18 1102  BP: 129/81  Pulse: (!) 43  Resp: 18  Temp: (!) 97.3 F (36.3 C)  TempSrc: Tympanic  Weight: 255 lb 4.7 oz (115.8 kg)  Height: 6' 0.84" (1.85 m)   Physical Exam  Constitutional: He is oriented to person, place, and time. He appears well-developed and well-nourished.  HENT:  Head: Normocephalic and atraumatic.  Eyes: Pupils are equal, round, and reactive to light. EOM are normal.  Neck: Normal range of motion.  Cardiovascular: Normal rate, regular rhythm and normal heart sounds.  Pulmonary/Chest: Effort normal and breath sounds normal.  Abdominal: Soft. Bowel sounds are normal.  Musculoskeletal: He exhibits edema (b/l trace).  Neurological: He is alert and oriented to person, place, and time.  Skin: Skin is warm and dry.     CMP Latest Ref Rng & Units 07/17/2018  Glucose 70 - 99 mg/dL 107(H)  BUN 8 - 23 mg/dL 31(H)  Creatinine 0.61 - 1.24 mg/dL 1.06  Sodium 135 - 145 mmol/L 138  Potassium 3.5 - 5.1 mmol/L 4.4  Chloride 98 - 111 mmol/L 99  CO2 22 - 32 mmol/L 24  Calcium 8.9 - 10.3 mg/dL 9.5  Total Protein 6.5 - 8.1 g/dL 7.2  Total Bilirubin 0.3 -  1.2 mg/dL  1.4(H)  Alkaline Phos 38 - 126 U/L 67  AST 15 - 41 U/L 23  ALT 0 - 44 U/L 13   CBC Latest Ref Rng & Units 07/17/2018  WBC 3.8 - 10.6 K/uL 5.4  Hemoglobin 13.0 - 18.0 g/dL 14.0  Hematocrit 40.0 - 52.0 % 41.7  Platelets 150 - 440 K/uL 142(L)    No images are attached to the encounter.  Ct Chest W Contrast  Addendum Date: 07/04/2018   ADDENDUM REPORT: 07/04/2018 15:44 ADDENDUM: Addendum for addition of findings: There is a new 6 mm sclerotic focus within the inferior aspect of the left scapula (image 33; series 2), compatible with metastatic disease, new from prior. This is demonstrated as well on bone scan dated same day. Electronically Signed   By: Lovey Newcomer M.D.   On: 07/04/2018 15:44   Result Date: 07/04/2018 CLINICAL DATA:  Patient with history of metastatic prostate cancer. EXAM: CT CHEST, ABDOMEN, AND PELVIS WITH CONTRAST TECHNIQUE: Multidetector CT imaging of the chest, abdomen and pelvis was performed following the standard protocol during bolus administration of intravenous contrast. CONTRAST:  132mL ISOVUE-300 IOPAMIDOL (ISOVUE-300) INJECTION 61% COMPARISON:  CT CAP 03/17/2018 FINDINGS: CT CHEST FINDINGS Cardiovascular: Right anterior chest wall Port-A-Cath is present with tip terminating in the superior vena cava. Heart is mildly enlarged. Coronary arterial vascular calcifications. Thoracic aortic vascular calcifications. Mediastinum/Nodes: No enlarged axillary, mediastinal or hilar lymphadenopathy. Normal appearance of the esophagus. Lungs/Pleura: Dependent atelectasis/scarring within the bilateral lower lobes. Stable 4 mm subpleural left lower lobe nodule (image 61; series 4). No large area of pulmonary consolidation. No pleural effusion or pneumothorax. Musculoskeletal: Thoracic spine degenerative changes. No aggressive or acute appearing osseous lesions. Multiple healing posterior right rib fractures, involving right eighth, ninth, tenth and eleventh ribs. Healing fractures of the  anterior right and left third ribs. CT ABDOMEN PELVIS FINDINGS Hepatobiliary: Liver is normal in size and contour. No focal lesion identified. Gallbladder is unremarkable. No intrahepatic or extrahepatic biliary ductal dilatation. Pancreas: Unremarkable Spleen: Unremarkable Adrenals/Urinary Tract: Normal adrenal glands. Kidneys enhance symmetrically with contrast. Urinary bladder is not well visualized due to streak artifact. Stomach/Bowel: Normal morphology of the stomach. No evidence for bowel obstruction. No free fluid or free intraperitoneal air. Vascular/Lymphatic: Normal caliber abdominal aorta. Peripheral calcified atherosclerotic plaque. Mild interval decrease in size of retroperitoneal lymph nodes. Reference aortocaval lymph node measures 9 mm (image 73; series 2), previously 12 mm. Reference aortocaval lymph node measures 8 mm (image 76; series 2), previously 11 mm. Right iliac lymph node measures 11 mm (image 92; series 2), previously 12 mm. Reproductive: Status post prostatectomy. Evaluation of the surgical bed nondiagnostic due to streak artifact. Other: None. Musculoskeletal: Bilateral hip arthroplasties. Similar-appearing lucency superior to the left arthroplasty measuring 4.4 x 2.2 cm. Unchanged fluid within the left gluteal musculature measuring up to 3.8 cm (image 106; series 2). Lumbar spine degenerative changes. Similar-appearing T12 wedge compression deformity. IMPRESSION: 1. Slight interval decrease in size of retroperitoneal lymphadenopathy. 2. Re demonstrated multiple suspected bilateral healing rib fractures. Electronically Signed: By: Lovey Newcomer M.D. On: 07/04/2018 13:25   Nm Bone Scan Whole Body  Result Date: 07/04/2018 CLINICAL DATA:  72 year old male with prostate cancer. Post bilateral knee and hip replacement. Subsequent encounter. EXAM: NUCLEAR MEDICINE WHOLE BODY BONE SCAN TECHNIQUE: Whole body anterior and posterior images were obtained approximately 3 hours after intravenous  injection of radiopharmaceutical. RADIOPHARMACEUTICALS:  22.93 mCi Technetium-50m MDP IV COMPARISON:  03/17/2018 and 11/02/2017 bone scan. 07/04/2018 and 03/17/2018  CT chest, abdomen and pelvis. FINDINGS: New focal area of radiotracer uptake inferior left scapula. Distribution of radiotracer uptake similar to the 03/17/2018 examination. Some of the areas of radiotracer uptake appear less intense suggesting healing of fractures (rib fractures and T12 compression fracture). Prominent radiotracer uptake surrounding the shoulder joints bilaterally without significant change and most consistent with degenerative changes. Radiotracer uptake at the sternoclavicular articulation and within feet and wrists consistent with degenerative changes. Slight radiotracer uptake left calvarium without significant change. Mild long segment radiotracer uptake of the femur and tibia bilaterally unchanged may represent reactive changes from prosthesis placement. This does not have the typical configuration to suggest loosening or infection. Both kidneys visualized. IMPRESSION: New focal area of radiotracer uptake inferior left scapula. This corresponds to small sclerotic focus on recent CT (series 6, image 183 and series 2, image 33). This is consistent with a new osseous metastatic lesion. Otherwise, areas of radiotracer uptake either appear less intense (healing fractures) or related to degenerative changes as detailed above. Electronically Signed   By: Genia Del M.D.   On: 07/04/2018 15:29   Ct Abdomen Pelvis W Contrast  Addendum Date: 07/04/2018   ADDENDUM REPORT: 07/04/2018 15:44 ADDENDUM: Addendum for addition of findings: There is a new 6 mm sclerotic focus within the inferior aspect of the left scapula (image 33; series 2), compatible with metastatic disease, new from prior. This is demonstrated as well on bone scan dated same day. Electronically Signed   By: Lovey Newcomer M.D.   On: 07/04/2018 15:44   Result Date:  07/04/2018 CLINICAL DATA:  Patient with history of metastatic prostate cancer. EXAM: CT CHEST, ABDOMEN, AND PELVIS WITH CONTRAST TECHNIQUE: Multidetector CT imaging of the chest, abdomen and pelvis was performed following the standard protocol during bolus administration of intravenous contrast. CONTRAST:  123mL ISOVUE-300 IOPAMIDOL (ISOVUE-300) INJECTION 61% COMPARISON:  CT CAP 03/17/2018 FINDINGS: CT CHEST FINDINGS Cardiovascular: Right anterior chest wall Port-A-Cath is present with tip terminating in the superior vena cava. Heart is mildly enlarged. Coronary arterial vascular calcifications. Thoracic aortic vascular calcifications. Mediastinum/Nodes: No enlarged axillary, mediastinal or hilar lymphadenopathy. Normal appearance of the esophagus. Lungs/Pleura: Dependent atelectasis/scarring within the bilateral lower lobes. Stable 4 mm subpleural left lower lobe nodule (image 61; series 4). No large area of pulmonary consolidation. No pleural effusion or pneumothorax. Musculoskeletal: Thoracic spine degenerative changes. No aggressive or acute appearing osseous lesions. Multiple healing posterior right rib fractures, involving right eighth, ninth, tenth and eleventh ribs. Healing fractures of the anterior right and left third ribs. CT ABDOMEN PELVIS FINDINGS Hepatobiliary: Liver is normal in size and contour. No focal lesion identified. Gallbladder is unremarkable. No intrahepatic or extrahepatic biliary ductal dilatation. Pancreas: Unremarkable Spleen: Unremarkable Adrenals/Urinary Tract: Normal adrenal glands. Kidneys enhance symmetrically with contrast. Urinary bladder is not well visualized due to streak artifact. Stomach/Bowel: Normal morphology of the stomach. No evidence for bowel obstruction. No free fluid or free intraperitoneal air. Vascular/Lymphatic: Normal caliber abdominal aorta. Peripheral calcified atherosclerotic plaque. Mild interval decrease in size of retroperitoneal lymph nodes. Reference  aortocaval lymph node measures 9 mm (image 73; series 2), previously 12 mm. Reference aortocaval lymph node measures 8 mm (image 76; series 2), previously 11 mm. Right iliac lymph node measures 11 mm (image 92; series 2), previously 12 mm. Reproductive: Status post prostatectomy. Evaluation of the surgical bed nondiagnostic due to streak artifact. Other: None. Musculoskeletal: Bilateral hip arthroplasties. Similar-appearing lucency superior to the left arthroplasty measuring 4.4 x 2.2 cm. Unchanged fluid within the left  gluteal musculature measuring up to 3.8 cm (image 106; series 2). Lumbar spine degenerative changes. Similar-appearing T12 wedge compression deformity. IMPRESSION: 1. Slight interval decrease in size of retroperitoneal lymphadenopathy. 2. Re demonstrated multiple suspected bilateral healing rib fractures. Electronically Signed: By: Lovey Newcomer M.D. On: 07/04/2018 13:25     Assessment and plan- Patient is a 72 y.o. male with CRPC s/p b/l orchiectomy currently on zytiga. He had disease progression on docetaxel  Patient has low volume retroperitoneal adenopathy from his prostate cancer which had progressed on docetaxel when he was switched to zytiga. His psa has responded very slowly on zytiga- has come down from 19-14. I have reviewed CT chest abdomen pelvis and bone scan images independently and discussed findings with him. There is a reduction in the size of retroperitoneal adenopathy.  However bone scan shows a new sclerotic lesion in the inferior aspect of the left scapula.  Besides that there was no other evidence of bony metastatic disease.  Given that his PSA is responding and there is a decrease in the size of adenopathy with a small new appearing bone lesion I will continue his Zytiga at this time.  His bilirubin which had gone up to 2.1 is back down to 1.4.  I will see him back in about 4 weeks time with CBC CMP and PSA before he leaves for his 45-day trip in September.   Visit  Diagnosis 1. Malignant neoplasm of prostate ([place])   2. High risk medication use      Dr. Randa Evens, MD, MPH [facility] [phone] 07/17/2018 2:55 PM

## 2018-07-20 MED FILL — predniSONE 5 MG TABS: 5 | 30 days supply | Qty: 60 | Fill #4

## 2018-07-20 MED FILL — ABIRATERONE ACETATE 250 MG: 250 | 30 days supply | Qty: 120 | Fill #0

## 2018-07-26 DIAGNOSIS — I482 Chronic atrial fibrillation: Secondary | ICD-10-CM | POA: Diagnosis not present

## 2018-08-04 ENCOUNTER — Other Ambulatory Visit: Payer: Self-pay | Admitting: Oncology

## 2018-08-04 DIAGNOSIS — C61 Malignant neoplasm of prostate: Secondary | ICD-10-CM

## 2018-08-08 MED FILL — ABIRATERONE ACETATE 250 MG: 250 | 30 days supply | Qty: 120 | Fill #0

## 2018-08-10 ENCOUNTER — Inpatient Hospital Stay: Payer: PPO

## 2018-08-10 ENCOUNTER — Other Ambulatory Visit: Payer: PPO

## 2018-08-10 ENCOUNTER — Encounter: Payer: Self-pay | Admitting: Oncology

## 2018-08-10 ENCOUNTER — Inpatient Hospital Stay (HOSPITAL_BASED_OUTPATIENT_CLINIC_OR_DEPARTMENT_OTHER): Payer: PPO | Admitting: Oncology

## 2018-08-10 ENCOUNTER — Ambulatory Visit: Payer: PPO | Admitting: Oncology

## 2018-08-10 VITALS — BP 138/85 | HR 87 | Temp 97.7°F | Resp 18 | Ht 72.84 in | Wt 254.0 lb

## 2018-08-10 DIAGNOSIS — M8588 Other specified disorders of bone density and structure, other site: Secondary | ICD-10-CM | POA: Diagnosis not present

## 2018-08-10 DIAGNOSIS — Z9079 Acquired absence of other genital organ(s): Secondary | ICD-10-CM

## 2018-08-10 DIAGNOSIS — Z79899 Other long term (current) drug therapy: Secondary | ICD-10-CM

## 2018-08-10 DIAGNOSIS — C61 Malignant neoplasm of prostate: Secondary | ICD-10-CM

## 2018-08-10 DIAGNOSIS — C775 Secondary and unspecified malignant neoplasm of intrapelvic lymph nodes: Secondary | ICD-10-CM

## 2018-08-10 DIAGNOSIS — I482 Chronic atrial fibrillation: Secondary | ICD-10-CM | POA: Diagnosis not present

## 2018-08-10 DIAGNOSIS — C779 Secondary and unspecified malignant neoplasm of lymph node, unspecified: Secondary | ICD-10-CM

## 2018-08-10 LAB — CBC WITH DIFFERENTIAL/PLATELET
Basophils Absolute: 0 10*3/uL (ref 0–0.1)
Basophils Relative: 1 %
EOS ABS: 0.1 10*3/uL (ref 0–0.7)
Eosinophils Relative: 2 %
HCT: 37.4 % — ABNORMAL LOW (ref 40.0–52.0)
Hemoglobin: 13 g/dL (ref 13.0–18.0)
LYMPHS ABS: 0.9 10*3/uL — AB (ref 1.0–3.6)
LYMPHS PCT: 22 %
MCH: 32.1 pg (ref 26.0–34.0)
MCHC: 34.8 g/dL (ref 32.0–36.0)
MCV: 92.3 fL (ref 80.0–100.0)
MONOS PCT: 9 %
Monocytes Absolute: 0.4 10*3/uL (ref 0.2–1.0)
Neutro Abs: 2.9 10*3/uL (ref 1.4–6.5)
Neutrophils Relative %: 66 %
Platelets: 111 10*3/uL — ABNORMAL LOW (ref 150–440)
RBC: 4.05 MIL/uL — AB (ref 4.40–5.90)
RDW: 15.5 % — ABNORMAL HIGH (ref 11.5–14.5)
WBC: 4.3 10*3/uL (ref 3.8–10.6)

## 2018-08-10 LAB — COMPREHENSIVE METABOLIC PANEL
ALBUMIN: 4.1 g/dL (ref 3.5–5.0)
ALT: 12 U/L (ref 0–44)
AST: 20 U/L (ref 15–41)
Alkaline Phosphatase: 61 U/L (ref 38–126)
Anion gap: 8 (ref 5–15)
BUN: 37 mg/dL — ABNORMAL HIGH (ref 8–23)
CHLORIDE: 100 mmol/L (ref 98–111)
CO2: 28 mmol/L (ref 22–32)
CREATININE: 1.06 mg/dL (ref 0.61–1.24)
Calcium: 9.2 mg/dL (ref 8.9–10.3)
GFR calc Af Amer: >60 mL/min (ref >60)
Glucose, Bld: 98 mg/dL (ref 70–99)
POTASSIUM: 3.1 mmol/L — AB (ref 3.5–5.1)
Sodium: 136 mmol/L (ref 135–145)
Total Bilirubin: 1.5 mg/dL — ABNORMAL HIGH (ref 0.3–1.2)
Total Protein: 6.4 g/dL — ABNORMAL LOW (ref 6.5–8.1)

## 2018-08-10 LAB — PSA: PROSTATIC SPECIFIC ANTIGEN: 10.19 ng/mL — AB (ref 0.00–4.00)

## 2018-08-10 MED ORDER — POTASSIUM CHLORIDE CRYS ER 20 MEQ PO TBCR: 40.0000 meq | EXTENDED_RELEASE_TABLET | Freq: Every day | ORAL | 0 refills | Status: DC

## 2018-08-10 MED ORDER — HEPARIN SOD (PORK) LOCK FLUSH 100 UNIT/ML IV SOLN
500.0000 [IU] | Freq: Once | INTRAVENOUS | Status: AC
  Administered 2018-08-10: 500 [IU] via INTRAVENOUS

## 2018-08-10 MED ORDER — SODIUM CHLORIDE 0.9% FLUSH
10.0000 mL | Freq: Once | INTRAVENOUS | Status: AC
  Administered 2018-08-10: 10 mL via INTRAVENOUS
  Filled 2018-08-10: qty 10

## 2018-08-10 MED FILL — predniSONE 5 MG TABS: 5 | 30 days supply | Qty: 60 | Fill #5

## 2018-08-10 NOTE — Progress Notes (Signed)
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 He had biochemical recurrence in 2017 and was treated with IM RT. He again began to have a rising PSA in 2010 and underwent orchiectomy in 2010.his PSA was being monitored closely PSA a year ago was 0.5, 5 months ago was 3.9 in 2 months ago was 7.8.he underwent CT abdomen and pelvis in May 2018which showed abnormal periaortic and right common iliac lymph nodes. Index aortocaval lymph node 1.4 cm which was new as compared to February 2014 and suspicious for malignancy. Bone scan did not reveal any evidence of bony metastatic disease. He has been referred to [place] for systemic therapy options for CRPC  Patient lives with his wife and is independent of his ADL's. He has A fib for which he is on coumadin. Also has chronic urinary incontinence from prior prostate surgery and has AUS. Reports 50 pound intentional weight loss since October 2017  Repeat PSA in July 2018 was 13.3 indicating PSA doubling time of 3 months  Docetaxel chemo initiated for CRPC with ln mets and rapid doubling PSA in august 2018.Patient completed 5 cycles of docetaxel on 09/27/2017. Cycle #6 was not given due to progressive fatigue and cytopenias as well as worsening neuropathy. Cycle 5 of docetaxel was dose reduced to 60 mg/m square  In April 2019 patient noted to have rising PSA and increase in the size of retroperitoneal LN. Patient therefore started second line  zytiga   Interval history-he is tolerating Zytiga well and reports no new complaints today he plans to go on 1-1/56-month trip to [facility] [place] and back in early September.  His appetite is good and he denies any unintentional weight loss.  He remains compliant with his medications.  Denies any leg swelling  ECOG PS- 1 Pain scale- 0 Opioid associated constipation- no  Review of systems- Review of Systems  Constitutional: Negative for chills, fever, malaise/fatigue and weight loss.  HENT: Negative for congestion, ear discharge and nosebleeds.   Eyes: Negative for blurred vision.  Respiratory: Negative for cough, hemoptysis, sputum production, shortness of breath and wheezing.   Cardiovascular: Negative for chest pain, palpitations, orthopnea and claudication.  Gastrointestinal: Negative for abdominal pain, blood in stool, constipation, diarrhea, heartburn, melena, nausea and vomiting.  Genitourinary: Negative for dysuria, flank pain, frequency, hematuria and urgency.  Musculoskeletal: Negative for back pain, joint pain and myalgias.  Skin: Negative for rash.  Neurological: Negative for dizziness, tingling, focal weakness, seizures, weakness and headaches.  Endo/Heme/Allergies: Does not bruise/bleed easily.  Psychiatric/Behavioral: Negative for depression and suicidal ideas. The patient does not have insomnia.       Allergies  Allergen Reactions  . Latex Rash    Sensitivity, not allergy.     Past Medical History:  Diagnosis Date  . Arthritis, degenerative 10/30/2015  . Atrial fibrillation ([place]) 10/30/2015  . Bladder spasm 06/12/2013  . Carcinoma of prostate ([place]) 10/30/2015  . Cardiomyopathy ([place]) 09/10/2014   Overview:  EF 35%,4/15   . Cerebrovascular accident (CVA) ([place]) 10/30/2015  Overview:  Left embolic PIR,5188   . Difficult or painful urination 11/21/2013  . Dysrhythmia   . Gout 10/30/2015  . History of kidney stones   . Malignant neoplasm of prostate  ([place]) 09/05/2012  . Sinoatrial node dysfunction (HCC) 10/30/2015   Overview:  DDD pacemaker, 1996      Past Surgical History:  Procedure Laterality Date  . castration  10/2009  . CATARACT EXTRACTION W/ INTRAOCULAR LENS  IMPLANT, BILATERAL Bilateral 2016   right eye done then the left one done 3 months later in 2016  . INGUINAL HERNIA REPAIR Left   . INSERT / REPLACE / REMOVE PACEMAKER    . IR FLUORO GUIDE PORT INSERTION RIGHT  07/01/2017  . JOINT REPLACEMENT Bilateral 1980   hips  . left knee replacement  2014  . PACEMAKER INSERTION    . PROSTATECTOMY    . REPLACEMENT TOTAL HIP W/  RESURFACING IMPLANTS  1997   bilateral-   . REPLACEMENT TOTAL KNEE Bilateral   . testes removal     per pt   . TONSILLECTOMY AND ADENOIDECTOMY    . TRANSURETHRAL RESECTION OF BLADDER TUMOR N/A 12/31/2015   Procedure: BLADDER BIOPSY;  Surgeon: Nickie Retort, MD;  Location: [facility] ORS;  Service: Urology;  Laterality: N/A;  . Urinary incontinence valve     AMS    Social History   Socioeconomic History  . Marital status: Married    Spouse name: Not on file  . Number of children: Not on file  . Years of education: Not on file  . Highest education level: Not on file  Occupational History  . Not on file  Social Needs  . Financial resource strain: Not on file  . Food insecurity:    Worry: Not on file    Inability: Not on file  . Transportation needs:    Medical: Not on file    Non-medical: Not on file  Tobacco Use  . Smoking status: Never Smoker  . Smokeless tobacco: Never Used  Substance and Sexual Activity  . Alcohol use: No    Alcohol/week: 0.0 standard drinks  . Drug use: No  . Sexual activity: Never  Lifestyle  . Physical activity:    Days per week: Not on file    Minutes per session: Not on file  . Stress: Not on file  Relationships  . Social connections:    Talks on phone: Not on file    Gets together: Not on file    Attends religious service: Not on file    Active member  of club or organization: Not on file    Attends meetings of clubs or organizations: Not on file    Relationship status: Not on file  . Intimate partner violence:    Fear of current or ex partner: Not on file    Emotionally abused: Not on file    Physically abused: Not on file    Forced sexual activity: Not on file  Other Topics Concern  . Not on file  Social History Narrative  . Not on file    Family History  Problem Relation Age of Onset  . Cancer Mother   . Arthritis Mother   . Heart attack Father   . Arthritis Father   . Transient ischemic attack Brother   . Prostate cancer Neg Hx   . Bladder Cancer Neg Hx      Current Outpatient Medications:  .  abiraterone acetate (ZYTIGA) 250 MG tablet, TAKE 4 TABLETS (1,000  MG TOTAL) BY MOUTH DAILY. TAKE ON AN EMPTY STOMACH 1 HOUR BEFORE OR 2 HOURS AFTER A MEAL, Disp: 120 tablet, Rfl: 0 .  acetaminophen (TYLENOL) 325 MG tablet, Take 650 mg by mouth 2 (two) times daily., Disp: , Rfl:  .  allopurinol (ZYLOPRIM) 300 MG tablet, Take by mouth daily. In am., Disp: , Rfl:  .  DULoxetine (CYMBALTA) 30 MG capsule, Take 1 capsule (30 mg total) daily by mouth., Disp: 60 capsule, Rfl: 3 .  Glucosamine Sulfate 1000 MG CAPS, Take 1 capsule by mouth 2 (two) times daily. , Disp: , Rfl:  .  hydrochlorothiazide (HYDRODIURIL) 25 MG tablet, Take 25 mg by mouth. In am, Disp: , Rfl:  .  phentermine 15 MG capsule, Take 15 mg by mouth every morning., Disp: , Rfl:  .  predniSONE (DELTASONE) 5 MG tablet, Take 1 tablet (5 mg total) by mouth 2 (two) times daily with a meal., Disp: 60 tablet, Rfl: 5 .  simvastatin (ZOCOR) 40 MG tablet, Take 40 mg by mouth daily at 6 PM. , Disp: , Rfl:  .  warfarin (COUMADIN) 2 MG tablet, Take 2 mg by mouth 2 (two) times a week. tues and thurs, Disp: , Rfl:  .  warfarin (COUMADIN) 5 MG tablet, TAKE ONE (1) TABLET EACH DAY on mon, wed, fri, sat, sun, Disp: , Rfl:  .  lidocaine-prilocaine (EMLA) cream, Apply small amount over port site  for port flushes (Patient not taking: Reported on 08/10/2018), Disp: 30 g, Rfl: 3 .  LORazepam (ATIVAN) 0.5 MG tablet, Take 1 tablet (0.5 mg total) by mouth every 6 (six) hours as needed (Nausea or vomiting). (Patient not taking: Reported on 02/28/2018), Disp: 30 tablet, Rfl: 0 .  ondansetron (ZOFRAN) 8 MG tablet, Take 1 tablet (8 mg total) by mouth 2 (two) times daily as needed for refractory nausea / vomiting. (Patient not taking: Reported on 02/28/2018), Disp: 30 tablet, Rfl: 1 .  potassium chloride SA (K-DUR,KLOR-CON) 20 MEQ tablet, Take 2 tablets (40 mEq total) by mouth daily for 4 days., Disp: 8 tablet, Rfl: 0 .  prochlorperazine (COMPAZINE) 10 MG tablet, Take 1 tablet (10 mg total) by mouth every 6 (six) hours as needed (Nausea or vomiting). (Patient not taking: Reported on 02/28/2018), Disp: 30 tablet, Rfl: 1  Current Facility-Administered Medications:  .  lidocaine (XYLOCAINE) 2 % jelly 1 application, 1 application, Urethral, Once, Nickie Retort, MD  Physical exam:  Vitals:   08/10/18 0957  BP: 138/85  Pulse: 87  Resp: 18  Temp: 97.7 F (36.5 C)  TempSrc: Tympanic  SpO2: 100%  Weight: 254 lb (115.2 kg)  Height: 6' 0.84" (1.85 m)   Physical Exam  Constitutional: He is oriented to person, place, and time.  Patient is obese.  Does not appear to be in any acute distress  HENT:  Head: Normocephalic and atraumatic.  Eyes: Pupils are equal, round, and reactive to light. EOM are normal.  Neck: Normal range of motion.  Cardiovascular: Normal rate, regular rhythm and normal heart sounds.  Pulmonary/Chest: Effort normal and breath sounds normal.  Abdominal: Soft. Bowel sounds are normal.  Neurological: He is alert and oriented to person, place, and time.  Skin: Skin is warm and dry.     CMP Latest Ref Rng & Units 08/10/2018  Glucose 70 - 99 mg/dL 98  BUN 8 - 23 mg/dL 37(H)  Creatinine 0.61 - 1.24 mg/dL 1.06  Sodium 135 - 145 mmol/L 136  Potassium 3.5 - 5.1 mmol/L 3.1(L)  Chloride 98 - 111 mmol/L 100  CO2 22 - 32 mmol/L 28  Calcium 8.9 - 10.3 mg/dL 9.2  Total Protein 6.5 - 8.1 g/dL 6.4(L)  Total Bilirubin 0.3 - 1.2 mg/dL 1.5(H)  Alkaline Phos 38 - 126 U/L 61  AST 15 - 41 U/L 20  ALT 0 - 44 U/L 12   CBC Latest Ref Rng & Units 08/10/2018  WBC 3.8 - 10.6 K/uL 4.3  Hemoglobin 13.0 - 18.0 g/dL 13.0  Hematocrit 40.0 - 52.0 % 37.4(L)  Platelets 150 - 440 K/uL 111(L)      Assessment and plan- Patient is a 72 y.o. male with CRPC s/p b/l orchiectomy currently on zytiga. He had disease progression on docetaxel.  He is here for routine follow-up of prostate cancer on Zytiga  Patient is tolerating Zytiga well so far.  His PSA is slowly trending down although it has not normalized.  His PSA trend is as follows  Component     Latest Ref Rng & Units 11/15/2017 02/28/2018 03/23/2018 04/25/2018  PSA     0.0 - 4.0 ng/mL      Prostate Specific Ag, Serum     0.0 - 4.0 ng/mL      Prostatic Specific Antigen     0.00 - 4.00 ng/mL 5.64 (H) 15.74 (H) 19.02 (H) 16.46 (H)   Component     Latest Ref Rng & Units 05/22/2018 06/19/2018 07/17/2018  PSA     0.0 - 4.0 ng/mL     Prostate Specific Ag, Serum     0.0 - 4.0 ng/mL     Prostatic Specific Antigen     0.00 - 4.00 ng/mL 16.61 (H) 14.82 (H) 13.85 (H)    Recent scans showed decrease in the size of retroperitoneal adenopathy.  Possible new sclerotic lesion in the left scapula.  He does not have any other evidence of metastatic disease.  Plan is to continue Zytiga and prednisone at this time.  He is already status post bilateral orchiectomy and therefore not on Lupron.  I will see him back after he returns back from vacation in late October 2019 with a CBC CMP and a PSA.    His bilirubin has been waxing and waning between 1.3-1.5 which we will continue to monitor.  AST and ALT have been normal.  He has not had significantly elevated bilirubin prior to starting Zytiga.  Continue to monitor  I will plan to get repeat CT chest  abdomen pelvis and bone scan in late October 2019.  If there is further evidence of bony metastases I will consider adding bisphosphonates at that time for castrate resistant prostate cancer.  He does not have any evidence of overt bone metastases except for the left scapular lesion which we will continue to monitor   Visit Diagnosis 1. Malignant neoplasm of prostate ([place])   2. Regional lymph node metastasis present [facility])      Dr. Randa Evens, MD, MPH [facility] at [facility] [phone] 08/10/2018 2:25 PM

## 2018-08-10 NOTE — Progress Notes (Signed)
No new changes noted today 

## 2018-09-13 ENCOUNTER — Other Ambulatory Visit: Payer: Self-pay | Admitting: Oncology

## 2018-09-13 DIAGNOSIS — C61 Malignant neoplasm of prostate: Secondary | ICD-10-CM

## 2018-09-18 MED FILL — ABIRATERONE ACETATE 250 MG: 250 | 30 days supply | Qty: 120 | Fill #0

## 2018-09-19 DIAGNOSIS — I480 Paroxysmal atrial fibrillation: Secondary | ICD-10-CM | POA: Diagnosis not present

## 2018-09-28 ENCOUNTER — Inpatient Hospital Stay (HOSPITAL_BASED_OUTPATIENT_CLINIC_OR_DEPARTMENT_OTHER): Payer: PPO | Admitting: Oncology

## 2018-09-28 ENCOUNTER — Inpatient Hospital Stay: Payer: PPO | Attending: Oncology

## 2018-09-28 ENCOUNTER — Encounter: Payer: Self-pay | Admitting: Oncology

## 2018-09-28 VITALS — BP 150/85 | HR 60 | Temp 97.5°F | Resp 18 | Ht 72.84 in | Wt 255.9 lb

## 2018-09-28 DIAGNOSIS — C61 Malignant neoplasm of prostate: Secondary | ICD-10-CM

## 2018-09-28 DIAGNOSIS — Z9221 Personal history of antineoplastic chemotherapy: Secondary | ICD-10-CM | POA: Insufficient documentation

## 2018-09-28 DIAGNOSIS — Z79818 Long term (current) use of other agents affecting estrogen receptors and estrogen levels: Secondary | ICD-10-CM

## 2018-09-28 DIAGNOSIS — C775 Secondary and unspecified malignant neoplasm of intrapelvic lymph nodes: Secondary | ICD-10-CM | POA: Diagnosis not present

## 2018-09-28 DIAGNOSIS — Z9079 Acquired absence of other genital organ(s): Secondary | ICD-10-CM

## 2018-09-28 DIAGNOSIS — I482 Chronic atrial fibrillation, unspecified: Secondary | ICD-10-CM | POA: Diagnosis not present

## 2018-09-28 DIAGNOSIS — Z95828 Presence of other vascular implants and grafts: Secondary | ICD-10-CM

## 2018-09-28 DIAGNOSIS — Z79899 Other long term (current) drug therapy: Secondary | ICD-10-CM

## 2018-09-28 DIAGNOSIS — C779 Secondary and unspecified malignant neoplasm of lymph node, unspecified: Secondary | ICD-10-CM

## 2018-09-28 LAB — COMPREHENSIVE METABOLIC PANEL
ALK PHOS: 75 U/L (ref 38–126)
ALT: 11 U/L (ref 0–44)
ANION GAP: 10 (ref 5–15)
AST: 21 U/L (ref 15–41)
Albumin: 4.5 g/dL (ref 3.5–5.0)
BUN: 34 mg/dL — ABNORMAL HIGH (ref 8–23)
CALCIUM: 10 mg/dL (ref 8.9–10.3)
CO2: 27 mmol/L (ref 22–32)
CREATININE: 1.31 mg/dL — AB (ref 0.61–1.24)
Chloride: 100 mmol/L (ref 98–111)
GFR calc Af Amer: >60 mL/min (ref >60)
GFR, EST NON AFRICAN AMERICAN: 54 mL/min — AB (ref >60)
Glucose, Bld: 125 mg/dL — ABNORMAL HIGH (ref 70–99)
Potassium: 3.4 mmol/L — ABNORMAL LOW (ref 3.5–5.1)
Sodium: 137 mmol/L (ref 135–145)
Total Bilirubin: 1.9 mg/dL — ABNORMAL HIGH (ref 0.3–1.2)
Total Protein: 7 g/dL (ref 6.5–8.1)

## 2018-09-28 LAB — PSA: PROSTATIC SPECIFIC ANTIGEN: 8.82 ng/mL — AB (ref 0.00–4.00)

## 2018-09-28 LAB — CBC WITH DIFFERENTIAL/PLATELET
Abs Immature Granulocytes: 0.03 10*3/uL (ref 0.00–0.07)
BASOS PCT: 0 %
Basophils Absolute: 0 10*3/uL (ref 0.0–0.1)
EOS ABS: 0.2 10*3/uL (ref 0.0–0.5)
EOS PCT: 2 %
HEMATOCRIT: 40.7 % (ref 39.0–52.0)
HEMOGLOBIN: 14 g/dL (ref 13.0–17.0)
Immature Granulocytes: 0 %
Lymphocytes Relative: 14 %
Lymphs Abs: 1.2 10*3/uL (ref 0.7–4.0)
MCH: 31.6 pg (ref 26.0–34.0)
MCHC: 34.4 g/dL (ref 30.0–36.0)
MCV: 91.9 fL (ref 80.0–100.0)
Monocytes Absolute: 0.8 10*3/uL (ref 0.1–1.0)
Monocytes Relative: 9 %
NRBC: 0 % (ref 0.0–0.2)
Neutro Abs: 6.4 10*3/uL (ref 1.7–7.7)
Neutrophils Relative %: 75 %
Platelets: 143 10*3/uL — ABNORMAL LOW (ref 150–400)
RBC: 4.43 MIL/uL (ref 4.22–5.81)
RDW: 13.7 % (ref 11.5–15.5)
WBC: 8.5 10*3/uL (ref 4.0–10.5)

## 2018-09-28 MED ORDER — HEPARIN SOD (PORK) LOCK FLUSH 100 UNIT/ML IV SOLN
500.0000 [IU] | Freq: Once | INTRAVENOUS | Status: AC
  Administered 2018-09-28: 500 [IU] via INTRAVENOUS
  Filled 2018-09-28: qty 5

## 2018-09-28 MED ORDER — SODIUM CHLORIDE 0.9% FLUSH
10.0000 mL | Freq: Once | INTRAVENOUS | Status: AC
  Administered 2018-09-28: 10 mL via INTRAVENOUS
  Filled 2018-09-28: qty 10

## 2018-09-28 NOTE — Progress Notes (Signed)
No new changes noted today 

## 2018-10-01 NOTE — Progress Notes (Signed)
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 He had biochemical recurrence in 2017 and was treated with IM RT. He again began to have a rising PSA in 2010 and underwent orchiectomy in 2010.his PSA was being monitored closely PSA a year ago was 0.5, 5 months ago was 3.9 in 2 months ago was 7.8.he underwent CT abdomen and pelvis in May 2018which showed abnormal periaortic and right common iliac lymph nodes. Index aortocaval lymph node 1.4 cm which was new as compared to February 2014 and suspicious for malignancy. Bone scan did not reveal any evidence of bony metastatic disease. He has been referred to [place] for systemic therapy options for CRPC  Patient lives with his wife and is independent of his ADL's. He has A fib for which he is on coumadin. Also has chronic urinary incontinence from prior prostate surgery and has AUS. Reports 50 pound intentional weight loss since October 2017  Repeat PSA in July 2018 was 13.3 indicating PSA doubling time of 3 months  Docetaxel chemo initiated for CRPC with ln mets and rapid doubling PSA in august 2018.Patient completed 5 cycles of docetaxel on 09/27/2017. Cycle #6 was not given due to progressive fatigue and cytopenias as well as worsening neuropathy. Cycle 5 of docetaxel was dose reduced to 60 mg/m square  In April 2019 patient noted to have rising PSA and increase in the size of retroperitoneal LN. Patient therefore started second line  zytiga   Interval history- Patient went on long distance trip across [place] recently and had no issues during the trip. Neuropathy is stable. No recent falls. Appetite is good. Reports no new areas of pain. Denies any specific side effects from zytiga  ECOG PS- 1 Pain scale- 0  Review of systems- Review of Systems  Constitutional: Positive for malaise/fatigue. Negative for chills, fever and weight loss.  HENT: Negative for congestion, ear discharge and nosebleeds.   Eyes: Negative for blurred vision.  Respiratory: Negative for cough, hemoptysis, sputum production, shortness of breath and wheezing.   Cardiovascular: Negative for chest pain, palpitations, orthopnea and claudication.  Gastrointestinal: Negative for abdominal pain, blood in stool, constipation, diarrhea, heartburn, melena, nausea and vomiting.  Genitourinary: Negative for dysuria, flank pain, frequency, hematuria and urgency.  Musculoskeletal: Negative for back pain, joint pain and myalgias.  Skin: Negative for rash.  Neurological: Negative for dizziness, tingling, focal weakness, seizures, weakness and headaches.  Endo/Heme/Allergies: Does not bruise/bleed easily.  Psychiatric/Behavioral: Negative for depression and suicidal ideas. The patient does not have insomnia.       Allergies  Allergen Reactions  . Latex Rash    Sensitivity, not allergy.     Past Medical History:  Diagnosis Date  . Arthritis, degenerative 10/30/2015  . Atrial fibrillation ([place]) 10/30/2015  . Bladder spasm 06/12/2013  . Carcinoma of prostate ([place]) 10/30/2015  . Cardiomyopathy ([place]) 09/10/2014   Overview:  EF 35%,4/15   . Cerebrovascular accident (CVA) ([place]) 10/30/2015   Overview:  Left embolic QMG,8676   . Difficult or painful urination  11/21/2013  . Dysrhythmia   . Gout 10/30/2015  . History of kidney stones   . Malignant neoplasm of prostate ([place]) 09/05/2012  . Sinoatrial node dysfunction (HCC) 10/30/2015   Overview:  DDD pacemaker,  1996      Past Surgical History:  Procedure Laterality Date  . castration  10/2009  . CATARACT EXTRACTION W/ INTRAOCULAR LENS  IMPLANT, BILATERAL Bilateral 2016   right eye done then the left one done 3 months later in 2016  . INGUINAL HERNIA REPAIR Left   . INSERT / REPLACE / REMOVE PACEMAKER    . IR FLUORO GUIDE PORT INSERTION RIGHT  07/01/2017  . JOINT REPLACEMENT Bilateral 1980   hips  . left knee replacement  2014  . PACEMAKER INSERTION    . PROSTATECTOMY    . REPLACEMENT TOTAL HIP W/  RESURFACING IMPLANTS  1997   bilateral-   . REPLACEMENT TOTAL KNEE Bilateral   . testes removal     per pt   . TONSILLECTOMY AND ADENOIDECTOMY    . TRANSURETHRAL RESECTION OF BLADDER TUMOR N/A 12/31/2015   Procedure: BLADDER BIOPSY;  Surgeon: Nickie Retort, MD;  Location: [facility] ORS;  Service: Urology;  Laterality: N/A;  . Urinary incontinence valve     AMS    Social History   Socioeconomic History  . Marital status: Married    Spouse name: Not on file  . Number of children: Not on file  . Years of education: Not on file  . Highest education level: Not on file  Occupational History  . Not on file  Social Needs  . Financial resource strain: Not on file  . Food insecurity:    Worry: Not on file    Inability: Not on file  . Transportation needs:    Medical: Not on file    Non-medical: Not on file  Tobacco Use  . Smoking status: Never Smoker  . Smokeless tobacco: Never Used  Substance and Sexual Activity  . Alcohol use: No    Alcohol/week: 0.0 standard drinks  . Drug use: No  . Sexual activity: Never  Lifestyle  . Physical activity:    Days per week: Not on file    Minutes per session: Not on file  . Stress: Not on file  Relationships  . Social connections:    Talks on phone: Not on file    Gets together: Not on file    Attends religious service: Not on file    Active member of club or organization: Not on file    Attends meetings of clubs or organizations: Not on  file    Relationship status: Not on file  . Intimate partner violence:    Fear of current or ex partner: Not on file    Emotionally abused: Not on file    Physically abused: Not on file    Forced sexual activity: Not on file  Other Topics Concern  . Not on file  Social History Narrative  . Not on file    Family History  Problem Relation Age of Onset  . Cancer Mother   . Arthritis Mother   . Heart attack Father   . Arthritis Father   . Transient ischemic attack Brother   . Prostate cancer Neg Hx   . Bladder Cancer Neg Hx      Current Outpatient Medications:  .  abiraterone acetate (ZYTIGA) 250 MG tablet, TAKE 4 TABLETS (1,000 MG TOTAL) BY MOUTH DAILY. TAKE ON AN EMPTY STOMACH 1 HOUR  BEFORE OR 2 HOURS AFTER A MEAL, Disp: 120 tablet, Rfl: 3 .  allopurinol (ZYLOPRIM) 300 MG tablet, Take by mouth daily. In am., Disp: , Rfl:  .  hydrochlorothiazide (HYDRODIURIL) 25 MG tablet, Take 25 mg by mouth. In am, Disp: , Rfl:  .  predniSONE (DELTASONE) 5 MG tablet, TAKE 1 TABLET (5 MG TOTAL) BY MOUTH 2 TIMES DAILY WITH A MEAL., Disp: 60 tablet, Rfl: 5 .  simvastatin (ZOCOR) 40 MG tablet, Take 40 mg by mouth daily at 6 PM. , Disp: , Rfl:  .  warfarin (COUMADIN) 2 MG tablet, Take 2 mg by mouth 2 (two) times a week. tues and thurs, Disp: , Rfl:  .  warfarin (COUMADIN) 5 MG tablet, TAKE ONE (1) TABLET EACH DAY on mon, wed, fri, sat, sun, Disp: , Rfl:  .  acetaminophen (TYLENOL) 325 MG tablet, Take 650 mg by mouth 2 (two) times daily., Disp: , Rfl:  .  DULoxetine (CYMBALTA) 30 MG capsule, Take 1 capsule (30 mg total) daily by mouth. (Patient not taking: Reported on 09/28/2018), Disp: 60 capsule, Rfl: 3 .  Glucosamine Sulfate 1000 MG CAPS, Take 1 capsule by mouth 2 (two) times daily. , Disp: , Rfl:  .  lidocaine-prilocaine (EMLA) cream, Apply small amount over port site for port flushes (Patient not taking: Reported on 08/10/2018), Disp: 30 g, Rfl: 3 .  LORazepam (ATIVAN) 0.5 MG tablet, Take 1 tablet  (0.5 mg total) by mouth every 6 (six) hours as needed (Nausea or vomiting). (Patient not taking: Reported on 02/28/2018), Disp: 30 tablet, Rfl: 0 .  ondansetron (ZOFRAN) 8 MG tablet, Take 1 tablet (8 mg total) by mouth 2 (two) times daily as needed for refractory nausea / vomiting. (Patient not taking: Reported on 02/28/2018), Disp: 30 tablet, Rfl: 1 .  phentermine 15 MG capsule, Take 15 mg by mouth every morning., Disp: , Rfl:  .  potassium chloride SA (K-DUR,KLOR-CON) 20 MEQ tablet, Take 2 tablets (40 mEq total) by mouth daily for 4 days., Disp: 8 tablet, Rfl: 0 .  prochlorperazine (COMPAZINE) 10 MG tablet, Take 1 tablet (10 mg total) by mouth every 6 (six) hours as needed (Nausea or vomiting). (Patient not taking: Reported on 02/28/2018), Disp: 30 tablet, Rfl: 1  Current Facility-Administered Medications:  .  lidocaine (XYLOCAINE) 2 % jelly 1 application, 1 application, Urethral, Once, Nickie Retort, MD  Physical exam:  Vitals:   09/28/18 1422 09/28/18 1427 09/28/18 1544  BP: (!) 150/85  (!) 150/85  Pulse: (!) 40 60   Resp: 18    Temp: (!) 97.5 F (36.4 C)    SpO2:  98%   Weight: 255 lb 14.4 oz (116.1 kg)    Height: 6' 0.84" (1.85 m)     Physical Exam  Constitutional: He is oriented to person, place, and time.  Patient is obese. No acute distress  HENT:  Head: Normocephalic and atraumatic.  Eyes: Pupils are equal, round, and reactive to light. EOM are normal.  Neck: Normal range of motion.  Cardiovascular: Normal rate, regular rhythm and normal heart sounds.  Pulmonary/Chest: Effort normal and breath sounds normal.  Abdominal: Soft. Bowel sounds are normal.  Musculoskeletal: He exhibits edema (trace b/l chronic).  Neurological: He is alert and oriented to person, place, and time.  Skin: Skin is warm and dry.     CMP Latest Ref Rng & Units 09/28/2018  Glucose 70 - 99 mg/dL 125(H)  BUN 8 - 23 mg/dL 34(H)  Creatinine 0.61 - 1.24 mg/dL  1.31(H)  Sodium 135 - 145 mmol/L 137    Potassium 3.5 - 5.1 mmol/L 3.4(L)  Chloride 98 - 111 mmol/L 100  CO2 22 - 32 mmol/L 27  Calcium 8.9 - 10.3 mg/dL 10.0  Total Protein 6.5 - 8.1 g/dL 7.0  Total Bilirubin 0.3 - 1.2 mg/dL 1.9(H)  Alkaline Phos 38 - 126 U/L 75  AST 15 - 41 U/L 21  ALT 0 - 44 U/L 11   CBC Latest Ref Rng & Units 09/28/2018  WBC 4.0 - 10.5 K/uL 8.5  Hemoglobin 13.0 - 17.0 g/dL 14.0  Hematocrit 39.0 - 52.0 % 40.7  Platelets 150 - 400 K/uL 143(L)    Assessment and plan- Patient is a 72 y.o. male with CRPC and low volume retropertyioneal LN mets now on second line zytiga  Patient has repeat CT and bone scan scheduled in 2 weeks. PSA is also trending down.He was noted to have new scelrotic focus on prior bone scan which we will monitor  I will see him back in 4 weeks with cbc/cmp and PSA. He will continue zytiga and prednisone.   Visit Diagnosis 1. Malignant neoplasm of prostate ([place])   2. High risk medication use      Dr. Randa Evens, MD, MPH [facility] at [facility] [phone] 10/01/2018 11:11 AM

## 2018-10-02 MED FILL — predniSONE 5 MG TABS: 5 | 30 days supply | Qty: 60 | Fill #0

## 2018-10-11 ENCOUNTER — Ambulatory Visit
Admission: RE | Admit: 2018-10-11 | Discharge: 2018-10-11 | Disposition: A | Discharge status: Home / Self Care | Payer: PPO | Source: Ambulatory Visit | Attending: Oncology | Admitting: Oncology

## 2018-10-11 ENCOUNTER — Inpatient Hospital Stay: Payer: PPO

## 2018-10-11 ENCOUNTER — Other Ambulatory Visit: Payer: Self-pay

## 2018-10-11 DIAGNOSIS — I700 Atherosclerosis of aorta: Secondary | ICD-10-CM | POA: Diagnosis not present

## 2018-10-11 DIAGNOSIS — C7951 Secondary malignant neoplasm of bone: Secondary | ICD-10-CM | POA: Diagnosis not present

## 2018-10-11 DIAGNOSIS — I2510 Atherosclerotic heart disease of native coronary artery without angina pectoris: Secondary | ICD-10-CM | POA: Insufficient documentation

## 2018-10-11 DIAGNOSIS — C779 Secondary and unspecified malignant neoplasm of lymph node, unspecified: Secondary | ICD-10-CM | POA: Diagnosis not present

## 2018-10-11 DIAGNOSIS — K5730 Diverticulosis of large intestine without perforation or abscess without bleeding: Secondary | ICD-10-CM | POA: Insufficient documentation

## 2018-10-11 DIAGNOSIS — C61 Malignant neoplasm of prostate: Secondary | ICD-10-CM

## 2018-10-11 DIAGNOSIS — N62 Hypertrophy of breast: Secondary | ICD-10-CM | POA: Insufficient documentation

## 2018-10-11 LAB — COMPREHENSIVE METABOLIC PANEL
ALK PHOS: 67 U/L (ref 38–126)
ALT: 11 U/L (ref 0–44)
AST: 20 U/L (ref 15–41)
Albumin: 4 g/dL (ref 3.5–5.0)
Anion gap: 10 (ref 5–15)
BILIRUBIN TOTAL: 1.4 mg/dL — AB (ref 0.3–1.2)
BUN: 33 mg/dL — AB (ref 8–23)
CALCIUM: 9.1 mg/dL (ref 8.9–10.3)
CO2: 28 mmol/L (ref 22–32)
Chloride: 99 mmol/L (ref 98–111)
Creatinine, Ser: 1.09 mg/dL (ref 0.61–1.24)
GFR calc Af Amer: >60 mL/min (ref >60)
GFR calc non Af Amer: >60 mL/min (ref >60)
Glucose, Bld: 83 mg/dL (ref 70–99)
POTASSIUM: 3.4 mmol/L — AB (ref 3.5–5.1)
Sodium: 137 mmol/L (ref 135–145)
TOTAL PROTEIN: 6.4 g/dL — AB (ref 6.5–8.1)

## 2018-10-11 MED ORDER — TECHNETIUM TC 99M MEDRONATE IV KIT
22.8950 | PACK | Freq: Once | INTRAVENOUS | Status: AC | PRN
  Administered 2018-10-11: 22.895 via INTRAVENOUS

## 2018-10-11 MED ORDER — IOPAMIDOL (ISOVUE-300) INJECTION 61%
100.0000 mL | Freq: Once | INTRAVENOUS | Status: AC | PRN
  Administered 2018-10-11: 100 mL via INTRAVENOUS

## 2018-10-16 MED FILL — ABIRATERONE ACETATE 250 MG: 250 | 30 days supply | Qty: 120 | Fill #1

## 2018-10-26 ENCOUNTER — Inpatient Hospital Stay: Payer: PPO

## 2018-10-26 ENCOUNTER — Encounter: Payer: Self-pay | Admitting: Oncology

## 2018-10-26 ENCOUNTER — Inpatient Hospital Stay: Payer: PPO | Attending: Oncology | Admitting: Oncology

## 2018-10-26 ENCOUNTER — Other Ambulatory Visit: Payer: Self-pay

## 2018-10-26 VITALS — BP 137/89 | HR 55 | Temp 97.9°F | Resp 18 | Ht 72.84 in | Wt 256.9 lb

## 2018-10-26 DIAGNOSIS — Z950 Presence of cardiac pacemaker: Secondary | ICD-10-CM | POA: Insufficient documentation

## 2018-10-26 DIAGNOSIS — R945 Abnormal results of liver function studies: Secondary | ICD-10-CM | POA: Diagnosis not present

## 2018-10-26 DIAGNOSIS — R7989 Other specified abnormal findings of blood chemistry: Secondary | ICD-10-CM

## 2018-10-26 DIAGNOSIS — Z79899 Other long term (current) drug therapy: Secondary | ICD-10-CM

## 2018-10-26 DIAGNOSIS — I4891 Unspecified atrial fibrillation: Secondary | ICD-10-CM | POA: Insufficient documentation

## 2018-10-26 DIAGNOSIS — Z7901 Long term (current) use of anticoagulants: Secondary | ICD-10-CM | POA: Insufficient documentation

## 2018-10-26 DIAGNOSIS — M109 Gout, unspecified: Secondary | ICD-10-CM | POA: Diagnosis not present

## 2018-10-26 DIAGNOSIS — C61 Malignant neoplasm of prostate: Secondary | ICD-10-CM | POA: Diagnosis not present

## 2018-10-26 LAB — CBC WITH DIFFERENTIAL/PLATELET
Abs Immature Granulocytes: 0.01 10*3/uL (ref 0.00–0.07)
BASOS ABS: 0 10*3/uL (ref 0.0–0.1)
Basophils Relative: 0 %
Eosinophils Absolute: 0.1 10*3/uL (ref 0.0–0.5)
Eosinophils Relative: 1 %
HEMATOCRIT: 38.5 % — AB (ref 39.0–52.0)
Hemoglobin: 12.9 g/dL — ABNORMAL LOW (ref 13.0–17.0)
IMMATURE GRANULOCYTES: 0 %
LYMPHS ABS: 1.1 10*3/uL (ref 0.7–4.0)
LYMPHS PCT: 21 %
MCH: 31.5 pg (ref 26.0–34.0)
MCHC: 33.5 g/dL (ref 30.0–36.0)
MCV: 93.9 fL (ref 80.0–100.0)
Monocytes Absolute: 0.4 10*3/uL (ref 0.1–1.0)
Monocytes Relative: 8 %
NEUTROS PCT: 70 %
NRBC: 0 % (ref 0.0–0.2)
Neutro Abs: 3.5 10*3/uL (ref 1.7–7.7)
Platelets: 128 10*3/uL — ABNORMAL LOW (ref 150–400)
RBC: 4.1 MIL/uL — ABNORMAL LOW (ref 4.22–5.81)
RDW: 14 % (ref 11.5–15.5)
WBC: 5.1 10*3/uL (ref 4.0–10.5)

## 2018-10-26 LAB — COMPREHENSIVE METABOLIC PANEL
ALBUMIN: 4.6 g/dL (ref 3.5–5.0)
ALT: 12 U/L (ref 0–44)
ANION GAP: 11 (ref 5–15)
AST: 23 U/L (ref 15–41)
Alkaline Phosphatase: 71 U/L (ref 38–126)
BUN: 28 mg/dL — ABNORMAL HIGH (ref 8–23)
CHLORIDE: 99 mmol/L (ref 98–111)
CO2: 28 mmol/L (ref 22–32)
Calcium: 9.5 mg/dL (ref 8.9–10.3)
Creatinine, Ser: 1.33 mg/dL — ABNORMAL HIGH (ref 0.61–1.24)
GFR calc Af Amer: >60 mL/min (ref >60)
GFR calc non Af Amer: 53 mL/min — ABNORMAL LOW (ref >60)
Glucose, Bld: 107 mg/dL — ABNORMAL HIGH (ref 70–99)
Potassium: 3.7 mmol/L (ref 3.5–5.1)
SODIUM: 138 mmol/L (ref 135–145)
Total Bilirubin: 2 mg/dL — ABNORMAL HIGH (ref 0.3–1.2)
Total Protein: 6.9 g/dL (ref 6.5–8.1)

## 2018-10-26 LAB — PSA: Prostatic Specific Antigen: 8.07 ng/mL — ABNORMAL HIGH (ref 0.00–4.00)

## 2018-10-26 NOTE — Progress Notes (Signed)
No new changes noted today 

## 2018-10-27 NOTE — Progress Notes (Signed)
Hematology/Oncology Consult note 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 He had biochemical recurrence in 2017 and was treated with IM RT. He again began to have a rising PSA in 2010 and underwent orchiectomy in 2010.his PSA was being monitored closely PSA a year ago was 0.5, 5 months ago was 3.9 in 2 months ago was 7.8.he underwent CT abdomen and pelvis in May 2018which showed abnormal periaortic and right common iliac lymph nodes. Index aortocaval lymph node 1.4 cm which was new as compared to February 2014 and suspicious for malignancy. Bone scan did not reveal any evidence of bony metastatic disease. He has been referred to [place] for systemic therapy options for CRPC  Patient lives with his wife and is independent of his ADL's. He has A fib for which he is on coumadin. Also has chronic urinary incontinence from prior prostate surgery and has AUS. Reports 50 pound intentional weight loss since October 2017  Repeat PSA in July 2018 was 13.3 indicating PSA doubling time of 3 months  Docetaxel chemo initiated for CRPC with ln mets and rapid doubling PSA in august 2018.Patient completed 5 cycles of docetaxel on 09/27/2017. Cycle #6 was not given due to progressive fatigue and cytopenias as well as worsening neuropathy. Cycle 5 of docetaxel was dose reduced to 60 mg/m square  In April 2019 patient noted to have rising PSA and increase in the size of retroperitoneal LN. Patient therefore started  second line zytiga   Interval history-patient is tolerating his Zytiga well without any significant side effects.  His appetite is good and he denies any unintentional weight loss.  Denies any aches or pains anywhere  ECOG PS- 1 Pain scale- 0 Opioid associated constipation- no  Review of systems- Review of Systems  Constitutional: Negative for chills, fever, malaise/fatigue and weight loss.  HENT: Negative for congestion, ear discharge and nosebleeds.   Eyes: Negative for blurred vision.  Respiratory: Negative for cough, hemoptysis, sputum production, shortness of breath and wheezing.   Cardiovascular: Negative for chest pain, palpitations, orthopnea and claudication.  Gastrointestinal: Negative for abdominal pain, blood in stool, constipation, diarrhea, heartburn, melena, nausea and vomiting.  Genitourinary: Negative for dysuria, flank pain, frequency, hematuria and urgency.  Musculoskeletal: Negative for back pain, joint pain and myalgias.  Skin: Negative for rash.  Neurological: Negative for dizziness, tingling, focal weakness, seizures, weakness and headaches.  Endo/Heme/Allergies: Does not bruise/bleed easily.  Psychiatric/Behavioral: Negative for depression and suicidal ideas. The patient does not have insomnia.       Allergies  Allergen Reactions  . Latex Rash    Sensitivity, not allergy.     Past Medical History:  Diagnosis Date  . Arthritis, degenerative 10/30/2015  . Atrial fibrillation ([place]) 10/30/2015  . Bladder spasm 06/12/2013  . Carcinoma of prostate ([place]) 10/30/2015  . Cardiomyopathy ([place]) 09/10/2014   Overview:  EF 35%,4/15   . Cerebrovascular accident (CVA) ([place]) 10/30/2015   Overview:  Left embolic [id number]   . Difficult or painful urination 11/21/2013  . Dysrhythmia   .  Gout 10/30/2015  . History of kidney stones   . Malignant neoplasm of prostate ([place]) 09/05/2012  . Sinoatrial node dysfunction ([facility]) 10/30/2015   Overview:  DDD pacemaker, 1996       Past Surgical History:  Procedure Laterality Date  . castration  10/2009  . CATARACT EXTRACTION W/ INTRAOCULAR LENS  IMPLANT, BILATERAL Bilateral 2016   right eye done then the left one done 3 months later in 2016  . INGUINAL HERNIA REPAIR Left   . INSERT / REPLACE / REMOVE PACEMAKER    . IR FLUORO GUIDE PORT INSERTION RIGHT  07/01/2017  . JOINT REPLACEMENT Bilateral 1980   hips  . left knee replacement  2014  . PACEMAKER INSERTION    . PROSTATECTOMY    . REPLACEMENT TOTAL HIP W/  RESURFACING IMPLANTS  1997   bilateral-   . REPLACEMENT TOTAL KNEE Bilateral   . testes removal     per pt   . TONSILLECTOMY AND ADENOIDECTOMY    . TRANSURETHRAL RESECTION OF BLADDER TUMOR N/A 12/31/2015   Procedure: BLADDER BIOPSY;  Surgeon: Nickie Retort, MD;  Location: [facility] ORS;  Service: Urology;  Laterality: N/A;  . Urinary incontinence valve     AMS    Social History   Socioeconomic History  . Marital status: Married    Spouse name: Not on file  . Number of children: Not on file  . Years of education: Not on file  . Highest education level: Not on file  Occupational History  . Not on file  Social Needs  . Financial resource strain: Not on file  . Food insecurity:    Worry: Not on file    Inability: Not on file  . Transportation needs:    Medical: Not on file    Non-medical: Not on file  Tobacco Use  . Smoking status: Never Smoker  . Smokeless tobacco: Never Used  Substance and Sexual Activity  . Alcohol use: No    Alcohol/week: 0.0 standard drinks  . Drug use: No  . Sexual activity: Never  Lifestyle  . Physical activity:    Days per week: Not on file    Minutes per session: Not on file  . Stress: Not on file  Relationships  . Social connections:    Talks on phone: Not on file    Gets together: Not on file    Attends religious service: Not on file    Active member of club or organization: Not on file    Attends meetings of clubs or organizations: Not on file     Relationship status: Not on file  . Intimate partner violence:    Fear of current or ex partner: Not on file    Emotionally abused: Not on file    Physically abused: Not on file    Forced sexual activity: Not on file  Other Topics Concern  . Not on file  Social History Narrative  . Not on file    Family History  Problem Relation Age of Onset  . Cancer Mother   . Arthritis Mother   . Heart attack Father   . Arthritis Father   . Transient ischemic attack Brother   . Prostate cancer Neg Hx   . Bladder Cancer Neg Hx      Current Outpatient Medications:  .  allopurinol (ZYLOPRIM) 300 MG tablet, Take by mouth daily. In am., Disp: , Rfl:  .  Glucosamine Sulfate 1000 MG CAPS, Take 1 capsule by mouth 2 (two)  times daily. , Disp: , Rfl:  .  hydrochlorothiazide (HYDRODIURIL) 25 MG tablet, Take 25 mg by mouth. In am, Disp: , Rfl:  .  phentermine 15 MG capsule, Take 15 mg by mouth every morning., Disp: , Rfl:  .  predniSONE (DELTASONE) 5 MG tablet, TAKE 1 TABLET (5 MG TOTAL) BY MOUTH 2 TIMES DAILY WITH A MEAL., Disp: 60 tablet, Rfl: 5 .  simvastatin (ZOCOR) 40 MG tablet, Take 40 mg by mouth daily at 6 PM. , Disp: , Rfl:  .  warfarin (COUMADIN) 2 MG tablet, Take 2 mg by mouth 2 (two) times a week. tues and thurs, Disp: , Rfl:  .  warfarin (COUMADIN) 5 MG tablet, TAKE ONE (1) TABLET EACH DAY on mon, wed, fri, sat, sun, Disp: , Rfl:  .  acetaminophen (TYLENOL) 325 MG tablet, Take 650 mg by mouth 2 (two) times daily., Disp: , Rfl:  .  DULoxetine (CYMBALTA) 30 MG capsule, Take 1 capsule (30 mg total) daily by mouth. (Patient not taking: Reported on 09/28/2018), Disp: 60 capsule, Rfl: 3 .  lidocaine-prilocaine (EMLA) cream, Apply small amount over port site for port flushes (Patient not taking: Reported on 08/10/2018), Disp: 30 g, Rfl: 3 .  LORazepam (ATIVAN) 0.5 MG tablet, Take 1 tablet (0.5 mg total) by mouth every 6 (six) hours as needed (Nausea or vomiting). (Patient not taking: Reported on  02/28/2018), Disp: 30 tablet, Rfl: 0 .  ondansetron (ZOFRAN) 8 MG tablet, Take 1 tablet (8 mg total) by mouth 2 (two) times daily as needed for refractory nausea / vomiting. (Patient not taking: Reported on 02/28/2018), Disp: 30 tablet, Rfl: 1 .  potassium chloride SA (K-DUR,KLOR-CON) 20 MEQ tablet, Take 2 tablets (40 mEq total) by mouth daily for 4 days., Disp: 8 tablet, Rfl: 0 .  prochlorperazine (COMPAZINE) 10 MG tablet, Take 1 tablet (10 mg total) by mouth every 6 (six) hours as needed (Nausea or vomiting). (Patient not taking: Reported on 02/28/2018), Disp: 30 tablet, Rfl: 1  Current Facility-Administered Medications:  .  lidocaine (XYLOCAINE) 2 % jelly 1 application, 1 application, Urethral, Once, Nickie Retort, MD  Physical exam:  Vitals:   10/26/18 1445  BP: 137/89  Pulse: (!) 55  Resp: 18  Temp: 97.9 F (36.6 C)  TempSrc: Tympanic  SpO2: 96%  Weight: 256 lb 14.4 oz (116.5 kg)  Height: 6' 0.84" (1.85 m)   Physical Exam  Constitutional: He is oriented to person, place, and time.  Elderly mildly obese male in no acute distress  HENT:  Head: Normocephalic and atraumatic.  Eyes: Pupils are equal, round, and reactive to light. EOM are normal.  Neck: Normal range of motion.  Cardiovascular: Normal rate, regular rhythm and normal heart sounds.  Pulmonary/Chest: Effort normal and breath sounds normal.  Abdominal: Soft. Bowel sounds are normal.  Musculoskeletal: He exhibits edema (Trace bilateral).  Neurological: He is alert and oriented to person, place, and time.  Skin: Skin is warm and dry.     CMP Latest Ref Rng & Units 10/26/2018  Glucose 70 - 99 mg/dL 107(H)  BUN 8 - 23 mg/dL 28(H)  Creatinine 0.61 - 1.24 mg/dL 1.33(H)  Sodium 135 - 145 mmol/L 138  Potassium 3.5 - 5.1 mmol/L 3.7  Chloride 98 - 111 mmol/L 99  CO2 22 - 32 mmol/L 28  Calcium 8.9 - 10.3 mg/dL 9.5  Total Protein 6.5 - 8.1 g/dL 6.9  Total Bilirubin 0.3 - 1.2 mg/dL 2.0(H)  Alkaline Phos 38 - 126 U/L 71  AST 15 - 41 U/L 23  ALT 0 - 44 U/L 12   CBC Latest Ref Rng & Units 10/26/2018  WBC 4.0 - 10.5 K/uL 5.1  Hemoglobin 13.0 - 17.0 g/dL 12.9(L)  Hematocrit 39.0 - 52.0 % 38.5(L)  Platelets 150 - 400 K/uL 128(L)    No images are attached to the encounter.  Ct Chest W Contrast  Result Date: 10/11/2018 CLINICAL DATA:  Prostate cancer status post radical prostatectomy in 1999 with biochemical recurrence in 2017. Restaging. EXAM: CT CHEST, ABDOMEN, AND PELVIS WITH CONTRAST TECHNIQUE: Multidetector CT imaging of the chest, abdomen and pelvis was performed following the standard protocol during bolus administration of intravenous contrast. CONTRAST:  133m ISOVUE-300 IOPAMIDOL (ISOVUE-300) INJECTION 61% COMPARISON:  07/04/2018 CT chest, abdomen and pelvis. FINDINGS: CT CHEST FINDINGS Cardiovascular: Stable top-normal heart size. No significant pericardial effusion/thickening. Three-vessel coronary atherosclerosis. Two lead left subclavian pacemaker is noted with lead tips in the right atrium and right ventricular apex. Atherosclerotic nonaneurysmal thoracic aorta. Stable top-normal main pulmonary artery (3.1 cm diameter). No central pulmonary emboli. Mediastinum/Nodes: No discrete thyroid nodules. Unremarkable esophagus. No pathologically enlarged axillary, mediastinal or hilar lymph nodes. Lungs/Pleura: No pneumothorax. No pleural effusion. Punctate calcified granulomas scattered in the bilateral lower lobes are stable since at least 06/27/2017 and considered benign. Solid 4 mm superior segment left lower lobe pulmonary nodule (series 4/image 64), stable since 06/27/2017 chest CT and presumably benign. No acute consolidative airspace disease, lung masses or new significant pulmonary nodules. Musculoskeletal: Small sclerotic lesions in the anterior lower left scapula (series 4/image 79) and anterior T4 vertebral body (series 6/image 108) are slightly increased in sclerosis in the interval since 07/04/2018 and  are new since 06/27/2017 chest CT. No new focal osseous lesions. Moderate thoracic spondylosis. Stable symmetric marked gynecomastia. Healed deformities in multiple posterior right ribs. CT ABDOMEN PELVIS FINDINGS Hepatobiliary: Normal liver with no liver mass. Normal gallbladder with no radiopaque cholelithiasis. No biliary ductal dilatation. Pancreas: No pancreatic mass or duct dilation. Stable heterogeneous fatty attenuation in the pancreas. Spleen: Normal size. No mass. Adrenals/Urinary Tract: Normal adrenals. Simple 3.0 cm lower left renal parapelvic cyst. No hydronephrosis. No additional renal lesions. Collapsed normal bladder. Stomach/Bowel: Normal non-distended stomach. Normal caliber small bowel with no small bowel wall thickening. Appendix appears diminutive and normal. Oral contrast transits to descending colon. Mild sigmoid diverticulosis. No large bowel wall thickening or acute pericolonic fat stranding. Vascular/Lymphatic: Atherosclerotic nonaneurysmal abdominal aorta. Patent portal, splenic, hepatic and renal veins. Top-normal size aortocaval nodes measuring up to 0.9 cm are stable. No pathologically enlarged lymph nodes in the abdomen or pelvis. Reproductive: Status post prostatectomy. No discrete mass or fluid collection in the prostatectomy bed. Partially visualized penile prosthesis with reservoir in ventral right pelvic wall. Other: No pneumoperitoneum, ascites or focal fluid collection. Musculoskeletal: No aggressive appearing focal osseous lesions. Marked lumbar spondylosis. IMPRESSION: 1. Small sclerotic lesions in the lower left scapula and T4 vertebral body are slightly increased in sclerosis since 07/04/2018 CT and are new since 06/27/2017 CT. These are compatible with small sclerotic osseous metastases. The interval increased sclerosis is nonspecific and could be due to treatment effect versus slowly enlarging tumor. 2. No additional potential findings of metastatic disease. Top-normal  size retroperitoneal nodes are stable. 3. Chronic findings include: Aortic Atherosclerosis (ICD10-I70.0). Coronary atherosclerosis. Prominent symmetric gynecomastia. Mild sigmoid diverticulosis. Electronically Signed   By: JIlona SorrelM.D.   On: 10/11/2018 14:07   Nm Bone Scan Whole Body  Result Date: 10/11/2018 CLINICAL DATA:  72year old  male with prostate cancer.  Restaging. EXAM: NUCLEAR MEDICINE WHOLE BODY BONE SCAN TECHNIQUE: Whole body anterior and posterior images were obtained approximately 3 hours after intravenous injection of radiopharmaceutical. RADIOPHARMACEUTICALS:  22.9 mCi Technetium-10mMDP IV COMPARISON:  Whole-body bone scan 07/04/2018 and earlier. CT Abdomen and Pelvis today reported separately. FINDINGS: Expected radiotracer activity in both kidneys and the urinary bladder. Perineum contamination. Stable radiotracer activity in the thorax including posttraumatic multilevel posterior right lower rib activity. Radiotracer activity at the tip of the bilateral scapulae appears much more symmetric today and ordinarily would be physiologic. However, the small sclerotic lesion at the tip of the left scapula was increased by CT today. Stable and negative radiotracer activity in the skull, lumbar spine and pelvis. Bilateral hip and knee arthroplasty related changes redemonstrated. Degenerative appearing radiotracer activity at the shoulders, wrists and feet appears stable. IMPRESSION: 1. Small sclerotic metastasis at the inferior tip of the left scapula is better characterized by CT. 2. No other scintigraphic evidence of metastatic disease to bone. 3. Chronic posttraumatic, postoperative, and degenerative radiotracer activity appears stable. Electronically Signed   By: HGenevie AnnM.D.   On: 10/11/2018 17:30   Ct Abdomen Pelvis W Contrast  Result Date: 10/11/2018 CLINICAL DATA:  Prostate cancer status post radical prostatectomy in 1999 with biochemical recurrence in 2017. Restaging. EXAM: CT  CHEST, ABDOMEN, AND PELVIS WITH CONTRAST TECHNIQUE: Multidetector CT imaging of the chest, abdomen and pelvis was performed following the standard protocol during bolus administration of intravenous contrast. CONTRAST:  1054mISOVUE-300 IOPAMIDOL (ISOVUE-300) INJECTION 61% COMPARISON:  07/04/2018 CT chest, abdomen and pelvis. FINDINGS: CT CHEST FINDINGS Cardiovascular: Stable top-normal heart size. No significant pericardial effusion/thickening. Three-vessel coronary atherosclerosis. Two lead left subclavian pacemaker is noted with lead tips in the right atrium and right ventricular apex. Atherosclerotic nonaneurysmal thoracic aorta. Stable top-normal main pulmonary artery (3.1 cm diameter). No central pulmonary emboli. Mediastinum/Nodes: No discrete thyroid nodules. Unremarkable esophagus. No pathologically enlarged axillary, mediastinal or hilar lymph nodes. Lungs/Pleura: No pneumothorax. No pleural effusion. Punctate calcified granulomas scattered in the bilateral lower lobes are stable since at least 06/27/2017 and considered benign. Solid 4 mm superior segment left lower lobe pulmonary nodule (series 4/image 64), stable since 06/27/2017 chest CT and presumably benign. No acute consolidative airspace disease, lung masses or new significant pulmonary nodules. Musculoskeletal: Small sclerotic lesions in the anterior lower left scapula (series 4/image 79) and anterior T4 vertebral body (series 6/image 108) are slightly increased in sclerosis in the interval since 07/04/2018 and are new since 06/27/2017 chest CT. No new focal osseous lesions. Moderate thoracic spondylosis. Stable symmetric marked gynecomastia. Healed deformities in multiple posterior right ribs. CT ABDOMEN PELVIS FINDINGS Hepatobiliary: Normal liver with no liver mass. Normal gallbladder with no radiopaque cholelithiasis. No biliary ductal dilatation. Pancreas: No pancreatic mass or duct dilation. Stable heterogeneous fatty attenuation in the  pancreas. Spleen: Normal size. No mass. Adrenals/Urinary Tract: Normal adrenals. Simple 3.0 cm lower left renal parapelvic cyst. No hydronephrosis. No additional renal lesions. Collapsed normal bladder. Stomach/Bowel: Normal non-distended stomach. Normal caliber small bowel with no small bowel wall thickening. Appendix appears diminutive and normal. Oral contrast transits to descending colon. Mild sigmoid diverticulosis. No large bowel wall thickening or acute pericolonic fat stranding. Vascular/Lymphatic: Atherosclerotic nonaneurysmal abdominal aorta. Patent portal, splenic, hepatic and renal veins. Top-normal size aortocaval nodes measuring up to 0.9 cm are stable. No pathologically enlarged lymph nodes in the abdomen or pelvis. Reproductive: Status post prostatectomy. No discrete mass or fluid collection in the prostatectomy  bed. Partially visualized penile prosthesis with reservoir in ventral right pelvic wall. Other: No pneumoperitoneum, ascites or focal fluid collection. Musculoskeletal: No aggressive appearing focal osseous lesions. Marked lumbar spondylosis. IMPRESSION: 1. Small sclerotic lesions in the lower left scapula and T4 vertebral body are slightly increased in sclerosis since 07/04/2018 CT and are new since 06/27/2017 CT. These are compatible with small sclerotic osseous metastases. The interval increased sclerosis is nonspecific and could be due to treatment effect versus slowly enlarging tumor. 2. No additional potential findings of metastatic disease. Top-normal size retroperitoneal nodes are stable. 3. Chronic findings include: Aortic Atherosclerosis (ICD10-I70.0). Coronary atherosclerosis. Prominent symmetric gynecomastia. Mild sigmoid diverticulosis. Electronically Signed   By: Ilona Sorrel M.D.   On: 10/11/2018 14:07     Assessment and plan- Patient is a 72 y.o. male with history of castrate resistant metastatic prostate cancer with metastases to the lymph nodes and the scapula  I have  reviewed CT chest abdomen and pelvis images as well as bone scan images independently.  Retroperitoneal lymph nodes have actually decreased in size which was initially the only size of metastatic disease.  There are small sclerotic lesions that were noted in the left scapula in July as well and there seen again in addition to the T4 vertebral body which likely represents sclerotic bone metastases.  His PSA is slowly coming down from 16 down to 8.  I will therefore continue his Zytiga and prednisone at this time.  Discussed the role of monthly Xgeva for bone metastases to decrease the incidence of skeletal related fractures.  He will need dental clearance given the risk of osteonecrosis of the jaw associated with Xgeva.  We will touch base with his dentist Dr. Duffy Bruce and tentatively schedule him to start Xgeva in 1 month's time with a CBC and CMP.  I will see him back in 2 months time with CBC with differential CMP and a PSA  Patient has a mildly waxing and waning bilirubin between 1.322 while on Zytiga.  AST and ALT are normal.  Continue to monitor.  Patient also has mild AKI today with a creatinine of 1.3.  Repeat CMP in 1 month.  Have asked him to improve his oral hydration   Visit Diagnosis 1. Malignant neoplasm of prostate ([place])   2. High risk medication use   3. Abnormal LFTs      Dr. Randa Evens, MD, MPH [facility] [phone] 10/27/2018 8:23 AM

## 2018-10-31 DIAGNOSIS — E782 Mixed hyperlipidemia: Secondary | ICD-10-CM | POA: Diagnosis not present

## 2018-10-31 DIAGNOSIS — E538 Deficiency of other specified B group vitamins: Secondary | ICD-10-CM | POA: Diagnosis not present

## 2018-10-31 DIAGNOSIS — M1A00X0 Idiopathic chronic gout, unspecified site, without tophus (tophi): Secondary | ICD-10-CM | POA: Diagnosis not present

## 2018-10-31 DIAGNOSIS — I4821 Permanent atrial fibrillation: Secondary | ICD-10-CM | POA: Diagnosis not present

## 2018-11-06 MED FILL — predniSONE 5 MG TABS: 5 | 30 days supply | Qty: 60 | Fill #1

## 2018-11-07 DIAGNOSIS — E538 Deficiency of other specified B group vitamins: Secondary | ICD-10-CM | POA: Insufficient documentation

## 2018-11-07 DIAGNOSIS — I420 Dilated cardiomyopathy: Secondary | ICD-10-CM | POA: Diagnosis not present

## 2018-11-07 DIAGNOSIS — I480 Paroxysmal atrial fibrillation: Secondary | ICD-10-CM | POA: Diagnosis not present

## 2018-11-07 DIAGNOSIS — Z0000 Encounter for general adult medical examination without abnormal findings: Secondary | ICD-10-CM | POA: Diagnosis not present

## 2018-11-07 DIAGNOSIS — E782 Mixed hyperlipidemia: Secondary | ICD-10-CM | POA: Diagnosis not present

## 2018-11-16 MED FILL — ABIRATERONE ACETATE 250 MG: 250 | 30 days supply | Qty: 120 | Fill #2

## 2018-11-20 ENCOUNTER — Other Ambulatory Visit: Payer: Self-pay | Admitting: Oncology

## 2018-11-23 ENCOUNTER — Inpatient Hospital Stay: Payer: PPO | Attending: Oncology

## 2018-11-23 ENCOUNTER — Inpatient Hospital Stay: Payer: PPO

## 2018-11-23 DIAGNOSIS — C61 Malignant neoplasm of prostate: Secondary | ICD-10-CM | POA: Insufficient documentation

## 2018-11-23 DIAGNOSIS — C775 Secondary and unspecified malignant neoplasm of intrapelvic lymph nodes: Secondary | ICD-10-CM | POA: Diagnosis not present

## 2018-11-23 LAB — COMPREHENSIVE METABOLIC PANEL
ALK PHOS: 68 U/L (ref 38–126)
ALT: 11 U/L (ref 0–44)
AST: 17 U/L (ref 15–41)
Albumin: 4.4 g/dL (ref 3.5–5.0)
Anion gap: 12 (ref 5–15)
BUN: 31 mg/dL — ABNORMAL HIGH (ref 8–23)
CHLORIDE: 99 mmol/L (ref 98–111)
CO2: 26 mmol/L (ref 22–32)
CREATININE: 1.13 mg/dL (ref 0.61–1.24)
Calcium: 9.6 mg/dL (ref 8.9–10.3)
GFR calc Af Amer: >60 mL/min (ref >60)
GFR calc non Af Amer: >60 mL/min (ref >60)
GLUCOSE: 100 mg/dL — AB (ref 70–99)
POTASSIUM: 3.5 mmol/L (ref 3.5–5.1)
Sodium: 137 mmol/L (ref 135–145)
TOTAL PROTEIN: 6.6 g/dL (ref 6.5–8.1)
Total Bilirubin: 1.9 mg/dL — ABNORMAL HIGH (ref 0.3–1.2)

## 2018-11-23 LAB — CBC WITH DIFFERENTIAL/PLATELET
Abs Immature Granulocytes: 0.01 10*3/uL (ref 0.00–0.07)
BASOS ABS: 0 10*3/uL (ref 0.0–0.1)
BASOS PCT: 0 %
Eosinophils Absolute: 0.1 10*3/uL (ref 0.0–0.5)
Eosinophils Relative: 1 %
HEMATOCRIT: 39.7 % (ref 39.0–52.0)
HEMOGLOBIN: 13.5 g/dL (ref 13.0–17.0)
Immature Granulocytes: 0 %
LYMPHS PCT: 23 %
Lymphs Abs: 1.2 10*3/uL (ref 0.7–4.0)
MCH: 31 pg (ref 26.0–34.0)
MCHC: 34 g/dL (ref 30.0–36.0)
MCV: 91.3 fL (ref 80.0–100.0)
MONOS PCT: 8 %
Monocytes Absolute: 0.4 10*3/uL (ref 0.1–1.0)
NEUTROS ABS: 3.7 10*3/uL (ref 1.7–7.7)
NEUTROS PCT: 68 %
NRBC: 0 % (ref 0.0–0.2)
PLATELETS: 134 10*3/uL — AB (ref 150–400)
RBC: 4.35 MIL/uL (ref 4.22–5.81)
RDW: 13.9 % (ref 11.5–15.5)
WBC: 5.4 10*3/uL (ref 4.0–10.5)

## 2018-11-23 MED ORDER — HEPARIN SOD (PORK) LOCK FLUSH 100 UNIT/ML IV SOLN
INTRAVENOUS | Status: AC
  Filled 2018-11-23: qty 5

## 2018-11-23 MED ORDER — HEPARIN SOD (PORK) LOCK FLUSH 100 UNIT/ML IV SOLN
500.0000 [IU] | Freq: Once | INTRAVENOUS | Status: AC
  Administered 2018-11-23: 500 [IU] via INTRAVENOUS

## 2018-11-23 MED ORDER — SODIUM CHLORIDE 0.9% FLUSH
10.0000 mL | INTRAVENOUS | Status: DC | PRN
  Administered 2018-11-23: 10 mL via INTRAVENOUS
  Filled 2018-11-23: qty 10

## 2018-11-30 MED FILL — predniSONE 5 MG TABS: 5 | 30 days supply | Qty: 60 | Fill #2

## 2018-12-08 DIAGNOSIS — I480 Paroxysmal atrial fibrillation: Secondary | ICD-10-CM | POA: Diagnosis not present

## 2018-12-08 DIAGNOSIS — E538 Deficiency of other specified B group vitamins: Secondary | ICD-10-CM | POA: Diagnosis not present

## 2018-12-12 ENCOUNTER — Telehealth: Payer: Self-pay | Admitting: Pharmacy Technician

## 2018-12-12 NOTE — Telephone Encounter (Signed)
Oral Oncology Patient Advocate Encounter  Called [employer] to complete a renewal prior authorization that was ending 12/12/18.  Per [employer] the previous prior authorization was approved until 12/13/19 but their letters of approval could not state the future calendar year date.  [place] [facility] [place] Phone [phone] Fax [phone] 12/12/2018 2:42 PM

## 2018-12-14 MED FILL — ABIRATERONE ACETATE 250 MG: 250 | 30 days supply | Qty: 120 | Fill #3

## 2018-12-21 ENCOUNTER — Inpatient Hospital Stay: Payer: PPO | Attending: Oncology

## 2018-12-21 ENCOUNTER — Telehealth: Payer: Self-pay | Admitting: Pharmacist

## 2018-12-21 ENCOUNTER — Inpatient Hospital Stay: Payer: PPO

## 2018-12-21 ENCOUNTER — Encounter: Payer: Self-pay | Admitting: Oncology

## 2018-12-21 ENCOUNTER — Inpatient Hospital Stay (HOSPITAL_BASED_OUTPATIENT_CLINIC_OR_DEPARTMENT_OTHER): Payer: PPO | Admitting: Oncology

## 2018-12-21 VITALS — BP 133/80 | HR 64 | Temp 98.2°F | Resp 18 | Ht 72.0 in | Wt 260.9 lb

## 2018-12-21 DIAGNOSIS — C779 Secondary and unspecified malignant neoplasm of lymph node, unspecified: Secondary | ICD-10-CM | POA: Insufficient documentation

## 2018-12-21 DIAGNOSIS — G620 Drug-induced polyneuropathy: Secondary | ICD-10-CM | POA: Insufficient documentation

## 2018-12-21 DIAGNOSIS — R17 Unspecified jaundice: Secondary | ICD-10-CM | POA: Insufficient documentation

## 2018-12-21 DIAGNOSIS — E860 Dehydration: Secondary | ICD-10-CM

## 2018-12-21 DIAGNOSIS — C7951 Secondary malignant neoplasm of bone: Secondary | ICD-10-CM | POA: Insufficient documentation

## 2018-12-21 DIAGNOSIS — Z79899 Other long term (current) drug therapy: Secondary | ICD-10-CM | POA: Insufficient documentation

## 2018-12-21 DIAGNOSIS — Z7901 Long term (current) use of anticoagulants: Secondary | ICD-10-CM | POA: Diagnosis not present

## 2018-12-21 DIAGNOSIS — I4891 Unspecified atrial fibrillation: Secondary | ICD-10-CM | POA: Insufficient documentation

## 2018-12-21 DIAGNOSIS — C61 Malignant neoplasm of prostate: Secondary | ICD-10-CM | POA: Diagnosis not present

## 2018-12-21 DIAGNOSIS — T451X5A Adverse effect of antineoplastic and immunosuppressive drugs, initial encounter: Secondary | ICD-10-CM

## 2018-12-21 LAB — COMPREHENSIVE METABOLIC PANEL
ALBUMIN: 4.2 g/dL (ref 3.5–5.0)
ALK PHOS: 67 U/L (ref 38–126)
ALT: 12 U/L (ref 0–44)
AST: 21 U/L (ref 15–41)
Anion gap: 12 (ref 5–15)
BUN: 28 mg/dL — AB (ref 8–23)
CO2: 26 mmol/L (ref 22–32)
Calcium: 9.4 mg/dL (ref 8.9–10.3)
Chloride: 102 mmol/L (ref 98–111)
Creatinine, Ser: 1.16 mg/dL (ref 0.61–1.24)
GFR calc non Af Amer: >60 mL/min (ref >60)
GLUCOSE: 131 mg/dL — AB (ref 70–99)
Potassium: 3.3 mmol/L — ABNORMAL LOW (ref 3.5–5.1)
SODIUM: 140 mmol/L (ref 135–145)
TOTAL PROTEIN: 6.8 g/dL (ref 6.5–8.1)
Total Bilirubin: 1.9 mg/dL — ABNORMAL HIGH (ref 0.3–1.2)

## 2018-12-21 LAB — CBC WITH DIFFERENTIAL/PLATELET
Abs Immature Granulocytes: 0.01 10*3/uL (ref 0.00–0.07)
BASOS PCT: 0 %
Basophils Absolute: 0 10*3/uL (ref 0.0–0.1)
EOS PCT: 1 %
Eosinophils Absolute: 0.1 10*3/uL (ref 0.0–0.5)
HEMATOCRIT: 38.6 % — AB (ref 39.0–52.0)
Hemoglobin: 12.8 g/dL — ABNORMAL LOW (ref 13.0–17.0)
Immature Granulocytes: 0 %
LYMPHS ABS: 1 10*3/uL (ref 0.7–4.0)
Lymphocytes Relative: 20 %
MCH: 30.9 pg (ref 26.0–34.0)
MCHC: 33.2 g/dL (ref 30.0–36.0)
MCV: 93.2 fL (ref 80.0–100.0)
MONO ABS: 0.4 10*3/uL (ref 0.1–1.0)
MONOS PCT: 9 %
NEUTROS PCT: 70 %
Neutro Abs: 3.3 10*3/uL (ref 1.7–7.7)
PLATELETS: 149 10*3/uL — AB (ref 150–400)
RBC: 4.14 MIL/uL — ABNORMAL LOW (ref 4.22–5.81)
RDW: 14.2 % (ref 11.5–15.5)
WBC: 4.8 10*3/uL (ref 4.0–10.5)
nRBC: 0 % (ref 0.0–0.2)

## 2018-12-21 LAB — PSA: PROSTATIC SPECIFIC ANTIGEN: 7.34 ng/mL — AB (ref 0.00–4.00)

## 2018-12-21 MED ORDER — DENOSUMAB 120 MG/1.7ML ~~LOC~~ SOLN
120.0000 mg | SUBCUTANEOUS | Status: DC
  Administered 2018-12-21: 120 mg via SUBCUTANEOUS
  Filled 2018-12-21: qty 1.7

## 2018-12-21 NOTE — Progress Notes (Signed)
No concerns today 

## 2018-12-21 NOTE — Telephone Encounter (Signed)
Oral Chemotherapy Pharmacist Encounter  Successfully enrolled patient for copayment assistance funds from [facility] from the Prostate Cancer fund.  Award amount: $7,300 Effective dates: 09/15/18 - 12/14/19 ID: [id number] BIN: [id number] Group: [id number] PCN: [id number]  Billing information will be shared with [facility]. I will place a copy of the award letter to be scanned into patient's chart.  Darl Pikes, PharmD, BCPS Hematology/Oncology Clinical Pharmacist [facility] [phone]  12/21/2018 10:46 AM

## 2018-12-22 NOTE — Progress Notes (Signed)
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 He had biochemical recurrence in 2017 and was treated with IM RT. He again began to have a rising PSA in 2010 and underwent orchiectomy in 2010.his PSA was being monitored closely PSA a year ago was 0.5, 5 months ago was 3.9 in 2 months ago was 7.8.he underwent CT abdomen and pelvis in May 2018which showed abnormal periaortic and right common iliac lymph nodes. Index aortocaval lymph node 1.4 cm which was new as compared to February 2014 and suspicious for malignancy. Bone scan did not reveal any evidence of bony metastatic disease. He has been referred to [place] for systemic therapy options for CRPC  Patient lives with his wife and is independent of his ADL's. He has A fib for which he is on coumadin. Also has chronic urinary incontinence from prior prostate surgery and has AUS. Reports 50 pound intentional weight loss since October 2017  Repeat PSA in July 2018 was 13.3 indicating PSA doubling time of 3 months  Docetaxel chemo initiated for CRPC with ln mets and rapid doubling PSA in august 2018.Patient completed 5 cycles of docetaxel on 09/27/2017. Cycle #6 was not given due to progressive fatigue and cytopenias as well as worsening neuropathy. Cycle 5 of docetaxel was dose reduced to 60 mg/m square  In April 2019 patient noted to have rising PSA and increase in the size of retroperitoneal LN. Patient therefore  started second line zytiga  Interval history-he is tolerating Zytiga and prednisone well.  He denies any nausea vomiting diarrhea.  His appetite is good.  He has mild baseline neuropathy in his feet which is essentially stable  ECOG PS- 1 Pain scale- 0  Review of systems- Review of Systems  Constitutional: Positive for malaise/fatigue. Negative for chills, fever and weight loss.  HENT: Negative for congestion, ear discharge and nosebleeds.   Eyes: Negative for blurred vision.  Respiratory: Negative for cough, hemoptysis, sputum production, shortness of breath and wheezing.   Cardiovascular: Negative for chest pain, palpitations, orthopnea and claudication.  Gastrointestinal: Negative for abdominal pain, blood in stool, constipation, diarrhea, heartburn, melena, nausea and vomiting.  Genitourinary: Negative for dysuria, flank pain, frequency, hematuria and urgency.  Musculoskeletal: Negative for back pain, joint pain and myalgias.  Skin: Negative for rash.  Neurological: Positive for sensory change (Peripheral neuropathy). Negative for dizziness, tingling, focal weakness, seizures, weakness and headaches.  Endo/Heme/Allergies: Does not bruise/bleed easily.  Psychiatric/Behavioral: Negative for depression and suicidal ideas. The patient does not have insomnia.       Allergies  Allergen Reactions  . Latex Rash    Sensitivity, not allergy.     Past Medical History:  Diagnosis Date  . Arthritis, degenerative 10/30/2015  . Atrial fibrillation ([place]) 10/30/2015  . Bladder spasm 06/12/2013  . Carcinoma of prostate ([place]) 10/30/2015  . Cardiomyopathy ([place]) 09/10/2014   Overview:  EF 35%,4/15   . Cerebrovascular accident (CVA) ([place]) 10/30/2015   Overview:  Left embolic PNT,6144   . Difficult or painful urination 11/21/2013  .  Dysrhythmia   . Gout 10/30/2015  . History of kidney stones   . Malignant neoplasm of prostate ([place]) 09/05/2012  . Sinoatrial node dysfunction ([facility]) 10/30/2015    Overview:  DDD pacemaker, 1996      Past Surgical History:  Procedure Laterality Date  . castration  10/2009  . CATARACT EXTRACTION W/ INTRAOCULAR LENS  IMPLANT, BILATERAL Bilateral 2016   right eye done then the left one done 3 months later in 2016  . INGUINAL HERNIA REPAIR Left   . INSERT / REPLACE / REMOVE PACEMAKER    . IR FLUORO GUIDE PORT INSERTION RIGHT  07/01/2017  . JOINT REPLACEMENT Bilateral 1980   hips  . left knee replacement  2014  . PACEMAKER INSERTION    . PROSTATECTOMY    . REPLACEMENT TOTAL HIP W/  RESURFACING IMPLANTS  1997   bilateral-   . REPLACEMENT TOTAL KNEE Bilateral   . testes removal     per pt   . TONSILLECTOMY AND ADENOIDECTOMY    . TRANSURETHRAL RESECTION OF BLADDER TUMOR N/A 12/31/2015   Procedure: BLADDER BIOPSY;  Surgeon: Nickie Retort, MD;  Location: [facility] ORS;  Service: Urology;  Laterality: N/A;  . Urinary incontinence valve     AMS    Social History   Socioeconomic History  . Marital status: Married    Spouse name: Not on file  . Number of children: Not on file  . Years of education: Not on file  . Highest education level: Not on file  Occupational History  . Not on file  Social Needs  . Financial resource strain: Not on file  . Food insecurity:    Worry: Not on file    Inability: Not on file  . Transportation needs:    Medical: Not on file    Non-medical: Not on file  Tobacco Use  . Smoking status: Never Smoker  . Smokeless tobacco: Never Used  Substance and Sexual Activity  . Alcohol use: No    Alcohol/week: 0.0 standard drinks  . Drug use: No  . Sexual activity: Never  Lifestyle  . Physical activity:    Days per week: Not on file    Minutes per session: Not on file  . Stress: Not on file  Relationships  . Social connections:    Talks on phone: Not on file    Gets together: Not on file    Attends religious service: Not on file    Active member of club or organization: Not on file    Attends meetings of clubs or  organizations: Not on file    Relationship status: Not on file  . Intimate partner violence:    Fear of current or ex partner: Not on file    Emotionally abused: Not on file    Physically abused: Not on file    Forced sexual activity: Not on file  Other Topics Concern  . Not on file  Social History Narrative  . Not on file    Family History  Problem Relation Age of Onset  . Cancer Mother   . Arthritis Mother   . Heart attack Father   . Arthritis Father   . Transient ischemic attack Brother   . Prostate cancer Neg Hx   . Bladder Cancer Neg Hx      Current Outpatient Medications:  .  Abiraterone Acetate (ZYTIGA PO), Take 4 capsules by mouth daily. , Disp: , Rfl:  .  acetaminophen (TYLENOL) 325 MG tablet, Take 650 mg  by mouth 2 (two) times daily., Disp: , Rfl:  .  allopurinol (ZYLOPRIM) 300 MG tablet, Take by mouth daily. In am., Disp: , Rfl:  .  hydrochlorothiazide (HYDRODIURIL) 25 MG tablet, Take 25 mg by mouth. In am, Disp: , Rfl:  .  lidocaine-prilocaine (EMLA) cream, Apply small amount over port site for port flushes, Disp: 30 g, Rfl: 3 .  phentermine 15 MG capsule, Take 15 mg by mouth every morning., Disp: , Rfl:  .  predniSONE (DELTASONE) 5 MG tablet, TAKE 1 TABLET (5 MG TOTAL) BY MOUTH 2 TIMES DAILY WITH A MEAL., Disp: 60 tablet, Rfl: 5 .  prochlorperazine (COMPAZINE) 10 MG tablet, Take 1 tablet (10 mg total) by mouth every 6 (six) hours as needed (Nausea or vomiting)., Disp: 30 tablet, Rfl: 1 .  simvastatin (ZOCOR) 40 MG tablet, Take 40 mg by mouth daily at 6 PM. , Disp: , Rfl:  .  warfarin (COUMADIN) 2 MG tablet, Take 2 mg by mouth 2 (two) times a week. tues and thurs, Disp: , Rfl:  .  warfarin (COUMADIN) 5 MG tablet, TAKE ONE (1) TABLET EACH DAY on mon, wed, fri, sat, sun, Disp: , Rfl:  .  LORazepam (ATIVAN) 0.5 MG tablet, Take 1 tablet (0.5 mg total) by mouth every 6 (six) hours as needed (Nausea or vomiting). (Patient not taking: Reported on 02/28/2018), Disp: 30 tablet,  Rfl: 0 .  ondansetron (ZOFRAN) 8 MG tablet, Take 1 tablet (8 mg total) by mouth 2 (two) times daily as needed for refractory nausea / vomiting. (Patient not taking: Reported on 02/28/2018), Disp: 30 tablet, Rfl: 1  Physical exam:  Vitals:   12/21/18 1444  BP: 133/80  Pulse: 64  Resp: 18  Temp: 98.2 F (36.8 C)  TempSrc: Tympanic  Weight: 260 lb 14.4 oz (118.3 kg)  Height: 6' (1.829 m)   Physical Exam Constitutional:      General: He is not in acute distress. HENT:     Head: Normocephalic and atraumatic.  Eyes:     Pupils: Pupils are equal, round, and reactive to light.  Neck:     Musculoskeletal: Normal range of motion.  Cardiovascular:     Rate and Rhythm: Normal rate and regular rhythm.     Heart sounds: Normal heart sounds.  Pulmonary:     Effort: Pulmonary effort is normal.     Breath sounds: Normal breath sounds.  Abdominal:     General: Bowel sounds are normal.     Palpations: Abdomen is soft.  Skin:    General: Skin is warm and dry.  Neurological:     Mental Status: He is alert and oriented to person, place, and time.      CMP Latest Ref Rng & Units 12/21/2018  Glucose 70 - 99 mg/dL 131(H)  BUN 8 - 23 mg/dL 28(H)  Creatinine 0.61 - 1.24 mg/dL 1.16  Sodium 135 - 145 mmol/L 140  Potassium 3.5 - 5.1 mmol/L 3.3(L)  Chloride 98 - 111 mmol/L 102  CO2 22 - 32 mmol/L 26  Calcium 8.9 - 10.3 mg/dL 9.4  Total Protein 6.5 - 8.1 g/dL 6.8  Total Bilirubin 0.3 - 1.2 mg/dL 1.9(H)  Alkaline Phos 38 - 126 U/L 67  AST 15 - 41 U/L 21  ALT 0 - 44 U/L 12   CBC Latest Ref Rng & Units 12/21/2018  WBC 4.0 - 10.5 K/uL 4.8  Hemoglobin 13.0 - 17.0 g/dL 12.8(L)  Hematocrit 39.0 - 52.0 % 38.6(L)  Platelets 150 -  400 K/uL 149(L)     Assessment and plan- Patient is a 73 y.o. male with history of castrate resistant metastatic prostate cancer with metastases to the lymph nodes and the scapula.  He is here for routine follow-up of prostate cancer  1.  He is responding well to Zytiga  and prednisone and his PSA is slowly trending down.  He will continue this.  He does have mildly elevated bilirubin which has been slowly trending up again and up to 1.9.  He has done this before and it again started downtrending when it reached 2.1.  I will continue to monitor and I will check a fractionated bilirubin at his next visit.  2.  Patient will get his first dose of Xgeva today for his bone metastases to the scapula.  He will return to clinic in 1 month with a CBC and Xgeva and I will see him back in 2 months with CBC CMP PSA for routine follow-up of his prostate cancer and Xgeva  3.  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  Mild grade 1.  Continue to monitor     Visit Diagnosis 1. Malignant neoplasm of prostate [facility])      Dr. Randa Evens, MD, MPH [facility] [phone] 12/22/2018 8:45 AM

## 2019-01-03 MED FILL — predniSONE 5 MG TABS: 5 | 30 days supply | Qty: 60 | Fill #3

## 2019-01-08 ENCOUNTER — Other Ambulatory Visit: Payer: Self-pay | Admitting: Oncology

## 2019-01-08 DIAGNOSIS — I480 Paroxysmal atrial fibrillation: Secondary | ICD-10-CM | POA: Diagnosis not present

## 2019-01-08 DIAGNOSIS — C61 Malignant neoplasm of prostate: Secondary | ICD-10-CM

## 2019-01-08 DIAGNOSIS — E538 Deficiency of other specified B group vitamins: Secondary | ICD-10-CM | POA: Diagnosis not present

## 2019-01-08 NOTE — Telephone Encounter (Signed)
)   Ref Range & Units 2wk ago  WBC 4.0 - 10.5 K/uL 4.8   RBC 4.22 - 5.81 MIL/uL 4.14Low    Hemoglobin 13.0 - 17.0 g/dL 12.8Low    HCT 39.0 - 52.0 % 38.6Low    MCV 80.0 - 100.0 fL 93.2   MCH 26.0 - 34.0 pg 30.9   MCHC 30.0 - 36.0 g/dL 33.2   RDW 11.5 - 15.5 % 14.2   Platelets 150 - 400 K/uL 149Low    nRBC 0.0 - 0.2 % 0.0   Neutrophils Relative % % 70   Neutro Abs 1.7 - 7.7 K/uL 3.3   Lymphocytes Relative % 20   Lymphs Abs 0.7 - 4.0 K/uL 1.0   Monocytes Relative % 9   Monocytes Absolute 0.1 - 1.0 K/uL 0.4   Eosinophils Relative % 1   Eosinophils Absolute 0.0 - 0.5 K/uL 0.1   Basophils Relative % 0   Basophils Absolute 0.0 - 0.1 K/uL 0.0   Immature Granulocytes % 0   Abs Immature Granulocytes 0.00 - 0.07 K/uL 0.01   Comment: Performed at [facility], [address] [place], [place] [address]  Resulting Agency  [facility]      Specimen Collected: 12/21/18 14:09  Last Resulted: 12/21/18 14:24     Lab Flowsheet    Order Details    View Encounter    Lab and Collection Details    Routing    Result History          Other Results from 12/21/2018   Contains abnormal data PSA  Order: [id number]   Status:  Final result  Visible to patient:  Yes (MyChart)  Next appt:  01/22/2019 at 02:45 PM in Oncology ([facility])  Dx:  Malignant neoplasm of prostate ([place])   Ref Range & Units 2wk ago  Prostatic Specific Antigen 0.00 - 4.00 ng/mL 7.34High    Comment: (NOTE)  While PSA levels of <=4.0 ng/ml are reported as reference range, some  men with levels below 4.0 ng/ml can have prostate cancer and many men  with PSA above 4.0 ng/ml do not have prostate cancer. Other tests  such as free PSA, age specific reference ranges, PSA velocity and PSA  doubling time may be helpful especially in men less than 40 years  old.  Performed at [facility], [place] [address], [place], [place]  [address]   Resulting Agency  [facility]      Specimen Collected: 12/21/18 14:09   Last Resulted: 12/21/18 20:00     Lab Flowsheet    Order Details    View Encounter    Lab and Collection Details    Routing    Result History            Contains abnormal data Comprehensive metabolic panel  Order: [id number]   Status:  Final result  Visible to patient:  Yes (MyChart)  Next appt:  01/22/2019 at 02:45 PM in Oncology ([facility])  Dx:  Malignant neoplasm of prostate ([place])   Ref Range & Units 2wk ago  Sodium 135 - 145 mmol/L 140   Potassium 3.5 - 5.1 mmol/L 3.3Low    Chloride 98 - 111 mmol/L 102   CO2 22 - 32 mmol/L 26   Glucose, Bld 70 - 99 mg/dL 131High    BUN 8 - 23 mg/dL 28High    Creatinine, Ser 0.61 - 1.24 mg/dL 1.16   Calcium 8.9 - 10.3 mg/dL 9.4   Total Protein  6.5 - 8.1 g/dL 6.8   Albumin 3.5 - 5.0 g/dL 4.2   AST 15 - 41 U/L 21   ALT 0 - 44 U/L 12   Alkaline Phosphatase 38 - 126 U/L 67   Total Bilirubin 0.3 - 1.2 mg/dL 1.9High    GFR calc non Af Amer >60 mL/min >60   GFR calc Af Amer >60 mL/min >60   Anion gap 5 - 15 12   Comment: Performed at [facility], [address] [place], [place] [address]  Resulting Agency  [facility] CLIN LAB      Specimen Collected: 12/21/18 14:09  Last Resulted: 12/21/18 14:35

## 2019-01-16 ENCOUNTER — Other Ambulatory Visit: Payer: Self-pay | Admitting: Nurse Practitioner

## 2019-01-18 MED FILL — ABIRATERONE ACETATE 250 MG: 250 | 30 days supply | Qty: 120 | Fill #0

## 2019-01-22 ENCOUNTER — Inpatient Hospital Stay: Payer: PPO

## 2019-01-22 ENCOUNTER — Other Ambulatory Visit: Payer: Self-pay | Admitting: *Deleted

## 2019-01-22 ENCOUNTER — Inpatient Hospital Stay: Payer: PPO | Attending: Oncology

## 2019-01-22 DIAGNOSIS — C61 Malignant neoplasm of prostate: Secondary | ICD-10-CM | POA: Diagnosis not present

## 2019-01-22 DIAGNOSIS — C7951 Secondary malignant neoplasm of bone: Secondary | ICD-10-CM | POA: Insufficient documentation

## 2019-01-22 DIAGNOSIS — C779 Secondary and unspecified malignant neoplasm of lymph node, unspecified: Secondary | ICD-10-CM | POA: Diagnosis not present

## 2019-01-22 DIAGNOSIS — E876 Hypokalemia: Secondary | ICD-10-CM

## 2019-01-22 DIAGNOSIS — E860 Dehydration: Secondary | ICD-10-CM

## 2019-01-22 LAB — COMPREHENSIVE METABOLIC PANEL
ALBUMIN: 4.2 g/dL (ref 3.5–5.0)
ALK PHOS: 69 U/L (ref 38–126)
ALT: 12 U/L (ref 0–44)
AST: 16 U/L (ref 15–41)
Anion gap: 7 (ref 5–15)
BILIRUBIN TOTAL: 1.5 mg/dL — AB (ref 0.3–1.2)
BUN: 32 mg/dL — AB (ref 8–23)
CO2: 28 mmol/L (ref 22–32)
Calcium: 8.5 mg/dL — ABNORMAL LOW (ref 8.9–10.3)
Chloride: 101 mmol/L (ref 98–111)
Creatinine, Ser: 1.01 mg/dL (ref 0.61–1.24)
GFR calc Af Amer: >60 mL/min (ref >60)
GFR calc non Af Amer: >60 mL/min (ref >60)
GLUCOSE: 105 mg/dL — AB (ref 70–99)
POTASSIUM: 2.8 mmol/L — AB (ref 3.5–5.1)
Sodium: 136 mmol/L (ref 135–145)
TOTAL PROTEIN: 6.7 g/dL (ref 6.5–8.1)

## 2019-01-22 MED ORDER — DENOSUMAB 120 MG/1.7ML ~~LOC~~ SOLN
120.0000 mg | SUBCUTANEOUS | Status: DC
  Administered 2019-01-22: 120 mg via SUBCUTANEOUS
  Filled 2019-01-22: qty 1.7

## 2019-01-22 MED ORDER — HEPARIN SOD (PORK) LOCK FLUSH 100 UNIT/ML IV SOLN
500.0000 [IU] | Freq: Once | INTRAVENOUS | Status: AC
  Administered 2019-01-22: 500 [IU] via INTRAVENOUS
  Filled 2019-01-22: qty 5

## 2019-01-22 MED ORDER — SODIUM CHLORIDE 0.9% FLUSH
10.0000 mL | Freq: Once | INTRAVENOUS | Status: AC
  Administered 2019-01-22: 10 mL via INTRAVENOUS
  Filled 2019-01-22: qty 10

## 2019-01-22 MED ORDER — POTASSIUM CHLORIDE CRYS ER 20 MEQ PO TBCR: 20.0000 meq | EXTENDED_RELEASE_TABLET | Freq: Two times a day (BID) | ORAL | 0 refills | Status: DC

## 2019-01-22 NOTE — Progress Notes (Unsigned)
bcr

## 2019-01-29 ENCOUNTER — Inpatient Hospital Stay: Payer: PPO

## 2019-01-29 DIAGNOSIS — C7951 Secondary malignant neoplasm of bone: Secondary | ICD-10-CM | POA: Diagnosis not present

## 2019-01-29 DIAGNOSIS — E876 Hypokalemia: Secondary | ICD-10-CM

## 2019-01-29 LAB — POTASSIUM: Potassium: 3.7 mmol/L (ref 3.5–5.1)

## 2019-02-02 MED FILL — predniSONE 5 MG TABS: 5 | 30 days supply | Qty: 60 | Fill #4

## 2019-02-08 DIAGNOSIS — E538 Deficiency of other specified B group vitamins: Secondary | ICD-10-CM | POA: Diagnosis not present

## 2019-02-08 DIAGNOSIS — I4891 Unspecified atrial fibrillation: Secondary | ICD-10-CM | POA: Diagnosis not present

## 2019-02-16 DIAGNOSIS — I420 Dilated cardiomyopathy: Secondary | ICD-10-CM | POA: Diagnosis not present

## 2019-02-16 DIAGNOSIS — C61 Malignant neoplasm of prostate: Secondary | ICD-10-CM | POA: Diagnosis not present

## 2019-02-16 DIAGNOSIS — Z86010 Personal history of colonic polyps: Secondary | ICD-10-CM | POA: Diagnosis not present

## 2019-02-16 DIAGNOSIS — I480 Paroxysmal atrial fibrillation: Secondary | ICD-10-CM | POA: Diagnosis not present

## 2019-02-16 MED FILL — ABIRATERONE ACETATE 250 MG: 250 | 30 days supply | Qty: 120 | Fill #1

## 2019-02-20 ENCOUNTER — Telehealth: Payer: Self-pay | Admitting: *Deleted

## 2019-02-20 ENCOUNTER — Inpatient Hospital Stay: Payer: PPO

## 2019-02-20 ENCOUNTER — Other Ambulatory Visit: Payer: Self-pay

## 2019-02-20 ENCOUNTER — Other Ambulatory Visit: Payer: Self-pay | Admitting: *Deleted

## 2019-02-20 ENCOUNTER — Inpatient Hospital Stay: Payer: PPO | Attending: Oncology | Admitting: Oncology

## 2019-02-20 VITALS — BP 138/84 | HR 80 | Temp 96.9°F | Resp 18 | Wt 263.0 lb

## 2019-02-20 DIAGNOSIS — C7951 Secondary malignant neoplasm of bone: Secondary | ICD-10-CM

## 2019-02-20 DIAGNOSIS — C779 Secondary and unspecified malignant neoplasm of lymph node, unspecified: Secondary | ICD-10-CM | POA: Diagnosis not present

## 2019-02-20 DIAGNOSIS — C61 Malignant neoplasm of prostate: Secondary | ICD-10-CM

## 2019-02-20 DIAGNOSIS — Z79899 Other long term (current) drug therapy: Secondary | ICD-10-CM

## 2019-02-20 DIAGNOSIS — E860 Dehydration: Secondary | ICD-10-CM

## 2019-02-20 LAB — FERRITIN: Ferritin: 120 ng/mL (ref 24–336)

## 2019-02-20 LAB — CBC WITH DIFFERENTIAL/PLATELET
Abs Immature Granulocytes: 0.01 10*3/uL (ref 0.00–0.07)
BASOS ABS: 0 10*3/uL (ref 0.0–0.1)
Basophils Relative: 0 %
EOS ABS: 0.1 10*3/uL (ref 0.0–0.5)
Eosinophils Relative: 1 %
HCT: 36.9 % — ABNORMAL LOW (ref 39.0–52.0)
Hemoglobin: 12.5 g/dL — ABNORMAL LOW (ref 13.0–17.0)
IMMATURE GRANULOCYTES: 0 %
LYMPHS ABS: 1 10*3/uL (ref 0.7–4.0)
Lymphocytes Relative: 17 %
MCH: 31.1 pg (ref 26.0–34.0)
MCHC: 33.9 g/dL (ref 30.0–36.0)
MCV: 91.8 fL (ref 80.0–100.0)
Monocytes Absolute: 0.4 10*3/uL (ref 0.1–1.0)
Monocytes Relative: 8 %
NEUTROS PCT: 74 %
NRBC: 0 % (ref 0.0–0.2)
Neutro Abs: 4 10*3/uL (ref 1.7–7.7)
Platelets: 145 10*3/uL — ABNORMAL LOW (ref 150–400)
RBC: 4.02 MIL/uL — ABNORMAL LOW (ref 4.22–5.81)
RDW: 14.2 % (ref 11.5–15.5)
WBC: 5.5 10*3/uL (ref 4.0–10.5)

## 2019-02-20 LAB — COMPREHENSIVE METABOLIC PANEL
ALT: 12 U/L (ref 0–44)
AST: 18 U/L (ref 15–41)
Albumin: 4.2 g/dL (ref 3.5–5.0)
Alkaline Phosphatase: 58 U/L (ref 38–126)
Anion gap: 10 (ref 5–15)
BUN: 38 mg/dL — ABNORMAL HIGH (ref 8–23)
CO2: 25 mmol/L (ref 22–32)
Calcium: 9.3 mg/dL (ref 8.9–10.3)
Chloride: 102 mmol/L (ref 98–111)
Creatinine, Ser: 1.5 mg/dL — ABNORMAL HIGH (ref 0.61–1.24)
GFR calc Af Amer: 54 mL/min — ABNORMAL LOW (ref >60)
GFR calc non Af Amer: 46 mL/min — ABNORMAL LOW (ref >60)
Glucose, Bld: 107 mg/dL — ABNORMAL HIGH (ref 70–99)
Potassium: 2.9 mmol/L — ABNORMAL LOW (ref 3.5–5.1)
SODIUM: 137 mmol/L (ref 135–145)
Total Bilirubin: 1.4 mg/dL — ABNORMAL HIGH (ref 0.3–1.2)
Total Protein: 6.8 g/dL (ref 6.5–8.1)

## 2019-02-20 LAB — IRON AND TIBC
Iron: 86 ug/dL (ref 45–182)
Saturation Ratios: 24 % (ref 17.9–39.5)
TIBC: 353 ug/dL (ref 250–450)
UIBC: 267 ug/dL

## 2019-02-20 LAB — PSA: Prostatic Specific Antigen: 6.3 ng/mL — ABNORMAL HIGH (ref 0.00–4.00)

## 2019-02-20 MED ORDER — DENOSUMAB 120 MG/1.7ML ~~LOC~~ SOLN
120.0000 mg | SUBCUTANEOUS | Status: DC
  Administered 2019-02-20: 120 mg via SUBCUTANEOUS
  Filled 2019-02-20: qty 1.7

## 2019-02-20 MED ORDER — POTASSIUM CHLORIDE CRYS ER 20 MEQ PO TBCR: 20.0000 meq | EXTENDED_RELEASE_TABLET | Freq: Two times a day (BID) | ORAL | 0 refills | Status: DC

## 2019-02-20 NOTE — Progress Notes (Signed)
Here for follow up. Per pt "getting over a cold,and feeling fine "  No sore throat/ coughing is intermittent per pt  Has had cold 4 w he stated.  ( no coughing noted )

## 2019-02-20 NOTE — Telephone Encounter (Signed)
Spoke to patient and he needs cbc, ferritin and IIBC done from the [facility]. Dr. Janese Banks said it was fine and it was added on and patient aware

## 2019-02-20 NOTE — Telephone Encounter (Signed)
Patient called rambling about Amber with GI calling and him needing a colonoscopy and he needs a thing from [place], and him and him wanting to have labs drawn CBC, IIBC, Ferr and it all be on the same day today. He has an appointment today at 2. He wants a return call to let him know this is done prior to his appointment today

## 2019-02-26 ENCOUNTER — Encounter: Payer: Self-pay | Admitting: Oncology

## 2019-02-26 ENCOUNTER — Telehealth: Payer: Self-pay | Admitting: Pharmacy Technician

## 2019-02-26 NOTE — Progress Notes (Signed)
Hematology/Oncology Consult note [facility]  Telephone:([phone] [phone]  Patient Care Team: Rusty Aus, MD as PCP - General (Internal Medicine)   Name of the patient: Jeremy Carney  [id number]  [birth year]   Date of visit: 02/26/19  Diagnosis- metastatic castrate resistant prostate cancer with LN metsand met to scapula  Chief complaint/ Reason for visit-routine follow-up of prostate cancer on Zytiga  Heme/Onc history:  Patient is a73 year old gentleman with a prior history of prostate cancer in 1999 status post radical prostatectomy. He had biochemical recurrence in 2017 and was treated with IM RT. He again began to have a rising PSA in 2010 and underwent orchiectomy in 2010.his PSA was being monitored closely PSA a year ago was 0.5, 5 months ago was 3.9 in 2 months ago was 7.8.he underwent CT abdomen and pelvis in May 2018which showed abnormal periaortic and right common iliac lymph nodes. Index aortocaval lymph node 1.4 cm which was new as compared to February 2014 and suspicious for malignancy. Bone scan did not reveal any evidence of bony metastatic disease. He has been referred to [place] for systemic therapy options for CRPC  Patient lives with his wife and is independent of his ADL's. He has A fib for which he is on coumadin. Also has chronic urinary incontinence from prior prostate surgery and has AUS. Reports 50 pound intentional weight loss since October 2017  Repeat PSA in July 2018 was 13.3 indicating PSA doubling time of 3 months  Docetaxel chemo initiated for CRPC with ln mets and rapid doubling PSA in august 2018.Patient completed 5 cycles of docetaxel on 09/27/2017. Cycle #6 was not given due to progressive fatigue and cytopenias as well as worsening neuropathy. Cycle 5 of docetaxel was dose reduced to 60 mg/m square  In April 2019 patient noted to have rising PSA and increase in the size of retroperitoneal LN. Patient  therefore started second line zytiga   Interval history- patient is doing well. Denies any significant side effects with zytiga. Leg swelling has been stable  ECOG PS- 1 Pain scale- 0   Review of systems- Review of Systems  Constitutional: Negative for chills, fever, malaise/fatigue and weight loss.  HENT: Negative for congestion, ear discharge and nosebleeds.   Eyes: Negative for blurred vision.  Respiratory: Negative for cough, hemoptysis, sputum production, shortness of breath and wheezing.   Cardiovascular: Negative for chest pain, palpitations, orthopnea and claudication.  Gastrointestinal: Negative for abdominal pain, blood in stool, constipation, diarrhea, heartburn, melena, nausea and vomiting.  Genitourinary: Negative for dysuria, flank pain, frequency, hematuria and urgency.  Musculoskeletal: Negative for back pain, joint pain and myalgias.  Skin: Negative for rash.  Neurological: Negative for dizziness, tingling, focal weakness, seizures, weakness and headaches.  Endo/Heme/Allergies: Does not bruise/bleed easily.  Psychiatric/Behavioral: Negative for depression and suicidal ideas. The patient does not have insomnia.        Allergies  Allergen Reactions  . Latex Rash    Sensitivity, not allergy.     Past Medical History:  Diagnosis Date  . Arthritis, degenerative 10/30/2015  . Atrial fibrillation ([place]) 10/30/2015  . Bladder spasm 06/12/2013  . Carcinoma of prostate ([place]) 10/30/2015  . Cardiomyopathy ([place]) 09/10/2014   Overview:  EF 35%,4/15   . Cerebrovascular accident (CVA) ([place]) 10/30/2015   Overview:  Left embolic LEX,5170   . Difficult or painful urination 11/21/2013  . Dysrhythmia   . Gout 10/30/2015  . History of kidney stones   . Malignant neoplasm of  prostate ([place]) 09/05/2012  . Sinoatrial node dysfunction (HCC) 10/30/2015   Overview:  DDD pacemaker, 1996      Past Surgical History:  Procedure Laterality Date  . castration  10/2009  . CATARACT  EXTRACTION W/ INTRAOCULAR LENS  IMPLANT, BILATERAL Bilateral 2016   right eye done then the left one done 3 months later in 2016  . INGUINAL HERNIA REPAIR Left   . INSERT / REPLACE / REMOVE PACEMAKER    . IR FLUORO GUIDE PORT INSERTION RIGHT  07/01/2017  . JOINT REPLACEMENT Bilateral 1980   hips  . left knee replacement  2014  . PACEMAKER INSERTION    . PROSTATECTOMY    . REPLACEMENT TOTAL HIP W/  RESURFACING IMPLANTS  1997   bilateral-   . REPLACEMENT TOTAL KNEE Bilateral   . testes removal     per pt   . TONSILLECTOMY AND ADENOIDECTOMY    . TRANSURETHRAL RESECTION OF BLADDER TUMOR N/A 12/31/2015   Procedure: BLADDER BIOPSY;  Surgeon: Nickie Retort, MD;  Location: [facility] ORS;  Service: Urology;  Laterality: N/A;  . Urinary incontinence valve     AMS    Social History   Socioeconomic History  . Marital status: Married    Spouse name: Not on file  . Number of children: Not on file  . Years of education: Not on file  . Highest education level: Not on file  Occupational History  . Not on file  Social Needs  . Financial resource strain: Not on file  . Food insecurity:    Worry: Not on file    Inability: Not on file  . Transportation needs:    Medical: Not on file    Non-medical: Not on file  Tobacco Use  . Smoking status: Never Smoker  . Smokeless tobacco: Never Used  Substance and Sexual Activity  . Alcohol use: No    Alcohol/week: 0.0 standard drinks  . Drug use: No  . Sexual activity: Never  Lifestyle  . Physical activity:    Days per week: Not on file    Minutes per session: Not on file  . Stress: Not on file  Relationships  . Social connections:    Talks on phone: Not on file    Gets together: Not on file    Attends religious service: Not on file    Active member of club or organization: Not on file    Attends meetings of clubs or organizations: Not on file    Relationship status: Not on file  . Intimate partner violence:    Fear of current or ex  partner: Not on file    Emotionally abused: Not on file    Physically abused: Not on file    Forced sexual activity: Not on file  Other Topics Concern  . Not on file  Social History Narrative  . Not on file    Family History  Problem Relation Age of Onset  . Cancer Mother   . Arthritis Mother   . Heart attack Father   . Arthritis Father   . Transient ischemic attack Brother   . Prostate cancer Neg Hx   . Bladder Cancer Neg Hx      Current Outpatient Medications:  .  Abiraterone Acetate (ZYTIGA PO), Take 4 capsules by mouth daily. , Disp: , Rfl:  .  allopurinol (ZYLOPRIM) 300 MG tablet, Take by mouth daily. In am., Disp: , Rfl:  .  hydrochlorothiazide (HYDRODIURIL) 25 MG tablet, Take 25 mg by  mouth. In am, Disp: , Rfl:  .  predniSONE (DELTASONE) 5 MG tablet, TAKE 1 TABLET (5 MG TOTAL) BY MOUTH 2 TIMES DAILY WITH A MEAL., Disp: 60 tablet, Rfl: 5 .  simvastatin (ZOCOR) 40 MG tablet, Take 40 mg by mouth daily at 6 PM. , Disp: , Rfl:  .  warfarin (COUMADIN) 2 MG tablet, Take 2 mg by mouth. , Disp: , Rfl:  .  warfarin (COUMADIN) 5 MG tablet, , Disp: , Rfl:  .  acetaminophen (TYLENOL) 325 MG tablet, Take 650 mg by mouth 2 (two) times daily., Disp: , Rfl:  .  lidocaine-prilocaine (EMLA) cream, , Disp: , Rfl:  .  phentermine 15 MG capsule, Take 15 mg by mouth every morning., Disp: , Rfl:  .  potassium chloride SA (K-DUR,KLOR-CON) 20 MEQ tablet, Take 1 tablet (20 mEq total) by mouth 2 (two) times daily., Disp: 20 tablet, Rfl: 0  Physical exam:  Vitals:   02/20/19 1421  BP: 138/84  Pulse: 80  Resp: 18  Temp: (!) 96.9 F (36.1 C)  TempSrc: Tympanic  Weight: 263 lb (119.3 kg)   Physical Exam Constitutional:      General: He is not in acute distress.    Appearance: He is obese.  HENT:     Head: Normocephalic and atraumatic.  Eyes:     Pupils: Pupils are equal, round, and reactive to light.  Neck:     Musculoskeletal: Normal range of motion.  Cardiovascular:     Rate and  Rhythm: Normal rate and regular rhythm.     Heart sounds: Normal heart sounds.  Pulmonary:     Effort: Pulmonary effort is normal.     Breath sounds: Normal breath sounds.  Abdominal:     General: Bowel sounds are normal.     Palpations: Abdomen is soft.  Skin:    General: Skin is warm and dry.  Neurological:     Mental Status: He is alert and oriented to person, place, and time.      CMP Latest Ref Rng & Units 02/20/2019  Glucose 70 - 99 mg/dL 107(H)  BUN 8 - 23 mg/dL 38(H)  Creatinine 0.61 - 1.24 mg/dL 1.50(H)  Sodium 135 - 145 mmol/L 137  Potassium 3.5 - 5.1 mmol/L 2.9(L)  Chloride 98 - 111 mmol/L 102  CO2 22 - 32 mmol/L 25  Calcium 8.9 - 10.3 mg/dL 9.3  Total Protein 6.5 - 8.1 g/dL 6.8  Total Bilirubin 0.3 - 1.2 mg/dL 1.4(H)  Alkaline Phos 38 - 126 U/L 58  AST 15 - 41 U/L 18  ALT 0 - 44 U/L 12   CBC Latest Ref Rng & Units 02/20/2019  WBC 4.0 - 10.5 K/uL 5.5  Hemoglobin 13.0 - 17.0 g/dL 12.5(L)  Hematocrit 39.0 - 52.0 % 36.9(L)  Platelets 150 - 400 K/uL 145(L)     Assessment and plan- Patient is a 73 y.o. male with history of castrate resistant metastatic prostate cancer with metastases to the lymph nodes and the scapula.  he is here for routine f/u of prostate cancer on zytiga  Patient is responding well to [facility] and his PSA is slowly trending down.  6.3 today as compared to 7.32 months ago.  CBC is unremarkable.  He does have mildly elevated bilirubin which fluctuates between 1.3-1.9.  Continue to monitor.  He will get Xgeva today  CBC CMP and PSA in 1 month.  Gets Xgeva CBC CMP and PSA in 2 months.  Sees MD and gets Delton See  Visit Diagnosis 1. Malignant neoplasm of prostate ([place])   2. High risk medication use      Dr. Randa Evens, MD, MPH [facility] [phone] 02/26/2019 8:08 AM

## 2019-02-26 NOTE — Telephone Encounter (Signed)
Oral Oncology Patient Advocate Encounter  Patient has been approved for copay assistance with [employer] (TAF).  The [place] will cover all copayment expenses for Zytiga for the remainder of the calendar year.    The billing information is as follows and has been shared with [place].   Member ID: [id number] Group ID: [id number] PCN: [id number] BIN: [id number] Eligibility Dates: 12/13/2018 to 12/13/2019  [place] Patient [place] Phone [phone] Fax [phone] 02/26/2019 3:59 PM

## 2019-03-01 ENCOUNTER — Other Ambulatory Visit: Payer: Self-pay

## 2019-03-01 ENCOUNTER — Inpatient Hospital Stay: Payer: PPO

## 2019-03-01 DIAGNOSIS — C61 Malignant neoplasm of prostate: Secondary | ICD-10-CM

## 2019-03-01 DIAGNOSIS — C7951 Secondary malignant neoplasm of bone: Secondary | ICD-10-CM | POA: Diagnosis not present

## 2019-03-01 LAB — BASIC METABOLIC PANEL
Anion gap: 8 (ref 5–15)
BUN: 30 mg/dL — ABNORMAL HIGH (ref 8–23)
CO2: 26 mmol/L (ref 22–32)
CREATININE: 1.1 mg/dL (ref 0.61–1.24)
Calcium: 9.1 mg/dL (ref 8.9–10.3)
Chloride: 106 mmol/L (ref 98–111)
GFR calc Af Amer: >60 mL/min (ref >60)
GFR calc non Af Amer: >60 mL/min (ref >60)
Glucose, Bld: 106 mg/dL — ABNORMAL HIGH (ref 70–99)
Potassium: 4.4 mmol/L (ref 3.5–5.1)
SODIUM: 140 mmol/L (ref 135–145)

## 2019-03-03 ENCOUNTER — Other Ambulatory Visit: Payer: Self-pay | Admitting: Oncology

## 2019-03-05 NOTE — Telephone Encounter (Signed)
)     Ref Range & Units 4d ago  Potassium 3.5 - 5.1 mmol/L 4.4

## 2019-03-09 MED FILL — predniSONE 5 MG TABS: 5 | 30 days supply | Qty: 60 | Fill #5

## 2019-03-09 MED FILL — ABIRATERONE ACETATE 250 MG: 250 | 30 days supply | Qty: 120 | Fill #2

## 2019-03-15 DIAGNOSIS — G620 Drug-induced polyneuropathy: Secondary | ICD-10-CM | POA: Diagnosis not present

## 2019-03-15 DIAGNOSIS — K219 Gastro-esophageal reflux disease without esophagitis: Secondary | ICD-10-CM | POA: Diagnosis not present

## 2019-03-15 DIAGNOSIS — S71152A Open bite, left thigh, initial encounter: Secondary | ICD-10-CM | POA: Diagnosis not present

## 2019-03-15 DIAGNOSIS — I6930 Unspecified sequelae of cerebral infarction: Secondary | ICD-10-CM | POA: Diagnosis not present

## 2019-03-15 DIAGNOSIS — F419 Anxiety disorder, unspecified: Secondary | ICD-10-CM | POA: Diagnosis not present

## 2019-03-15 DIAGNOSIS — I249 Acute ischemic heart disease, unspecified: Secondary | ICD-10-CM | POA: Diagnosis not present

## 2019-03-15 DIAGNOSIS — S52501A Unspecified fracture of the lower end of right radius, initial encounter for closed fracture: Secondary | ICD-10-CM | POA: Diagnosis not present

## 2019-03-15 DIAGNOSIS — N390 Urinary tract infection, site not specified: Secondary | ICD-10-CM | POA: Diagnosis not present

## 2019-03-15 DIAGNOSIS — Z125 Encounter for screening for malignant neoplasm of prostate: Secondary | ICD-10-CM | POA: Diagnosis not present

## 2019-03-15 DIAGNOSIS — I2510 Atherosclerotic heart disease of native coronary artery without angina pectoris: Secondary | ICD-10-CM | POA: Diagnosis not present

## 2019-03-15 DIAGNOSIS — I482 Chronic atrial fibrillation, unspecified: Secondary | ICD-10-CM | POA: Diagnosis not present

## 2019-03-15 DIAGNOSIS — M5442 Lumbago with sciatica, left side: Secondary | ICD-10-CM | POA: Diagnosis not present

## 2019-03-15 DIAGNOSIS — S0101XA Laceration without foreign body of scalp, initial encounter: Secondary | ICD-10-CM | POA: Diagnosis not present

## 2019-03-15 DIAGNOSIS — G8929 Other chronic pain: Secondary | ICD-10-CM | POA: Diagnosis not present

## 2019-03-15 DIAGNOSIS — M25531 Pain in right wrist: Secondary | ICD-10-CM | POA: Diagnosis not present

## 2019-03-15 DIAGNOSIS — R110 Nausea: Secondary | ICD-10-CM | POA: Diagnosis not present

## 2019-03-15 DIAGNOSIS — E782 Mixed hyperlipidemia: Secondary | ICD-10-CM | POA: Diagnosis not present

## 2019-03-15 DIAGNOSIS — Z0000 Encounter for general adult medical examination without abnormal findings: Secondary | ICD-10-CM | POA: Diagnosis not present

## 2019-03-15 DIAGNOSIS — Z23 Encounter for immunization: Secondary | ICD-10-CM | POA: Diagnosis not present

## 2019-03-15 DIAGNOSIS — E538 Deficiency of other specified B group vitamins: Secondary | ICD-10-CM | POA: Diagnosis not present

## 2019-03-15 DIAGNOSIS — I110 Hypertensive heart disease with heart failure: Secondary | ICD-10-CM | POA: Diagnosis not present

## 2019-03-15 DIAGNOSIS — R569 Unspecified convulsions: Secondary | ICD-10-CM | POA: Diagnosis not present

## 2019-03-15 DIAGNOSIS — M1A00X0 Idiopathic chronic gout, unspecified site, without tophus (tophi): Secondary | ICD-10-CM | POA: Diagnosis not present

## 2019-03-15 DIAGNOSIS — T451X5A Adverse effect of antineoplastic and immunosuppressive drugs, initial encounter: Secondary | ICD-10-CM | POA: Diagnosis not present

## 2019-03-15 DIAGNOSIS — K222 Esophageal obstruction: Secondary | ICD-10-CM | POA: Diagnosis not present

## 2019-03-15 DIAGNOSIS — S62001A Unspecified fracture of navicular [scaphoid] bone of right wrist, initial encounter for closed fracture: Secondary | ICD-10-CM | POA: Diagnosis not present

## 2019-03-15 DIAGNOSIS — I10 Essential (primary) hypertension: Secondary | ICD-10-CM | POA: Diagnosis not present

## 2019-03-15 DIAGNOSIS — L89153 Pressure ulcer of sacral region, stage 3: Secondary | ICD-10-CM | POA: Diagnosis not present

## 2019-03-15 DIAGNOSIS — F17210 Nicotine dependence, cigarettes, uncomplicated: Secondary | ICD-10-CM | POA: Diagnosis not present

## 2019-03-15 DIAGNOSIS — E119 Type 2 diabetes mellitus without complications: Secondary | ICD-10-CM | POA: Diagnosis not present

## 2019-03-15 DIAGNOSIS — K449 Diaphragmatic hernia without obstruction or gangrene: Secondary | ICD-10-CM | POA: Diagnosis not present

## 2019-03-15 DIAGNOSIS — N289 Disorder of kidney and ureter, unspecified: Secondary | ICD-10-CM | POA: Diagnosis not present

## 2019-03-15 DIAGNOSIS — R1013 Epigastric pain: Secondary | ICD-10-CM | POA: Diagnosis not present

## 2019-03-15 DIAGNOSIS — R1310 Dysphagia, unspecified: Secondary | ICD-10-CM | POA: Diagnosis not present

## 2019-03-15 DIAGNOSIS — I5020 Unspecified systolic (congestive) heart failure: Secondary | ICD-10-CM | POA: Diagnosis not present

## 2019-03-15 DIAGNOSIS — I509 Heart failure, unspecified: Secondary | ICD-10-CM | POA: Diagnosis not present

## 2019-03-20 DIAGNOSIS — I480 Paroxysmal atrial fibrillation: Secondary | ICD-10-CM | POA: Diagnosis not present

## 2019-03-23 ENCOUNTER — Other Ambulatory Visit: Payer: Self-pay

## 2019-03-23 ENCOUNTER — Inpatient Hospital Stay: Payer: PPO

## 2019-03-23 ENCOUNTER — Inpatient Hospital Stay: Payer: PPO | Attending: Oncology

## 2019-03-23 DIAGNOSIS — C779 Secondary and unspecified malignant neoplasm of lymph node, unspecified: Secondary | ICD-10-CM | POA: Insufficient documentation

## 2019-03-23 DIAGNOSIS — E860 Dehydration: Secondary | ICD-10-CM

## 2019-03-23 DIAGNOSIS — C801 Malignant (primary) neoplasm, unspecified: Secondary | ICD-10-CM

## 2019-03-23 DIAGNOSIS — C61 Malignant neoplasm of prostate: Secondary | ICD-10-CM

## 2019-03-23 DIAGNOSIS — C7951 Secondary malignant neoplasm of bone: Secondary | ICD-10-CM | POA: Diagnosis not present

## 2019-03-23 LAB — COMPREHENSIVE METABOLIC PANEL
ALT: 10 U/L (ref 0–44)
AST: 20 U/L (ref 15–41)
Albumin: 4.1 g/dL (ref 3.5–5.0)
Alkaline Phosphatase: 52 U/L (ref 38–126)
Anion gap: 10 (ref 5–15)
BUN: 40 mg/dL — ABNORMAL HIGH (ref 8–23)
CO2: 24 mmol/L (ref 22–32)
Calcium: 9.5 mg/dL (ref 8.9–10.3)
Chloride: 103 mmol/L (ref 98–111)
Creatinine, Ser: 1.35 mg/dL — ABNORMAL HIGH (ref 0.61–1.24)
GFR calc Af Amer: >60 mL/min (ref >60)
GFR calc non Af Amer: 53 mL/min — ABNORMAL LOW (ref >60)
Glucose, Bld: 148 mg/dL — ABNORMAL HIGH (ref 70–99)
Potassium: 3.2 mmol/L — ABNORMAL LOW (ref 3.5–5.1)
Sodium: 137 mmol/L (ref 135–145)
Total Bilirubin: 1.8 mg/dL — ABNORMAL HIGH (ref 0.3–1.2)
Total Protein: 6.6 g/dL (ref 6.5–8.1)

## 2019-03-23 LAB — CBC WITH DIFFERENTIAL/PLATELET
Abs Immature Granulocytes: 0.01 10*3/uL (ref 0.00–0.07)
Basophils Absolute: 0 10*3/uL (ref 0.0–0.1)
Basophils Relative: 0 %
Eosinophils Absolute: 0.1 10*3/uL (ref 0.0–0.5)
Eosinophils Relative: 1 %
HCT: 35.6 % — ABNORMAL LOW (ref 39.0–52.0)
Hemoglobin: 12.3 g/dL — ABNORMAL LOW (ref 13.0–17.0)
Immature Granulocytes: 0 %
Lymphocytes Relative: 12 %
Lymphs Abs: 0.8 10*3/uL (ref 0.7–4.0)
MCH: 31.8 pg (ref 26.0–34.0)
MCHC: 34.6 g/dL (ref 30.0–36.0)
MCV: 92 fL (ref 80.0–100.0)
Monocytes Absolute: 0.5 10*3/uL (ref 0.1–1.0)
Monocytes Relative: 8 %
Neutro Abs: 4.9 10*3/uL (ref 1.7–7.7)
Neutrophils Relative %: 79 %
Platelets: 130 10*3/uL — ABNORMAL LOW (ref 150–400)
RBC: 3.87 MIL/uL — ABNORMAL LOW (ref 4.22–5.81)
RDW: 14.1 % (ref 11.5–15.5)
WBC: 6.2 10*3/uL (ref 4.0–10.5)
nRBC: 0 % (ref 0.0–0.2)

## 2019-03-23 LAB — PSA: Prostatic Specific Antigen: 6.1 ng/mL — ABNORMAL HIGH (ref 0.00–4.00)

## 2019-03-23 MED ORDER — HEPARIN SOD (PORK) LOCK FLUSH 100 UNIT/ML IV SOLN
500.0000 [IU] | Freq: Once | INTRAVENOUS | Status: AC
  Administered 2019-03-23: 15:00:00 500 [IU] via INTRAVENOUS

## 2019-03-23 MED ORDER — SODIUM CHLORIDE 0.9% FLUSH
10.0000 mL | INTRAVENOUS | Status: DC | PRN
  Administered 2019-03-23: 15:00:00 10 mL via INTRAVENOUS
  Filled 2019-03-23: qty 10

## 2019-03-23 MED ORDER — DENOSUMAB 120 MG/1.7ML ~~LOC~~ SOLN
120.0000 mg | SUBCUTANEOUS | Status: DC
  Administered 2019-03-23: 15:00:00 120 mg via SUBCUTANEOUS
  Filled 2019-03-23: qty 1.7

## 2019-03-26 ENCOUNTER — Other Ambulatory Visit: Payer: Self-pay | Admitting: *Deleted

## 2019-03-26 ENCOUNTER — Telehealth: Payer: Self-pay | Admitting: *Deleted

## 2019-03-26 MED ORDER — POTASSIUM CHLORIDE CRYS ER 10 MEQ PO TBCR: 20.0000 meq | EXTENDED_RELEASE_TABLET | Freq: Two times a day (BID) | ORAL | 0 refills | Status: DC

## 2019-03-26 NOTE — Telephone Encounter (Signed)
I called patient to let him know that potassium was low and we need to refill another rx for potassium and he states that it is difficult to swallow and is there another pill he can take that is not as big. I told him the 10 meq are little smaller but we can try. But that means he will have to take 2 pills twice a day and he is ok with that. Also creat. Is elevated and educated him on drinking more water. He is agreeable to plan and I sent new rx for 10 meq of potassium to his pharmacy

## 2019-03-26 NOTE — Telephone Encounter (Signed)
-----   Message from Sindy Guadeloupe, MD sent at 03/24/2019  1:05 PM EDT ----- Please ask him to improve oral fluid intake and take po potassium 20 daily. Thanks, Astrid Divine

## 2019-03-31 ENCOUNTER — Other Ambulatory Visit: Payer: Self-pay | Admitting: Oncology

## 2019-03-31 DIAGNOSIS — C61 Malignant neoplasm of prostate: Secondary | ICD-10-CM

## 2019-04-05 MED FILL — predniSONE 5 MG TABS: 5 | 30 days supply | Qty: 60 | Fill #0

## 2019-04-05 MED FILL — ABIRATERONE ACETATE 250 MG: 250 | 30 days supply | Qty: 120 | Fill #3

## 2019-04-16 DIAGNOSIS — E538 Deficiency of other specified B group vitamins: Secondary | ICD-10-CM | POA: Diagnosis not present

## 2019-04-16 DIAGNOSIS — I4891 Unspecified atrial fibrillation: Secondary | ICD-10-CM | POA: Diagnosis not present

## 2019-04-18 ENCOUNTER — Ambulatory Visit: Admit: 2019-04-18 | Payer: PPO | Admitting: Unknown Physician Specialty

## 2019-04-18 SURGERY — COLONOSCOPY WITH PROPOFOL
Anesthesia: General

## 2019-04-23 ENCOUNTER — Other Ambulatory Visit: Payer: Self-pay

## 2019-04-24 ENCOUNTER — Encounter: Payer: Self-pay | Admitting: Oncology

## 2019-04-24 ENCOUNTER — Inpatient Hospital Stay: Payer: PPO | Attending: Oncology | Admitting: Oncology

## 2019-04-24 ENCOUNTER — Inpatient Hospital Stay: Payer: PPO

## 2019-04-24 ENCOUNTER — Other Ambulatory Visit: Payer: Self-pay

## 2019-04-24 VITALS — BP 153/88 | HR 88 | Temp 97.9°F | Resp 18 | Ht 72.0 in | Wt 257.9 lb

## 2019-04-24 DIAGNOSIS — C7951 Secondary malignant neoplasm of bone: Secondary | ICD-10-CM | POA: Diagnosis not present

## 2019-04-24 DIAGNOSIS — Z79899 Other long term (current) drug therapy: Secondary | ICD-10-CM | POA: Insufficient documentation

## 2019-04-24 DIAGNOSIS — I4891 Unspecified atrial fibrillation: Secondary | ICD-10-CM | POA: Insufficient documentation

## 2019-04-24 DIAGNOSIS — G629 Polyneuropathy, unspecified: Secondary | ICD-10-CM | POA: Insufficient documentation

## 2019-04-24 DIAGNOSIS — E860 Dehydration: Secondary | ICD-10-CM

## 2019-04-24 DIAGNOSIS — C779 Secondary and unspecified malignant neoplasm of lymph node, unspecified: Secondary | ICD-10-CM | POA: Diagnosis not present

## 2019-04-24 DIAGNOSIS — Z7901 Long term (current) use of anticoagulants: Secondary | ICD-10-CM | POA: Insufficient documentation

## 2019-04-24 DIAGNOSIS — R32 Unspecified urinary incontinence: Secondary | ICD-10-CM | POA: Insufficient documentation

## 2019-04-24 DIAGNOSIS — C61 Malignant neoplasm of prostate: Secondary | ICD-10-CM | POA: Diagnosis not present

## 2019-04-24 DIAGNOSIS — Z950 Presence of cardiac pacemaker: Secondary | ICD-10-CM | POA: Diagnosis not present

## 2019-04-24 DIAGNOSIS — Z5181 Encounter for therapeutic drug level monitoring: Secondary | ICD-10-CM

## 2019-04-24 LAB — CBC WITH DIFFERENTIAL/PLATELET
Abs Immature Granulocytes: 0.01 10*3/uL (ref 0.00–0.07)
Basophils Absolute: 0 10*3/uL (ref 0.0–0.1)
Basophils Relative: 0 %
Eosinophils Absolute: 0.1 10*3/uL (ref 0.0–0.5)
Eosinophils Relative: 2 %
HCT: 41.3 % (ref 39.0–52.0)
Hemoglobin: 14 g/dL (ref 13.0–17.0)
Immature Granulocytes: 0 %
Lymphocytes Relative: 23 %
Lymphs Abs: 1.1 10*3/uL (ref 0.7–4.0)
MCH: 31.3 pg (ref 26.0–34.0)
MCHC: 33.9 g/dL (ref 30.0–36.0)
MCV: 92.4 fL (ref 80.0–100.0)
Monocytes Absolute: 0.4 10*3/uL (ref 0.1–1.0)
Monocytes Relative: 8 %
Neutro Abs: 3.2 10*3/uL (ref 1.7–7.7)
Neutrophils Relative %: 67 %
Platelets: 130 10*3/uL — ABNORMAL LOW (ref 150–400)
RBC: 4.47 MIL/uL (ref 4.22–5.81)
RDW: 13.8 % (ref 11.5–15.5)
WBC: 4.8 10*3/uL (ref 4.0–10.5)
nRBC: 0 % (ref 0.0–0.2)

## 2019-04-24 LAB — COMPREHENSIVE METABOLIC PANEL
ALT: 12 U/L (ref 0–44)
AST: 21 U/L (ref 15–41)
Albumin: 4.6 g/dL (ref 3.5–5.0)
Alkaline Phosphatase: 54 U/L (ref 38–126)
Anion gap: 13 (ref 5–15)
BUN: 40 mg/dL — ABNORMAL HIGH (ref 8–23)
CO2: 26 mmol/L (ref 22–32)
Calcium: 10.1 mg/dL (ref 8.9–10.3)
Chloride: 97 mmol/L — ABNORMAL LOW (ref 98–111)
Creatinine, Ser: 1.11 mg/dL (ref 0.61–1.24)
GFR calc Af Amer: >60 mL/min (ref >60)
GFR calc non Af Amer: >60 mL/min (ref >60)
Glucose, Bld: 93 mg/dL (ref 70–99)
Potassium: 3.2 mmol/L — ABNORMAL LOW (ref 3.5–5.1)
Sodium: 136 mmol/L (ref 135–145)
Total Bilirubin: 1.8 mg/dL — ABNORMAL HIGH (ref 0.3–1.2)
Total Protein: 7.4 g/dL (ref 6.5–8.1)

## 2019-04-24 LAB — PSA: Prostatic Specific Antigen: 7.18 ng/mL — ABNORMAL HIGH (ref 0.00–4.00)

## 2019-04-24 MED ORDER — DENOSUMAB 120 MG/1.7ML ~~LOC~~ SOLN
120.0000 mg | SUBCUTANEOUS | Status: DC
  Administered 2019-04-24: 120 mg via SUBCUTANEOUS
  Filled 2019-04-24: qty 1.7

## 2019-04-24 MED ORDER — POTASSIUM CHLORIDE CRYS ER 10 MEQ PO TBCR: 20.0000 meq | EXTENDED_RELEASE_TABLET | Freq: Every day | ORAL | 1 refills | Status: DC

## 2019-04-24 NOTE — Progress Notes (Signed)
No concerns in regards of prostate cancer

## 2019-04-24 NOTE — Progress Notes (Signed)
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 He had biochemical recurrence in 2017 and was treated with IM RT. He again began to have a rising PSA in 2010 and underwent orchiectomy in 2010.his PSA was being monitored closely PSA a year ago was 0.5, 5 months ago was 3.9 in 2 months ago was 7.8.he underwent CT abdomen and pelvis in May 2018which showed abnormal periaortic and right common iliac lymph nodes. Index aortocaval lymph node 1.4 cm which was new as compared to February 2014 and suspicious for malignancy. Bone scan did not reveal any evidence of bony metastatic disease. He has been referred to [place] for systemic therapy options for CRPC  Patient lives with his wife and is independent of his ADL's. He has A fib for which he is on coumadin. Also has chronic urinary incontinence from prior prostate surgery and has AUS. Reports 50 pound intentional weight loss since October 2017  Repeat PSA in July 2018 was 13.3 indicating PSA doubling time of 3 months  Docetaxel chemo initiated for CRPC with ln mets and rapid doubling PSA in august 2018.Patient completed 5 cycles of docetaxel on 09/27/2017. Cycle #6 was not given due to progressive fatigue and cytopenias as well as worsening neuropathy. Cycle 5 of docetaxel was dose reduced to 60 mg/m square  In April 2019 patient noted to have rising PSA and increase in the size of retroperitoneal LN. Patient  therefore started second line zytiga   Interval history-he has chronic mild peripheral neuropathy especially in his feet from prior chemotherapy which is stable.  Overall tolerating Zytiga well and denies any new complaints at this time  ECOG PS- 1 Pain scale- 0 Opioid associated constipation- no  Review of systems- Review of Systems  Constitutional: Positive for malaise/fatigue. Negative for chills, fever and weight loss.  HENT: Negative for congestion, ear discharge and nosebleeds.   Eyes: Negative for blurred vision.  Respiratory: Negative for cough, hemoptysis, sputum production, shortness of breath and wheezing.   Cardiovascular: Negative for chest pain, palpitations, orthopnea and claudication.  Gastrointestinal: Negative for abdominal pain, blood in stool, constipation, diarrhea, heartburn, melena, nausea and vomiting.  Genitourinary: Negative for dysuria, flank pain, frequency, hematuria and urgency.  Musculoskeletal: Negative for back pain, joint pain and myalgias.  Skin: Negative for rash.  Neurological: Negative for dizziness, tingling, focal weakness, seizures, weakness and headaches.  Endo/Heme/Allergies: Does not bruise/bleed easily.  Psychiatric/Behavioral: Negative for depression and suicidal ideas. The patient does not have insomnia.      Allergies  Allergen Reactions  . Latex Rash    Sensitivity, not allergy.     Past Medical History:  Diagnosis Date  . Arthritis, degenerative 10/30/2015  . Atrial fibrillation ([place]) 10/30/2015  . Bladder spasm 06/12/2013  . Carcinoma of prostate ([place]) 10/30/2015  . Cardiomyopathy ([place]) 09/10/2014   Overview:  EF 35%,4/15   . Cerebrovascular accident (CVA) ([place]) 10/30/2015   Overview:  Left embolic XTG,6269   . Difficult or painful urination 11/21/2013  . Dysrhythmia   .  Gout 10/30/2015  . History of kidney stones   . Malignant neoplasm of prostate ([place]) 09/05/2012  . Sinoatrial node dysfunction (HCC) 10/30/2015    Overview:  DDD pacemaker, 1996      Past Surgical History:  Procedure Laterality Date  . castration  10/2009  . CATARACT EXTRACTION W/ INTRAOCULAR LENS  IMPLANT, BILATERAL Bilateral 2016   right eye done then the left one done 3 months later in 2016  . INGUINAL HERNIA REPAIR Left   . INSERT / REPLACE / REMOVE PACEMAKER    . IR FLUORO GUIDE PORT INSERTION RIGHT  07/01/2017  . JOINT REPLACEMENT Bilateral 1980   hips  . left knee replacement  2014  . PACEMAKER INSERTION    . PROSTATECTOMY    . REPLACEMENT TOTAL HIP W/  RESURFACING IMPLANTS  1997   bilateral-   . REPLACEMENT TOTAL KNEE Bilateral   . testes removal     per pt   . TONSILLECTOMY AND ADENOIDECTOMY    . TRANSURETHRAL RESECTION OF BLADDER TUMOR N/A 12/31/2015   Procedure: BLADDER BIOPSY;  Surgeon: Nickie Retort, MD;  Location: [facility] ORS;  Service: Urology;  Laterality: N/A;  . Urinary incontinence valve     AMS    Social History   Socioeconomic History  . Marital status: Married    Spouse name: Not on file  . Number of children: Not on file  . Years of education: Not on file  . Highest education level: Not on file  Occupational History  . Not on file  Social Needs  . Financial resource strain: Not on file  . Food insecurity:    Worry: Not on file    Inability: Not on file  . Transportation needs:    Medical: Not on file    Non-medical: Not on file  Tobacco Use  . Smoking status: Never Smoker  . Smokeless tobacco: Never Used  Substance and Sexual Activity  . Alcohol use: No    Alcohol/week: 0.0 standard drinks  . Drug use: No  . Sexual activity: Never  Lifestyle  . Physical activity:    Days per week: Not on file    Minutes per session: Not on file  . Stress: Not on file  Relationships  . Social connections:    Talks on phone: Not on file    Gets together: Not on file    Attends religious service: Not on file    Active member of club or organization: Not on file    Attends meetings of clubs or  organizations: Not on file    Relationship status: Not on file  . Intimate partner violence:    Fear of current or ex partner: Not on file    Emotionally abused: Not on file    Physically abused: Not on file    Forced sexual activity: Not on file  Other Topics Concern  . Not on file  Social History Narrative  . Not on file    Family History  Problem Relation Age of Onset  . Cancer Mother   . Arthritis Mother   . Heart attack Father   . Arthritis Father   . Transient ischemic attack Brother   . Prostate cancer Neg Hx   . Bladder Cancer Neg Hx      Current Outpatient Medications:  .  Abiraterone Acetate (ZYTIGA PO), Take 4 capsules by mouth daily. , Disp: , Rfl:  .  acetaminophen (TYLENOL) 325 MG tablet, Take 650 mg by mouth 2 (two)  times daily., Disp: , Rfl:  .  allopurinol (ZYLOPRIM) 300 MG tablet, Take by mouth daily. In am., Disp: , Rfl:  .  calcium carbonate (CALCIUM 600) 600 MG TABS tablet, Take 600 mg by mouth 2 (two) times daily with a meal., Disp: , Rfl:  .  hydrochlorothiazide (HYDRODIURIL) 25 MG tablet, Take 25 mg by mouth. In am, Disp: , Rfl:  .  lidocaine-prilocaine (EMLA) cream, , Disp: , Rfl:  .  phentermine 15 MG capsule, Take 15 mg by mouth every morning., Disp: , Rfl:  .  potassium chloride (KLOR-CON M10) 10 MEQ tablet, Take 2 tablets (20 mEq total) by mouth 2 (two) times daily., Disp: 84 tablet, Rfl: 0 .  predniSONE (DELTASONE) 5 MG tablet, TAKE 1 TABLET (5 MG TOTAL) BY MOUTH 2 TIMES DAILY WITH A MEAL., Disp: 60 tablet, Rfl: 5 .  simvastatin (ZOCOR) 40 MG tablet, Take 40 mg by mouth daily at 6 PM. , Disp: , Rfl:  .  warfarin (COUMADIN) 2 MG tablet, Take 2 mg by mouth. , Disp: , Rfl:  .  warfarin (COUMADIN) 5 MG tablet, , Disp: , Rfl:   Physical exam:  Vitals:   04/24/19 1444  BP: (!) 153/88  Pulse: 88  Resp: 18  Temp: 97.9 F (36.6 C)  TempSrc: Tympanic  Weight: 257 lb 14.4 oz (117 kg)  Height: 6' (1.829 m)   Physical Exam HENT:     Head:  Normocephalic and atraumatic.  Eyes:     Pupils: Pupils are equal, round, and reactive to light.  Neck:     Musculoskeletal: Normal range of motion.  Cardiovascular:     Rate and Rhythm: Normal rate and regular rhythm.     Heart sounds: Normal heart sounds.  Pulmonary:     Effort: Pulmonary effort is normal.     Breath sounds: Normal breath sounds.  Abdominal:     General: Bowel sounds are normal.     Palpations: Abdomen is soft.  Skin:    General: Skin is warm and dry.  Neurological:     Mental Status: He is alert and oriented to person, place, and time.      CMP Latest Ref Rng & Units 04/24/2019  Glucose 70 - 99 mg/dL 93  BUN 8 - 23 mg/dL 40(H)  Creatinine 0.61 - 1.24 mg/dL 1.11  Sodium 135 - 145 mmol/L 136  Potassium 3.5 - 5.1 mmol/L 3.2(L)  Chloride 98 - 111 mmol/L 97(L)  CO2 22 - 32 mmol/L 26  Calcium 8.9 - 10.3 mg/dL 10.1  Total Protein 6.5 - 8.1 g/dL 7.4  Total Bilirubin 0.3 - 1.2 mg/dL 1.8(H)  Alkaline Phos 38 - 126 U/L 54  AST 15 - 41 U/L 21  ALT 0 - 44 U/L 12   CBC Latest Ref Rng & Units 04/24/2019  WBC 4.0 - 10.5 K/uL 4.8  Hemoglobin 13.0 - 17.0 g/dL 14.0  Hematocrit 39.0 - 52.0 % 41.3  Platelets 150 - 400 K/uL 130(L)      Assessment and plan- Patient is a 73 y.o. male with history of castrate resistant metastatic prostate cancer with metastases to the lymph nodes and scapula.  He is currently on Zytiga and here for routine follow-up  Patient is currently not on ADT as he has undergone bilateral orchiectomy in the past.  He is tolerating Zytiga well and his PSA is slowly trickling down.  PSA from today is pending and the last value from April 2020 was 6.1.  I will hold  off on getting CT scans until there is a consistent rise in his PSA.  Patient will continue taking his Zytiga at this time.  Patient also has mildly elevated bilirubin which fluctuates between 1.3-1.9 possibly secondary to Zytiga.  Continue to monitor  Patient will get Delton See today and Delton See  again in 1 month  I will see him back in 2 months with CBC with differential, CMP PSA and gets Xgeva on that day   Visit Diagnosis 1. Malignant neoplasm of prostate ([place])   2. High risk medication use   3. Encounter for monitoring denosumab therapy      Dr. Randa Evens, MD, MPH [facility] [phone] 04/24/2019 3:05 PM

## 2019-04-26 ENCOUNTER — Other Ambulatory Visit: Payer: Self-pay | Admitting: Oncology

## 2019-04-26 DIAGNOSIS — C61 Malignant neoplasm of prostate: Secondary | ICD-10-CM

## 2019-05-08 MED FILL — predniSONE 5 MG TABS: 5 | 30 days supply | Qty: 60 | Fill #1

## 2019-05-08 MED FILL — ABIRATERONE ACETATE 250 MG: 250 | 30 days supply | Qty: 120 | Fill #0

## 2019-05-17 DIAGNOSIS — E538 Deficiency of other specified B group vitamins: Secondary | ICD-10-CM | POA: Diagnosis not present

## 2019-05-17 DIAGNOSIS — I4891 Unspecified atrial fibrillation: Secondary | ICD-10-CM | POA: Diagnosis not present

## 2019-05-22 ENCOUNTER — Ambulatory Visit: Payer: PPO

## 2019-05-22 ENCOUNTER — Other Ambulatory Visit: Payer: PPO

## 2019-05-24 ENCOUNTER — Inpatient Hospital Stay: Payer: PPO

## 2019-05-24 ENCOUNTER — Other Ambulatory Visit: Payer: Self-pay

## 2019-05-24 ENCOUNTER — Inpatient Hospital Stay: Payer: PPO | Attending: Oncology

## 2019-05-24 DIAGNOSIS — Z8261 Family history of arthritis: Secondary | ICD-10-CM | POA: Insufficient documentation

## 2019-05-24 DIAGNOSIS — Z809 Family history of malignant neoplasm, unspecified: Secondary | ICD-10-CM | POA: Insufficient documentation

## 2019-05-24 DIAGNOSIS — Z9079 Acquired absence of other genital organ(s): Secondary | ICD-10-CM | POA: Diagnosis not present

## 2019-05-24 DIAGNOSIS — R5383 Other fatigue: Secondary | ICD-10-CM | POA: Insufficient documentation

## 2019-05-24 DIAGNOSIS — M1990 Unspecified osteoarthritis, unspecified site: Secondary | ICD-10-CM | POA: Diagnosis not present

## 2019-05-24 DIAGNOSIS — Z8673 Personal history of transient ischemic attack (TIA), and cerebral infarction without residual deficits: Secondary | ICD-10-CM | POA: Diagnosis not present

## 2019-05-24 DIAGNOSIS — Z8249 Family history of ischemic heart disease and other diseases of the circulatory system: Secondary | ICD-10-CM | POA: Insufficient documentation

## 2019-05-24 DIAGNOSIS — Z79899 Other long term (current) drug therapy: Secondary | ICD-10-CM | POA: Diagnosis not present

## 2019-05-24 DIAGNOSIS — C7951 Secondary malignant neoplasm of bone: Secondary | ICD-10-CM | POA: Insufficient documentation

## 2019-05-24 DIAGNOSIS — E860 Dehydration: Secondary | ICD-10-CM

## 2019-05-24 DIAGNOSIS — C61 Malignant neoplasm of prostate: Secondary | ICD-10-CM | POA: Insufficient documentation

## 2019-05-24 DIAGNOSIS — Z87442 Personal history of urinary calculi: Secondary | ICD-10-CM | POA: Insufficient documentation

## 2019-05-24 DIAGNOSIS — C778 Secondary and unspecified malignant neoplasm of lymph nodes of multiple regions: Secondary | ICD-10-CM | POA: Insufficient documentation

## 2019-05-24 DIAGNOSIS — Z823 Family history of stroke: Secondary | ICD-10-CM | POA: Insufficient documentation

## 2019-05-24 LAB — CBC WITH DIFFERENTIAL/PLATELET
Abs Immature Granulocytes: 0.01 10*3/uL (ref 0.00–0.07)
Basophils Absolute: 0 10*3/uL (ref 0.0–0.1)
Basophils Relative: 0 %
Eosinophils Absolute: 0.1 10*3/uL (ref 0.0–0.5)
Eosinophils Relative: 2 %
HCT: 37.4 % — ABNORMAL LOW (ref 39.0–52.0)
Hemoglobin: 12.5 g/dL — ABNORMAL LOW (ref 13.0–17.0)
Immature Granulocytes: 0 %
Lymphocytes Relative: 18 %
Lymphs Abs: 1 10*3/uL (ref 0.7–4.0)
MCH: 31.2 pg (ref 26.0–34.0)
MCHC: 33.4 g/dL (ref 30.0–36.0)
MCV: 93.3 fL (ref 80.0–100.0)
Monocytes Absolute: 0.4 10*3/uL (ref 0.1–1.0)
Monocytes Relative: 8 %
Neutro Abs: 3.8 10*3/uL (ref 1.7–7.7)
Neutrophils Relative %: 72 %
Platelets: 116 10*3/uL — ABNORMAL LOW (ref 150–400)
RBC: 4.01 MIL/uL — ABNORMAL LOW (ref 4.22–5.81)
RDW: 14.2 % (ref 11.5–15.5)
WBC: 5.4 10*3/uL (ref 4.0–10.5)
nRBC: 0 % (ref 0.0–0.2)

## 2019-05-24 LAB — COMPREHENSIVE METABOLIC PANEL
ALT: 12 U/L (ref 0–44)
AST: 17 U/L (ref 15–41)
Albumin: 4 g/dL (ref 3.5–5.0)
Alkaline Phosphatase: 56 U/L (ref 38–126)
Anion gap: 10 (ref 5–15)
BUN: 38 mg/dL — ABNORMAL HIGH (ref 8–23)
CO2: 26 mmol/L (ref 22–32)
Calcium: 9.7 mg/dL (ref 8.9–10.3)
Chloride: 102 mmol/L (ref 98–111)
Creatinine, Ser: 1.08 mg/dL (ref 0.61–1.24)
GFR calc Af Amer: >60 mL/min (ref >60)
GFR calc non Af Amer: >60 mL/min (ref >60)
Glucose, Bld: 102 mg/dL — ABNORMAL HIGH (ref 70–99)
Potassium: 4.6 mmol/L (ref 3.5–5.1)
Sodium: 138 mmol/L (ref 135–145)
Total Bilirubin: 1.3 mg/dL — ABNORMAL HIGH (ref 0.3–1.2)
Total Protein: 6.5 g/dL (ref 6.5–8.1)

## 2019-05-24 MED ORDER — DENOSUMAB 120 MG/1.7ML ~~LOC~~ SOLN
120.0000 mg | SUBCUTANEOUS | Status: DC
  Administered 2019-05-24: 120 mg via SUBCUTANEOUS

## 2019-05-25 ENCOUNTER — Other Ambulatory Visit: Payer: Self-pay | Admitting: Oncology

## 2019-05-25 NOTE — Telephone Encounter (Signed)
   Ref Range & Units 1d ago 31mo ago 35mo ago  Potassium 3.5 - 5.1 mmol/L 4.6  3.2Low   3.2Low

## 2019-05-30 MED FILL — ABIRATERONE ACETATE 250 MG: 250 | 30 days supply | Qty: 120 | Fill #1

## 2019-05-30 MED FILL — predniSONE 5 MG TABS: 5 | 30 days supply | Qty: 60 | Fill #2

## 2019-06-26 ENCOUNTER — Ambulatory Visit: Payer: PPO | Admitting: Oncology

## 2019-06-26 ENCOUNTER — Other Ambulatory Visit: Payer: PPO

## 2019-06-26 ENCOUNTER — Ambulatory Visit: Payer: PPO

## 2019-07-02 ENCOUNTER — Other Ambulatory Visit: Payer: PPO

## 2019-07-02 ENCOUNTER — Ambulatory Visit: Payer: PPO | Admitting: Oncology

## 2019-07-02 ENCOUNTER — Ambulatory Visit: Payer: PPO

## 2019-07-02 DIAGNOSIS — I4891 Unspecified atrial fibrillation: Secondary | ICD-10-CM | POA: Diagnosis not present

## 2019-07-02 DIAGNOSIS — E538 Deficiency of other specified B group vitamins: Secondary | ICD-10-CM | POA: Diagnosis not present

## 2019-07-03 MED FILL — predniSONE 5 MG TABS: 5 | 30 days supply | Qty: 60 | Fill #3

## 2019-07-04 ENCOUNTER — Other Ambulatory Visit: Payer: Self-pay

## 2019-07-05 ENCOUNTER — Inpatient Hospital Stay (HOSPITAL_BASED_OUTPATIENT_CLINIC_OR_DEPARTMENT_OTHER): Payer: PPO | Admitting: Oncology

## 2019-07-05 ENCOUNTER — Other Ambulatory Visit: Payer: Self-pay

## 2019-07-05 ENCOUNTER — Inpatient Hospital Stay: Payer: PPO | Attending: Oncology

## 2019-07-05 ENCOUNTER — Inpatient Hospital Stay: Payer: PPO

## 2019-07-05 VITALS — BP 130/99 | Temp 97.0°F | Resp 18 | Wt 272.2 lb

## 2019-07-05 DIAGNOSIS — M545 Low back pain: Secondary | ICD-10-CM

## 2019-07-05 DIAGNOSIS — Z87442 Personal history of urinary calculi: Secondary | ICD-10-CM

## 2019-07-05 DIAGNOSIS — Z8261 Family history of arthritis: Secondary | ICD-10-CM | POA: Diagnosis not present

## 2019-07-05 DIAGNOSIS — I4891 Unspecified atrial fibrillation: Secondary | ICD-10-CM | POA: Diagnosis not present

## 2019-07-05 DIAGNOSIS — M7989 Other specified soft tissue disorders: Secondary | ICD-10-CM

## 2019-07-05 DIAGNOSIS — Z8249 Family history of ischemic heart disease and other diseases of the circulatory system: Secondary | ICD-10-CM | POA: Diagnosis not present

## 2019-07-05 DIAGNOSIS — C778 Secondary and unspecified malignant neoplasm of lymph nodes of multiple regions: Secondary | ICD-10-CM | POA: Insufficient documentation

## 2019-07-05 DIAGNOSIS — Z950 Presence of cardiac pacemaker: Secondary | ICD-10-CM

## 2019-07-05 DIAGNOSIS — Z7901 Long term (current) use of anticoagulants: Secondary | ICD-10-CM | POA: Diagnosis not present

## 2019-07-05 DIAGNOSIS — Z8673 Personal history of transient ischemic attack (TIA), and cerebral infarction without residual deficits: Secondary | ICD-10-CM

## 2019-07-05 DIAGNOSIS — G629 Polyneuropathy, unspecified: Secondary | ICD-10-CM | POA: Diagnosis not present

## 2019-07-05 DIAGNOSIS — C61 Malignant neoplasm of prostate: Secondary | ICD-10-CM | POA: Insufficient documentation

## 2019-07-05 DIAGNOSIS — Z809 Family history of malignant neoplasm, unspecified: Secondary | ICD-10-CM

## 2019-07-05 DIAGNOSIS — N39498 Other specified urinary incontinence: Secondary | ICD-10-CM | POA: Insufficient documentation

## 2019-07-05 DIAGNOSIS — R5383 Other fatigue: Secondary | ICD-10-CM | POA: Insufficient documentation

## 2019-07-05 DIAGNOSIS — Z95828 Presence of other vascular implants and grafts: Secondary | ICD-10-CM

## 2019-07-05 DIAGNOSIS — E860 Dehydration: Secondary | ICD-10-CM

## 2019-07-05 DIAGNOSIS — Z79899 Other long term (current) drug therapy: Secondary | ICD-10-CM | POA: Diagnosis not present

## 2019-07-05 DIAGNOSIS — D696 Thrombocytopenia, unspecified: Secondary | ICD-10-CM | POA: Diagnosis not present

## 2019-07-05 DIAGNOSIS — C7951 Secondary malignant neoplasm of bone: Secondary | ICD-10-CM

## 2019-07-05 LAB — CBC WITH DIFFERENTIAL/PLATELET
Abs Immature Granulocytes: 0.01 10*3/uL (ref 0.00–0.07)
Basophils Absolute: 0 10*3/uL (ref 0.0–0.1)
Basophils Relative: 0 %
Eosinophils Absolute: 0.1 10*3/uL (ref 0.0–0.5)
Eosinophils Relative: 2 %
HCT: 36 % — ABNORMAL LOW (ref 39.0–52.0)
Hemoglobin: 12 g/dL — ABNORMAL LOW (ref 13.0–17.0)
Immature Granulocytes: 0 %
Lymphocytes Relative: 19 %
Lymphs Abs: 1 10*3/uL (ref 0.7–4.0)
MCH: 31.2 pg (ref 26.0–34.0)
MCHC: 33.3 g/dL (ref 30.0–36.0)
MCV: 93.5 fL (ref 80.0–100.0)
Monocytes Absolute: 0.4 10*3/uL (ref 0.1–1.0)
Monocytes Relative: 7 %
Neutro Abs: 3.8 10*3/uL (ref 1.7–7.7)
Neutrophils Relative %: 72 %
Platelets: 118 10*3/uL — ABNORMAL LOW (ref 150–400)
RBC: 3.85 MIL/uL — ABNORMAL LOW (ref 4.22–5.81)
RDW: 14.6 % (ref 11.5–15.5)
WBC: 5.3 10*3/uL (ref 4.0–10.5)
nRBC: 0 % (ref 0.0–0.2)

## 2019-07-05 LAB — COMPREHENSIVE METABOLIC PANEL
ALT: 14 U/L (ref 0–44)
AST: 19 U/L (ref 15–41)
Albumin: 4.1 g/dL (ref 3.5–5.0)
Alkaline Phosphatase: 58 U/L (ref 38–126)
Anion gap: 9 (ref 5–15)
BUN: 30 mg/dL — ABNORMAL HIGH (ref 8–23)
CO2: 25 mmol/L (ref 22–32)
Calcium: 9.3 mg/dL (ref 8.9–10.3)
Chloride: 103 mmol/L (ref 98–111)
Creatinine, Ser: 0.99 mg/dL (ref 0.61–1.24)
GFR calc Af Amer: >60 mL/min (ref >60)
GFR calc non Af Amer: >60 mL/min (ref >60)
Glucose, Bld: 126 mg/dL — ABNORMAL HIGH (ref 70–99)
Potassium: 3.7 mmol/L (ref 3.5–5.1)
Sodium: 137 mmol/L (ref 135–145)
Total Bilirubin: 1.1 mg/dL (ref 0.3–1.2)
Total Protein: 6.5 g/dL (ref 6.5–8.1)

## 2019-07-05 LAB — PSA: Prostatic Specific Antigen: 8.18 ng/mL — ABNORMAL HIGH (ref 0.00–4.00)

## 2019-07-05 MED ORDER — DENOSUMAB 120 MG/1.7ML ~~LOC~~ SOLN
120.0000 mg | SUBCUTANEOUS | Status: DC
  Administered 2019-07-05: 11:00:00 120 mg via SUBCUTANEOUS
  Filled 2019-07-05: qty 1.7

## 2019-07-05 MED ORDER — HEPARIN SOD (PORK) LOCK FLUSH 100 UNIT/ML IV SOLN
500.0000 [IU] | Freq: Once | INTRAVENOUS | Status: AC
  Administered 2019-07-05: 500 [IU] via INTRAVENOUS

## 2019-07-05 MED ORDER — SODIUM CHLORIDE 0.9% FLUSH
10.0000 mL | Freq: Once | INTRAVENOUS | Status: AC
  Administered 2019-07-05: 10 mL via INTRAVENOUS
  Filled 2019-07-05: qty 10

## 2019-07-05 NOTE — Progress Notes (Signed)
Patient is here for follow up, he is doing well no major complaints. Swelling in right foot. Back pain

## 2019-07-06 ENCOUNTER — Encounter: Payer: Self-pay | Admitting: Oncology

## 2019-07-06 NOTE — Progress Notes (Signed)
Hematology/Oncology Consult note 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 He had biochemical recurrence in 2017 and was treated with IM RT. He again began to have a rising PSA in 2010 and underwent orchiectomy in 2010.his PSA was being monitored closely PSA a year ago was 0.5, 5 months ago was 3.9 in 2 months ago was 7.8.he underwent CT abdomen and pelvis in May 2018which showed abnormal periaortic and right common iliac lymph nodes. Index aortocaval lymph node 1.4 cm which was new as compared to February 2014 and suspicious for malignancy. Bone scan did not reveal any evidence of bony metastatic disease. He has been referred to [place] for systemic therapy options for CRPC  Patient lives with his wife and is independent of his ADL's. He has A fib for which he is on coumadin. Also has chronic urinary incontinence from prior prostate surgery and has AUS. Reports 50 pound intentional weight loss since October 2017  Repeat PSA in July 2018 was 13.3 indicating PSA doubling time of 3 months  Docetaxel chemo initiated for CRPC with ln mets and rapid doubling PSA in august 2018.Patient completed 5 cycles of docetaxel on 09/27/2017. Cycle #6 was not given due to progressive fatigue and cytopenias as well as worsening neuropathy. Cycle 5 of docetaxel was dose reduced to 60 mg/m square  In April 2019 patient noted to have rising PSA and increase in the size of retroperitoneal LN. Patient  therefore started second line zytiga   Interval history-patient reports swelling in his right lower extremity which has been ongoing for the last week to 10 days.  Also reports mild self-limited pain in his lower back which has not been bothering him.  ECOG PS- 1 Pain scale- 3   Review of systems- Review of Systems  Constitutional: Negative for chills, fever, malaise/fatigue and weight loss.  HENT: Negative for congestion, ear discharge and nosebleeds.   Eyes: Negative for blurred vision.  Respiratory: Negative for cough, hemoptysis, sputum production, shortness of breath and wheezing.   Cardiovascular: Positive for leg swelling (Right leg swelling). Negative for chest pain, palpitations, orthopnea and claudication.  Gastrointestinal: Negative for abdominal pain, blood in stool, constipation, diarrhea, heartburn, melena, nausea and vomiting.  Genitourinary: Negative for dysuria, flank pain, frequency, hematuria and urgency.  Musculoskeletal: Negative for back pain, joint pain and myalgias.  Skin: Negative for rash.  Neurological: Negative for dizziness, tingling, focal weakness, seizures, weakness and headaches.  Endo/Heme/Allergies: Does not bruise/bleed easily.  Psychiatric/Behavioral: Negative for depression and suicidal ideas. The patient does not have insomnia.       Allergies  Allergen Reactions  . Latex Rash    Sensitivity, not allergy.     Past Medical History:  Diagnosis Date  . Arthritis, degenerative 10/30/2015  . Atrial fibrillation ([place]) 10/30/2015  . Bladder spasm 06/12/2013  . Carcinoma of prostate ([place]) 10/30/2015  . Cardiomyopathy ([place]) 09/10/2014   Overview:  EF 35%,4/15   . Cerebrovascular accident (CVA) ([place]) 10/30/2015   Overview:  Left embolic [id number]   . Difficult or  painful urination 11/21/2013  . Dysrhythmia   . Gout 10/30/2015  . History of kidney stones   . Malignant neoplasm of prostate ([place]) 09/05/2012  . Sinoatrial node dysfunction (HCC)  10/30/2015   Overview:  DDD pacemaker, 1996      Past Surgical History:  Procedure Laterality Date  . castration  10/2009  . CATARACT EXTRACTION W/ INTRAOCULAR LENS  IMPLANT, BILATERAL Bilateral 2016   right eye done then the left one done 3 months later in 2016  . INGUINAL HERNIA REPAIR Left   . INSERT / REPLACE / REMOVE PACEMAKER    . IR FLUORO GUIDE PORT INSERTION RIGHT  07/01/2017  . JOINT REPLACEMENT Bilateral 1980   hips  . left knee replacement  2014  . PACEMAKER INSERTION    . PROSTATECTOMY    . REPLACEMENT TOTAL HIP W/  RESURFACING IMPLANTS  1997   bilateral-   . REPLACEMENT TOTAL KNEE Bilateral   . testes removal     per pt   . TONSILLECTOMY AND ADENOIDECTOMY    . TRANSURETHRAL RESECTION OF BLADDER TUMOR N/A 12/31/2015   Procedure: BLADDER BIOPSY;  Surgeon: Nickie Retort, MD;  Location: [facility] ORS;  Service: Urology;  Laterality: N/A;  . Urinary incontinence valve     AMS    Social History   Socioeconomic History  . Marital status: Married    Spouse name: Not on file  . Number of children: Not on file  . Years of education: Not on file  . Highest education level: Not on file  Occupational History  . Not on file  Social Needs  . Financial resource strain: Not on file  . Food insecurity    Worry: Not on file    Inability: Not on file  . Transportation needs    Medical: Not on file    Non-medical: Not on file  Tobacco Use  . Smoking status: Never Smoker  . Smokeless tobacco: Never Used  Substance and Sexual Activity  . Alcohol use: No    Alcohol/week: 0.0 standard drinks  . Drug use: No  . Sexual activity: Never  Lifestyle  . Physical activity    Days per week: Not on file    Minutes per session: Not on file  . Stress: Not on file  Relationships  . Social Herbalist on phone: Not on file    Gets together: Not on file    Attends religious service: Not on file    Active member of club or organization: Not on file    Attends meetings  of clubs or organizations: Not on file    Relationship status: Not on file  . Intimate partner violence    Fear of current or ex partner: Not on file    Emotionally abused: Not on file    Physically abused: Not on file    Forced sexual activity: Not on file  Other Topics Concern  . Not on file  Social History Narrative  . Not on file    Family History  Problem Relation Age of Onset  . Cancer Mother   . Arthritis Mother   . Heart attack Father   . Arthritis Father   . Transient ischemic attack Brother   . Prostate cancer Neg Hx   . Bladder Cancer Neg Hx      Current Outpatient Medications:  .  Abiraterone Acetate (ZYTIGA PO), Take 4 capsules by mouth daily. , Disp: , Rfl:  .  acetaminophen (TYLENOL) 325  MG tablet, Take 650 mg by mouth 2 (two) times daily., Disp: , Rfl:  .  allopurinol (ZYLOPRIM) 300 MG tablet, Take by mouth daily. In am., Disp: , Rfl:  .  bisacodyl (DULCOLAX) 5 MG EC tablet, Take 5 mg by mouth 2 (two) times a day., Disp: , Rfl:  .  calcium carbonate (CALCIUM 600) 600 MG TABS tablet, Take 600 mg by mouth 2 (two) times daily with a meal., Disp: , Rfl:  .  hydrochlorothiazide (HYDRODIURIL) 25 MG tablet, Take 25 mg by mouth. In am, Disp: , Rfl:  .  lidocaine-prilocaine (EMLA) cream, , Disp: , Rfl:  .  phentermine 15 MG capsule, Take 15 mg by mouth every morning., Disp: , Rfl:  .  potassium chloride (K-DUR) 10 MEQ tablet, TAKE 2 TABLETS BY MOUTH ONCE DAILY, Disp: 60 tablet, Rfl: 1 .  predniSONE (DELTASONE) 5 MG tablet, TAKE 1 TABLET (5 MG TOTAL) BY MOUTH 2 TIMES DAILY WITH A MEAL., Disp: 60 tablet, Rfl: 5 .  simvastatin (ZOCOR) 40 MG tablet, Take 40 mg by mouth daily at 6 PM. , Disp: , Rfl:  .  warfarin (COUMADIN) 2 MG tablet, Take 2 mg by mouth. , Disp: , Rfl:  .  warfarin (COUMADIN) 5 MG tablet, , Disp: , Rfl:   Physical exam:  Vitals:   07/05/19 1017  BP: (!) 130/99  Resp: 18  Temp: (!) 97 F (36.1 C)  TempSrc: Tympanic  Weight: 272 lb 3.2 oz (123.5 kg)    Physical Exam Constitutional:      General: He is not in acute distress. HENT:     Head: Normocephalic and atraumatic.  Eyes:     Pupils: Pupils are equal, round, and reactive to light.  Neck:     Musculoskeletal: Normal range of motion.  Cardiovascular:     Rate and Rhythm: Normal rate. Rhythm irregular.     Heart sounds: Normal heart sounds.  Pulmonary:     Effort: Pulmonary effort is normal.     Breath sounds: Normal breath sounds.  Abdominal:     General: Bowel sounds are normal.     Palpations: Abdomen is soft.  Skin:    General: Skin is warm and dry.  Neurological:     Mental Status: He is alert and oriented to person, place, and time.      CMP Latest Ref Rng & Units 07/05/2019  Glucose 70 - 99 mg/dL 126(H)  BUN 8 - 23 mg/dL 30(H)  Creatinine 0.61 - 1.24 mg/dL 0.99  Sodium 135 - 145 mmol/L 137  Potassium 3.5 - 5.1 mmol/L 3.7  Chloride 98 - 111 mmol/L 103  CO2 22 - 32 mmol/L 25  Calcium 8.9 - 10.3 mg/dL 9.3  Total Protein 6.5 - 8.1 g/dL 6.5  Total Bilirubin 0.3 - 1.2 mg/dL 1.1  Alkaline Phos 38 - 126 U/L 58  AST 15 - 41 U/L 19  ALT 0 - 44 U/L 14   CBC Latest Ref Rng & Units 07/05/2019  WBC 4.0 - 10.5 K/uL 5.3  Hemoglobin 13.0 - 17.0 g/dL 12.0(L)  Hematocrit 39.0 - 52.0 % 36.0(L)  Platelets 150 - 400 K/uL 118(L)      Assessment and plan- Patient is a 73 y.o. male with history of castrate resistant metastatic prostate cancer with metastases to the lymph nodes and scapula.  He is currently on Zytiga and this is a routine follow-up visit for prostate cancer   Patient's PSA has slowly been rising from 6.1-8.1 over the last  3 months.  Overall patient has low-volume disease which has remained stable on Zytiga.  He will get Xgeva today and Xgeva next month and I will see him back in 2 months time with CBC with differential, CMP and PSA.  Due to the persistent rise in his PSA I will be considering repeat scans including CT chest abdomen and pelvis with contrast and a  bone scan at that time.  I will obtain right lower extremity Doppler to rule out DVT given right lower extremity swelling  Patient has mild baseline thrombocytopenia which is chronic and we will continue to monitor.  Hemoglobin is stable around 12-13 which is his baseline    Visit Diagnosis 1. Malignant neoplasm of prostate ([place])   2. Swelling of lower leg   3. High risk medication use      Dr. Randa Evens, MD, MPH [facility] at [facility] [phone] 07/06/2019 9:39 AM

## 2019-07-11 MED FILL — ABIRATERONE ACETATE 250 MG: 250 | 30 days supply | Qty: 120 | Fill #2

## 2019-07-17 DIAGNOSIS — I4891 Unspecified atrial fibrillation: Secondary | ICD-10-CM | POA: Diagnosis not present

## 2019-08-02 MED FILL — predniSONE 5 MG TABS: 5 | 30 days supply | Qty: 60 | Fill #4

## 2019-08-06 ENCOUNTER — Other Ambulatory Visit: Payer: Self-pay

## 2019-08-06 ENCOUNTER — Inpatient Hospital Stay: Payer: PPO

## 2019-08-06 ENCOUNTER — Inpatient Hospital Stay: Payer: PPO | Attending: Oncology

## 2019-08-06 DIAGNOSIS — C7951 Secondary malignant neoplasm of bone: Secondary | ICD-10-CM | POA: Diagnosis not present

## 2019-08-06 DIAGNOSIS — E860 Dehydration: Secondary | ICD-10-CM

## 2019-08-06 DIAGNOSIS — C61 Malignant neoplasm of prostate: Secondary | ICD-10-CM | POA: Insufficient documentation

## 2019-08-06 LAB — COMPREHENSIVE METABOLIC PANEL
ALT: 13 U/L (ref 0–44)
AST: 16 U/L (ref 15–41)
Albumin: 4.4 g/dL (ref 3.5–5.0)
Alkaline Phosphatase: 52 U/L (ref 38–126)
Anion gap: 14 (ref 5–15)
BUN: 33 mg/dL — ABNORMAL HIGH (ref 8–23)
CO2: 24 mmol/L (ref 22–32)
Calcium: 9.8 mg/dL (ref 8.9–10.3)
Chloride: 99 mmol/L (ref 98–111)
Creatinine, Ser: 0.98 mg/dL (ref 0.61–1.24)
GFR calc Af Amer: >60 mL/min (ref >60)
GFR calc non Af Amer: >60 mL/min (ref >60)
Glucose, Bld: 101 mg/dL — ABNORMAL HIGH (ref 70–99)
Potassium: 3.4 mmol/L — ABNORMAL LOW (ref 3.5–5.1)
Sodium: 137 mmol/L (ref 135–145)
Total Bilirubin: 1.3 mg/dL — ABNORMAL HIGH (ref 0.3–1.2)
Total Protein: 7.2 g/dL (ref 6.5–8.1)

## 2019-08-06 MED ORDER — DENOSUMAB 120 MG/1.7ML ~~LOC~~ SOLN
120.0000 mg | SUBCUTANEOUS | Status: DC
  Administered 2019-08-06: 120 mg via SUBCUTANEOUS
  Filled 2019-08-06: qty 1.7

## 2019-08-13 MED FILL — ABIRATERONE ACETATE 250 MG: 250 | 30 days supply | Qty: 120 | Fill #3

## 2019-08-16 DIAGNOSIS — I4891 Unspecified atrial fibrillation: Secondary | ICD-10-CM | POA: Diagnosis not present

## 2019-09-03 MED FILL — predniSONE 5 MG TABS: 5 | 30 days supply | Qty: 60 | Fill #5

## 2019-09-06 NOTE — Progress Notes (Signed)
Patient is coming in for follow up he is doing well no complaints  

## 2019-09-07 ENCOUNTER — Inpatient Hospital Stay: Payer: PPO

## 2019-09-07 ENCOUNTER — Other Ambulatory Visit: Payer: Self-pay

## 2019-09-07 ENCOUNTER — Encounter: Payer: Self-pay | Admitting: Oncology

## 2019-09-07 ENCOUNTER — Inpatient Hospital Stay: Payer: PPO | Attending: Oncology | Admitting: Oncology

## 2019-09-07 ENCOUNTER — Other Ambulatory Visit: Payer: Self-pay | Admitting: Oncology

## 2019-09-07 VITALS — BP 136/72 | HR 72 | Temp 96.4°F | Resp 16 | Wt 268.1 lb

## 2019-09-07 DIAGNOSIS — Z79899 Other long term (current) drug therapy: Secondary | ICD-10-CM | POA: Insufficient documentation

## 2019-09-07 DIAGNOSIS — Z8249 Family history of ischemic heart disease and other diseases of the circulatory system: Secondary | ICD-10-CM | POA: Insufficient documentation

## 2019-09-07 DIAGNOSIS — R17 Unspecified jaundice: Secondary | ICD-10-CM | POA: Insufficient documentation

## 2019-09-07 DIAGNOSIS — R5383 Other fatigue: Secondary | ICD-10-CM | POA: Insufficient documentation

## 2019-09-07 DIAGNOSIS — N39498 Other specified urinary incontinence: Secondary | ICD-10-CM | POA: Diagnosis not present

## 2019-09-07 DIAGNOSIS — C778 Secondary and unspecified malignant neoplasm of lymph nodes of multiple regions: Secondary | ICD-10-CM | POA: Diagnosis not present

## 2019-09-07 DIAGNOSIS — R12 Heartburn: Secondary | ICD-10-CM | POA: Insufficient documentation

## 2019-09-07 DIAGNOSIS — Z7983 Long term (current) use of bisphosphonates: Secondary | ICD-10-CM | POA: Diagnosis not present

## 2019-09-07 DIAGNOSIS — C61 Malignant neoplasm of prostate: Secondary | ICD-10-CM | POA: Diagnosis not present

## 2019-09-07 DIAGNOSIS — Z7901 Long term (current) use of anticoagulants: Secondary | ICD-10-CM | POA: Diagnosis not present

## 2019-09-07 DIAGNOSIS — M109 Gout, unspecified: Secondary | ICD-10-CM | POA: Diagnosis not present

## 2019-09-07 DIAGNOSIS — I4891 Unspecified atrial fibrillation: Secondary | ICD-10-CM | POA: Diagnosis not present

## 2019-09-07 DIAGNOSIS — C7951 Secondary malignant neoplasm of bone: Secondary | ICD-10-CM | POA: Insufficient documentation

## 2019-09-07 DIAGNOSIS — E860 Dehydration: Secondary | ICD-10-CM

## 2019-09-07 LAB — COMPREHENSIVE METABOLIC PANEL
ALT: 12 U/L (ref 0–44)
AST: 16 U/L (ref 15–41)
Albumin: 4.1 g/dL (ref 3.5–5.0)
Alkaline Phosphatase: 55 U/L (ref 38–126)
Anion gap: 9 (ref 5–15)
BUN: 36 mg/dL — ABNORMAL HIGH (ref 8–23)
CO2: 26 mmol/L (ref 22–32)
Calcium: 10 mg/dL (ref 8.9–10.3)
Chloride: 101 mmol/L (ref 98–111)
Creatinine, Ser: 1.26 mg/dL — ABNORMAL HIGH (ref 0.61–1.24)
GFR calc Af Amer: >60 mL/min (ref >60)
GFR calc non Af Amer: 57 mL/min — ABNORMAL LOW (ref >60)
Glucose, Bld: 118 mg/dL — ABNORMAL HIGH (ref 70–99)
Potassium: 3.7 mmol/L (ref 3.5–5.1)
Sodium: 136 mmol/L (ref 135–145)
Total Bilirubin: 1.6 mg/dL — ABNORMAL HIGH (ref 0.3–1.2)
Total Protein: 6.8 g/dL (ref 6.5–8.1)

## 2019-09-07 LAB — CBC WITH DIFFERENTIAL/PLATELET
Abs Immature Granulocytes: 0.02 10*3/uL (ref 0.00–0.07)
Basophils Absolute: 0 10*3/uL (ref 0.0–0.1)
Basophils Relative: 0 %
Eosinophils Absolute: 0 10*3/uL (ref 0.0–0.5)
Eosinophils Relative: 0 %
HCT: 38.1 % — ABNORMAL LOW (ref 39.0–52.0)
Hemoglobin: 13.1 g/dL (ref 13.0–17.0)
Immature Granulocytes: 0 %
Lymphocytes Relative: 17 %
Lymphs Abs: 1.2 10*3/uL (ref 0.7–4.0)
MCH: 31.7 pg (ref 26.0–34.0)
MCHC: 34.4 g/dL (ref 30.0–36.0)
MCV: 92.3 fL (ref 80.0–100.0)
Monocytes Absolute: 0.4 10*3/uL (ref 0.1–1.0)
Monocytes Relative: 6 %
Neutro Abs: 5.4 10*3/uL (ref 1.7–7.7)
Neutrophils Relative %: 77 %
Platelets: 128 10*3/uL — ABNORMAL LOW (ref 150–400)
RBC: 4.13 MIL/uL — ABNORMAL LOW (ref 4.22–5.81)
RDW: 14.2 % (ref 11.5–15.5)
WBC: 7 10*3/uL (ref 4.0–10.5)
nRBC: 0 % (ref 0.0–0.2)

## 2019-09-07 LAB — PSA: Prostatic Specific Antigen: 10.05 ng/mL — ABNORMAL HIGH (ref 0.00–4.00)

## 2019-09-07 MED ORDER — HEPARIN SOD (PORK) LOCK FLUSH 100 UNIT/ML IV SOLN
500.0000 [IU] | Freq: Once | INTRAVENOUS | Status: AC
  Administered 2019-09-07: 500 [IU] via INTRAVENOUS

## 2019-09-07 MED ORDER — DENOSUMAB 120 MG/1.7ML ~~LOC~~ SOLN
120.0000 mg | SUBCUTANEOUS | Status: AC
  Administered 2019-09-07: 12:00:00 120 mg via SUBCUTANEOUS
  Filled 2019-09-07: qty 1.7

## 2019-09-07 MED ORDER — HEPARIN SOD (PORK) LOCK FLUSH 100 UNIT/ML IV SOLN
INTRAVENOUS | Status: AC
  Filled 2019-09-07: qty 5

## 2019-09-07 MED ORDER — SODIUM CHLORIDE 0.9% FLUSH
10.0000 mL | Freq: Once | INTRAVENOUS | Status: AC
  Administered 2019-09-07: 10:00:00 10 mL via INTRAVENOUS
  Filled 2019-09-07: qty 10

## 2019-09-07 NOTE — Progress Notes (Signed)
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 He had biochemical recurrence in 2017 and was treated with IM RT. He again began to have a rising PSA in 2010 and underwent orchiectomy in 2010.his PSA was being monitored closely PSA a year ago was 0.5, 5 months ago was 3.9 in 2 months ago was 7.8.he underwent CT abdomen and pelvis in May 2018which showed abnormal periaortic and right common iliac lymph nodes. Index aortocaval lymph node 1.4 cm which was new as compared to February 2014 and suspicious for malignancy. Bone scan did not reveal any evidence of bony metastatic disease. He has been referred to [place] for systemic therapy options for CRPC  Patient lives with his wife and is independent of his ADL's. He has A fib for which he is on coumadin. Also has chronic urinary incontinence from prior prostate surgery and has AUS. Reports 50 pound intentional weight loss since October 2017  Repeat PSA in July 2018 was 13.3 indicating PSA doubling time of 3 months  Docetaxel chemo initiated for CRPC with ln mets and rapid doubling PSA in august 2018.Patient completed 5 cycles of docetaxel on 09/27/2017. Cycle #6 was not given due to progressive fatigue and cytopenias as well as worsening neuropathy. Cycle 5 of docetaxel was dose reduced to 60 mg/m square  In April 2019 patient noted to have rising PSA and increase in the size of retroperitoneal LN. Patient  therefore started second line zytiga) will  Interval history-he reports occasional heartburn which resolves after eating.  Denies other complaints at this time  ECOG PS- 1 Pain scale- 0   Review of systems- Review of Systems  Constitutional: Positive for malaise/fatigue. Negative for chills, fever and weight loss.  HENT: Negative for congestion, ear discharge and nosebleeds.   Eyes: Negative for blurred vision.  Respiratory: Negative for cough, hemoptysis, sputum production, shortness of breath and wheezing.   Cardiovascular: Negative for chest pain, palpitations, orthopnea and claudication.  Gastrointestinal: Negative for abdominal pain, blood in stool, constipation, diarrhea, heartburn, melena, nausea and vomiting.  Genitourinary: Negative for dysuria, flank pain, frequency, hematuria and urgency.  Musculoskeletal: Negative for back pain, joint pain and myalgias.  Skin: Negative for rash.  Neurological: Negative for dizziness, tingling, focal weakness, seizures, weakness and headaches.  Endo/Heme/Allergies: Does not bruise/bleed easily.  Psychiatric/Behavioral: Negative for depression and suicidal ideas. The patient does not have insomnia.        Allergies  Allergen Reactions  . Latex Rash    Sensitivity, not allergy.     Past Medical History:  Diagnosis Date  . Arthritis, degenerative 10/30/2015  . Atrial fibrillation ([place]) 10/30/2015  . Bladder spasm 06/12/2013  . Carcinoma of prostate ([place]) 10/30/2015  . Cardiomyopathy ([place]) 09/10/2014   Overview:  EF 35%,4/15   . Cerebrovascular accident (CVA) ([place]) 10/30/2015   Overview:  Left embolic [id number]   . Difficult or painful urination 11/21/2013  . Dysrhythmia   . Gout 10/30/2015  . History of kidney stones   . Malignant neoplasm of prostate (  [place]) 09/05/2012  . Sinoatrial node dysfunction (HCC) 10/30/2015   Overview:  DDD pacemaker, 1996      Past Surgical History:  Procedure Laterality Date  . castration  10/2009   . CATARACT EXTRACTION W/ INTRAOCULAR LENS  IMPLANT, BILATERAL Bilateral 2016   right eye done then the left one done 3 months later in 2016  . INGUINAL HERNIA REPAIR Left   . INSERT / REPLACE / REMOVE PACEMAKER    . IR FLUORO GUIDE PORT INSERTION RIGHT  07/01/2017  . JOINT REPLACEMENT Bilateral 1980   hips  . left knee replacement  2014  . PACEMAKER INSERTION    . PROSTATECTOMY    . REPLACEMENT TOTAL HIP W/  RESURFACING IMPLANTS  1997   bilateral-   . REPLACEMENT TOTAL KNEE Bilateral   . testes removal     per pt   . TONSILLECTOMY AND ADENOIDECTOMY    . TRANSURETHRAL RESECTION OF BLADDER TUMOR N/A 12/31/2015   Procedure: BLADDER BIOPSY;  Surgeon: Nickie Retort, MD;  Location: [facility] ORS;  Service: Urology;  Laterality: N/A;  . Urinary incontinence valve     AMS    Social History   Socioeconomic History  . Marital status: Married    Spouse name: Not on file  . Number of children: Not on file  . Years of education: Not on file  . Highest education level: Not on file  Occupational History  . Not on file  Social Needs  . Financial resource strain: Not on file  . Food insecurity    Worry: Not on file    Inability: Not on file  . Transportation needs    Medical: Not on file    Non-medical: Not on file  Tobacco Use  . Smoking status: Never Smoker  . Smokeless tobacco: Never Used  Substance and Sexual Activity  . Alcohol use: No    Alcohol/week: 0.0 standard drinks  . Drug use: No  . Sexual activity: Never  Lifestyle  . Physical activity    Days per week: Not on file    Minutes per session: Not on file  . Stress: Not on file  Relationships  . Social Herbalist on phone: Not on file    Gets together: Not on file    Attends religious service: Not on file    Active member of club or organization: Not on file    Attends meetings of clubs or organizations: Not on file    Relationship status: Not on file  . Intimate partner violence    Fear of current or ex  partner: Not on file    Emotionally abused: Not on file    Physically abused: Not on file    Forced sexual activity: Not on file  Other Topics Concern  . Not on file  Social History Narrative  . Not on file    Family History  Problem Relation Age of Onset  . Cancer Mother   . Arthritis Mother   . Heart attack Father   . Arthritis Father   . Transient ischemic attack Brother   . Prostate cancer Neg Hx   . Bladder Cancer Neg Hx      Current Outpatient Medications:  .  Abiraterone Acetate (ZYTIGA PO), Take 4 capsules by mouth daily. , Disp: , Rfl:  .  acetaminophen (TYLENOL) 325 MG tablet, Take 650 mg by mouth 2 (two) times daily., Disp: , Rfl:  .  allopurinol (ZYLOPRIM) 300 MG tablet, Take by  mouth daily. In am., Disp: , Rfl:  .  bisacodyl (DULCOLAX) 5 MG EC tablet, Take 5 mg by mouth 2 (two) times a day., Disp: , Rfl:  .  calcium carbonate (CALCIUM 600) 600 MG TABS tablet, Take 600 mg by mouth 2 (two) times daily with a meal., Disp: , Rfl:  .  hydrochlorothiazide (HYDRODIURIL) 25 MG tablet, Take 25 mg by mouth. In am, Disp: , Rfl:  .  lidocaine-prilocaine (EMLA) cream, , Disp: , Rfl:  .  phentermine 15 MG capsule, Take 15 mg by mouth every morning., Disp: , Rfl:  .  potassium chloride (K-DUR) 10 MEQ tablet, TAKE 2 TABLETS BY MOUTH ONCE DAILY, Disp: 60 tablet, Rfl: 1 .  predniSONE (DELTASONE) 5 MG tablet, TAKE 1 TABLET (5 MG TOTAL) BY MOUTH 2 TIMES DAILY WITH A MEAL., Disp: 60 tablet, Rfl: 5 .  simvastatin (ZOCOR) 40 MG tablet, Take 40 mg by mouth daily at 6 PM. , Disp: , Rfl:  .  warfarin (COUMADIN) 2 MG tablet, Take 2 mg by mouth. , Disp: , Rfl:  .  warfarin (COUMADIN) 5 MG tablet, , Disp: , Rfl:  No current facility-administered medications for this visit.   Facility-Administered Medications Ordered in Other Visits:  .  denosumab (XGEVA) injection 120 mg, 120 mg, Subcutaneous, Q28 days, Sindy Guadeloupe, MD, 120 mg at 09/07/19 1144  Physical exam:  Vitals:   09/07/19 1143  09/07/19 1144  BP: 136/72   Pulse: 72   Resp: 16   Temp:  (!) 96.4 F (35.8 C)  TempSrc: Tympanic Tympanic  Weight: 268 lb 1.6 oz (121.6 kg)    Physical Exam HENT:     Head: Normocephalic and atraumatic.  Eyes:     Pupils: Pupils are equal, round, and reactive to light.  Neck:     Musculoskeletal: Normal range of motion.  Cardiovascular:     Rate and Rhythm: Normal rate and regular rhythm.     Heart sounds: Normal heart sounds.  Pulmonary:     Effort: Pulmonary effort is normal.     Breath sounds: Normal breath sounds.  Abdominal:     General: Bowel sounds are normal.     Palpations: Abdomen is soft.  Skin:    General: Skin is warm and dry.  Neurological:     Mental Status: He is alert and oriented to person, place, and time.      CMP Latest Ref Rng & Units 09/07/2019  Glucose 70 - 99 mg/dL 118(H)  BUN 8 - 23 mg/dL 36(H)  Creatinine 0.61 - 1.24 mg/dL 1.26(H)  Sodium 135 - 145 mmol/L 136  Potassium 3.5 - 5.1 mmol/L 3.7  Chloride 98 - 111 mmol/L 101  CO2 22 - 32 mmol/L 26  Calcium 8.9 - 10.3 mg/dL 10.0  Total Protein 6.5 - 8.1 g/dL 6.8  Total Bilirubin 0.3 - 1.2 mg/dL 1.6(H)  Alkaline Phos 38 - 126 U/L 55  AST 15 - 41 U/L 16  ALT 0 - 44 U/L 12   CBC Latest Ref Rng & Units 09/07/2019  WBC 4.0 - 10.5 K/uL 7.0  Hemoglobin 13.0 - 17.0 g/dL 13.1  Hematocrit 39.0 - 52.0 % 38.1(L)  Platelets 150 - 400 K/uL 128(L)    Assessment and plan- Patient is a 73 y.o. male with history of castrate resistant metastatic prostate cancer with metastases to the lymph nodes and scapula.  He is currently on Zytiga and this is a routine follow-up visit for prostate cancer  Patient's PSA has  been slowly trickling from 6-8 over the last 3 to 4 months.  PSA from today is pending.  There is a further rise in his PSA I will be doing repeat CT chest abdomen pelvis with contrast and a bone scan prior to his next visit.  Patient has known metastases to the scapula and is therefore receiving Xgeva  on a monthly basis.  He will receive Xgeva today, and 1 month and 2 months and I will see him back in 2 months with CBC with differential, CMP and PSA.  Patient is s/p bilateral orchiectomy and therefore not receiving medical ADT.  Elevated bilirubin: His total bilirubin has been fluctuating between 1.3-1.9 and could be secondary to Zytiga.  Continue to monitor.  AST and ALT have remained normal   Visit Diagnosis 1. Malignant neoplasm of prostate ([place] [address])   2. Long term (current) use of bisphosphonates   3. High risk medication use   4. Elevated bilirubin      Dr. Randa Evens, MD, MPH [facility] at [facility] [phone] 09/07/2019 1:13 PM

## 2019-09-07 NOTE — Telephone Encounter (Signed)
CBC with Differential Order: [id number] Status:  Final result Visible to patient:  No (not released) Next appt:  10/08/2019 at 11:15 AM in Oncology ([facility]) Dx:  Malignant neoplasm of prostate (HCC)  Ref Range & Units 10:17 55mo ago 65mo ago  WBC 4.0 - 10.5 K/uL 7.0  5.3  5.4   RBC 4.22 - 5.81 MIL/uL 4.13Low   3.85Low   4.01Low    Hemoglobin 13.0 - 17.0 g/dL 13.1  12.0Low   12.5Low    HCT 39.0 - 52.0 % 38.1Low   36.0Low   37.4Low    MCV 80.0 - 100.0 fL 92.3  93.5  93.3   MCH 26.0 - 34.0 pg 31.7  31.2  31.2   MCHC 30.0 - 36.0 g/dL 34.4  33.3  33.4   RDW 11.5 - 15.5 % 14.2  14.6  14.2   Platelets 150 - 400 K/uL 128Low   118Low   116Low    nRBC 0.0 - 0.2 % 0.0  0.0  0.0   Neutrophils Relative % % 77  72  72   Neutro Abs 1.7 - 7.7 K/uL 5.4  3.8  3.8   Lymphocytes Relative % 17  19  18    Lymphs Abs 0.7 - 4.0 K/uL 1.2  1.0  1.0   Monocytes Relative % 6  7  8    Monocytes Absolute 0.1 - 1.0 K/uL 0.4  0.4  0.4   Eosinophils Relative % 0  2  2   Eosinophils Absolute 0.0 - 0.5 K/uL 0.0  0.1  0.1   Basophils Relative % 0  0  0   Basophils Absolute 0.0 - 0.1 K/uL 0.0  0.0  0.0   Immature Granulocytes % 0  0  0   Abs Immature Granulocytes 0.00 - 0.07 K/uL 0.02  0.01 CM  0.01 CM   Comment: Performed at [facility], [address] [place], [place] [address]  Resulting Agency  [facility]      Specimen Collected: 09/07/19 10:17 Last Resulted: 09/07/19 10:32     Lab Flowsheet   Order Details   View Encounter   Lab and Collection Details   Routing   Result History     CM=Additional comments      Other Results from 09/07/2019  Contains abnormal data PSA Order: [id number]  Status:  Final result Visible to patient:  No (not released) Next appt:  10/08/2019 at 11:15 AM in Oncology ([facility]) Dx:  Malignant neoplasm of prostate (HCC)  Ref Range & Units 10:17 66mo ago 25mo ago  Prostatic Specific Antigen 0.00 - 4.00 ng/mL 10.05High   8.18High  CM   7.18High  CM   Comment: (NOTE)  While PSA levels of <=4.0 ng/ml are reported as reference range, some  men with levels below 4.0 ng/ml can have prostate cancer and many men  with PSA above 4.0 ng/ml do not have prostate cancer. Other tests  such as free PSA, age specific reference ranges, PSA velocity and PSA  doubling time may be helpful especially in men less than 48 years  old.  Performed at [facility], [place] [address], [place], [place]  [address]   Resulting Agency  [facility] LAB [facility]      Specimen Collected: 09/07/19 10:17 Last Resulted: 09/07/19 14:36     Lab Flowsheet   Order Details   View Encounter   Lab and Collection Details  Routing   Result History     CM=Additional comments        Contains abnormal data Comprehensive metabolic panel Order: [id number]  Status:  Final result Visible to patient:  No (not released) Next appt:  10/08/2019 at 11:15 AM in Oncology ([facility]) Dx:  Malignant neoplasm of prostate (HCC)  Ref Range & Units 10:17 39mo ago 28mo ago  Sodium 135 - 145 mmol/L 136  137  137   Potassium 3.5 - 5.1 mmol/L 3.7  3.4Low   3.7   Chloride 98 - 111 mmol/L 101  99  103   CO2 22 - 32 mmol/L 26  24  25    Glucose, Bld 70 - 99 mg/dL 118High   101High   126High    BUN 8 - 23 mg/dL 36High   33High   30High    Creatinine, Ser 0.61 - 1.24 mg/dL 1.26High   0.98  0.99   Calcium 8.9 - 10.3 mg/dL 10.0  9.8  9.3   Total Protein 6.5 - 8.1 g/dL 6.8  7.2  6.5   Albumin 3.5 - 5.0 g/dL 4.1  4.4  4.1   AST 15 - 41 U/L 16  16  19    ALT 0 - 44 U/L 12  13  14    Alkaline Phosphatase 38 - 126 U/L 55  52  58   Total Bilirubin 0.3 - 1.2 mg/dL 1.6High   1.3High   1.1   GFR calc non Af Amer >60 mL/min 57Low   >60  >60   GFR calc Af Amer >60 mL/min >60  >60  >60   Anion gap 5 - 15 9  14  CM  9 CM   Comment: Performed at [facility], [address] [place], [place] [address]  Resulting Agency  [facility] CL[facility] LAB [facility] [place] [facility]       Specimen Collected: 09/07/19 10:17 Last Resulted: 09/07/19 10:46

## 2019-09-13 MED FILL — ABIRATERONE ACETATE 250 MG: 250 | 30 days supply | Qty: 120 | Fill #0

## 2019-09-18 DIAGNOSIS — Z125 Encounter for screening for malignant neoplasm of prostate: Secondary | ICD-10-CM | POA: Diagnosis not present

## 2019-09-18 DIAGNOSIS — I482 Chronic atrial fibrillation, unspecified: Secondary | ICD-10-CM | POA: Diagnosis not present

## 2019-09-18 DIAGNOSIS — M1A00X0 Idiopathic chronic gout, unspecified site, without tophus (tophi): Secondary | ICD-10-CM | POA: Diagnosis not present

## 2019-09-18 DIAGNOSIS — E782 Mixed hyperlipidemia: Secondary | ICD-10-CM | POA: Diagnosis not present

## 2019-09-18 DIAGNOSIS — E538 Deficiency of other specified B group vitamins: Secondary | ICD-10-CM | POA: Diagnosis not present

## 2019-09-20 ENCOUNTER — Encounter: Payer: Self-pay | Admitting: Pharmacy Technician

## 2019-09-20 NOTE — Progress Notes (Signed)
Patient has [employer] co pay assistance effective dates 09/15/18-12/14/19 ID [id number] for $[id number].

## 2019-09-25 ENCOUNTER — Other Ambulatory Visit: Payer: Self-pay | Admitting: Oncology

## 2019-09-25 DIAGNOSIS — M1A00X0 Idiopathic chronic gout, unspecified site, without tophus (tophi): Secondary | ICD-10-CM | POA: Diagnosis not present

## 2019-09-25 DIAGNOSIS — E782 Mixed hyperlipidemia: Secondary | ICD-10-CM | POA: Diagnosis not present

## 2019-09-25 DIAGNOSIS — E538 Deficiency of other specified B group vitamins: Secondary | ICD-10-CM | POA: Diagnosis not present

## 2019-09-25 DIAGNOSIS — D696 Thrombocytopenia, unspecified: Secondary | ICD-10-CM | POA: Diagnosis not present

## 2019-09-25 DIAGNOSIS — I420 Dilated cardiomyopathy: Secondary | ICD-10-CM | POA: Diagnosis not present

## 2019-09-25 DIAGNOSIS — I482 Chronic atrial fibrillation, unspecified: Secondary | ICD-10-CM | POA: Diagnosis not present

## 2019-09-25 DIAGNOSIS — Z0000 Encounter for general adult medical examination without abnormal findings: Secondary | ICD-10-CM | POA: Diagnosis not present

## 2019-10-02 ENCOUNTER — Other Ambulatory Visit: Payer: Self-pay | Admitting: Oncology

## 2019-10-02 DIAGNOSIS — C61 Malignant neoplasm of prostate: Secondary | ICD-10-CM

## 2019-10-02 MED FILL — predniSONE 5 MG TABS: 5 | 30 days supply | Qty: 60 | Fill #0

## 2019-10-08 ENCOUNTER — Other Ambulatory Visit: Payer: Self-pay

## 2019-10-08 ENCOUNTER — Inpatient Hospital Stay: Payer: PPO | Attending: Oncology

## 2019-10-08 ENCOUNTER — Other Ambulatory Visit: Payer: Self-pay | Admitting: *Deleted

## 2019-10-08 ENCOUNTER — Inpatient Hospital Stay: Payer: PPO

## 2019-10-08 DIAGNOSIS — C61 Malignant neoplasm of prostate: Secondary | ICD-10-CM | POA: Diagnosis not present

## 2019-10-08 DIAGNOSIS — C7951 Secondary malignant neoplasm of bone: Secondary | ICD-10-CM | POA: Diagnosis not present

## 2019-10-08 DIAGNOSIS — E860 Dehydration: Secondary | ICD-10-CM

## 2019-10-08 DIAGNOSIS — Z79899 Other long term (current) drug therapy: Secondary | ICD-10-CM

## 2019-10-08 LAB — COMPREHENSIVE METABOLIC PANEL
ALT: 11 U/L (ref 0–44)
AST: 16 U/L (ref 15–41)
Albumin: 4.1 g/dL (ref 3.5–5.0)
Alkaline Phosphatase: 57 U/L (ref 38–126)
Anion gap: 8 (ref 5–15)
BUN: 39 mg/dL — ABNORMAL HIGH (ref 8–23)
CO2: 27 mmol/L (ref 22–32)
Calcium: 9.8 mg/dL (ref 8.9–10.3)
Chloride: 104 mmol/L (ref 98–111)
Creatinine, Ser: 1.03 mg/dL (ref 0.61–1.24)
GFR calc Af Amer: >60 mL/min (ref >60)
GFR calc non Af Amer: >60 mL/min (ref >60)
Glucose, Bld: 98 mg/dL (ref 70–99)
Potassium: 4.3 mmol/L (ref 3.5–5.1)
Sodium: 139 mmol/L (ref 135–145)
Total Bilirubin: 1.1 mg/dL (ref 0.3–1.2)
Total Protein: 6.5 g/dL (ref 6.5–8.1)

## 2019-10-08 LAB — PSA: Prostatic Specific Antigen: 10.14 ng/mL — ABNORMAL HIGH (ref 0.00–4.00)

## 2019-10-08 LAB — CBC WITH DIFFERENTIAL/PLATELET
Abs Immature Granulocytes: 0.02 10*3/uL (ref 0.00–0.07)
Basophils Absolute: 0 10*3/uL (ref 0.0–0.1)
Basophils Relative: 0 %
Eosinophils Absolute: 0.1 10*3/uL (ref 0.0–0.5)
Eosinophils Relative: 2 %
HCT: 37.3 % — ABNORMAL LOW (ref 39.0–52.0)
Hemoglobin: 12.3 g/dL — ABNORMAL LOW (ref 13.0–17.0)
Immature Granulocytes: 0 %
Lymphocytes Relative: 19 %
Lymphs Abs: 1 10*3/uL (ref 0.7–4.0)
MCH: 31.1 pg (ref 26.0–34.0)
MCHC: 33 g/dL (ref 30.0–36.0)
MCV: 94.2 fL (ref 80.0–100.0)
Monocytes Absolute: 0.4 10*3/uL (ref 0.1–1.0)
Monocytes Relative: 8 %
Neutro Abs: 3.8 10*3/uL (ref 1.7–7.7)
Neutrophils Relative %: 71 %
Platelets: 123 10*3/uL — ABNORMAL LOW (ref 150–400)
RBC: 3.96 MIL/uL — ABNORMAL LOW (ref 4.22–5.81)
RDW: 14.4 % (ref 11.5–15.5)
WBC: 5.3 10*3/uL (ref 4.0–10.5)
nRBC: 0 % (ref 0.0–0.2)

## 2019-10-08 MED ORDER — DENOSUMAB 120 MG/1.7ML ~~LOC~~ SOLN
120.0000 mg | SUBCUTANEOUS | Status: DC
  Administered 2019-10-08: 120 mg via SUBCUTANEOUS

## 2019-10-12 MED FILL — ABIRATERONE ACETATE 250 MG: 250 | 30 days supply | Qty: 120 | Fill #1

## 2019-10-15 ENCOUNTER — Telehealth: Payer: Self-pay | Admitting: *Deleted

## 2019-10-15 NOTE — Telephone Encounter (Signed)
Pt called to ask about if he needs covid test prior to having ct and bone scan. The answer is no. They will due the covid testing . He has colonoscopy 12/10. He has to get covid testing before then and wanted to check about the scans. Everything explained to him and he is agreeable

## 2019-10-22 ENCOUNTER — Other Ambulatory Visit: Payer: Self-pay | Admitting: Oncology

## 2019-10-23 ENCOUNTER — Ambulatory Visit
Admission: RE | Admit: 2019-10-23 | Discharge: 2019-10-23 | Disposition: A | Discharge status: Home / Self Care | Payer: PPO | Source: Ambulatory Visit | Attending: Oncology | Admitting: Oncology

## 2019-10-23 ENCOUNTER — Encounter
Admission: RE | Admit: 2019-10-23 | Discharge: 2019-10-23 | Disposition: A | Discharge status: Home / Self Care | Payer: PPO | Source: Ambulatory Visit | Attending: Oncology | Admitting: Oncology

## 2019-10-23 ENCOUNTER — Other Ambulatory Visit: Payer: Self-pay

## 2019-10-23 DIAGNOSIS — C61 Malignant neoplasm of prostate: Secondary | ICD-10-CM

## 2019-10-23 DIAGNOSIS — C779 Secondary and unspecified malignant neoplasm of lymph node, unspecified: Secondary | ICD-10-CM | POA: Diagnosis not present

## 2019-10-23 DIAGNOSIS — C7951 Secondary malignant neoplasm of bone: Secondary | ICD-10-CM | POA: Diagnosis not present

## 2019-10-23 MED ORDER — TECHNETIUM TC 99M MEDRONATE IV KIT
20.0000 | PACK | Freq: Once | INTRAVENOUS | Status: AC | PRN
  Administered 2019-10-23: 22.09 via INTRAVENOUS

## 2019-10-23 MED ORDER — IOHEXOL 300 MG/ML  SOLN
100.0000 mL | Freq: Once | INTRAMUSCULAR | Status: AC | PRN
  Administered 2019-10-23: 11:00:00 100 mL via INTRAVENOUS

## 2019-10-30 DIAGNOSIS — I4891 Unspecified atrial fibrillation: Secondary | ICD-10-CM | POA: Diagnosis not present

## 2019-11-01 MED FILL — predniSONE 5 MG TABS: 5 | 30 days supply | Qty: 60 | Fill #1

## 2019-11-12 MED FILL — ABIRATERONE ACETATE 250 MG: 250 | 30 days supply | Qty: 120 | Fill #2

## 2019-11-14 ENCOUNTER — Other Ambulatory Visit: Payer: Self-pay

## 2019-11-14 ENCOUNTER — Encounter: Payer: Self-pay | Admitting: Oncology

## 2019-11-14 ENCOUNTER — Other Ambulatory Visit: Payer: Self-pay | Admitting: *Deleted

## 2019-11-14 DIAGNOSIS — C61 Malignant neoplasm of prostate: Secondary | ICD-10-CM

## 2019-11-14 DIAGNOSIS — C779 Secondary and unspecified malignant neoplasm of lymph node, unspecified: Secondary | ICD-10-CM

## 2019-11-14 NOTE — Progress Notes (Signed)
Patient stated that he had been doing well with no complaints. 

## 2019-11-15 ENCOUNTER — Inpatient Hospital Stay (HOSPITAL_BASED_OUTPATIENT_CLINIC_OR_DEPARTMENT_OTHER): Payer: PPO | Admitting: Oncology

## 2019-11-15 ENCOUNTER — Other Ambulatory Visit: Payer: Self-pay

## 2019-11-15 ENCOUNTER — Inpatient Hospital Stay: Payer: PPO

## 2019-11-15 ENCOUNTER — Inpatient Hospital Stay: Payer: PPO | Attending: Oncology | Admitting: *Deleted

## 2019-11-15 VITALS — BP 141/78 | HR 68 | Temp 96.1°F | Wt 277.2 lb

## 2019-11-15 DIAGNOSIS — Z7901 Long term (current) use of anticoagulants: Secondary | ICD-10-CM | POA: Diagnosis not present

## 2019-11-15 DIAGNOSIS — C7951 Secondary malignant neoplasm of bone: Secondary | ICD-10-CM | POA: Insufficient documentation

## 2019-11-15 DIAGNOSIS — Z8249 Family history of ischemic heart disease and other diseases of the circulatory system: Secondary | ICD-10-CM | POA: Insufficient documentation

## 2019-11-15 DIAGNOSIS — Z95828 Presence of other vascular implants and grafts: Secondary | ICD-10-CM

## 2019-11-15 DIAGNOSIS — R5383 Other fatigue: Secondary | ICD-10-CM | POA: Diagnosis not present

## 2019-11-15 DIAGNOSIS — M109 Gout, unspecified: Secondary | ICD-10-CM | POA: Insufficient documentation

## 2019-11-15 DIAGNOSIS — Z79899 Other long term (current) drug therapy: Secondary | ICD-10-CM | POA: Insufficient documentation

## 2019-11-15 DIAGNOSIS — N39498 Other specified urinary incontinence: Secondary | ICD-10-CM | POA: Insufficient documentation

## 2019-11-15 DIAGNOSIS — G629 Polyneuropathy, unspecified: Secondary | ICD-10-CM | POA: Diagnosis not present

## 2019-11-15 DIAGNOSIS — I4891 Unspecified atrial fibrillation: Secondary | ICD-10-CM | POA: Insufficient documentation

## 2019-11-15 DIAGNOSIS — Z950 Presence of cardiac pacemaker: Secondary | ICD-10-CM | POA: Diagnosis not present

## 2019-11-15 DIAGNOSIS — Z8546 Personal history of malignant neoplasm of prostate: Secondary | ICD-10-CM | POA: Diagnosis not present

## 2019-11-15 DIAGNOSIS — C61 Malignant neoplasm of prostate: Secondary | ICD-10-CM

## 2019-11-15 DIAGNOSIS — C779 Secondary and unspecified malignant neoplasm of lymph node, unspecified: Secondary | ICD-10-CM

## 2019-11-15 DIAGNOSIS — E860 Dehydration: Secondary | ICD-10-CM

## 2019-11-15 DIAGNOSIS — E785 Hyperlipidemia, unspecified: Secondary | ICD-10-CM | POA: Insufficient documentation

## 2019-11-15 LAB — CBC WITH DIFFERENTIAL/PLATELET
Abs Immature Granulocytes: 0.01 10*3/uL (ref 0.00–0.07)
Basophils Absolute: 0 10*3/uL (ref 0.0–0.1)
Basophils Relative: 0 %
Eosinophils Absolute: 0.1 10*3/uL (ref 0.0–0.5)
Eosinophils Relative: 2 %
HCT: 37.4 % — ABNORMAL LOW (ref 39.0–52.0)
Hemoglobin: 12.3 g/dL — ABNORMAL LOW (ref 13.0–17.0)
Immature Granulocytes: 0 %
Lymphocytes Relative: 21 %
Lymphs Abs: 1.1 10*3/uL (ref 0.7–4.0)
MCH: 31.1 pg (ref 26.0–34.0)
MCHC: 32.9 g/dL (ref 30.0–36.0)
MCV: 94.4 fL (ref 80.0–100.0)
Monocytes Absolute: 0.4 10*3/uL (ref 0.1–1.0)
Monocytes Relative: 8 %
Neutro Abs: 3.5 10*3/uL (ref 1.7–7.7)
Neutrophils Relative %: 69 %
Platelets: 130 10*3/uL — ABNORMAL LOW (ref 150–400)
RBC: 3.96 MIL/uL — ABNORMAL LOW (ref 4.22–5.81)
RDW: 14.2 % (ref 11.5–15.5)
WBC: 5.1 10*3/uL (ref 4.0–10.5)
nRBC: 0 % (ref 0.0–0.2)

## 2019-11-15 LAB — COMPREHENSIVE METABOLIC PANEL
ALT: 12 U/L (ref 0–44)
AST: 16 U/L (ref 15–41)
Albumin: 4.1 g/dL (ref 3.5–5.0)
Alkaline Phosphatase: 57 U/L (ref 38–126)
Anion gap: 9 (ref 5–15)
BUN: 32 mg/dL — ABNORMAL HIGH (ref 8–23)
CO2: 25 mmol/L (ref 22–32)
Calcium: 9.7 mg/dL (ref 8.9–10.3)
Chloride: 105 mmol/L (ref 98–111)
Creatinine, Ser: 0.82 mg/dL (ref 0.61–1.24)
GFR calc Af Amer: >60 mL/min (ref >60)
GFR calc non Af Amer: >60 mL/min (ref >60)
Glucose, Bld: 106 mg/dL — ABNORMAL HIGH (ref 70–99)
Potassium: 3.4 mmol/L — ABNORMAL LOW (ref 3.5–5.1)
Sodium: 139 mmol/L (ref 135–145)
Total Bilirubin: 1.3 mg/dL — ABNORMAL HIGH (ref 0.3–1.2)
Total Protein: 6.9 g/dL (ref 6.5–8.1)

## 2019-11-15 LAB — PSA: Prostatic Specific Antigen: 10.67 ng/mL — ABNORMAL HIGH (ref 0.00–4.00)

## 2019-11-15 MED ORDER — SODIUM CHLORIDE 0.9% FLUSH
10.0000 mL | Freq: Once | INTRAVENOUS | Status: AC
  Administered 2019-11-15: 10 mL via INTRAVENOUS
  Filled 2019-11-15: qty 10

## 2019-11-15 MED ORDER — HEPARIN SOD (PORK) LOCK FLUSH 100 UNIT/ML IV SOLN
500.0000 [IU] | Freq: Once | INTRAVENOUS | Status: AC
  Administered 2019-11-15: 500 [IU] via INTRAVENOUS

## 2019-11-15 MED ORDER — DENOSUMAB 120 MG/1.7ML ~~LOC~~ SOLN
120.0000 mg | SUBCUTANEOUS | Status: AC
  Administered 2019-11-15: 120 mg via SUBCUTANEOUS
  Filled 2019-11-15: qty 1.7

## 2019-11-16 ENCOUNTER — Other Ambulatory Visit
Admission: RE | Admit: 2019-11-16 | Discharge: 2019-11-16 | Disposition: A | Discharge status: Home / Self Care | Payer: PPO | Source: Ambulatory Visit | Attending: General Surgery | Admitting: General Surgery

## 2019-11-16 DIAGNOSIS — Z01812 Encounter for preprocedural laboratory examination: Secondary | ICD-10-CM | POA: Insufficient documentation

## 2019-11-16 DIAGNOSIS — Z20828 Contact with and (suspected) exposure to other viral communicable diseases: Secondary | ICD-10-CM | POA: Insufficient documentation

## 2019-11-16 LAB — SARS CORONAVIRUS 2 (TAT 6-24 HRS): SARS Coronavirus 2: NEGATIVE

## 2019-11-16 NOTE — Progress Notes (Signed)
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  Discussed results of CT scan.  Heme/Onc history: Patient is a73 year old gentleman with a prior history of prostate cancer in 1999 status post radical prostatectomy. He had biochemical recurrence in 2017 and was treated with IM RT. He again began to have a rising PSA in 2010 and underwent orchiectomy in 2010.his PSA was being monitored closely PSA a year ago was 0.5, 5 months ago was 3.9 in 2 months ago was 7.8.he underwent CT abdomen and pelvis in May 2018which showed abnormal periaortic and right common iliac lymph nodes. Index aortocaval lymph node 1.4 cm which was new as compared to February 2014 and suspicious for malignancy. Bone scan did not reveal any evidence of bony metastatic disease. He has been referred to [place] for systemic therapy options for CRPC  Patient lives with his wife and is independent of his ADL's. He has A fib for which he is on coumadin. Also has chronic urinary incontinence from prior prostate surgery and has AUS. Reports 50 pound intentional weight loss since October 2017  Repeat PSA in July 2018 was 13.3 indicating PSA doubling time of 3 months  Docetaxel chemo initiated for CRPC with ln mets and rapid doubling PSA in august 2018.Patient completed 5 cycles of docetaxel on 09/27/2017. Cycle #6 was not given due to progressive fatigue and cytopenias as well as worsening neuropathy. Cycle 5 of docetaxel was dose reduced to 60 mg/m square  In April 2019 patient noted to have rising PSA and increase in the size of  retroperitoneal LN. Patient therefore started second line zytiga  Interval history-patient reports doing well and denies any other complaints at this time.  He has mild chronic fatigue.  Denies any new aches or pains anywhere  ECOG PS- 1 Pain scale- 0 Opioid associated constipation- no  Review of systems- Review of Systems  Constitutional: Positive for malaise/fatigue. Negative for chills, fever and weight loss.  HENT: Negative for congestion, ear discharge and nosebleeds.   Eyes: Negative for blurred vision.  Respiratory: Negative for cough, hemoptysis, sputum production, shortness of breath and wheezing.   Cardiovascular: Negative for chest pain, palpitations, orthopnea and claudication.  Gastrointestinal: Negative for abdominal pain, blood in stool, constipation, diarrhea, heartburn, melena, nausea and vomiting.  Genitourinary: Negative for dysuria, flank pain, frequency, hematuria and urgency.  Musculoskeletal: Negative for back pain, joint pain and myalgias.  Skin: Negative for rash.  Neurological: Negative for dizziness, tingling, focal weakness, seizures, weakness and headaches.  Endo/Heme/Allergies: Does not bruise/bleed easily.  Psychiatric/Behavioral: Negative for depression and suicidal ideas. The patient does not have insomnia.      Allergies  Allergen Reactions   Latex Rash    Sensitivity, not allergy.     Past Medical History:  Diagnosis Date   Arthritis, degenerative 10/30/2015   Atrial fibrillation ([place]) 10/30/2015   Bladder spasm 06/12/2013   Carcinoma of prostate ([place]) 10/30/2015   Cardiomyopathy ([place]) 09/10/2014   Overview:  EF 35%,4/15    Cerebrovascular accident (CVA) ([place]) 10/30/2015   Overview:  Left embolic RFF,6384    Difficult or painful urination 11/21/2013   Dysrhythmia  Gout 10/30/2015   History of kidney stones    Malignant neoplasm of prostate ([place]) 09/05/2012   Sinoatrial node dysfunction ([place]) 10/30/2015   Overview:  DDD  pacemaker, 1996      Past Surgical History:  Procedure Laterality Date   castration  10/2009   CATARACT EXTRACTION W/ INTRAOCULAR LENS  IMPLANT, BILATERAL Bilateral 2016   right eye done then the left one done 3 months later in 2016   [place] Left    INSERT / REPLACE / REMOVE PACEMAKER     IR [place] RIGHT  07/01/2017   JOINT REPLACEMENT Bilateral 1980   hips   left knee replacement  2014   PACEMAKER INSERTION     PROSTATECTOMY     REPLACEMENT TOTAL HIP W/  RESURFACING IMPLANTS  1997   bilateral-    REPLACEMENT TOTAL KNEE Bilateral    testes removal     per pt    TONSILLECTOMY AND ADENOIDECTOMY     TRANSURETHRAL RESECTION OF BLADDER TUMOR N/A 12/31/2015   Procedure: BLADDER BIOPSY;  Surgeon: Nickie Retort, MD;  Location: [facility] ORS;  Service: Urology;  Laterality: N/A;   Urinary incontinence valve     AMS    Social History   Socioeconomic History   Marital status: Married    Spouse name: Not on file   Number of children: Not on file   Years of education: Not on file   Highest education level: Not on file  Occupational History   Not on file  Social Needs   Financial resource strain: Not on file   Food insecurity    Worry: Not on file    Inability: Not on file   Transportation needs    Medical: Not on file    Non-medical: Not on file  Tobacco Use   Smoking status: Never Smoker   Smokeless tobacco: Never Used  Substance and Sexual Activity   Alcohol use: No    Alcohol/week: 0.0 standard drinks   Drug use: No   Sexual activity: Never  Lifestyle   Physical activity    Days per week: Not on file    Minutes per session: Not on file   Stress: Not on file  Relationships   Social connections    Talks on phone: Not on file    Gets together: Not on file    Attends religious service: Not on file    Active member of club or organization: Not on file    Attends meetings of clubs or organizations: Not  on file    Relationship status: Not on file   Intimate partner violence    Fear of current or ex partner: Not on file    Emotionally abused: Not on file    Physically abused: Not on file    Forced sexual activity: Not on file  Other Topics Concern   Not on file  Social History Narrative   Not on file    Family History  Problem Relation Age of Onset   Cancer Mother    Arthritis Mother    Heart attack Father    Arthritis Father    Transient ischemic attack Brother    Prostate cancer Neg Hx    Bladder Cancer Neg Hx      Current Outpatient Medications:    Abiraterone Acetate (ZYTIGA PO), Take 4 capsules by mouth daily. , Disp: , Rfl:    acetaminophen (TYLENOL) 325 MG tablet, Take 650 mg by mouth 2 (two)  times daily., Disp: , Rfl:    allopurinol (ZYLOPRIM) 300 MG tablet, Take by mouth daily. In am., Disp: , Rfl:    bisacodyl (DULCOLAX) 5 MG EC tablet, Take 5 mg by mouth 2 (two) times a day., Disp: , Rfl:    calcium carbonate (CALCIUM 600) 600 MG TABS tablet, Take 600 mg by mouth 2 (two) times daily with a meal., Disp: , Rfl:    cyanocobalamin (,VITAMIN B-12,) 1000 MCG/ML injection, Inject 1,000 mcg into the muscle every 30 (thirty) days. , Disp: , Rfl:    hydrochlorothiazide (HYDRODIURIL) 25 MG tablet, Take 25 mg by mouth. In am, Disp: , Rfl:    lidocaine-prilocaine (EMLA) cream, , Disp: , Rfl:    phentermine 15 MG capsule, Take 15 mg by mouth every morning., Disp: , Rfl:    potassium chloride (K-DUR) 10 MEQ tablet, TAKE 2 TABLETS BY MOUTH ONCE DAILY, Disp: 60 tablet, Rfl: 1   predniSONE (DELTASONE) 5 MG tablet, TAKE 1 TABLET (5 MG TOTAL) BY MOUTH 2 TIMES DAILY WITH A MEAL., Disp: 60 tablet, Rfl: 5   simvastatin (ZOCOR) 40 MG tablet, Take 40 mg by mouth daily at 6 PM. , Disp: , Rfl:    warfarin (COUMADIN) 2 MG tablet, Take 2 mg by mouth. , Disp: , Rfl:    warfarin (COUMADIN) 5 MG tablet, , Disp: , Rfl:  No current facility-administered medications for this  visit.   Facility-Administered Medications Ordered in Other Visits:    denosumab (XGEVA) injection 120 mg, 120 mg, Subcutaneous, Q28 days, Sindy Guadeloupe, MD, 120 mg at 09/07/19 1144   denosumab (XGEVA) injection 120 mg, 120 mg, Subcutaneous, Q28 days, Sindy Guadeloupe, MD, 120 mg at 11/15/19 1158  Physical exam:  Vitals:   11/15/19 1134  BP: (!) 141/78  Pulse: 68  Temp: (!) 96.1 F (35.6 C)  TempSrc: Tympanic  Weight: 277 lb 3.2 oz (125.7 kg)   Physical Exam Constitutional:      General: He is not in acute distress. HENT:     Head: Normocephalic and atraumatic.  Eyes:     Pupils: Pupils are equal, round, and reactive to light.  Neck:     Musculoskeletal: Normal range of motion.  Cardiovascular:     Rate and Rhythm: Normal rate. Rhythm irregular.     Heart sounds: Normal heart sounds.  Pulmonary:     Effort: Pulmonary effort is normal.     Breath sounds: Normal breath sounds.  Abdominal:     General: Bowel sounds are normal.     Palpations: Abdomen is soft.  Skin:    General: Skin is warm and dry.  Neurological:     Mental Status: He is alert and oriented to person, place, and time.      CMP Latest Ref Rng & Units 11/15/2019  Glucose 70 - 99 mg/dL 106(H)  BUN 8 - 23 mg/dL 32(H)  Creatinine 0.61 - 1.24 mg/dL 0.82  Sodium 135 - 145 mmol/L 139  Potassium 3.5 - 5.1 mmol/L 3.4(L)  Chloride 98 - 111 mmol/L 105  CO2 22 - 32 mmol/L 25  Calcium 8.9 - 10.3 mg/dL 9.7  Total Protein 6.5 - 8.1 g/dL 6.9  Total Bilirubin 0.3 - 1.2 mg/dL 1.3(H)  Alkaline Phos 38 - 126 U/L 57  AST 15 - 41 U/L 16  ALT 0 - 44 U/L 12   CBC Latest Ref Rng & Units 11/15/2019  WBC 4.0 - 10.5 K/uL 5.1  Hemoglobin 13.0 - 17.0 g/dL 12.3(L)  Hematocrit 39.0 - 52.0 % 37.4(L)  Platelets 150 - 400 K/uL 130(L)    No images are attached to the encounter.  Ct Chest W Contrast  Result Date: 10/23/2019 CLINICAL DATA:  Restaging prostate cancer with lymph node metastasis. EXAM: CT CHEST, ABDOMEN, AND  PELVIS WITH CONTRAST TECHNIQUE: Multidetector CT imaging of the chest, abdomen and pelvis was performed following the standard protocol during bolus administration of intravenous contrast. CONTRAST:  123m OMNIPAQUE IOHEXOL 300 MG/ML  SOLN COMPARISON:  10/11/2018 FINDINGS: CT CHEST FINDINGS Cardiovascular: The heart size is mildly enlarged. No pericardial effusion. Left main, lad, left circumflex and RCA coronary artery calcifications. Mediastinum/Nodes: No enlarged mediastinal, hilar, or axillary lymph nodes. Thyroid gland, trachea, and esophagus demonstrate no significant findings. Lungs/Pleura: No pleural effusion identified. Mild diffuse bronchial wall thickening identified. No airspace consolidation, atelectasis or pneumothorax identified. The subpleural nodule within the posterior aspect of the superior segment of left lower lobe is unchanged measuring 4 mm. Anterior lateral right lower lobe lung nodule measures 2.9 cm, image 90/507. No new or enlarging pulmonary nodules or masses identified. Musculoskeletal: Bilateral glenohumeral joint osteoarthritis. Focal area of increased sclerosis involving the body of scapula is noted, image 73/501. Increased from previous exam. Focal area of increased sclerosis involving the anterior aspect of T4 has also progressed from previous exam, a image 99/501. Progressive sclerosis involving the pedicle of T7 on the right. New superior endplate deformity involving the T3 vertebra is noted with approximately 15% loss of vertebral body height. CT ABDOMEN PELVIS FINDINGS Hepatobiliary: No focal liver abnormality is seen. No gallstones, gallbladder wall thickening, or biliary dilatation. Pancreas: Unremarkable. No pancreatic ductal dilatation or surrounding inflammatory changes. Spleen: Normal in size without focal abnormality. Adrenals/Urinary Tract: Normal appearance of the adrenal glands. Bilateral renal cortical atrophy and scarring. No hydronephrosis identified bilaterally.  The urinary bladder is largely obscured by beam hardening artifact from patient's bilateral hip arthroplasty devices. Stomach/Bowel: Stomach is within normal limits. No evidence of bowel wall thickening, distention, or inflammatory changes. Vascular/Lymphatic: Aortic atherosclerosis. No aneurysm. Prominent retroperitoneal lymph nodes are again identified. Index aortocaval node measures 1.1 cm, image 71/504. Previously 0.7 cm. New left periaortic node measures 1.2 cm, image 64/504. Reproductive: Status post prostatectomy. Penile prosthesis. Other: No free fluid or fluid collections. Musculoskeletal: Status post bilateral hip arthroplasty. Lucency within the left anterior acetabulum adjacent to the arthroplasty device is noted suggestive of particle where osteolysis. T12 compression fracture is stable. No suspicious bone lesions. IMPRESSION: 1. Stable appearance of small nonspecific pulmonary nodules. 2. Increase in size of retroperitoneal lymph nodes worrisome for metastatic adenopathy. 3. Slight progression of sclerotic bone metastasis involving the left scapula, anterior T4 vertebral body, and the right posterior elements of T7. 4. Aortic atherosclerosis. Multi vessel coronary artery calcifications noted. 5. Lucency adjacent to the left hip arthroplasty device suggestive of particle osteolysis. 6. New superior endplate deformity involving the T3 vertebra is identified with approximately 15% loss of vertebral body height. Stable T12 compression fracture. 7. Mild diffuse bronchial wall thickening with emphysema, as above; imaging findings suggestive of underlying COPD. Aortic Atherosclerosis (ICD10-I70.0) and Emphysema (ICD10-J43.9). Electronically Signed   By: TKerby MoorsM.D.   On: 10/23/2019 13:27   Nm Bone Scan Whole Body  Addendum Date: 10/23/2019   ADDENDUM REPORT: 10/23/2019 18:06 ADDENDUM: Omitted from the initial report is presence of a LEFT parietal metastatic lesion, grossly unchanged.  Electronically Signed   By: MLavonia DanaM.D.   On: 10/23/2019 18:06   Result Date: 10/23/2019 CLINICAL  DATA:  Prostate cancer with known metastatic disease to scapula, BILATERAL shoulder pain, arthritis at multiple joints EXAM: NUCLEAR MEDICINE WHOLE BODY BONE SCAN TECHNIQUE: Whole body anterior and posterior images were obtained approximately 3 hours after intravenous injection of radiopharmaceutical. RADIOPHARMACEUTICALS:  22.09 mCi Technetium-40mMDP IV COMPARISON:  10/11/2018 Correlation: CT chest abdomen and pelvis 10/23/2019 FINDINGS: Foci of abnormal increased tracer accumulation are seen inferior LEFT scapula and at the posterior RIGHT third rib suspicious for metastases. RIGHT third rib lesion is new since the prior study. BILATERAL hip and knee prostheses. Focal area of increased tracer accumulation identified adjacent to the proximal portion of the femoral component of the LEFT hip prosthesis new since prior exam, could represent a complication of the prosthesis or a metastatic focus. Significant uptake at the shoulder joints bilaterally, likely degenerative, corresponding to advanced degenerative disc disease changes seen at the margin of the CT exam. Expected urinary tract and soft tissue distribution of tracer. IMPRESSION: Suspected osseous metastatic lesions involving the inferior LEFT scapula and the posterior RIGHT third rib. Extensive degenerative type uptake at the shoulder joints bilaterally. Interval decrease in uptake at multiple adjacent posterior RIGHT ribs consistent with healing fractures. Focus of uptake identified adjacent to the lateral aspect of the femoral component of the LEFT hip prosthesis, could be related to a complication of the hip prosthesis, stress response, or metastatic lesion; dedicated LEFT hip radiographs recommended to assess. Electronically Signed: By: MLavonia DanaM.D. On: 10/23/2019 18:01   Ct Abdomen Pelvis W Contrast  Result Date: 10/23/2019 CLINICAL DATA:   Restaging prostate cancer with lymph node metastasis. EXAM: CT CHEST, ABDOMEN, AND PELVIS WITH CONTRAST TECHNIQUE: Multidetector CT imaging of the chest, abdomen and pelvis was performed following the standard protocol during bolus administration of intravenous contrast. CONTRAST:  1074mOMNIPAQUE IOHEXOL 300 MG/ML  SOLN COMPARISON:  10/11/2018 FINDINGS: CT CHEST FINDINGS Cardiovascular: The heart size is mildly enlarged. No pericardial effusion. Left main, lad, left circumflex and RCA coronary artery calcifications. Mediastinum/Nodes: No enlarged mediastinal, hilar, or axillary lymph nodes. Thyroid gland, trachea, and esophagus demonstrate no significant findings. Lungs/Pleura: No pleural effusion identified. Mild diffuse bronchial wall thickening identified. No airspace consolidation, atelectasis or pneumothorax identified. The subpleural nodule within the posterior aspect of the superior segment of left lower lobe is unchanged measuring 4 mm. Anterior lateral right lower lobe lung nodule measures 2.9 cm, image 90/507. No new or enlarging pulmonary nodules or masses identified. Musculoskeletal: Bilateral glenohumeral joint osteoarthritis. Focal area of increased sclerosis involving the body of scapula is noted, image 73/501. Increased from previous exam. Focal area of increased sclerosis involving the anterior aspect of T4 has also progressed from previous exam, a image 99/501. Progressive sclerosis involving the pedicle of T7 on the right. New superior endplate deformity involving the T3 vertebra is noted with approximately 15% loss of vertebral body height. CT ABDOMEN PELVIS FINDINGS Hepatobiliary: No focal liver abnormality is seen. No gallstones, gallbladder wall thickening, or biliary dilatation. Pancreas: Unremarkable. No pancreatic ductal dilatation or surrounding inflammatory changes. Spleen: Normal in size without focal abnormality. Adrenals/Urinary Tract: Normal appearance of the adrenal glands.  Bilateral renal cortical atrophy and scarring. No hydronephrosis identified bilaterally. The urinary bladder is largely obscured by beam hardening artifact from patient's bilateral hip arthroplasty devices. Stomach/Bowel: Stomach is within normal limits. No evidence of bowel wall thickening, distention, or inflammatory changes. Vascular/Lymphatic: Aortic atherosclerosis. No aneurysm. Prominent retroperitoneal lymph nodes are again identified. Index aortocaval node measures 1.1 cm, image 71/504. Previously 0.7 cm. New  left periaortic node measures 1.2 cm, image 64/504. Reproductive: Status post prostatectomy. Penile prosthesis. Other: No free fluid or fluid collections. Musculoskeletal: Status post bilateral hip arthroplasty. Lucency within the left anterior acetabulum adjacent to the arthroplasty device is noted suggestive of particle where osteolysis. T12 compression fracture is stable. No suspicious bone lesions. IMPRESSION: 1. Stable appearance of small nonspecific pulmonary nodules. 2. Increase in size of retroperitoneal lymph nodes worrisome for metastatic adenopathy. 3. Slight progression of sclerotic bone metastasis involving the left scapula, anterior T4 vertebral body, and the right posterior elements of T7. 4. Aortic atherosclerosis. Multi vessel coronary artery calcifications noted. 5. Lucency adjacent to the left hip arthroplasty device suggestive of particle osteolysis. 6. New superior endplate deformity involving the T3 vertebra is identified with approximately 15% loss of vertebral body height. Stable T12 compression fracture. 7. Mild diffuse bronchial wall thickening with emphysema, as above; imaging findings suggestive of underlying COPD. Aortic Atherosclerosis (ICD10-I70.0) and Emphysema (ICD10-J43.9). Electronically Signed   By: Kerby Moors M.D.   On: 10/23/2019 13:27     Assessment and plan- Patient is a 73 y.o. male with history of castrate resistant metastatic prostate cancer with  metastases to the lymph nodes and scapula.    Is currently on Zytiga and is here for routine follow-up of prostate cancer and discuss results of CT scans  1. CT scan and bone scan reveals mild progression of disease.  There is mild increase in the size of retroperitoneal lymph nodes from prior signs of 0.7 cm to 1.1 cm.  There is also a new left para periaortic node measuring 1.2 cm.  Slight progression of sclerotic bone metastases involving the left scapula and T4 vertebral body.  Patient's PSA has been slowly going up since March 2020 from 6-8 to a present value of 10.  Is currently on second line treatment after progression of disease on docetaxel.  He has not had any repeat biopsies recently and he has been presumptively treated for prostate cancer based on his original diagnosis of prostate cancer back in 1999.    I will discuss his case at tumor board next week to see if it would be feasible to biopsy one of his retroperitoneal lymph nodes sent for NGS testing.  Patient has low-volume disease and a gradual rise in his PSA and hence I am inclined to continue Zytiga at this time and not switch him to [place] just yet.  Patient verbalized understanding  2.  Patient will receive Delton See today for his bone metastases and I will see him back in 1 month's time with CBC with differential, CMP and PSA for Xgeva and to discuss further plan of care for his prostate cancer   Visit Diagnosis 1. High risk medication use   2. Malignant neoplasm of prostate [facility])      Dr. Randa Evens, MD, MPH [facility] at [facility] [phone] 11/16/2019 12:58 PM

## 2019-11-21 ENCOUNTER — Encounter: Payer: Self-pay | Admitting: Emergency Medicine

## 2019-11-21 ENCOUNTER — Ambulatory Visit
Admission: RE | Admit: 2019-11-21 | Discharge: 2019-11-21 | Disposition: A | Discharge status: Home / Self Care | Payer: PPO | Attending: General Surgery | Admitting: General Surgery

## 2019-11-21 ENCOUNTER — Ambulatory Visit: Payer: PPO | Admitting: Anesthesiology

## 2019-11-21 ENCOUNTER — Encounter
Admission: RE | Disposition: A | Discharge status: Home / Self Care | Payer: Self-pay | Source: Home / Self Care | Attending: General Surgery

## 2019-11-21 DIAGNOSIS — M109 Gout, unspecified: Secondary | ICD-10-CM | POA: Diagnosis not present

## 2019-11-21 DIAGNOSIS — I4891 Unspecified atrial fibrillation: Secondary | ICD-10-CM | POA: Diagnosis not present

## 2019-11-21 DIAGNOSIS — Z8546 Personal history of malignant neoplasm of prostate: Secondary | ICD-10-CM | POA: Diagnosis not present

## 2019-11-21 DIAGNOSIS — Z7901 Long term (current) use of anticoagulants: Secondary | ICD-10-CM | POA: Insufficient documentation

## 2019-11-21 DIAGNOSIS — Z86010 Personal history of colonic polyps: Secondary | ICD-10-CM | POA: Diagnosis not present

## 2019-11-21 DIAGNOSIS — Z9842 Cataract extraction status, left eye: Secondary | ICD-10-CM | POA: Diagnosis not present

## 2019-11-21 DIAGNOSIS — Z1211 Encounter for screening for malignant neoplasm of colon: Secondary | ICD-10-CM | POA: Diagnosis not present

## 2019-11-21 DIAGNOSIS — D120 Benign neoplasm of cecum: Secondary | ICD-10-CM | POA: Insufficient documentation

## 2019-11-21 DIAGNOSIS — Z91040 Latex allergy status: Secondary | ICD-10-CM | POA: Insufficient documentation

## 2019-11-21 DIAGNOSIS — Z79899 Other long term (current) drug therapy: Secondary | ICD-10-CM | POA: Diagnosis not present

## 2019-11-21 DIAGNOSIS — Z950 Presence of cardiac pacemaker: Secondary | ICD-10-CM | POA: Diagnosis not present

## 2019-11-21 DIAGNOSIS — I429 Cardiomyopathy, unspecified: Secondary | ICD-10-CM | POA: Diagnosis not present

## 2019-11-21 DIAGNOSIS — Z96653 Presence of artificial knee joint, bilateral: Secondary | ICD-10-CM | POA: Insufficient documentation

## 2019-11-21 DIAGNOSIS — E782 Mixed hyperlipidemia: Secondary | ICD-10-CM | POA: Diagnosis not present

## 2019-11-21 DIAGNOSIS — Z961 Presence of intraocular lens: Secondary | ICD-10-CM | POA: Diagnosis not present

## 2019-11-21 DIAGNOSIS — D123 Benign neoplasm of transverse colon: Secondary | ICD-10-CM | POA: Insufficient documentation

## 2019-11-21 DIAGNOSIS — Z8673 Personal history of transient ischemic attack (TIA), and cerebral infarction without residual deficits: Secondary | ICD-10-CM | POA: Diagnosis not present

## 2019-11-21 DIAGNOSIS — Z96643 Presence of artificial hip joint, bilateral: Secondary | ICD-10-CM | POA: Diagnosis not present

## 2019-11-21 DIAGNOSIS — Z7952 Long term (current) use of systemic steroids: Secondary | ICD-10-CM | POA: Diagnosis not present

## 2019-11-21 DIAGNOSIS — D122 Benign neoplasm of ascending colon: Secondary | ICD-10-CM | POA: Diagnosis not present

## 2019-11-21 DIAGNOSIS — M1990 Unspecified osteoarthritis, unspecified site: Secondary | ICD-10-CM | POA: Diagnosis not present

## 2019-11-21 DIAGNOSIS — Z9079 Acquired absence of other genital organ(s): Secondary | ICD-10-CM | POA: Insufficient documentation

## 2019-11-21 DIAGNOSIS — Z9841 Cataract extraction status, right eye: Secondary | ICD-10-CM | POA: Insufficient documentation

## 2019-11-21 DIAGNOSIS — D126 Benign neoplasm of colon, unspecified: Secondary | ICD-10-CM | POA: Diagnosis not present

## 2019-11-21 DIAGNOSIS — K635 Polyp of colon: Secondary | ICD-10-CM | POA: Diagnosis not present

## 2019-11-21 HISTORY — PX: COLONOSCOPY WITH PROPOFOL: SHX5780

## 2019-11-21 SURGERY — COLONOSCOPY WITH PROPOFOL
Anesthesia: General

## 2019-11-21 MED ORDER — ESMOLOL HCL 100 MG/10ML IV SOLN
INTRAVENOUS | Status: DC | PRN
  Administered 2019-11-21 (×2): 10 mg via INTRAVENOUS

## 2019-11-21 MED ORDER — PROPOFOL 500 MG/50ML IV EMUL
INTRAVENOUS | Status: AC
  Filled 2019-11-21: qty 50

## 2019-11-21 MED ORDER — SODIUM CHLORIDE 0.9 % IV SOLN
INTRAVENOUS | Status: DC
  Administered 2019-11-21: 1000 mL via INTRAVENOUS

## 2019-11-21 MED ORDER — PROPOFOL 500 MG/50ML IV EMUL
INTRAVENOUS | Status: DC | PRN
  Administered 2019-11-21: 130 ug/kg/min via INTRAVENOUS

## 2019-11-21 MED ORDER — PHENYLEPHRINE HCL (PRESSORS) 10 MG/ML IV SOLN
INTRAVENOUS | Status: DC | PRN
  Administered 2019-11-21 (×3): 100 ug via INTRAVENOUS

## 2019-11-21 MED ORDER — LIDOCAINE 2% (20 MG/ML) 5 ML SYRINGE
INTRAMUSCULAR | Status: DC | PRN
  Administered 2019-11-21: 50 mg via INTRAVENOUS

## 2019-11-21 MED ORDER — PROPOFOL 10 MG/ML IV BOLUS
INTRAVENOUS | Status: AC
  Filled 2019-11-21: qty 20

## 2019-11-21 MED ORDER — PROPOFOL 10 MG/ML IV BOLUS
INTRAVENOUS | Status: DC | PRN
  Administered 2019-11-21: 70 mg via INTRAVENOUS

## 2019-11-21 NOTE — Anesthesia Postprocedure Evaluation (Signed)
Anesthesia Post Note  Patient: Jeremy Carney  Procedure(s) Performed: COLONOSCOPY WITH PROPOFOL (N/A )  Patient location during evaluation: Endoscopy Anesthesia Type: General Level of consciousness: awake and alert and oriented Pain management: pain level controlled Vital Signs Assessment: post-procedure vital signs reviewed and stable Respiratory status: spontaneous breathing Cardiovascular status: blood pressure returned to baseline Anesthetic complications: no     Last Vitals:  Vitals:   11/21/19 1135 11/21/19 1145  BP: 106/68 108/65  Pulse:  71  Resp: (!) 26 (!) 26  Temp:    SpO2:  91%    Last Pain:  Vitals:   11/21/19 1125  TempSrc:   PainSc: Asleep                 Katalaya Beel

## 2019-11-21 NOTE — Anesthesia Preprocedure Evaluation (Signed)
Anesthesia Evaluation  Patient identified by MRN, date of birth, ID band Patient awake    Reviewed: Allergy & Precautions, NPO status , Patient's Chart, lab work & pertinent test results  History of Anesthesia Complications Negative for: history of anesthetic complications  Airway Mallampati: II       Dental   Pulmonary neg sleep apnea, neg COPD, Not current smoker,           Cardiovascular (-) hypertension(-) Past MI and (-) CHF + dysrhythmias Atrial Fibrillation + pacemaker (-) Valvular Problems/Murmurs     Neuro/Psych neg Seizures CVA (speech difficulties remain, weakness resolved)    GI/Hepatic Neg liver ROS, neg GERD  ,  Endo/Other  neg diabetes  Renal/GU negative Renal ROS     Musculoskeletal   Abdominal   Peds  Hematology   Anesthesia Other Findings   Reproductive/Obstetrics                             Anesthesia Physical Anesthesia Plan  ASA: III  Anesthesia Plan: General   Post-op Pain Management:    Induction: Intravenous  PONV Risk Score and Plan: 2 and Propofol infusion and TIVA  Airway Management Planned: Nasal Cannula  Additional Equipment:   Intra-op Plan:   Post-operative Plan:   Informed Consent: I have reviewed the patients History and Physical, chart, labs and discussed the procedure including the risks, benefits and alternatives for the proposed anesthesia with the patient or authorized representative who has indicated his/her understanding and acceptance.       Plan Discussed with:   Anesthesia Plan Comments:         Anesthesia Quick Evaluation

## 2019-11-21 NOTE — Op Note (Signed)
[facility] Gastroenterology Patient Name: Jeremy Carney Procedure Date: 11/21/2019 10:27 AM MRN: [id number] Account #: [id number] Date of Birth: Nov 30, 1946 Admit Type: Outpatient Age: 73 Room: [facility] ENDO ROOM 1 Gender: Male Note Status: Finalized Procedure:             Colonoscopy Indications:           High risk colon cancer surveillance: Personal history                         of colonic polyps Providers:             Robert Bellow, MD Medicines:             Monitored Anesthesia Care Complications:         No immediate complications. Procedure:             Pre-Anesthesia Assessment:                        - Prior to the procedure, a History and Physical was                         performed, and patient medications, allergies and                         sensitivities were reviewed. The patient's tolerance                         of previous anesthesia was reviewed.                        - The risks and benefits of the procedure and the                         sedation options and risks were discussed with the                         patient. All questions were answered and informed                         consent was obtained.                        After obtaining informed consent, the colonoscope was                         passed under direct vision. Throughout the procedure,                         the patient's blood pressure, pulse, and oxygen                         saturations were monitored continuously. The                         Colonoscope was introduced through the anus and                         advanced to the the cecum, identified by appendiceal  orifice and ileocecal valve. The colonoscopy was                         performed without difficulty. The patient tolerated                         the procedure well. The quality of the bowel                         preparation was adequate to identify polyps. Findings:    A 5 mm polyp was found in the mid transverse colon. The polyp was       sessile. Biopsies were taken with a cold forceps for histology.      Two sessile polyps were found in the cecum. The polyps were 10 mm in       size. These polyps were removed with a hot snare. Resection and       retrieval were complete.      A 13 mm polyp was found in the ascending colon. The polyp was       semi-pedunculated. The polyp was removed with a hot snare. Resection and       retrieval were complete.      A 9 mm polyp was found in the proximal transverse colon. The polyp was       sessile. The polyp was removed with a hot snare. Resection and retrieval       were complete.      The retroflexed view of the distal rectum and anal verge was normal and       showed no anal or rectal abnormalities. Impression:            - One 5 mm polyp in the mid transverse colon. Biopsied.                        - Two 10 mm polyps in the cecum, removed with a hot                         snare. Resected and retrieved.                        - One 13 mm polyp in the ascending colon, removed with                         a hot snare. Resected and retrieved.                        - One 9 mm polyp in the proximal transverse colon,                         removed with a hot snare. Resected and retrieved.                        - The distal rectum and anal verge are normal on                         retroflexion view. Recommendation:        - Telephone endoscopist for pathology results in 1  week. Procedure Code(s):     --- Professional ---                        [phone], Colonoscopy, flexible; with removal of                         tumor(s), polyp(s), or other lesion(s) by snare                         technique                        45380, 66, Colonoscopy, flexible; with biopsy, single                         or multiple Diagnosis Code(s):     --- Professional ---                        K63.5, Polyp of  colon                        Z86.010, Personal history of colonic polyps CPT copyright 2019 [employer]. All rights reserved. The codes documented in this report are preliminary and upon coder review may  be revised to meet current compliance requirements. Robert Bellow, MD 11/21/2019 11:14:26 AM This report has been signed electronically. Number of Addenda: 0 Note Initiated On: 11/21/2019 10:27 AM Scope Withdrawal Time: 0 hours 25 minutes 58 seconds  Total Procedure Duration: 0 hours 35 minutes 42 seconds       [facility]

## 2019-11-21 NOTE — Anesthesia Post-op Follow-up Note (Signed)
Anesthesia QCDR form completed.        
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
## 2019-11-21 NOTE — Transfer of Care (Signed)
Immediate Anesthesia Transfer of Care Note  Patient: NEAVEN DAISY  Procedure(s) Performed: COLONOSCOPY WITH PROPOFOL (N/A )  Patient Location: Endoscopy Unit  Anesthesia Type:General  Level of Consciousness: sedated  Airway & Oxygen Therapy: Patient connected to nasal cannula oxygen  Post-op Assessment: Post -op Vital signs reviewed and stable  Post vital signs: stable  Last Vitals:  Vitals Value Taken Time  BP 97/46 11/21/19 1117  Temp 36.2 C 11/21/19 1115  Pulse 85 11/21/19 1117  Resp 16 11/21/19 1117  SpO2 98 % 11/21/19 1117  Vitals shown include unvalidated device data.  Last Pain:  Vitals:   11/21/19 1115  TempSrc: Temporal  PainSc:          Complications: No apparent anesthesia complications

## 2019-11-22 ENCOUNTER — Other Ambulatory Visit: Payer: PPO

## 2019-11-22 ENCOUNTER — Encounter: Payer: Self-pay | Admitting: Pharmacy Technician

## 2019-11-22 ENCOUNTER — Encounter: Payer: Self-pay | Admitting: *Deleted

## 2019-11-22 LAB — SURGICAL PATHOLOGY

## 2019-11-22 NOTE — Progress Notes (Signed)
Tumor Board Documentation  Jeremy Carney was presented by Dr Janese Banks at our Tumor Board on 11/22/2019, which included representatives from medical oncology, radiation oncology, pathology, navigation, genetics, pharmacy, internal medicine, surgical oncology, surgical, radiology, palliative care, research.  Jeremy Carney currently presents as a current patient, for discussion with history of the following treatments: active survellience, adjuvant radiation, adjuvant chemotherapy, immunotherapy.  Additionally, we reviewed previous medical and familial history, history of present illness, and recent lab results along with all available histopathologic and imaging studies. The tumor board considered available treatment options and made the following recommendations: Active surveillance Observe for now  The following procedures/referrals were also placed: No orders of the defined types were placed in this encounter.   Clinical Trial Status: not discussed   Staging used:    National site-specific guidelines   were discussed with respect to the case.  Tumor board is a meeting of clinicians from various specialty areas who evaluate and discuss patients for whom a multidisciplinary approach is being considered. Final determinations in the plan of care are those of the provider(s). The responsibility for follow up of recommendations given during tumor board is that of the provider.   Today's extended care, comprehensive team conference, Yeriel was not present for the discussion and was not examined.   Multidisciplinary Tumor Board is a multidisciplinary case peer review process.  Decisions discussed in the Multidisciplinary Tumor Board reflect the opinions of the specialists present at the conference without having examined the patient.  Ultimately, treatment and diagnostic decisions rest with the primary provider(s) and the patient.

## 2019-11-22 NOTE — Progress Notes (Signed)
Oral Oncology Patient Advocate Encounter   Was successful in securing patient an $7,300 grant from [facility]) to provide copayment coverage for xgeva.         The billing information is as follows   Member [id number] Angie: [phone] Dates of Eligibility:12/15/19 through 12/13/20  Fund: prostate

## 2019-11-26 ENCOUNTER — Telehealth: Payer: Self-pay | Admitting: *Deleted

## 2019-11-26 NOTE — Telephone Encounter (Signed)
I called the pt and wanted to see if he had got his potassium because the pharmacy sent an error on the potassium prescription. He states that he does have potassium rx and he asked for it refill and he will let me know if he does not get the refill then he will call me back

## 2019-11-30 MED FILL — predniSONE 5 MG TABS: 5 | 30 days supply | Qty: 60 | Fill #2

## 2019-12-03 DIAGNOSIS — I4891 Unspecified atrial fibrillation: Secondary | ICD-10-CM | POA: Diagnosis not present

## 2019-12-10 MED FILL — ABIRATERONE ACETATE 250 MG: 250 | 30 days supply | Qty: 120 | Fill #3

## 2019-12-17 ENCOUNTER — Inpatient Hospital Stay: Payer: PPO | Attending: Oncology

## 2019-12-17 ENCOUNTER — Encounter: Payer: Self-pay | Admitting: Oncology

## 2019-12-17 ENCOUNTER — Inpatient Hospital Stay (HOSPITAL_BASED_OUTPATIENT_CLINIC_OR_DEPARTMENT_OTHER): Payer: PPO | Admitting: Oncology

## 2019-12-17 ENCOUNTER — Other Ambulatory Visit: Payer: Self-pay

## 2019-12-17 ENCOUNTER — Inpatient Hospital Stay: Payer: PPO

## 2019-12-17 VITALS — BP 120/80 | HR 69 | Temp 97.4°F | Ht 72.0 in | Wt 277.0 lb

## 2019-12-17 DIAGNOSIS — Z8261 Family history of arthritis: Secondary | ICD-10-CM | POA: Diagnosis not present

## 2019-12-17 DIAGNOSIS — G620 Drug-induced polyneuropathy: Secondary | ICD-10-CM | POA: Diagnosis not present

## 2019-12-17 DIAGNOSIS — Z8673 Personal history of transient ischemic attack (TIA), and cerebral infarction without residual deficits: Secondary | ICD-10-CM | POA: Diagnosis not present

## 2019-12-17 DIAGNOSIS — C61 Malignant neoplasm of prostate: Secondary | ICD-10-CM

## 2019-12-17 DIAGNOSIS — Z950 Presence of cardiac pacemaker: Secondary | ICD-10-CM | POA: Diagnosis not present

## 2019-12-17 DIAGNOSIS — M109 Gout, unspecified: Secondary | ICD-10-CM | POA: Insufficient documentation

## 2019-12-17 DIAGNOSIS — E785 Hyperlipidemia, unspecified: Secondary | ICD-10-CM | POA: Insufficient documentation

## 2019-12-17 DIAGNOSIS — Z79899 Other long term (current) drug therapy: Secondary | ICD-10-CM

## 2019-12-17 DIAGNOSIS — Z8249 Family history of ischemic heart disease and other diseases of the circulatory system: Secondary | ICD-10-CM | POA: Diagnosis not present

## 2019-12-17 DIAGNOSIS — C778 Secondary and unspecified malignant neoplasm of lymph nodes of multiple regions: Secondary | ICD-10-CM | POA: Diagnosis not present

## 2019-12-17 DIAGNOSIS — R5383 Other fatigue: Secondary | ICD-10-CM | POA: Insufficient documentation

## 2019-12-17 DIAGNOSIS — N39498 Other specified urinary incontinence: Secondary | ICD-10-CM | POA: Insufficient documentation

## 2019-12-17 DIAGNOSIS — I429 Cardiomyopathy, unspecified: Secondary | ICD-10-CM | POA: Insufficient documentation

## 2019-12-17 DIAGNOSIS — Z7952 Long term (current) use of systemic steroids: Secondary | ICD-10-CM | POA: Insufficient documentation

## 2019-12-17 DIAGNOSIS — Z7901 Long term (current) use of anticoagulants: Secondary | ICD-10-CM | POA: Insufficient documentation

## 2019-12-17 DIAGNOSIS — C7951 Secondary malignant neoplasm of bone: Secondary | ICD-10-CM | POA: Diagnosis not present

## 2019-12-17 DIAGNOSIS — E860 Dehydration: Secondary | ICD-10-CM

## 2019-12-17 DIAGNOSIS — Z8546 Personal history of malignant neoplasm of prostate: Secondary | ICD-10-CM | POA: Insufficient documentation

## 2019-12-17 DIAGNOSIS — Z7983 Long term (current) use of bisphosphonates: Secondary | ICD-10-CM

## 2019-12-17 DIAGNOSIS — I4891 Unspecified atrial fibrillation: Secondary | ICD-10-CM | POA: Diagnosis not present

## 2019-12-17 LAB — COMPREHENSIVE METABOLIC PANEL
ALT: 11 U/L (ref 0–44)
AST: 17 U/L (ref 15–41)
Albumin: 4.1 g/dL (ref 3.5–5.0)
Alkaline Phosphatase: 53 U/L (ref 38–126)
Anion gap: 8 (ref 5–15)
BUN: 34 mg/dL — ABNORMAL HIGH (ref 8–23)
CO2: 28 mmol/L (ref 22–32)
Calcium: 9.7 mg/dL (ref 8.9–10.3)
Chloride: 103 mmol/L (ref 98–111)
Creatinine, Ser: 1.05 mg/dL (ref 0.61–1.24)
GFR calc Af Amer: >60 mL/min (ref >60)
GFR calc non Af Amer: >60 mL/min (ref >60)
Glucose, Bld: 129 mg/dL — ABNORMAL HIGH (ref 70–99)
Potassium: 3.8 mmol/L (ref 3.5–5.1)
Sodium: 139 mmol/L (ref 135–145)
Total Bilirubin: 1.3 mg/dL — ABNORMAL HIGH (ref 0.3–1.2)
Total Protein: 6.8 g/dL (ref 6.5–8.1)

## 2019-12-17 LAB — CBC WITH DIFFERENTIAL/PLATELET
Abs Immature Granulocytes: 0.01 10*3/uL (ref 0.00–0.07)
Basophils Absolute: 0 10*3/uL (ref 0.0–0.1)
Basophils Relative: 0 %
Eosinophils Absolute: 0.1 10*3/uL (ref 0.0–0.5)
Eosinophils Relative: 2 %
HCT: 40.6 % (ref 39.0–52.0)
Hemoglobin: 13.1 g/dL (ref 13.0–17.0)
Immature Granulocytes: 0 %
Lymphocytes Relative: 18 %
Lymphs Abs: 0.9 10*3/uL (ref 0.7–4.0)
MCH: 30.4 pg (ref 26.0–34.0)
MCHC: 32.3 g/dL (ref 30.0–36.0)
MCV: 94.2 fL (ref 80.0–100.0)
Monocytes Absolute: 0.4 10*3/uL (ref 0.1–1.0)
Monocytes Relative: 7 %
Neutro Abs: 3.9 10*3/uL (ref 1.7–7.7)
Neutrophils Relative %: 73 %
Platelets: 125 10*3/uL — ABNORMAL LOW (ref 150–400)
RBC: 4.31 MIL/uL (ref 4.22–5.81)
RDW: 14.1 % (ref 11.5–15.5)
WBC: 5.4 10*3/uL (ref 4.0–10.5)
nRBC: 0 % (ref 0.0–0.2)

## 2019-12-17 LAB — PSA: Prostatic Specific Antigen: 12.61 ng/mL — ABNORMAL HIGH (ref 0.00–4.00)

## 2019-12-17 MED ORDER — DENOSUMAB 120 MG/1.7ML ~~LOC~~ SOLN
120.0000 mg | SUBCUTANEOUS | Status: DC
  Administered 2019-12-17: 120 mg via SUBCUTANEOUS
  Filled 2019-12-17: qty 1.7

## 2019-12-17 NOTE — Progress Notes (Signed)
Patient stated that he would like to know if he would be put asleep for his biopsy on his lymph nodes. Patient would like to know if he should take Vitamin C or D or both.

## 2019-12-18 NOTE — Progress Notes (Signed)
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 He had biochemical recurrence in 2017 and was treated with IM RT. He again began to have a rising PSA in 2010 and underwent orchiectomy in 2010.his PSA was being monitored closely PSA a year ago was 0.5, 5 months ago was 3.9 in 2 months ago was 7.8.he underwent CT abdomen and pelvis in May 2018which showed abnormal periaortic and right common iliac lymph nodes. Index aortocaval lymph node 1.4 cm which was new as compared to February 2014 and suspicious for malignancy. Bone scan did not reveal any evidence of bony metastatic disease. He has been referred to [place] for systemic therapy options for CRPC  Patient lives with his wife and is independent of his ADL's. He has A fib for which he is on coumadin. Also has chronic urinary incontinence from prior prostate surgery and has AUS. Reports 50 pound intentional weight loss since October 2017  Repeat PSA in July 2018 was 13.3 indicating PSA doubling time of 3 months  Docetaxel chemo initiated for CRPC with ln mets and rapid doubling PSA in august 2018.Patient completed 5 cycles of docetaxel on 09/27/2017. Cycle #6 was not given due to progressive fatigue and cytopenias as well as worsening neuropathy. Cycle 5 of docetaxel was dose reduced to 60 mg/m square  In April 2019 patient noted to have rising PSA and increase in the size of retroperitoneal LN. Patient  therefore started second line zytiga   Interval history-neuropathy has remained stable.  He does not want to make any changes to his medications.  He has mild stable chronic fatigue which is unchanged.  ECOG PS- 1 Pain scale- 0 Opioid associated constipation- no  Review of systems- Review of Systems  Constitutional: Positive for malaise/fatigue. Negative for chills, fever and weight loss.  HENT: Negative for congestion, ear discharge and nosebleeds.   Eyes: Negative for blurred vision.  Respiratory: Negative for cough, hemoptysis, sputum production, shortness of breath and wheezing.   Cardiovascular: Negative for chest pain, palpitations, orthopnea and claudication.  Gastrointestinal: Negative for abdominal pain, blood in stool, constipation, diarrhea, heartburn, melena, nausea and vomiting.  Genitourinary: Negative for dysuria, flank pain, frequency, hematuria and urgency.  Musculoskeletal: Negative for back pain, joint pain and myalgias.  Skin: Negative for rash.  Neurological: Positive for sensory change (Peripheral neuropathy). Negative for dizziness, tingling, focal weakness, seizures, weakness and headaches.  Endo/Heme/Allergies: Does not bruise/bleed easily.  Psychiatric/Behavioral: Negative for depression and suicidal ideas. The patient does not have insomnia.      Allergies  Allergen Reactions  . Latex Rash    Sensitivity, not allergy.     Past Medical History:  Diagnosis Date  . Arthritis, degenerative 10/30/2015  . Atrial fibrillation ([place]) 10/30/2015  . Bladder spasm 06/12/2013  . Carcinoma of prostate ([place]) 10/30/2015  . Cardiomyopathy ([place]) 09/10/2014   Overview:  EF 35%,4/15   . Cerebrovascular accident (CVA) ([place]) 10/30/2015   Overview:  Left embolic [id number]   . Difficult or painful urination 11/21/2013  . Dysrhythmia   .  Gout 10/30/2015  . History of kidney stones   . Malignant neoplasm of prostate ([place]) 09/05/2012  . Sinoatrial node dysfunction ([facility])  10/30/2015   Overview:  DDD pacemaker, 1996      Past Surgical History:  Procedure Laterality Date  . castration  10/2009  . CATARACT EXTRACTION W/ INTRAOCULAR LENS  IMPLANT, BILATERAL Bilateral 2016   right eye done then the left one done 3 months later in 2016  . COLONOSCOPY WITH PROPOFOL N/A 11/21/2019   Procedure: COLONOSCOPY WITH PROPOFOL;  Surgeon: Robert Bellow, MD;  Location: [facility] ENDOSCOPY;  Service: Endoscopy;  Laterality: N/A;  . INGUINAL HERNIA REPAIR Left   . INSERT / REPLACE / REMOVE PACEMAKER    . IR FLUORO GUIDE PORT INSERTION RIGHT  07/01/2017  . JOINT REPLACEMENT Bilateral 1980   hips  . left knee replacement  2014  . PACEMAKER INSERTION    . PROSTATECTOMY    . REPLACEMENT TOTAL HIP W/  RESURFACING IMPLANTS  1997   bilateral-   . REPLACEMENT TOTAL KNEE Bilateral   . testes removal     per pt   . TONSILLECTOMY AND ADENOIDECTOMY    . TRANSURETHRAL RESECTION OF BLADDER TUMOR N/A 12/31/2015   Procedure: BLADDER BIOPSY;  Surgeon: Nickie Retort, MD;  Location: [facility] ORS;  Service: Urology;  Laterality: N/A;  . Urinary incontinence valve     AMS    Social History   Socioeconomic History  . Marital status: Married    Spouse name: Not on file  . Number of children: Not on file  . Years of education: Not on file  . Highest education level: Not on file  Occupational History  . Not on file  Tobacco Use  . Smoking status: Never Smoker  . Smokeless tobacco: Never Used  Substance and Sexual Activity  . Alcohol use: No    Alcohol/week: 0.0 standard drinks  . Drug use: No  . Sexual activity: Never  Other Topics Concern  . Not on file  Social History Narrative  . Not on file   Social Determinants of Health   Financial Resource Strain:   . Difficulty of Paying Living Expenses: Not on file  Food Insecurity:   . Worried About Charity fundraiser in the Last Year: Not on file  . Ran Out of Food in the Last Year: Not on file  Transportation Needs:   .  Lack of Transportation (Medical): Not on file  . Lack of Transportation (Non-Medical): Not on file  Physical Activity:   . Days of Exercise per Week: Not on file  . Minutes of Exercise per Session: Not on file  Stress:   . Feeling of Stress : Not on file  Social Connections:   . Frequency of Communication with Friends and Family: Not on file  . Frequency of Social Gatherings with Friends and Family: Not on file  . Attends Religious Services: Not on file  . Active Member of Clubs or Organizations: Not on file  . Attends Archivist Meetings: Not on file  . Marital Status: Not on file  Intimate Partner Violence:   . Fear of Current or Ex-Partner: Not on file  . Emotionally Abused: Not on file  . Physically Abused: Not on file  . Sexually Abused: Not on file    Family History  Problem Relation Age of Onset  . Cancer Mother   . Arthritis Mother   . Heart attack Father   . Arthritis Father   .  Transient ischemic attack Brother   . Prostate cancer Neg Hx   . Bladder Cancer Neg Hx      Current Outpatient Medications:  .  Abiraterone Acetate (ZYTIGA PO), Take 4 capsules by mouth daily. , Disp: , Rfl:  .  acetaminophen (TYLENOL) 325 MG tablet, Take 650 mg by mouth 2 (two) times daily., Disp: , Rfl:  .  allopurinol (ZYLOPRIM) 300 MG tablet, Take by mouth daily. In am., Disp: , Rfl:  .  amoxicillin (AMOXIL) 500 MG capsule, Take 1 capsule by mouth 3 (three) times daily., Disp: , Rfl:  .  bisacodyl (DULCOLAX) 5 MG EC tablet, Take 5 mg by mouth 2 (two) times a day., Disp: , Rfl:  .  calcium carbonate (CALCIUM 600) 600 MG TABS tablet, Take 600 mg by mouth 2 (two) times daily with a meal., Disp: , Rfl:  .  cyanocobalamin (,VITAMIN B-12,) 1000 MCG/ML injection, Inject 1,000 mcg into the muscle every 30 (thirty) days. , Disp: , Rfl:  .  hydrochlorothiazide (HYDRODIURIL) 25 MG tablet, Take 25 mg by mouth. In am, Disp: , Rfl:  .  lidocaine-prilocaine (EMLA) cream, , Disp: , Rfl:  .   phentermine 15 MG capsule, Take 15 mg by mouth every morning., Disp: , Rfl:  .  potassium chloride (K-DUR) 10 MEQ tablet, TAKE 2 TABLETS BY MOUTH ONCE DAILY, Disp: 60 tablet, Rfl: 1 .  predniSONE (DELTASONE) 5 MG tablet, TAKE 1 TABLET (5 MG TOTAL) BY MOUTH 2 TIMES DAILY WITH A MEAL., Disp: 60 tablet, Rfl: 5 .  simvastatin (ZOCOR) 40 MG tablet, Take 40 mg by mouth daily at 6 PM. , Disp: , Rfl:  .  warfarin (COUMADIN) 2 MG tablet, Take 2 mg by mouth. , Disp: , Rfl:  .  warfarin (COUMADIN) 5 MG tablet, , Disp: , Rfl:  No current facility-administered medications for this visit.  Facility-Administered Medications Ordered in Other Visits:  .  denosumab (XGEVA) injection 120 mg, 120 mg, Subcutaneous, Q28 days, Sindy Guadeloupe, MD, 120 mg at 09/07/19 1144 .  denosumab (XGEVA) injection 120 mg, 120 mg, Subcutaneous, Q28 days, Sindy Guadeloupe, MD, 120 mg at 11/15/19 1158  Physical exam:  Vitals:   12/17/19 1506  BP: 120/80  Pulse: 69  Temp: (!) 97.4 F (36.3 C)  SpO2: 98%  Weight: 277 lb (125.6 kg)  Height: 6' (1.829 m)   Physical Exam Constitutional:      General: He is not in acute distress. HENT:     Head: Normocephalic and atraumatic.  Eyes:     Pupils: Pupils are equal, round, and reactive to light.  Cardiovascular:     Rate and Rhythm: Normal rate and regular rhythm.     Heart sounds: Normal heart sounds.  Pulmonary:     Effort: Pulmonary effort is normal.     Breath sounds: Normal breath sounds.  Abdominal:     General: Bowel sounds are normal.     Palpations: Abdomen is soft.  Musculoskeletal:     Cervical back: Normal range of motion.  Skin:    General: Skin is warm and dry.  Neurological:     Mental Status: He is alert and oriented to person, place, and time.      CMP Latest Ref Rng & Units 12/17/2019  Glucose 70 - 99 mg/dL 129(H)  BUN 8 - 23 mg/dL 34(H)  Creatinine 0.61 - 1.24 mg/dL 1.05  Sodium 135 - 145 mmol/L 139  Potassium 3.5 - 5.1 mmol/L 3.8  Chloride  98 -  111 mmol/L 103  CO2 22 - 32 mmol/L 28  Calcium 8.9 - 10.3 mg/dL 9.7  Total Protein 6.5 - 8.1 g/dL 6.8  Total Bilirubin 0.3 - 1.2 mg/dL 1.3(H)  Alkaline Phos 38 - 126 U/L 53  AST 15 - 41 U/L 17  ALT 0 - 44 U/L 11   CBC Latest Ref Rng & Units 12/17/2019  WBC 4.0 - 10.5 K/uL 5.4  Hemoglobin 13.0 - 17.0 g/dL 13.1  Hematocrit 39.0 - 52.0 % 40.6  Platelets 150 - 400 K/uL 125(L)    Assessment and plan- Patient is a 74 y.o. male  with history of castrate resistant metastatic prostate cancer with metastases to the lymph nodes and scapula.  He is currently on Zytiga and this is a routine follow-up visit of prostate cancer  Patient has had slowly progressive prostate cancer.  His PSA has been slowly trending up from 2021-03-28 8.1 in July 20 20-10.6 in December 2020 and now up to 12.61.  His most recent CT scan in November 2020 showed mild enlargement of retroperitoneal lymph nodes and slight progression of sclerotic bone metastases involving the T4 vertebral body and left scapula.  He continues to have low-volume disease.  He has already had disease progression on docetaxel.  I would therefore like to keep him on Zytiga at this time unless there is a significant progression seen on the CT scan and or significant rise in his PSA.  We also discussed his case at tumor board and the lymph nodes are not amenable to biopsy to look for any targeted mutations.  He will continue with Zytiga and prednisone at this time.  Patient is getting monthly Xgeva for his bone metastases and will get it today 1 month and 2 months and I will see him back in 2 months with CBC with differential CMP and PSA  I will plan to get repeat scans sometime in March or April 2021.  I have personally reviewed his labs including CBC with differential CMP and PSA today.  Patient at baseline has a mildly elevated bilirubin with normal LFTs which we will continue to monitor   Visit Diagnosis 1. Malignant neoplasm of prostate ([place])   2.  High risk medication use   3. Long term (current) use of bisphosphonates      Dr. Randa Evens, MD, MPH [facility] [phone] 12/18/2019 8:40 AM

## 2019-12-21 ENCOUNTER — Telehealth: Payer: Self-pay | Admitting: *Deleted

## 2019-12-21 NOTE — Telephone Encounter (Signed)
Pt wants to push out his appts from 2/4 to 2/16-he has promised to take care of a good friend that is having surgery and will need the appt moved from 2/4 to 2/16.  Also he has the next appt 3/4 and because the feb. appt is being r/s then the march one will need to be scheduled also. Pt aware and I will ask heather to change it and give him a call with changes.. pt agreeable to the plan

## 2019-12-26 ENCOUNTER — Telehealth: Payer: Self-pay | Admitting: *Deleted

## 2019-12-26 ENCOUNTER — Other Ambulatory Visit: Payer: Self-pay | Admitting: *Deleted

## 2019-12-26 MED ORDER — POTASSIUM CHLORIDE CRYS ER 10 MEQ PO TBCR: 20.0000 meq | EXTENDED_RELEASE_TABLET | Freq: Every day | ORAL | 1 refills | Status: DC

## 2019-12-26 MED FILL — predniSONE 5 MG TABS: 5 | 30 days supply | Qty: 60 | Fill #3

## 2019-12-26 NOTE — Telephone Encounter (Signed)
Pt called requesting to have refill of potassium-kdur. Sent message to dr Janese Banks and she is agreeable and I sent over refill. I called pt and left messsage on voicemail to  let him know that a refill has been sent electronically to [facility]

## 2020-01-01 ENCOUNTER — Other Ambulatory Visit: Payer: Self-pay | Admitting: Oncology

## 2020-01-01 DIAGNOSIS — C61 Malignant neoplasm of prostate: Secondary | ICD-10-CM

## 2020-01-07 MED FILL — ABIRATERONE ACETATE 250 MG: 250 | 30 days supply | Qty: 120 | Fill #0

## 2020-01-17 ENCOUNTER — Other Ambulatory Visit: Payer: PPO

## 2020-01-17 ENCOUNTER — Ambulatory Visit: Payer: PPO

## 2020-01-21 MED FILL — predniSONE 5 MG TABS: 5 | 30 days supply | Qty: 60 | Fill #4

## 2020-01-22 ENCOUNTER — Telehealth: Payer: Self-pay | Admitting: *Deleted

## 2020-01-22 ENCOUNTER — Other Ambulatory Visit: Payer: Self-pay | Admitting: Oncology

## 2020-01-22 NOTE — Telephone Encounter (Signed)
Pt called to say that [place] to stay [place] and his appts need to be moved forward 1 week and that will impact the march visit too. Both appt fro feb and march have been made and pt aware of the new dates

## 2020-01-29 ENCOUNTER — Ambulatory Visit: Payer: PPO

## 2020-01-29 ENCOUNTER — Other Ambulatory Visit: Payer: PPO

## 2020-02-02 ENCOUNTER — Other Ambulatory Visit: Payer: Self-pay | Admitting: Oncology

## 2020-02-04 ENCOUNTER — Other Ambulatory Visit: Payer: Self-pay

## 2020-02-05 ENCOUNTER — Inpatient Hospital Stay: Payer: PPO | Attending: Oncology

## 2020-02-05 ENCOUNTER — Inpatient Hospital Stay: Payer: PPO

## 2020-02-05 ENCOUNTER — Other Ambulatory Visit: Payer: Self-pay

## 2020-02-05 DIAGNOSIS — Z95828 Presence of other vascular implants and grafts: Secondary | ICD-10-CM

## 2020-02-05 DIAGNOSIS — E860 Dehydration: Secondary | ICD-10-CM

## 2020-02-05 DIAGNOSIS — I482 Chronic atrial fibrillation, unspecified: Secondary | ICD-10-CM | POA: Diagnosis not present

## 2020-02-05 DIAGNOSIS — C7951 Secondary malignant neoplasm of bone: Secondary | ICD-10-CM | POA: Diagnosis not present

## 2020-02-05 DIAGNOSIS — C61 Malignant neoplasm of prostate: Secondary | ICD-10-CM | POA: Insufficient documentation

## 2020-02-05 LAB — CBC WITH DIFFERENTIAL/PLATELET
Abs Immature Granulocytes: 0.01 10*3/uL (ref 0.00–0.07)
Basophils Absolute: 0 10*3/uL (ref 0.0–0.1)
Basophils Relative: 0 %
Eosinophils Absolute: 0.1 10*3/uL (ref 0.0–0.5)
Eosinophils Relative: 3 %
HCT: 35.4 % — ABNORMAL LOW (ref 39.0–52.0)
Hemoglobin: 11.5 g/dL — ABNORMAL LOW (ref 13.0–17.0)
Immature Granulocytes: 0 %
Lymphocytes Relative: 20 %
Lymphs Abs: 1 10*3/uL (ref 0.7–4.0)
MCH: 30.5 pg (ref 26.0–34.0)
MCHC: 32.5 g/dL (ref 30.0–36.0)
MCV: 93.9 fL (ref 80.0–100.0)
Monocytes Absolute: 0.4 10*3/uL (ref 0.1–1.0)
Monocytes Relative: 8 %
Neutro Abs: 3.6 10*3/uL (ref 1.7–7.7)
Neutrophils Relative %: 69 %
Platelets: 122 10*3/uL — ABNORMAL LOW (ref 150–400)
RBC: 3.77 MIL/uL — ABNORMAL LOW (ref 4.22–5.81)
RDW: 14.7 % (ref 11.5–15.5)
WBC: 5.1 10*3/uL (ref 4.0–10.5)
nRBC: 0 % (ref 0.0–0.2)

## 2020-02-05 LAB — COMPREHENSIVE METABOLIC PANEL
ALT: 12 U/L (ref 0–44)
AST: 21 U/L (ref 15–41)
Albumin: 4.1 g/dL (ref 3.5–5.0)
Alkaline Phosphatase: 52 U/L (ref 38–126)
Anion gap: 11 (ref 5–15)
BUN: 34 mg/dL — ABNORMAL HIGH (ref 8–23)
CO2: 25 mmol/L (ref 22–32)
Calcium: 9.7 mg/dL (ref 8.9–10.3)
Chloride: 102 mmol/L (ref 98–111)
Creatinine, Ser: 1.06 mg/dL (ref 0.61–1.24)
GFR calc Af Amer: >60 mL/min (ref >60)
GFR calc non Af Amer: >60 mL/min (ref >60)
Glucose, Bld: 109 mg/dL — ABNORMAL HIGH (ref 70–99)
Potassium: 3.4 mmol/L — ABNORMAL LOW (ref 3.5–5.1)
Sodium: 138 mmol/L (ref 135–145)
Total Bilirubin: 1.1 mg/dL (ref 0.3–1.2)
Total Protein: 6.8 g/dL (ref 6.5–8.1)

## 2020-02-05 LAB — PSA: Prostatic Specific Antigen: 17.91 ng/mL — ABNORMAL HIGH (ref 0.00–4.00)

## 2020-02-05 MED ORDER — HEPARIN SOD (PORK) LOCK FLUSH 100 UNIT/ML IV SOLN
500.0000 [IU] | Freq: Once | INTRAVENOUS | Status: DC
  Filled 2020-02-05: qty 5

## 2020-02-05 MED ORDER — DENOSUMAB 120 MG/1.7ML ~~LOC~~ SOLN
120.0000 mg | Freq: Once | SUBCUTANEOUS | Status: AC
  Administered 2020-02-05: 120 mg via SUBCUTANEOUS
  Filled 2020-02-05: qty 1.7

## 2020-02-05 MED ORDER — SODIUM CHLORIDE 0.9% FLUSH
10.0000 mL | Freq: Once | INTRAVENOUS | Status: AC
  Administered 2020-02-05: 11:00:00 10 mL via INTRAVENOUS
  Filled 2020-02-05: qty 10

## 2020-02-11 MED FILL — ABIRATERONE ACETATE 250 MG: 250 | 30 days supply | Qty: 120 | Fill #1

## 2020-02-14 ENCOUNTER — Ambulatory Visit: Payer: PPO

## 2020-02-14 ENCOUNTER — Ambulatory Visit: Payer: PPO | Admitting: Oncology

## 2020-02-14 ENCOUNTER — Other Ambulatory Visit: Payer: PPO

## 2020-02-15 DIAGNOSIS — I6300 Cerebral infarction due to thrombosis of unspecified precerebral artery: Secondary | ICD-10-CM | POA: Diagnosis not present

## 2020-02-15 DIAGNOSIS — I420 Dilated cardiomyopathy: Secondary | ICD-10-CM | POA: Diagnosis not present

## 2020-02-15 DIAGNOSIS — D696 Thrombocytopenia, unspecified: Secondary | ICD-10-CM | POA: Diagnosis not present

## 2020-02-15 DIAGNOSIS — I480 Paroxysmal atrial fibrillation: Secondary | ICD-10-CM | POA: Diagnosis not present

## 2020-02-15 DIAGNOSIS — I495 Sick sinus syndrome: Secondary | ICD-10-CM | POA: Diagnosis not present

## 2020-02-15 DIAGNOSIS — M5116 Intervertebral disc disorders with radiculopathy, lumbar region: Secondary | ICD-10-CM | POA: Diagnosis not present

## 2020-02-15 DIAGNOSIS — C61 Malignant neoplasm of prostate: Secondary | ICD-10-CM | POA: Diagnosis not present

## 2020-02-15 DIAGNOSIS — T451X5A Adverse effect of antineoplastic and immunosuppressive drugs, initial encounter: Secondary | ICD-10-CM | POA: Diagnosis not present

## 2020-02-15 DIAGNOSIS — G620 Drug-induced polyneuropathy: Secondary | ICD-10-CM | POA: Diagnosis not present

## 2020-02-18 MED FILL — predniSONE 5 MG TABS: 5 | 30 days supply | Qty: 60 | Fill #5

## 2020-02-26 ENCOUNTER — Other Ambulatory Visit: Payer: PPO

## 2020-02-26 ENCOUNTER — Ambulatory Visit: Payer: PPO | Admitting: Oncology

## 2020-02-26 ENCOUNTER — Ambulatory Visit: Payer: PPO

## 2020-02-29 ENCOUNTER — Other Ambulatory Visit: Payer: PPO

## 2020-02-29 ENCOUNTER — Ambulatory Visit: Payer: PPO | Admitting: Oncology

## 2020-02-29 ENCOUNTER — Ambulatory Visit: Payer: PPO

## 2020-03-03 ENCOUNTER — Other Ambulatory Visit: Payer: Self-pay | Admitting: Oncology

## 2020-03-07 ENCOUNTER — Inpatient Hospital Stay: Payer: PPO

## 2020-03-07 ENCOUNTER — Inpatient Hospital Stay (HOSPITAL_BASED_OUTPATIENT_CLINIC_OR_DEPARTMENT_OTHER): Payer: PPO | Admitting: Oncology

## 2020-03-07 ENCOUNTER — Encounter: Payer: Self-pay | Admitting: Oncology

## 2020-03-07 ENCOUNTER — Inpatient Hospital Stay: Payer: PPO | Attending: Oncology

## 2020-03-07 VITALS — BP 130/68 | HR 78 | Temp 98.1°F | Ht 72.0 in | Wt 291.0 lb

## 2020-03-07 DIAGNOSIS — C61 Malignant neoplasm of prostate: Secondary | ICD-10-CM

## 2020-03-07 DIAGNOSIS — Z9079 Acquired absence of other genital organ(s): Secondary | ICD-10-CM | POA: Diagnosis not present

## 2020-03-07 DIAGNOSIS — Z8673 Personal history of transient ischemic attack (TIA), and cerebral infarction without residual deficits: Secondary | ICD-10-CM | POA: Diagnosis not present

## 2020-03-07 DIAGNOSIS — Z79899 Other long term (current) drug therapy: Secondary | ICD-10-CM

## 2020-03-07 DIAGNOSIS — Z8261 Family history of arthritis: Secondary | ICD-10-CM | POA: Diagnosis not present

## 2020-03-07 DIAGNOSIS — C779 Secondary and unspecified malignant neoplasm of lymph node, unspecified: Secondary | ICD-10-CM | POA: Diagnosis not present

## 2020-03-07 DIAGNOSIS — I4891 Unspecified atrial fibrillation: Secondary | ICD-10-CM | POA: Insufficient documentation

## 2020-03-07 DIAGNOSIS — Z7901 Long term (current) use of anticoagulants: Secondary | ICD-10-CM | POA: Diagnosis not present

## 2020-03-07 DIAGNOSIS — Z7952 Long term (current) use of systemic steroids: Secondary | ICD-10-CM | POA: Diagnosis not present

## 2020-03-07 DIAGNOSIS — Z7983 Long term (current) use of bisphosphonates: Secondary | ICD-10-CM | POA: Diagnosis not present

## 2020-03-07 DIAGNOSIS — E860 Dehydration: Secondary | ICD-10-CM

## 2020-03-07 DIAGNOSIS — Z8249 Family history of ischemic heart disease and other diseases of the circulatory system: Secondary | ICD-10-CM | POA: Diagnosis not present

## 2020-03-07 DIAGNOSIS — C7951 Secondary malignant neoplasm of bone: Secondary | ICD-10-CM | POA: Insufficient documentation

## 2020-03-07 DIAGNOSIS — Z7189 Other specified counseling: Secondary | ICD-10-CM

## 2020-03-07 LAB — CBC WITH DIFFERENTIAL/PLATELET
Abs Immature Granulocytes: 0.02 10*3/uL (ref 0.00–0.07)
Basophils Absolute: 0 10*3/uL (ref 0.0–0.1)
Basophils Relative: 0 %
Eosinophils Absolute: 0.1 10*3/uL (ref 0.0–0.5)
Eosinophils Relative: 2 %
HCT: 36.3 % — ABNORMAL LOW (ref 39.0–52.0)
Hemoglobin: 12 g/dL — ABNORMAL LOW (ref 13.0–17.0)
Immature Granulocytes: 1 %
Lymphocytes Relative: 21 %
Lymphs Abs: 0.9 10*3/uL (ref 0.7–4.0)
MCH: 30.6 pg (ref 26.0–34.0)
MCHC: 33.1 g/dL (ref 30.0–36.0)
MCV: 92.6 fL (ref 80.0–100.0)
Monocytes Absolute: 0.4 10*3/uL (ref 0.1–1.0)
Monocytes Relative: 10 %
Neutro Abs: 2.7 10*3/uL (ref 1.7–7.7)
Neutrophils Relative %: 66 %
Platelets: 132 10*3/uL — ABNORMAL LOW (ref 150–400)
RBC: 3.92 MIL/uL — ABNORMAL LOW (ref 4.22–5.81)
RDW: 15.3 % (ref 11.5–15.5)
WBC: 4.1 10*3/uL (ref 4.0–10.5)
nRBC: 0 % (ref 0.0–0.2)

## 2020-03-07 LAB — COMPREHENSIVE METABOLIC PANEL
ALT: 15 U/L (ref 0–44)
AST: 24 U/L (ref 15–41)
Albumin: 4.2 g/dL (ref 3.5–5.0)
Alkaline Phosphatase: 55 U/L (ref 38–126)
Anion gap: 11 (ref 5–15)
BUN: 34 mg/dL — ABNORMAL HIGH (ref 8–23)
CO2: 25 mmol/L (ref 22–32)
Calcium: 10.1 mg/dL (ref 8.9–10.3)
Chloride: 103 mmol/L (ref 98–111)
Creatinine, Ser: 1.23 mg/dL (ref 0.61–1.24)
GFR calc Af Amer: >60 mL/min (ref >60)
GFR calc non Af Amer: 59 mL/min — ABNORMAL LOW (ref >60)
Glucose, Bld: 100 mg/dL — ABNORMAL HIGH (ref 70–99)
Potassium: 4.2 mmol/L (ref 3.5–5.1)
Sodium: 139 mmol/L (ref 135–145)
Total Bilirubin: 1.5 mg/dL — ABNORMAL HIGH (ref 0.3–1.2)
Total Protein: 6.6 g/dL (ref 6.5–8.1)

## 2020-03-07 LAB — PSA: Prostatic Specific Antigen: 23.91 ng/mL — ABNORMAL HIGH (ref 0.00–4.00)

## 2020-03-07 MED ORDER — DENOSUMAB 120 MG/1.7ML ~~LOC~~ SOLN
120.0000 mg | Freq: Once | SUBCUTANEOUS | Status: AC
  Administered 2020-03-07: 120 mg via SUBCUTANEOUS
  Filled 2020-03-07: qty 1.7

## 2020-03-11 NOTE — Progress Notes (Signed)
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 . He again began to have a rising PSA in 2010 and underwent orchiectomy in 2010.He had biochemical recurrence in 2017 and was treated with IMRT  He underwent CT abdomen and pelvis in May 2018which showed abnormal periaortic and right common iliac lymph nodes. Index aortocaval lymph node 1.4 cm which was new as compared to February 2014 and suspicious for malignancy. Bone scan did not reveal any evidence of bony metastatic disease.  PSA went up from 3.9 in January 20 18-7.8 in April 2018.  13.3 in July 2018 when docetaxel was initiated.  Doubling time at that time was about 3 months  Docetaxel chemo initiated for CRPC with ln mets and rapid doubling PSA in august 2018.Patient completed 5 cycles of docetaxel on 09/27/2017. Cycle #6 was not given due to progressive fatigue and cytopenias as well as worsening neuropathy. Cycle 5 of docetaxel was dose reduced to 60 mg/m square.  Patient never had normalization of his PSA following docetaxel and the lowest PSA after docetaxel was 5.64 in December 2018  In April 2019 patient noted to have rising PSA to 19.02 and increase in the size of retroperitoneal LN. Patient therefore started second line zytiga.   Patient has remained on Zytiga since April 2019.  The lowest PSA value on Zytiga was 6.1 in August 2020 and since then there has been a small but steady increase in his PSA.  Most recent imaging in November 2020 showed increase in the size of retroperitoneal lymph nodes from 0.7 cm to 1.1 cm.  New periaortic lymph node 1.2 cm.  Slight progression of sclerotic bone metastases involving the left scapula as well as T4 and T7.  Most recent PSA on 03/07/2020 was 23.9.  Given the subtle progression Zytiga was not changed at that time.  Patient has not had a repeat tissue biopsy as none of the sites of disease noted on the CT scan were amenable for biopsy   Interval history-patient currently feels well.  His appetite and weight have remained stable.  Denies any new aches or pains anywhere.  ECOG PS- 1 Pain scale- 0   Review of systems- Review of Systems  Constitutional: Positive for malaise/fatigue. Negative for chills, fever and weight loss.  HENT: Negative for congestion, ear discharge and nosebleeds.   Eyes: Negative for blurred vision.  Respiratory: Negative for cough, hemoptysis, sputum production, shortness of breath and wheezing.   Cardiovascular: Negative for chest pain, palpitations, orthopnea and claudication.  Gastrointestinal: Negative for abdominal pain, blood in stool, constipation, diarrhea, heartburn, melena, nausea and vomiting.  Genitourinary: Negative for dysuria, flank pain, frequency, hematuria and urgency.  Musculoskeletal: Negative for back pain, joint pain and myalgias.  Skin: Negative for rash.  Neurological: Negative for dizziness, tingling, focal weakness, seizures, weakness and headaches.  Endo/Heme/Allergies: Does not bruise/bleed easily.  Psychiatric/Behavioral: Negative for depression and suicidal ideas. The patient does not have insomnia.       Allergies  Allergen Reactions  . Latex Rash  Sensitivity, not allergy.     Past Medical History:  Diagnosis Date  . Arthritis, degenerative 10/30/2015  . Atrial fibrillation ([place]) 10/30/2015  . Bladder spasm 06/12/2013  . Carcinoma of prostate ([place]) 10/30/2015  . Cardiomyopathy ([place])  09/10/2014   Overview:  EF 35%,4/15   . Cerebrovascular accident (CVA) ([place]) 10/30/2015   Overview:  Left embolic [id number]   . Difficult or painful urination 11/21/2013  . Dysrhythmia   . Gout 10/30/2015  . History of kidney stones   . Malignant neoplasm of prostate ([place]) 09/05/2012  . Sinoatrial node dysfunction ([facility]) 10/30/2015   Overview:  DDD pacemaker, 1996      Past Surgical History:  Procedure Laterality Date  . castration  10/2009  . CATARACT EXTRACTION W/ INTRAOCULAR LENS  IMPLANT, BILATERAL Bilateral 2016   right eye done then the left one done 3 months later in 2016  . COLONOSCOPY WITH PROPOFOL N/A 11/21/2019   Procedure: COLONOSCOPY WITH PROPOFOL;  Surgeon: Robert Bellow, MD;  Location: [facility] ENDOSCOPY;  Service: Endoscopy;  Laterality: N/A;  . INGUINAL HERNIA REPAIR Left   . INSERT / REPLACE / REMOVE PACEMAKER    . IR FLUORO GUIDE PORT INSERTION RIGHT  07/01/2017  . JOINT REPLACEMENT Bilateral 1980   hips  . left knee replacement  2014  . PACEMAKER INSERTION    . PROSTATECTOMY    . REPLACEMENT TOTAL HIP W/  RESURFACING IMPLANTS  1997   bilateral-   . REPLACEMENT TOTAL KNEE Bilateral   . testes removal     per pt   . TONSILLECTOMY AND ADENOIDECTOMY    . TRANSURETHRAL RESECTION OF BLADDER TUMOR N/A 12/31/2015   Procedure: BLADDER BIOPSY;  Surgeon: Nickie Retort, MD;  Location: [facility] ORS;  Service: Urology;  Laterality: N/A;  . Urinary incontinence valve     AMS    Social History   Socioeconomic History  . Marital status: Married    Spouse name: Not on file  . Number of children: Not on file  . Years of education: Not on file  . Highest education level: Not on file  Occupational History  . Not on file  Tobacco Use  . Smoking status: Never Smoker  . Smokeless tobacco: Never Used  Substance and Sexual Activity  . Alcohol use: No    Alcohol/week: 0.0 standard drinks  . Drug use: No  . Sexual activity: Never  Other Topics Concern  . Not on file    Social History Narrative  . Not on file   Social Determinants of Health   Financial Resource Strain:   . Difficulty of Paying Living Expenses:   Food Insecurity:   . Worried About Charity fundraiser in the Last Year:   . Arboriculturist in the Last Year:   Transportation Needs:   . Film/video editor (Medical):   Marland Kitchen Lack of Transportation (Non-Medical):   Physical Activity:   . Days of Exercise per Week:   . Minutes of Exercise per Session:   Stress:   . Feeling of Stress :   Social Connections:   . Frequency of Communication with Friends and Family:   . Frequency of Social Gatherings with Friends and Family:   . Attends Religious Services:   . Active Member of Clubs or Organizations:   . Attends Archivist Meetings:   Marland Kitchen Marital Status:   Intimate Partner Violence:   . Fear of Current or Ex-Partner:   . Emotionally Abused:   .  Physically Abused:   . Sexually Abused:     Family History  Problem Relation Age of Onset  . Cancer Mother   . Arthritis Mother   . Heart attack Father   . Arthritis Father   . Transient ischemic attack Brother   . Prostate cancer Neg Hx   . Bladder Cancer Neg Hx      Current Outpatient Medications:  .  Abiraterone Acetate (ZYTIGA PO), Take 4 capsules by mouth daily. , Disp: , Rfl:  .  acetaminophen (TYLENOL) 325 MG tablet, Take 650 mg by mouth 2 (two) times daily., Disp: , Rfl:  .  allopurinol (ZYLOPRIM) 300 MG tablet, Take by mouth daily. In am., Disp: , Rfl:  .  bisacodyl (DULCOLAX) 5 MG EC tablet, Take 5 mg by mouth 2 (two) times a day., Disp: , Rfl:  .  calcium carbonate (CALCIUM 600) 600 MG TABS tablet, Take 600 mg by mouth 2 (two) times daily with a meal., Disp: , Rfl:  .  cyanocobalamin (,VITAMIN B-12,) 1000 MCG/ML injection, Inject 1,000 mcg into the muscle every 30 (thirty) days. , Disp: , Rfl:  .  hydrochlorothiazide (HYDRODIURIL) 25 MG tablet, Take 25 mg by mouth. In am, Disp: , Rfl:  .  lidocaine-prilocaine (EMLA)  cream, , Disp: , Rfl:  .  phentermine 15 MG capsule, Take 15 mg by mouth every morning., Disp: , Rfl:  .  potassium chloride (KLOR-CON) 10 MEQ tablet, TAKE 2 TABLETS BY MOUTH ONCE DAILY, Disp: 60 tablet, Rfl: 2 .  predniSONE (DELTASONE) 5 MG tablet, TAKE 1 TABLET (5 MG TOTAL) BY MOUTH 2 TIMES DAILY WITH A MEAL., Disp: 60 tablet, Rfl: 5 .  simvastatin (ZOCOR) 40 MG tablet, Take 40 mg by mouth daily at 6 PM. , Disp: , Rfl:  .  warfarin (COUMADIN) 2 MG tablet, Take 2 mg by mouth. , Disp: , Rfl:  .  warfarin (COUMADIN) 5 MG tablet, , Disp: , Rfl:  No current facility-administered medications for this visit.  Facility-Administered Medications Ordered in Other Visits:  .  denosumab (XGEVA) injection 120 mg, 120 mg, Subcutaneous, Q28 days, Sindy Guadeloupe, MD, 120 mg at 09/07/19 1144 .  denosumab (XGEVA) injection 120 mg, 120 mg, Subcutaneous, Q28 days, Sindy Guadeloupe, MD, 120 mg at 11/15/19 1158  Physical exam:  Vitals:   03/07/20 1203  BP: 130/68  Pulse: 78  Temp: 98.1 F (36.7 C)  TempSrc: Tympanic  SpO2: 98%  Weight: 291 lb (132 kg)  Height: 6' (1.829 m)   Physical Exam Constitutional:      General: He is not in acute distress. HENT:     Head: Normocephalic and atraumatic.  Eyes:     Pupils: Pupils are equal, round, and reactive to light.  Cardiovascular:     Rate and Rhythm: Normal rate and regular rhythm.     Heart sounds: Normal heart sounds.  Pulmonary:     Effort: Pulmonary effort is normal.     Breath sounds: Normal breath sounds.  Abdominal:     General: Bowel sounds are normal.     Palpations: Abdomen is soft.  Musculoskeletal:     Cervical back: Normal range of motion.  Skin:    General: Skin is warm and dry.  Neurological:     Mental Status: He is alert and oriented to person, place, and time.      CMP Latest Ref Rng & Units 03/07/2020  Glucose 70 - 99 mg/dL 100(H)  BUN 8 - 23 mg/dL  34(H)  Creatinine 0.61 - 1.24 mg/dL 1.23  Sodium 135 - 145 mmol/L 139    Potassium 3.5 - 5.1 mmol/L 4.2  Chloride 98 - 111 mmol/L 103  CO2 22 - 32 mmol/L 25  Calcium 8.9 - 10.3 mg/dL 10.1  Total Protein 6.5 - 8.1 g/dL 6.6  Total Bilirubin 0.3 - 1.2 mg/dL 1.5(H)  Alkaline Phos 38 - 126 U/L 55  AST 15 - 41 U/L 24  ALT 0 - 44 U/L 15   CBC Latest Ref Rng & Units 03/07/2020  WBC 4.0 - 10.5 K/uL 4.1  Hemoglobin 13.0 - 17.0 g/dL 12.0(L)  Hematocrit 39.0 - 52.0 % 36.3(L)  Platelets 150 - 400 K/uL 132(L)      Assessment and plan- Patient is a 74 y.o. male with castrate resistant prostate cancer and metastases to lymph nodes and bone here to discuss further management  1.  Patient had a steady increase in his PSA since April 2020 from 6.1-23.9 today indicating a PSA doubling time of about 6 months.  His scans however show continued slight progression with mild increase in his lymph nodes as well as bone lesions in his scapula T4 and T7 based on his scan in November 2020.  At this time I would like to get a repeat CT chest abdomen and pelvis and bone scan to assess his present disease status.  We have been unable to get a repeat biopsy given the site of his lymph nodes and not amenable for biopsy per radiology but I will see if things have changed based on his present scan  2.  If biopsy is not possible I would like to change his present treatment from [place] either to [place] or cabazitaxel.  I will send the patient to Dr. Roosevelt Locks from [facility] for second opinion prior and to see if he would be a candidate for any clinical trial at this time.  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  3.  Patient will receive his next dose of Xgeva today.  He is not currently on ADT as he is s/p bilateral orchiectomy  I will see him back in 4 weeks time with scans prior after second opinion from [facility].    Visit Diagnosis 1. Malignant neoplasm of prostate ([place])   2. High risk  medication use   3. Goals of care, counseling/discussion   4. Long term (current) use of bisphosphonates      Dr. Randa Evens, MD, MPH [facility] at [facility] [id number] 03/11/2020 2:43 PM

## 2020-03-12 ENCOUNTER — Telehealth: Payer: Self-pay | Admitting: *Deleted

## 2020-03-12 NOTE — Telephone Encounter (Signed)
Called office yest. And spoke to [place] who was going to give me an appt but then she said she can't because pt insurance is out of network. She gave me the financial services [phone] . I did call and speak to a guy and I was told that inn netwrk with [facility] in [place] and if he comes to [place] it will be out of network. I did tell him that he has coverage for out of network providers for specialty md 60 dollars copay. I called pt and he is good with that. I did say if they do anything else besides a md visit it will have a cost to him. So he is agreeable and I called back and I was told by Malachy Mood that this is not the way we do things. You have to send in the info and they check insurance and check financial and then make appt and call pt/ office. I have told this to pt. And he understands. I did put on cover sheet that the appt is for after 4/7 when he has his scans

## 2020-03-18 DIAGNOSIS — I482 Chronic atrial fibrillation, unspecified: Secondary | ICD-10-CM | POA: Diagnosis not present

## 2020-03-18 DIAGNOSIS — E782 Mixed hyperlipidemia: Secondary | ICD-10-CM | POA: Diagnosis not present

## 2020-03-18 DIAGNOSIS — E538 Deficiency of other specified B group vitamins: Secondary | ICD-10-CM | POA: Diagnosis not present

## 2020-03-18 DIAGNOSIS — M1A00X0 Idiopathic chronic gout, unspecified site, without tophus (tophi): Secondary | ICD-10-CM | POA: Diagnosis not present

## 2020-03-19 ENCOUNTER — Telehealth: Payer: Self-pay

## 2020-03-19 ENCOUNTER — Ambulatory Visit
Admission: RE | Admit: 2020-03-19 | Discharge: 2020-03-19 | Disposition: A | Discharge status: Home / Self Care | Payer: PPO | Source: Ambulatory Visit | Attending: Oncology | Admitting: Oncology

## 2020-03-19 ENCOUNTER — Other Ambulatory Visit: Payer: Self-pay

## 2020-03-19 ENCOUNTER — Encounter
Admission: RE | Admit: 2020-03-19 | Discharge: 2020-03-19 | Disposition: A | Discharge status: Home / Self Care | Payer: PPO | Source: Ambulatory Visit | Attending: Oncology | Admitting: Oncology

## 2020-03-19 ENCOUNTER — Encounter (HOSPITAL_BASED_OUTPATIENT_CLINIC_OR_DEPARTMENT_OTHER)
Admission: RE | Admit: 2020-03-19 | Discharge: 2020-03-19 | Disposition: A | Discharge status: Home / Self Care | Payer: PPO | Source: Ambulatory Visit | Attending: Oncology | Admitting: Oncology

## 2020-03-19 ENCOUNTER — Other Ambulatory Visit: Payer: Self-pay | Admitting: Oncology

## 2020-03-19 DIAGNOSIS — C61 Malignant neoplasm of prostate: Secondary | ICD-10-CM

## 2020-03-19 DIAGNOSIS — C7951 Secondary malignant neoplasm of bone: Secondary | ICD-10-CM | POA: Diagnosis not present

## 2020-03-19 DIAGNOSIS — C772 Secondary and unspecified malignant neoplasm of intra-abdominal lymph nodes: Secondary | ICD-10-CM | POA: Diagnosis not present

## 2020-03-19 DIAGNOSIS — Z8546 Personal history of malignant neoplasm of prostate: Secondary | ICD-10-CM | POA: Diagnosis not present

## 2020-03-19 MED ORDER — IOHEXOL 300 MG/ML  SOLN
125.0000 mL | Freq: Once | INTRAMUSCULAR | Status: AC | PRN
  Administered 2020-03-19: 10:00:00 125 mL via INTRAVENOUS

## 2020-03-19 MED ORDER — TECHNETIUM TC 99M MEDRONATE IV KIT
20.0000 | PACK | Freq: Once | INTRAVENOUS | Status: AC | PRN
  Administered 2020-03-19: 10:00:00 20.953 via INTRAVENOUS

## 2020-03-19 NOTE — Telephone Encounter (Signed)
Called patient's insurance ([employer]) and asked who I could speak in getting a [facility] provider-Dr. Art Buff in-network. I was told by the representative that I would have to go online and fill out a form requesting prior-authorization and then send it back to them. We would have to give them 48-72 hours to review and then they will let us know via fax if it was authorized or not. This will be informed to Dr. Janese Banks.  Online information: [email or web address]

## 2020-03-19 NOTE — Telephone Encounter (Signed)
Voicemail from [place] at [facility] calling to let us know they need a In Network authorization for patient to be seen. She left [place] NPI number Tax ID and CPT to use for patient  NPI: [id number] Tax ID: [id number] CPT: [id number]  Call back number [phone]

## 2020-03-20 ENCOUNTER — Telehealth: Payer: Self-pay

## 2020-03-20 NOTE — Telephone Encounter (Signed)
Patient prior-auth form was faxed today to [employer]. Now we wait for authorization approval/denial.  Phone: [phone] Fax: [phone]

## 2020-03-20 NOTE — Telephone Encounter (Signed)
CT Biopsy form was faxed to Specialty so they could schedule it.

## 2020-03-20 NOTE — Telephone Encounter (Signed)
Today I faxed a letter requesting cardiac clearance for patient to get his biopsy done at [facility] Once I get a response from dr. Sharlet Salina office, I will fax it to [place].  Dr. Roosevelt Locks at [facility] Phone: [phone] Fax: [phone]

## 2020-03-25 ENCOUNTER — Encounter: Payer: Self-pay | Admitting: Oncology

## 2020-03-25 DIAGNOSIS — Z0000 Encounter for general adult medical examination without abnormal findings: Secondary | ICD-10-CM | POA: Diagnosis not present

## 2020-03-25 DIAGNOSIS — M1A00X0 Idiopathic chronic gout, unspecified site, without tophus (tophi): Secondary | ICD-10-CM | POA: Diagnosis not present

## 2020-03-25 DIAGNOSIS — E538 Deficiency of other specified B group vitamins: Secondary | ICD-10-CM | POA: Diagnosis not present

## 2020-03-25 DIAGNOSIS — C61 Malignant neoplasm of prostate: Secondary | ICD-10-CM | POA: Diagnosis not present

## 2020-03-25 DIAGNOSIS — I482 Chronic atrial fibrillation, unspecified: Secondary | ICD-10-CM | POA: Diagnosis not present

## 2020-03-25 DIAGNOSIS — D696 Thrombocytopenia, unspecified: Secondary | ICD-10-CM | POA: Diagnosis not present

## 2020-03-25 DIAGNOSIS — R739 Hyperglycemia, unspecified: Secondary | ICD-10-CM | POA: Diagnosis not present

## 2020-03-25 NOTE — Telephone Encounter (Signed)
Prior-auth was received for patient to see Dr. Latanya Maudlin. Therefore, I called Paige from Financial Department at [facility] and let them know. Arby Barrette wanted me to fax her a copy to [phone] and I did. I asked if I was able to call the clinic to schedule appointmet and Arby Barrette stated that she sends a message to the schedulers and they contact the patient to schedule a new patient appointment.

## 2020-03-25 NOTE — Telephone Encounter (Signed)
Received a letter from Dr. Saralyn Pilar office letting me know that the patient had not been seen since 2014. Therefore patient will need an appointment to have a cardiac clearance, coumadin clearance and pacemaker checked before we could get clearance. Specialty scheduling was notified and so was Dr. Janese Banks. As soon as we do, we will let specialty scheduling know so we could order his CT Biopsy again and get that moving.

## 2020-03-26 ENCOUNTER — Other Ambulatory Visit: Payer: Self-pay | Admitting: Oncology

## 2020-03-26 DIAGNOSIS — I480 Paroxysmal atrial fibrillation: Secondary | ICD-10-CM | POA: Diagnosis not present

## 2020-03-26 DIAGNOSIS — C61 Malignant neoplasm of prostate: Secondary | ICD-10-CM

## 2020-03-26 MED FILL — predniSONE 5 MG TABS: 5 | 30 days supply | Qty: 60 | Fill #0

## 2020-03-27 ENCOUNTER — Telehealth: Payer: Self-pay | Admitting: *Deleted

## 2020-03-27 DIAGNOSIS — I4811 Longstanding persistent atrial fibrillation: Secondary | ICD-10-CM | POA: Diagnosis not present

## 2020-03-27 DIAGNOSIS — I480 Paroxysmal atrial fibrillation: Secondary | ICD-10-CM | POA: Diagnosis not present

## 2020-03-27 DIAGNOSIS — E782 Mixed hyperlipidemia: Secondary | ICD-10-CM | POA: Diagnosis not present

## 2020-03-27 DIAGNOSIS — I6300 Cerebral infarction due to thrombosis of unspecified precerebral artery: Secondary | ICD-10-CM | POA: Diagnosis not present

## 2020-03-27 DIAGNOSIS — I495 Sick sinus syndrome: Secondary | ICD-10-CM | POA: Diagnosis not present

## 2020-03-27 DIAGNOSIS — I420 Dilated cardiomyopathy: Secondary | ICD-10-CM | POA: Diagnosis not present

## 2020-03-27 NOTE — Telephone Encounter (Signed)
Pt called to say that he went to cardiology today. They said his hear rate was up and they were putting him on medication to make the rate decrease and he will be re evaluated in 1 week.. I told him I will pass info along to [employer]

## 2020-03-28 NOTE — Telephone Encounter (Signed)
The main purpose of seeing cardiology was to see if he can hold his coumadin for biopsy

## 2020-03-28 NOTE — Telephone Encounter (Signed)
Called [facility] and asked if patient's appointment with Dr. Roosevelt Locks had been scheduled. The receptionist stated that she was going to personally call the patient and schedule an appointment for the patient.

## 2020-03-31 ENCOUNTER — Telehealth: Payer: Self-pay | Admitting: *Deleted

## 2020-03-31 NOTE — Telephone Encounter (Signed)
Call Dr. Kristeen Miss office to determine whether or not patient is okay to come off of warfarin to get a biopsy.  Doctor's note does describe he is aware that we need to come off the warfarin for a biopsy however in the later notices continue warfarin.  I have talked to [facility] the Network engineer at his office and asked if we could get a confirmation and to edit his note or write a letter that gives [place] permission to come off the Coumadin for 5 days prior to biopsy and if any questions can give me a call back

## 2020-03-31 NOTE — Telephone Encounter (Signed)
I read note from dr Saralyn Pilar and it says that he knows he is on coumadin and feels like he is low risk for cardiac issues but then says on warfarin-to cont. It. I have called his office to get clear message if he is ok for [place] to hold coumadin 5 days prior to getting bx. Will await response

## 2020-03-31 NOTE — Telephone Encounter (Signed)
Called [facility] again to ask if they had contacted the patient to schedule him an appointment to see Dr. Juliann Pares. I was told that they had reached out to the patient and he did not answer. I was told that she could try to call patient again and try to schedule him an appointment. I will call again later on the weeks to make sure that he gets seen.

## 2020-04-01 ENCOUNTER — Telehealth: Payer: Self-pay | Admitting: *Deleted

## 2020-04-01 NOTE — Telephone Encounter (Signed)
Called to clarify if pt can come off coumadin in order to have bx . Dr, Saralyn Pilar documentation in his note for last week clearly spells out he was sent to him for eval of coming off coumadin for a bx. And in his note he write he is low risk but then  Later in note it says cont. Coumadin. I called over and asked for clarification and I did get a call back and Dr. Saralyn Pilar is seeing pt this Thursday and will let us know then

## 2020-04-01 NOTE — Telephone Encounter (Signed)
Called [facility] again to check if patient had been contacted to schedule an appointment. I was told by Malachy Mood (receptionist) that they had not received our referral. I told Malachy Mood that we have been going back and forth with them since March 31 st and we are already on 04/01/2020 and we still do not have an appointment. Malachy Mood stated that she did not see anything in the system and to fax her the referral. I told her that this would be the third time but that yes I would. I confirmed the fax number [phone]) with Malachy Mood and she stated that I had the right number.  Patient's appointment with Dr. Art Buff will be on 04/15/20 at 1:00 PM. [address], [place], [place] [address]  [phone] Patient will be informed about this appointment by me.

## 2020-04-03 ENCOUNTER — Telehealth: Payer: Self-pay

## 2020-04-03 DIAGNOSIS — I6300 Cerebral infarction due to thrombosis of unspecified precerebral artery: Secondary | ICD-10-CM | POA: Diagnosis not present

## 2020-04-03 DIAGNOSIS — C61 Malignant neoplasm of prostate: Secondary | ICD-10-CM | POA: Diagnosis not present

## 2020-04-03 DIAGNOSIS — I4811 Longstanding persistent atrial fibrillation: Secondary | ICD-10-CM | POA: Diagnosis not present

## 2020-04-03 DIAGNOSIS — I495 Sick sinus syndrome: Secondary | ICD-10-CM | POA: Diagnosis not present

## 2020-04-03 DIAGNOSIS — E782 Mixed hyperlipidemia: Secondary | ICD-10-CM | POA: Diagnosis not present

## 2020-04-03 NOTE — Telephone Encounter (Signed)
Called our Radiology Department to let them know that we would appreciate if they could share images to [facility] through [place]. I was told by Butch Penny that she was going to do it as she had me on the phone. Now, patient's images since 10/23/2019 had been transferred/shared for Dr. Art Buff to review.

## 2020-04-03 NOTE — Telephone Encounter (Signed)
Judeen Hammans, I wanted you to know that the patient is scheduled to be seen by [facility] for a second opinion until Apr 15, 2020 by Dr. Roosevelt Locks.

## 2020-04-04 ENCOUNTER — Inpatient Hospital Stay: Payer: PPO | Attending: Oncology

## 2020-04-04 ENCOUNTER — Inpatient Hospital Stay (HOSPITAL_BASED_OUTPATIENT_CLINIC_OR_DEPARTMENT_OTHER): Payer: PPO | Admitting: Oncology

## 2020-04-04 ENCOUNTER — Telehealth: Payer: Self-pay | Admitting: *Deleted

## 2020-04-04 ENCOUNTER — Inpatient Hospital Stay: Payer: PPO

## 2020-04-04 ENCOUNTER — Other Ambulatory Visit: Payer: Self-pay

## 2020-04-04 ENCOUNTER — Encounter: Payer: Self-pay | Admitting: Oncology

## 2020-04-04 VITALS — BP 122/83 | HR 70 | Resp 16 | Wt 288.6 lb

## 2020-04-04 DIAGNOSIS — C61 Malignant neoplasm of prostate: Secondary | ICD-10-CM

## 2020-04-04 DIAGNOSIS — Z79899 Other long term (current) drug therapy: Secondary | ICD-10-CM | POA: Diagnosis not present

## 2020-04-04 DIAGNOSIS — C7951 Secondary malignant neoplasm of bone: Secondary | ICD-10-CM | POA: Diagnosis not present

## 2020-04-04 DIAGNOSIS — Z95828 Presence of other vascular implants and grafts: Secondary | ICD-10-CM

## 2020-04-04 DIAGNOSIS — C778 Secondary and unspecified malignant neoplasm of lymph nodes of multiple regions: Secondary | ICD-10-CM | POA: Diagnosis not present

## 2020-04-04 DIAGNOSIS — E860 Dehydration: Secondary | ICD-10-CM

## 2020-04-04 LAB — COMPREHENSIVE METABOLIC PANEL
ALT: 15 U/L (ref 0–44)
AST: 19 U/L (ref 15–41)
Albumin: 4.3 g/dL (ref 3.5–5.0)
Alkaline Phosphatase: 57 U/L (ref 38–126)
Anion gap: 10 (ref 5–15)
BUN: 30 mg/dL — ABNORMAL HIGH (ref 8–23)
CO2: 24 mmol/L (ref 22–32)
Calcium: 9.4 mg/dL (ref 8.9–10.3)
Chloride: 105 mmol/L (ref 98–111)
Creatinine, Ser: 0.8 mg/dL (ref 0.61–1.24)
GFR calc Af Amer: >60 mL/min (ref >60)
GFR calc non Af Amer: >60 mL/min (ref >60)
Glucose, Bld: 105 mg/dL — ABNORMAL HIGH (ref 70–99)
Potassium: 3.5 mmol/L (ref 3.5–5.1)
Sodium: 139 mmol/L (ref 135–145)
Total Bilirubin: 1.7 mg/dL — ABNORMAL HIGH (ref 0.3–1.2)
Total Protein: 6.9 g/dL (ref 6.5–8.1)

## 2020-04-04 LAB — CBC WITH DIFFERENTIAL/PLATELET
Abs Immature Granulocytes: 0.02 10*3/uL (ref 0.00–0.07)
Basophils Absolute: 0 10*3/uL (ref 0.0–0.1)
Basophils Relative: 0 %
Eosinophils Absolute: 0.1 10*3/uL (ref 0.0–0.5)
Eosinophils Relative: 2 %
HCT: 37.7 % — ABNORMAL LOW (ref 39.0–52.0)
Hemoglobin: 12.4 g/dL — ABNORMAL LOW (ref 13.0–17.0)
Immature Granulocytes: 0 %
Lymphocytes Relative: 16 %
Lymphs Abs: 0.9 10*3/uL (ref 0.7–4.0)
MCH: 30.5 pg (ref 26.0–34.0)
MCHC: 32.9 g/dL (ref 30.0–36.0)
MCV: 92.6 fL (ref 80.0–100.0)
Monocytes Absolute: 0.5 10*3/uL (ref 0.1–1.0)
Monocytes Relative: 9 %
Neutro Abs: 3.9 10*3/uL (ref 1.7–7.7)
Neutrophils Relative %: 73 %
Platelets: 139 10*3/uL — ABNORMAL LOW (ref 150–400)
RBC: 4.07 MIL/uL — ABNORMAL LOW (ref 4.22–5.81)
RDW: 15.2 % (ref 11.5–15.5)
WBC: 5.4 10*3/uL (ref 4.0–10.5)
nRBC: 0 % (ref 0.0–0.2)

## 2020-04-04 LAB — PSA: Prostatic Specific Antigen: 33.26 ng/mL — ABNORMAL HIGH (ref 0.00–4.00)

## 2020-04-04 MED ORDER — DENOSUMAB 120 MG/1.7ML ~~LOC~~ SOLN
120.0000 mg | Freq: Once | SUBCUTANEOUS | Status: AC
  Administered 2020-04-04: 120 mg via SUBCUTANEOUS
  Filled 2020-04-04: qty 1.7

## 2020-04-04 MED ORDER — HEPARIN SOD (PORK) LOCK FLUSH 100 UNIT/ML IV SOLN
INTRAVENOUS | Status: AC
  Filled 2020-04-04: qty 5

## 2020-04-04 MED ORDER — HEPARIN SOD (PORK) LOCK FLUSH 100 UNIT/ML IV SOLN
500.0000 [IU] | Freq: Once | INTRAVENOUS | Status: AC
  Administered 2020-04-04: 500 [IU] via INTRAVENOUS
  Filled 2020-04-04: qty 5

## 2020-04-04 MED ORDER — SODIUM CHLORIDE 0.9% FLUSH
10.0000 mL | Freq: Once | INTRAVENOUS | Status: AC
  Administered 2020-04-04: 11:00:00 10 mL via INTRAVENOUS
  Filled 2020-04-04: qty 10

## 2020-04-04 NOTE — Telephone Encounter (Signed)
Patient was able to get cardiac and coumadin clearance. Patient is able to stop his coumadin 5 days prior to CT Biopsy and then restart. Now we are waiting from Specialty Scheduling to let us know that when he could have his CT biopsy done.

## 2020-04-04 NOTE — Telephone Encounter (Signed)
Called Dr. Kristeen Miss office today , patient was seen yesterday and we were asking for clearance of him to stop his Coumadin for 5 days prior to having his biopsy.  The note from yesterday does not indicate this information.  In order to set him up for a biopsy we will need a letter saying that he can come off of Coumadin for 5 days prior to having a biopsy.  I have left my direct telephone number as well as the fax number and that she will have the nurse contact us and tell us what we can do

## 2020-04-04 NOTE — Progress Notes (Signed)
Patient is here today to go over his treatment plan.

## 2020-04-07 ENCOUNTER — Telehealth: Payer: Self-pay | Admitting: *Deleted

## 2020-04-07 NOTE — Progress Notes (Signed)
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 . He again began to have a rising PSA in 2010 and underwent orchiectomy in 2010.He had biochemical recurrence in 2017 and was treated with IMRT  He underwent CT abdomen and pelvis in May 2018which showed abnormal periaortic and right common iliac lymph nodes. Index aortocaval lymph node 1.4 cm which was new as compared to February 2014 and suspicious for malignancy. Bone scan did not reveal any evidence of bony metastatic disease.  PSA went up from 3.9 in January 20 18-7.8 in April 2018.  13.3 in July 2018 when docetaxel was initiated.  Doubling time at that time was about 3 months  Docetaxel chemo initiated for CRPC with ln mets and rapid doubling PSA in august 2018.Patient completed 5 cycles of docetaxel on 09/27/2017. Cycle #6 was not given due to progressive fatigue and cytopenias as well as worsening neuropathy. Cycle 5 of docetaxel was dose reduced to 60 mg/m square.  Patient never had normalization of his PSA following docetaxel and the lowest PSA after docetaxel was 5.64 in December 2018  In April 2019 patient noted to have rising PSA to 19.02 and increase in the size of retroperitoneal LN. Patient therefore started second line zytiga.   Patient has remained on Zytiga since April 2019.  The lowest PSA value on Zytiga was 6.1 in August 2020 and since then there has been a small but steady  increase in his PSA. Most recent imaging in November 2020 showed increase in the size of retroperitoneal lymph nodes from 0.7 cm to 1.1 cm.  New periaortic lymph node 1.2 cm.  Slight progression of sclerotic bone metastases involving the left scapula as well as T4 and T7.  Most recent PSA on 03/07/2020 was 23.9.  Given the subtle progression Zytiga was not changed at that time.  Patient has not had a repeat tissue biopsy as none of the sites of disease noted on the CT scan were amenable for biopsy   Interval history-patient feels well at this time and denies any complaints of unintentional weight loss or new aches and pains anywhere.  Appetite is good and he is on phentermine for weight loss.  He has baseline mild neuropathy in his hands and feet which is essentially stable  ECOG PS- 1 Pain scale- 0  Review of systems- Review of Systems  Constitutional: Positive for malaise/fatigue. Negative for chills, fever and weight loss.  HENT: Negative for congestion, ear discharge and nosebleeds.   Eyes: Negative for blurred vision.  Respiratory: Negative for cough, hemoptysis, sputum production, shortness of breath and wheezing.   Cardiovascular: Negative for chest pain, palpitations, orthopnea and claudication.  Gastrointestinal: Negative for abdominal pain, blood in stool, constipation, diarrhea, heartburn, melena, nausea and vomiting.  Genitourinary: Negative for dysuria, flank pain, frequency, hematuria and urgency.  Musculoskeletal: Negative for back pain, joint pain and myalgias.  Skin: Negative for rash.  Neurological: Positive for sensory change (Peripheral neuropathy). Negative for dizziness, tingling, focal weakness, seizures, weakness and headaches.  Endo/Heme/Allergies:  Does not bruise/bleed easily.  Psychiatric/Behavioral: Negative for depression and suicidal ideas. The patient does not have insomnia.       Allergies  Allergen Reactions  . Latex Rash    Sensitivity, not allergy.      Past Medical History:  Diagnosis Date  . Arthritis, degenerative 10/30/2015  . Atrial fibrillation ([place]) 10/30/2015  . Bladder spasm 06/12/2013  . Carcinoma of prostate ([place]) 10/30/2015  . Cardiomyopathy ([place]) 09/10/2014   Overview:  EF 35%,4/15   . Cerebrovascular accident (CVA) ([place]) 10/30/2015   Overview:  Left embolic [id number]   . Difficult or painful urination 11/21/2013  . Dysrhythmia   . Gout 10/30/2015  . History of kidney stones   . Malignant neoplasm of prostate ([place]) 09/05/2012  . Sinoatrial node dysfunction ([facility]) 10/30/2015   Overview:  DDD pacemaker, 1996      Past Surgical History:  Procedure Laterality Date  . castration  10/2009  . CATARACT EXTRACTION W/ INTRAOCULAR LENS  IMPLANT, BILATERAL Bilateral 2016   right eye done then the left one done 3 months later in 2016  . COLONOSCOPY WITH PROPOFOL N/A 11/21/2019   Procedure: COLONOSCOPY WITH PROPOFOL;  Surgeon: Robert Bellow, MD;  Location: [facility] ENDOSCOPY;  Service: Endoscopy;  Laterality: N/A;  . INGUINAL HERNIA REPAIR Left   . INSERT / REPLACE / REMOVE PACEMAKER    . IR FLUORO GUIDE PORT INSERTION RIGHT  07/01/2017  . JOINT REPLACEMENT Bilateral 1980   hips  . left knee replacement  2014  . PACEMAKER INSERTION    . PROSTATECTOMY    . REPLACEMENT TOTAL HIP W/  RESURFACING IMPLANTS  1997   bilateral-   . REPLACEMENT TOTAL KNEE Bilateral   . testes removal     per pt   . TONSILLECTOMY AND ADENOIDECTOMY    . TRANSURETHRAL RESECTION OF BLADDER TUMOR N/A 12/31/2015   Procedure: BLADDER BIOPSY;  Surgeon: Nickie Retort, MD;  Location: [facility] ORS;  Service: Urology;  Laterality: N/A;  . Urinary incontinence valve     AMS    Social History   Socioeconomic History  . Marital status: Married    Spouse name: Not on file  . Number of children: Not on file  . Years of education: Not on file  . Highest education level: Not on file  Occupational History  . Not on file  Tobacco Use  . Smoking status:  Never Smoker  . Smokeless tobacco: Never Used  Substance and Sexual Activity  . Alcohol use: No    Alcohol/week: 0.0 standard drinks  . Drug use: No  . Sexual activity: Never  Other Topics Concern  . Not on file  Social History Narrative  . Not on file   Social Determinants of Health   Financial Resource Strain:   . Difficulty of Paying Living Expenses:   Food Insecurity:   . Worried About Charity fundraiser in the Last Year:   . Arboriculturist in the Last Year:   Transportation Needs:   . Film/video editor (Medical):   Marland Kitchen Lack of Transportation (Non-Medical):   Physical Activity:   . Days of Exercise per Week:   . Minutes of Exercise per Session:   Stress:   . Feeling of Stress :   Social Connections:   . Frequency of Communication with Friends and Family:   . Frequency of Social Gatherings with Friends and Family:   . Attends Religious Services:   . Active Member of Clubs or Organizations:   .  Attends Archivist Meetings:   Marland Kitchen Marital Status:   Intimate Partner Violence:   . Fear of Current or Ex-Partner:   . Emotionally Abused:   Marland Kitchen Physically Abused:   . Sexually Abused:     Family History  Problem Relation Age of Onset  . Cancer Mother   . Arthritis Mother   . Heart attack Father   . Arthritis Father   . Transient ischemic attack Brother   . Prostate cancer Neg Hx   . Bladder Cancer Neg Hx      Current Outpatient Medications:  .  Abiraterone Acetate (ZYTIGA PO), Take 4 capsules by mouth daily. , Disp: , Rfl:  .  acetaminophen (TYLENOL) 325 MG tablet, Take 650 mg by mouth 2 (two) times daily., Disp: , Rfl:  .  allopurinol (ZYLOPRIM) 300 MG tablet, Take by mouth daily. In am., Disp: , Rfl:  .  bisacodyl (DULCOLAX) 5 MG EC tablet, Take 5 mg by mouth 2 (two) times a day., Disp: , Rfl:  .  calcium carbonate (CALCIUM 600) 600 MG TABS tablet, Take 600 mg by mouth 2 (two) times daily with a meal., Disp: , Rfl:  .  cyanocobalamin (,VITAMIN B-12,)  1000 MCG/ML injection, Inject 1,000 mcg into the muscle every 30 (thirty) days. , Disp: , Rfl:  .  diltiazem (CARDIZEM CD) 180 MG 24 hr capsule, Take 180 mg by mouth daily., Disp: , Rfl:  .  furosemide (LASIX) 20 MG tablet, Take 20 mg by mouth daily., Disp: , Rfl:  .  lidocaine-prilocaine (EMLA) cream, , Disp: , Rfl:  .  phentermine 15 MG capsule, Take 15 mg by mouth every morning., Disp: , Rfl:  .  potassium chloride (KLOR-CON) 10 MEQ tablet, TAKE 2 TABLETS BY MOUTH ONCE DAILY, Disp: 60 tablet, Rfl: 2 .  predniSONE (DELTASONE) 5 MG tablet, TAKE 1 TABLET (5 MG TOTAL) BY MOUTH 2 TIMES DAILY WITH A MEAL., Disp: 60 tablet, Rfl: 5 .  simvastatin (ZOCOR) 40 MG tablet, Take 40 mg by mouth daily at 6 PM. , Disp: , Rfl:  .  warfarin (COUMADIN) 1 MG tablet, Take 1 mg by mouth daily., Disp: , Rfl:  .  warfarin (COUMADIN) 5 MG tablet, , Disp: , Rfl:  .  warfarin (COUMADIN) 6 MG tablet, Take 1 tablet by mouth., Disp: , Rfl:  No current facility-administered medications for this visit.  Facility-Administered Medications Ordered in Other Visits:  .  denosumab (XGEVA) injection 120 mg, 120 mg, Subcutaneous, Q28 days, Sindy Guadeloupe, MD, 120 mg at 09/07/19 1144 .  denosumab (XGEVA) injection 120 mg, 120 mg, Subcutaneous, Q28 days, Sindy Guadeloupe, MD, 120 mg at 11/15/19 1158  Physical exam: There were no vitals filed for this visit. Physical Exam Constitutional:      General: He is not in acute distress. HENT:     Head: Normocephalic and atraumatic.  Eyes:     Pupils: Pupils are equal, round, and reactive to light.  Cardiovascular:     Rate and Rhythm: Normal rate and regular rhythm.     Heart sounds: Normal heart sounds.  Pulmonary:     Effort: Pulmonary effort is normal.     Breath sounds: Normal breath sounds.  Abdominal:     General: Bowel sounds are normal.     Palpations: Abdomen is soft.  Musculoskeletal:     Cervical back: Normal range of motion.  Skin:    General: Skin is warm and dry.    Neurological:  Mental Status: He is alert and oriented to person, place, and time.      CMP Latest Ref Rng & Units 04/04/2020  Glucose 70 - 99 mg/dL 105(H)  BUN 8 - 23 mg/dL 30(H)  Creatinine 0.61 - 1.24 mg/dL 0.80  Sodium 135 - 145 mmol/L 139  Potassium 3.5 - 5.1 mmol/L 3.5  Chloride 98 - 111 mmol/L 105  CO2 22 - 32 mmol/L 24  Calcium 8.9 - 10.3 mg/dL 9.4  Total Protein 6.5 - 8.1 g/dL 6.9  Total Bilirubin 0.3 - 1.2 mg/dL 1.7(H)  Alkaline Phos 38 - 126 U/L 57  AST 15 - 41 U/L 19  ALT 0 - 44 U/L 15   CBC Latest Ref Rng & Units 04/04/2020  WBC 4.0 - 10.5 K/uL 5.4  Hemoglobin 13.0 - 17.0 g/dL 12.4(L)  Hematocrit 39.0 - 52.0 % 37.7(L)  Platelets 150 - 400 K/uL 139(L)    No images are attached to the encounter.  CT Chest W Contrast  Result Date: 03/19/2020 CLINICAL DATA:  Castrate resistant prostate cancer with lymph node and bone metastasis. EXAM: CT CHEST, ABDOMEN, AND PELVIS WITH CONTRAST TECHNIQUE: Multidetector CT imaging of the chest, abdomen and pelvis was performed following the standard protocol during bolus administration of intravenous contrast. CONTRAST:  181mL OMNIPAQUE IOHEXOL 300 MG/ML  SOLN COMPARISON:  10/23/2019 CTs and bone scan. Clinic note of 03/07/2020 is reviewed. FINDINGS: CT CHEST FINDINGS Cardiovascular: Aortic atherosclerosis. Tortuous thoracic aorta. Moderate cardiomegaly with pacer. No pericardial effusion. Multivessel coronary artery atherosclerosis. No central pulmonary embolism, on this non-dedicated study. Right Port-A-Cath tip at high right atrium. Mediastinum/Nodes: Low left jugular/supraclavicular nodes of maximally 8 mm on 05/05 107, increased from 5 mm on the prior. AP window node measures 1.1 cm on 23/5 107 and is similar to on the prior. A node within the azygoesophageal recess measures 1.2 cm on 31/5 107 versus 1.1 cm on the prior. No hilar adenopathy. Right retrocrural node measures 8 mm on 57/507 versus 7 mm previously. Lungs/Pleura: No pleural  fluid. Left greater than right base scarring. No suspicious pulmonary nodule or mass. Musculoskeletal: Degenerative changes of both shoulders. Remote right rib fractures. Marked bilateral gynecomastia. Mild T3 compression deformity is not significantly changed. Sclerotic lesion involving the anterior aspect of the T4 vertebral body is similar at approximately 8 mm. Subtle sclerosis involving the right pedicle at T7 is similar including on sagittal image 125. Moderate T12 compression deformity is not significantly changed. CT ABDOMEN PELVIS FINDINGS Hepatobiliary: Contrast reflux into the IVC, suggesting elevated right heart pressures. No focal liver lesion. Normal gallbladder, without biliary ductal dilatation. Pancreas: Pancreatic atrophy without duct dilatation or acute inflammation. Spleen: Normal in size, without focal abnormality. Adrenals/Urinary Tract: Normal adrenal glands. Left extrarenal pelvis. Normal right kidney, without hydronephrosis. Degraded evaluation of the pelvis, secondary to beam hardening artifact from bilateral hip arthroplasty. No gross bladder abnormality. Stomach/Bowel: Normal stomach, without wall thickening. Normal colon and terminal ileum. Normal small bowel. Vascular/Lymphatic: Aortic and branch vessel atherosclerosis. Abdominal retroperitoneal adenopathy. A left periaortic nodal conglomerate measures 2.3 x 2.8 cm on 72/507 versus 2.1 x 1.8 cm on the prior exam (when remeasured). An index aortocaval node measures 1.3 cm on 78/507 versus 1.0 cm on the prior exam (when remeasured). Right common iliac node measures 1.2 cm on 94/507 versus 1.0 cm on the prior. Pelvic node dissection. Reproductive: Prostatectomy, with poor evaluation of the operative bed. Other: No gross free pelvic fluid. Fluid within the right inguinal canal is unchanged. Musculoskeletal: Bilateral  hip arthroplasty. Osteopenia. Similar lucency adjacent the left hip arthroplasty acetabular component. IMPRESSION: 1.  Progression of nodal metastasis within the abdomen and upper pelvis. 2. Similar to slight enlargement of thoracic nodes, for which metastatic disease cannot be excluded. Enlarging low left jugular/supraclavicular nodes, also suspicious. 3. Similar osseous metastasis. 4. Degraded evaluation of the pelvis secondary to beam hardening artifact from bilateral hip arthroplasty. 5. Coronary artery atherosclerosis. Aortic Atherosclerosis (ICD10-I70.0). Electronically Signed   By: Abigail Miyamoto M.D.   On: 03/19/2020 12:15   NM Bone Scan Whole Body  Result Date: 03/19/2020 CLINICAL DATA:  History of prostate cancer. EXAM: NUCLEAR MEDICINE WHOLE BODY BONE SCAN TECHNIQUE: Whole body anterior and posterior images were obtained approximately 3 hours after intravenous injection of radiopharmaceutical. RADIOPHARMACEUTICALS:  20.953 mCi Technetium-53m MDP IV COMPARISON:  October 23, 2019 FINDINGS: A small focus of increased tracer activity is seen along the left parietal bone. This is unchanged in appearance when compared to the prior study. Asymmetric increased tracer activity is again seen along the inferior aspect of the left scapula. The focal tracer uptake seen along the posterior aspect of the third right rib is no longer visualized. Very mild focal increased tracer uptake is seen at the approximate level of the T7 vertebral body on the right. This represents a new finding. Diffusely increased tracer activity is again seen within the bilateral feet, bilateral ankles and bilateral shoulders, left greater than right. Photopenic areas are seen involving both hips and both knees consistent with bilateral hip and bilateral knee replacements. A small focus of increased tracer activity is seen within the subcutaneous soft tissues along the lateral aspect of the right calf. This represents a new finding. Physiologic tracer uptake is seen within the bilateral kidneys and urinary bladder. IMPRESSION: 1. Predominant stable focal areas  of increased tracer uptake involving the left parietal bone and inferior aspect of scapula on the left, with interval development of very mild focal increased tracer uptake at the approximate level of the T7 vertebral body on the right. While the stable lesions likely represent stable metastatic foci, the new thoracic spine lesion is indeterminate in nature. A new small metastatic focus cannot be excluded. 2. Interval resolution of the focal tracer uptake seen along the posterior aspect of the third right rib on the prior exam. 3. Stable degenerative changes within the bilateral shoulders, bilateral ankles and bilateral feet. 4. Findings consistent with bilateral hip and bilateral knee replacements. 5. Focal superficial soft tissue uptake along the lateral aspect of the right calf. Correlation with physical examination of this region is recommended. Electronically Signed   By: Virgina Norfolk M.D.   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 EXAM: CT CHEST, ABDOMEN, AND PELVIS WITH CONTRAST TECHNIQUE: Multidetector CT imaging of the chest, abdomen and pelvis was performed following the standard protocol during bolus administration of intravenous contrast. CONTRAST:  127mL OMNIPAQUE IOHEXOL 300 MG/ML  SOLN COMPARISON:  10/23/2019 CTs and bone scan. Clinic note of 03/07/2020 is reviewed. FINDINGS: CT CHEST FINDINGS Cardiovascular: Aortic atherosclerosis. Tortuous thoracic aorta. Moderate cardiomegaly with pacer. No pericardial effusion. Multivessel coronary artery atherosclerosis. No central pulmonary embolism, on this non-dedicated study. Right Port-A-Cath tip at high right atrium. Mediastinum/Nodes: Low left jugular/supraclavicular nodes of maximally 8 mm on 05/05 107, increased from 5 mm on the prior. AP window node measures 1.1 cm on 23/5 107 and is similar to  on the prior. A node within  the azygoesophageal recess measures 1.2 cm on 31/5 107 versus 1.1 cm on the prior. No hilar adenopathy. Right retrocrural node measures 8 mm on 57/507 versus 7 mm previously. Lungs/Pleura: No pleural fluid. Left greater than right base scarring. No suspicious pulmonary nodule or mass. Musculoskeletal: Degenerative changes of both shoulders. Remote right rib fractures. Marked bilateral gynecomastia. Mild T3 compression deformity is not significantly changed. Sclerotic lesion involving the anterior aspect of the T4 vertebral body is similar at approximately 8 mm. Subtle sclerosis involving the right pedicle at T7 is similar including on sagittal image 125. Moderate T12 compression deformity is not significantly changed. CT ABDOMEN PELVIS FINDINGS Hepatobiliary: Contrast reflux into the IVC, suggesting elevated right heart pressures. No focal liver lesion. Normal gallbladder, without biliary ductal dilatation. Pancreas: Pancreatic atrophy without duct dilatation or acute inflammation. Spleen: Normal in size, without focal abnormality. Adrenals/Urinary Tract: Normal adrenal glands. Left extrarenal pelvis. Normal right kidney, without hydronephrosis. Degraded evaluation of the pelvis, secondary to beam hardening artifact from bilateral hip arthroplasty. No gross bladder abnormality. Stomach/Bowel: Normal stomach, without wall thickening. Normal colon and terminal ileum. Normal small bowel. Vascular/Lymphatic: Aortic and branch vessel atherosclerosis. Abdominal retroperitoneal adenopathy. A left periaortic nodal conglomerate measures 2.3 x 2.8 cm on 72/507 versus 2.1 x 1.8 cm on the prior exam (when remeasured). An index aortocaval node measures 1.3 cm on 78/507 versus 1.0 cm on the prior exam (when remeasured). Right common iliac node measures 1.2 cm on 94/507 versus 1.0 cm on the prior. Pelvic node dissection. Reproductive: Prostatectomy, with poor evaluation of the operative bed. Other: No gross free pelvic fluid.  Fluid within the right inguinal canal is unchanged. Musculoskeletal: Bilateral hip arthroplasty. Osteopenia. Similar lucency adjacent the left hip arthroplasty acetabular component. IMPRESSION: 1. Progression of nodal metastasis within the abdomen and upper pelvis. 2. Similar to slight enlargement of thoracic nodes, for which metastatic disease cannot be excluded. Enlarging low left jugular/supraclavicular nodes, also suspicious. 3. Similar osseous metastasis. 4. Degraded evaluation of the pelvis secondary to beam hardening artifact from bilateral hip arthroplasty. 5. Coronary artery atherosclerosis. Aortic Atherosclerosis (ICD10-I70.0). Electronically Signed   By: Abigail Miyamoto M.D.   On: 03/19/2020 12:15     Assessment and plan- Patient is a 74 y.o. male with castrate resistant metastatic prostate cancer currently on Zytiga and prednisone.  He is here to discuss the results of blood work and CT scans  Patient's PSA doubling time is currently around 2-1/2 months while he is on Zytiga and prednisone.  We finally received clearance from cardiology to hold his Coumadin for a repeat lymph node biopsy which he will likely get next week.  I have reviewed CT chest abdomen and pelvis images independently and discussed findings with the patient.  Also discussed findings of bone scan.  Patient continues to have mild progression of disease.  Intra-abdominal adenopathy is mildly worse at 2.3 x 2.8 cm compared to 2.1 x 1.8 cm prior.  He was also noted to have mild enlargement of intrathoracic lymph nodes.  Bone scan shows possible new uptake in T7 vertebral body.  Uptake along the left parietal bone and left scapula has been overall stable.  All these findings are overall consistent with steady but mild progression of disease.  Keeping in mind his age and his existing quality of life potential options at this time include: a.  Provenge although it would not give [place] an idea based on PSA but has been shown to improve  overall survival. He will need cardiac clearance prior to this.  b.  Switching him from [place] to [place]. C.  Cabazitaxel: However his progression is mild and given his baseline neuropathy, this may be reserved at some point in the future.  Patient has a second opinion with Dr. Roosevelt Locks at [facility] in 10 days time and I will await her opinion before switching treatment.  We will also hopefully have the lymph node biopsy results which we can send for NGS testing.  In the meanwhile patient will continue to receive Xgeva monthly for his bone metastases and he will receive a shot today.  He will remain on Zytiga and prednisone.  He is s/p bilateral orchiectomy and will therefore not require androgen deprivation therapy like Lupron.  I will tentatively see him in 1 month but potentially sooner based on second opinion at [facility]   Visit Diagnosis 1. Malignant neoplasm of prostate [facility])      Dr. Randa Evens, MD, MPH [facility] at [facility] [id number] 04/07/2020 3:20 PM

## 2020-04-07 NOTE — Telephone Encounter (Signed)
Called pt and let him know that pt having bx 5/6 with arrival at 9:30 and bx 10:30.  Patient will need a driver to drive [place] medical mall on that date.  N.p.o. after midnight.  Patient needs to have any medicines that day he can take it with just a sip of water to get the pill down.  Patient is typically there from 2 to 4 hours.  Patient should stop taking his Coumadin the last day he will take it is April 30 which is a Friday and then he will be off the Coumadin for May 1 second third fourth fifth and 6 he can resume it back on 7 May.  Patient understands all the instructions and will call if he has questions

## 2020-04-09 NOTE — Telephone Encounter (Signed)
Pt was cleared for surgery and his last dose of coumadin 4/30. Off coumadin May1 through 6. Will restart after bx the next day 5/7. Pt aware of this

## 2020-04-10 MED FILL — ABIRATERONE ACETATE 250 MG: 250 | 30 days supply | Qty: 120 | Fill #2

## 2020-04-15 DIAGNOSIS — C7951 Secondary malignant neoplasm of bone: Secondary | ICD-10-CM | POA: Diagnosis not present

## 2020-04-15 DIAGNOSIS — C773 Secondary and unspecified malignant neoplasm of axilla and upper limb lymph nodes: Secondary | ICD-10-CM | POA: Diagnosis not present

## 2020-04-15 DIAGNOSIS — C61 Malignant neoplasm of prostate: Secondary | ICD-10-CM | POA: Diagnosis not present

## 2020-04-16 ENCOUNTER — Other Ambulatory Visit: Payer: Self-pay | Admitting: Radiology

## 2020-04-16 DIAGNOSIS — C779 Secondary and unspecified malignant neoplasm of lymph node, unspecified: Secondary | ICD-10-CM | POA: Insufficient documentation

## 2020-04-16 DIAGNOSIS — C7951 Secondary malignant neoplasm of bone: Secondary | ICD-10-CM | POA: Insufficient documentation

## 2020-04-17 ENCOUNTER — Ambulatory Visit
Admission: RE | Admit: 2020-04-17 | Discharge: 2020-04-17 | Disposition: A | Discharge status: Home / Self Care | Payer: PPO | Source: Ambulatory Visit | Attending: Oncology | Admitting: Oncology

## 2020-04-17 ENCOUNTER — Other Ambulatory Visit: Payer: Self-pay

## 2020-04-17 DIAGNOSIS — Z8249 Family history of ischemic heart disease and other diseases of the circulatory system: Secondary | ICD-10-CM | POA: Insufficient documentation

## 2020-04-17 DIAGNOSIS — C61 Malignant neoplasm of prostate: Secondary | ICD-10-CM | POA: Diagnosis not present

## 2020-04-17 DIAGNOSIS — Z950 Presence of cardiac pacemaker: Secondary | ICD-10-CM | POA: Insufficient documentation

## 2020-04-17 DIAGNOSIS — C7951 Secondary malignant neoplasm of bone: Secondary | ICD-10-CM | POA: Diagnosis not present

## 2020-04-17 DIAGNOSIS — Z8673 Personal history of transient ischemic attack (TIA), and cerebral infarction without residual deficits: Secondary | ICD-10-CM | POA: Diagnosis not present

## 2020-04-17 DIAGNOSIS — C772 Secondary and unspecified malignant neoplasm of intra-abdominal lymph nodes: Secondary | ICD-10-CM | POA: Diagnosis not present

## 2020-04-17 DIAGNOSIS — I4891 Unspecified atrial fibrillation: Secondary | ICD-10-CM | POA: Insufficient documentation

## 2020-04-17 DIAGNOSIS — Z809 Family history of malignant neoplasm, unspecified: Secondary | ICD-10-CM | POA: Diagnosis not present

## 2020-04-17 DIAGNOSIS — Z79899 Other long term (current) drug therapy: Secondary | ICD-10-CM | POA: Diagnosis not present

## 2020-04-17 DIAGNOSIS — R590 Localized enlarged lymph nodes: Secondary | ICD-10-CM | POA: Diagnosis not present

## 2020-04-17 DIAGNOSIS — Z7901 Long term (current) use of anticoagulants: Secondary | ICD-10-CM | POA: Insufficient documentation

## 2020-04-17 DIAGNOSIS — Z8546 Personal history of malignant neoplasm of prostate: Secondary | ICD-10-CM | POA: Diagnosis not present

## 2020-04-17 LAB — PROTIME-INR
INR: 1.1 (ref 0.8–1.2)
Prothrombin Time: 13.3 seconds (ref 11.4–15.2)

## 2020-04-17 LAB — CBC
HCT: 36.9 % — ABNORMAL LOW (ref 39.0–52.0)
Hemoglobin: 12.5 g/dL — ABNORMAL LOW (ref 13.0–17.0)
MCH: 31.2 pg (ref 26.0–34.0)
MCHC: 33.9 g/dL (ref 30.0–36.0)
MCV: 92 fL (ref 80.0–100.0)
Platelets: 123 10*3/uL — ABNORMAL LOW (ref 150–400)
RBC: 4.01 MIL/uL — ABNORMAL LOW (ref 4.22–5.81)
RDW: 15.2 % (ref 11.5–15.5)
WBC: 5.1 10*3/uL (ref 4.0–10.5)
nRBC: 0 % (ref 0.0–0.2)

## 2020-04-17 MED ORDER — SODIUM CHLORIDE 0.9 % IV SOLN: INTRAVENOUS | Status: DC

## 2020-04-17 MED ORDER — FENTANYL CITRATE (PF) 100 MCG/2ML IJ SOLN
INTRAMUSCULAR | Status: AC
  Filled 2020-04-17: qty 2

## 2020-04-17 MED ORDER — MIDAZOLAM HCL 2 MG/2ML IJ SOLN
INTRAMUSCULAR | Status: AC | PRN
  Administered 2020-04-17: 1 mg via INTRAVENOUS

## 2020-04-17 MED ORDER — MIDAZOLAM HCL 5 MG/5ML IJ SOLN
INTRAMUSCULAR | Status: AC
  Filled 2020-04-17: qty 5

## 2020-04-17 MED ORDER — HEPARIN SOD (PORK) LOCK FLUSH 100 UNIT/ML IV SOLN
INTRAVENOUS | Status: AC
  Filled 2020-04-17: qty 5

## 2020-04-17 MED ORDER — FENTANYL CITRATE (PF) 100 MCG/2ML IJ SOLN
INTRAMUSCULAR | Status: AC | PRN
  Administered 2020-04-17 (×2): 50 ug via INTRAVENOUS

## 2020-04-17 NOTE — Procedures (Signed)
Pre procedural Dx: Retroperitoneal lymphadenopathy  Post procedural Dx: Same  Technically successful CT guided biopsy of indeterminate left sided retroperitoneal nodal conglomeration.    EBL: None.   Complications: None immediate.   Ronny Bacon, MD Pager #: [phone]

## 2020-04-17 NOTE — Consult Note (Signed)
Chief Complaint: Retroperitoneal lymphadenopathy  Referring Physician(s): Rao,Archana C  Patient Status: [facility] - Out-pt  History of Present Illness: Jeremy Carney is a 74 y.o. male with past medical history significant for atrial fibrillation, cardiomyopathy, CVA, nephrolithiasis and prostate cancer, now with retroperitoneal lymphadenopathy worrisome for metastatic disease.  Patient presents today for CT-guided left-sided retroperitoneal lymph node biopsy.  Patient has baseline state of health and is currently without acute complaint.  Specifically, no chest pain, shortness of breath, fever or chills.  Past Medical History:  Diagnosis Date  . Arthritis, degenerative 10/30/2015  . Atrial fibrillation ([place]) 10/30/2015  . Bladder spasm 06/12/2013  . Carcinoma of prostate ([place]) 10/30/2015  . Cardiomyopathy ([place]) 09/10/2014   Overview:  EF 35%,4/15   . Cerebrovascular accident (CVA) ([place]) 10/30/2015   Overview:  Left embolic [id number]   . Difficult or painful urination 11/21/2013  . Dysrhythmia   . Gout 10/30/2015  . History of kidney stones   . Malignant neoplasm of prostate ([place]) 09/05/2012  . Sinoatrial node dysfunction ([facility]) 10/30/2015   Overview:  DDD pacemaker, 1996     Past Surgical History:  Procedure Laterality Date  . castration  10/2009  . CATARACT EXTRACTION W/ INTRAOCULAR LENS  IMPLANT, BILATERAL Bilateral 2016   right eye done then the left one done 3 months later in 2016  . COLONOSCOPY WITH PROPOFOL N/A 11/21/2019   Procedure: COLONOSCOPY WITH PROPOFOL;  Surgeon: Robert Bellow, MD;  Location: [facility] ENDOSCOPY;  Service: Endoscopy;  Laterality: N/A;  . INGUINAL HERNIA REPAIR Left   . INSERT / REPLACE / REMOVE PACEMAKER    . IR FLUORO GUIDE PORT INSERTION RIGHT  07/01/2017  . JOINT REPLACEMENT Bilateral 1980   hips  . left knee replacement  2014  . PACEMAKER INSERTION    . PROSTATECTOMY    . REPLACEMENT TOTAL HIP W/  RESURFACING IMPLANTS  1997   bilateral-     . REPLACEMENT TOTAL KNEE Bilateral   . testes removal     per pt   . TONSILLECTOMY AND ADENOIDECTOMY    . TRANSURETHRAL RESECTION OF BLADDER TUMOR N/A 12/31/2015   Procedure: BLADDER BIOPSY;  Surgeon: Nickie Retort, MD;  Location: [facility] ORS;  Service: Urology;  Laterality: N/A;  . Urinary incontinence valve     AMS    Allergies: Latex  Medications: Prior to Admission medications   Medication Sig Start Date End Date Taking? Authorizing Provider  acetaminophen (TYLENOL) 325 MG tablet Take 650 mg by mouth 2 (two) times daily.   Yes [provider]  allopurinol (ZYLOPRIM) 300 MG tablet Take by mouth daily. In am. 09/10/14  Yes [provider]  bisacodyl (DULCOLAX) 5 MG EC tablet Take 5 mg by mouth 2 (two) times a day.   Yes [provider]  calcium carbonate (CALCIUM 600) 600 MG TABS tablet Take 600 mg by mouth 2 (two) times daily with a meal.   Yes [provider]  cyanocobalamin (,VITAMIN B-12,) 1000 MCG/ML injection Inject 1,000 mcg into the muscle every 30 (thirty) days.  05/01/19  Yes [provider]  diltiazem (CARDIZEM CD) 180 MG 24 hr capsule Take 180 mg by mouth daily. 03/27/20  Yes [provider]  furosemide (LASIX) 20 MG tablet Take 20 mg by mouth daily. 03/25/20  Yes [provider]  phentermine 15 MG capsule Take 15 mg by mouth every morning.   Yes [provider]  potassium chloride (KLOR-CON) 10 MEQ tablet TAKE 2 TABLETS BY MOUTH ONCE  DAILY 03/03/20  Yes Sindy Guadeloupe, MD  predniSONE (DELTASONE) 5 MG tablet TAKE 1 TABLET (5 MG TOTAL) BY MOUTH 2 TIMES DAILY WITH A MEAL. 03/26/20  Yes Sindy Guadeloupe, MD  simvastatin (ZOCOR) 40 MG tablet Take 40 mg by mouth daily at 6 PM.  09/10/14  Yes [provider]  Abiraterone Acetate (ZYTIGA PO) Take 4 capsules by mouth daily.     [provider]  lidocaine-prilocaine (EMLA) cream  06/13/18   [provider]  warfarin (COUMADIN) 1 MG tablet Take  1 mg by mouth daily. 03/11/20   [provider]  warfarin (COUMADIN) 5 MG tablet  04/10/15   [provider]  warfarin (COUMADIN) 6 MG tablet Take 1 tablet by mouth. 03/20/20 03/20/21  [provider]  abiraterone acetate (ZYTIGA) 250 MG tablet TAKE 4 TABLETS (1,000 MG TOTAL) BY MOUTH DAILY. TAKE ON AN EMPTY STOMACH 1 HOUR BEFORE OR 2 HOURS AFTER A MEAL 04/26/19   Sindy Guadeloupe, MD  prochlorperazine (COMPAZINE) 10 MG tablet Take 1 tablet (10 mg total) by mouth every 6 (six) hours as needed (Nausea or vomiting). 06/21/17 01/16/19  Sindy Guadeloupe, MD     Family History  Problem Relation Age of Onset  . Cancer Mother   . Arthritis Mother   . Heart attack Father   . Arthritis Father   . Transient ischemic attack Brother   . Prostate cancer Neg Hx   . Bladder Cancer Neg Hx     Social History   Socioeconomic History  . Marital status: Married    Spouse name: Not on file  . Number of children: Not on file  . Years of education: Not on file  . Highest education level: Not on file  Occupational History  . Not on file  Tobacco Use  . Smoking status: Never Smoker  . Smokeless tobacco: Never Used  Substance and Sexual Activity  . Alcohol use: No    Alcohol/week: 0.0 standard drinks  . Drug use: No  . Sexual activity: Never  Other Topics Concern  . Not on file  Social History Narrative  . Not on file   Social Determinants of Health   Financial Resource Strain:   . Difficulty of Paying Living Expenses:   Food Insecurity:   . Worried About Charity fundraiser in the Last Year:   . Arboriculturist in the Last Year:   Transportation Needs:   . Film/video editor (Medical):   Marland Kitchen Lack of Transportation (Non-Medical):   Physical Activity:   . Days of Exercise per Week:   . Minutes of Exercise per Session:   Stress:   . Feeling of Stress :   Social Connections:   . Frequency of Communication with Friends and Family:   . Frequency of Social Gatherings with  Friends and Family:   . Attends Religious Services:   . Active Member of Clubs or Organizations:   . Attends Archivist Meetings:   Marland Kitchen Marital Status:     ECOG Status: 1 - Symptomatic but completely ambulatory  Review of Systems: A 12 point ROS discussed and pertinent positives are indicated in the HPI above.  All other systems are negative.  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 EXAM: CT CHEST, ABDOMEN, AND PELVIS WITH CONTRAST TECHNIQUE: Multidetector CT imaging of the chest, abdomen and pelvis was performed following the standard protocol during bolus administration of intravenous contrast. CONTRAST:  159mL OMNIPAQUE IOHEXOL 300 MG/ML  SOLN COMPARISON:  10/23/2019 CTs and bone scan. Clinic note of 03/07/2020 is reviewed. FINDINGS: CT CHEST FINDINGS Cardiovascular: Aortic atherosclerosis. Tortuous thoracic aorta. Moderate cardiomegaly with pacer. No pericardial effusion. Multivessel coronary artery atherosclerosis. No central pulmonary embolism, on this non-dedicated study. Right Port-A-Cath tip at high right atrium. Mediastinum/Nodes: Low left jugular/supraclavicular nodes of maximally 8 mm on 05/05 107, increased from 5 mm on the prior. AP window node measures 1.1 cm on 23/5 107 and is similar to on the prior. A node within the azygoesophageal recess measures 1.2 cm on 31/5 107 versus 1.1 cm on the prior. No hilar adenopathy. Right retrocrural node measures 8 mm on 57/507 versus 7 mm previously. Lungs/Pleura: No pleural fluid. Left greater than right base scarring. No suspicious pulmonary nodule or mass. Musculoskeletal: Degenerative changes of both shoulders. Remote right rib fractures. Marked  bilateral gynecomastia. Mild T3 compression deformity is not significantly changed. Sclerotic lesion involving the anterior aspect of the T4 vertebral body is similar at approximately 8 mm. Subtle sclerosis involving the right pedicle at T7 is similar including on sagittal image 125. Moderate T12 compression deformity is not significantly changed. CT ABDOMEN PELVIS FINDINGS Hepatobiliary: Contrast reflux into the IVC, suggesting elevated right heart pressures. No focal liver lesion. Normal gallbladder, without biliary ductal dilatation. Pancreas: Pancreatic atrophy without duct dilatation or acute inflammation. Spleen: Normal in size, without focal abnormality. Adrenals/Urinary Tract: Normal adrenal glands. Left extrarenal pelvis. Normal right kidney, without hydronephrosis. Degraded evaluation of the pelvis, secondary to beam hardening artifact from bilateral hip arthroplasty. No gross bladder abnormality. Stomach/Bowel: Normal stomach, without wall thickening. Normal colon and terminal ileum. Normal small bowel. Vascular/Lymphatic: Aortic and branch vessel atherosclerosis. Abdominal retroperitoneal adenopathy. A left periaortic nodal conglomerate measures 2.3 x 2.8 cm on 72/507 versus 2.1 x 1.8 cm on the prior exam (when remeasured). An index aortocaval node measures 1.3 cm on 78/507 versus 1.0 cm on the prior exam (when remeasured). Right common iliac node measures 1.2 cm on 94/507 versus 1.0 cm on the prior. Pelvic node dissection. Reproductive: Prostatectomy, with poor evaluation of the operative bed. Other: No gross free pelvic fluid. Fluid within the right inguinal canal is unchanged. Musculoskeletal: Bilateral hip arthroplasty. Osteopenia. Similar lucency adjacent the left hip arthroplasty acetabular component. IMPRESSION: 1. Progression of nodal metastasis within the abdomen and upper pelvis. 2. Similar to slight enlargement of thoracic nodes, for which metastatic disease cannot be excluded. Enlarging low  left jugular/supraclavicular nodes, also suspicious. 3. Similar osseous metastasis. 4. Degraded evaluation of the pelvis secondary to beam hardening artifact from bilateral hip arthroplasty. 5. Coronary artery atherosclerosis. Aortic Atherosclerosis (ICD10-I70.0). Electronically Signed   By: Abigail Miyamoto M.D.   On: 03/19/2020 12:15   NM Bone Scan Whole Body  Result Date: 03/19/2020 CLINICAL DATA:  History of prostate cancer. EXAM: NUCLEAR MEDICINE WHOLE BODY BONE SCAN TECHNIQUE: Whole body anterior and posterior images were obtained approximately 3 hours after intravenous injection of radiopharmaceutical. RADIOPHARMACEUTICALS:  20.953 mCi Technetium-16m MDP IV COMPARISON:  October 23, 2019 FINDINGS: A small focus of increased tracer activity is seen along the left parietal bone. This is unchanged  in appearance when compared to the prior study. Asymmetric increased tracer activity is again seen along the inferior aspect of the left scapula. The focal tracer uptake seen along the posterior aspect of the third right rib is no longer visualized. Very mild focal increased tracer uptake is seen at the approximate level of the T7 vertebral body on the right. This represents a new finding. Diffusely increased tracer activity is again seen within the bilateral feet, bilateral ankles and bilateral shoulders, left greater than right. Photopenic areas are seen involving both hips and both knees consistent with bilateral hip and bilateral knee replacements. A small focus of increased tracer activity is seen within the subcutaneous soft tissues along the lateral aspect of the right calf. This represents a new finding. Physiologic tracer uptake is seen within the bilateral kidneys and urinary bladder. IMPRESSION: 1. Predominant stable focal areas of increased tracer uptake involving the left parietal bone and inferior aspect of scapula on the left, with interval development of very mild focal increased tracer uptake at the  approximate level of the T7 vertebral body on the right. While the stable lesions likely represent stable metastatic foci, the new thoracic spine lesion is indeterminate in nature. A new small metastatic focus cannot be excluded. 2. Interval resolution of the focal tracer uptake seen along the posterior aspect of the third right rib on the prior exam. 3. Stable degenerative changes within the bilateral shoulders, bilateral ankles and bilateral feet. 4. Findings consistent with bilateral hip and bilateral knee replacements. 5. Focal superficial soft tissue uptake along the lateral aspect of the right calf. Correlation with physical examination of this region is recommended. Electronically Signed   By: Virgina Norfolk M.D.   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 EXAM: CT CHEST, ABDOMEN, AND PELVIS WITH CONTRAST TECHNIQUE: Multidetector CT imaging of the chest, abdomen and pelvis was performed following the standard protocol during bolus administration of intravenous contrast. CONTRAST:  158mL OMNIPAQUE IOHEXOL 300 MG/ML  SOLN COMPARISON:  10/23/2019 CTs and bone scan. Clinic note of 03/07/2020 is reviewed. FINDINGS: CT CHEST FINDINGS Cardiovascular: Aortic atherosclerosis. Tortuous thoracic aorta. Moderate cardiomegaly with pacer. No pericardial effusion. Multivessel coronary artery atherosclerosis. No central pulmonary embolism, on this non-dedicated study. Right Port-A-Cath tip at high right atrium. Mediastinum/Nodes: Low left jugular/supraclavicular nodes of maximally 8 mm on 05/05 107, increased from 5 mm on the prior. AP window node measures 1.1 cm on 23/5 107 and is similar to on the prior. A node within the azygoesophageal recess measures 1.2 cm on 31/5 107 versus 1.1 cm on the prior. No hilar adenopathy. Right retrocrural node measures 8 mm on 57/507 versus 7 mm previously.  Lungs/Pleura: No pleural fluid. Left greater than right base scarring. No suspicious pulmonary nodule or mass. Musculoskeletal: Degenerative changes of both shoulders. Remote right rib fractures. Marked bilateral gynecomastia. Mild T3 compression deformity is not significantly changed. Sclerotic lesion involving the anterior aspect of the T4 vertebral body is similar at approximately 8 mm. Subtle sclerosis involving the right pedicle at T7 is similar including on sagittal image 125. Moderate T12 compression deformity is not significantly changed. CT ABDOMEN PELVIS FINDINGS Hepatobiliary: Contrast reflux into the IVC, suggesting elevated right heart pressures. No focal liver lesion. Normal gallbladder, without biliary ductal dilatation. Pancreas: Pancreatic atrophy without duct dilatation or acute inflammation. Spleen: Normal in size, without focal abnormality. Adrenals/Urinary Tract: Normal adrenal  glands. Left extrarenal pelvis. Normal right kidney, without hydronephrosis. Degraded evaluation of the pelvis, secondary to beam hardening artifact from bilateral hip arthroplasty. No gross bladder abnormality. Stomach/Bowel: Normal stomach, without wall thickening. Normal colon and terminal ileum. Normal small bowel. Vascular/Lymphatic: Aortic and branch vessel atherosclerosis. Abdominal retroperitoneal adenopathy. A left periaortic nodal conglomerate measures 2.3 x 2.8 cm on 72/507 versus 2.1 x 1.8 cm on the prior exam (when remeasured). An index aortocaval node measures 1.3 cm on 78/507 versus 1.0 cm on the prior exam (when remeasured). Right common iliac node measures 1.2 cm on 94/507 versus 1.0 cm on the prior. Pelvic node dissection. Reproductive: Prostatectomy, with poor evaluation of the operative bed. Other: No gross free pelvic fluid. Fluid within the right inguinal canal is unchanged. Musculoskeletal: Bilateral hip arthroplasty. Osteopenia. Similar lucency adjacent the left hip arthroplasty acetabular  component. IMPRESSION: 1. Progression of nodal metastasis within the abdomen and upper pelvis. 2. Similar to slight enlargement of thoracic nodes, for which metastatic disease cannot be excluded. Enlarging low left jugular/supraclavicular nodes, also suspicious. 3. Similar osseous metastasis. 4. Degraded evaluation of the pelvis secondary to beam hardening artifact from bilateral hip arthroplasty. 5. Coronary artery atherosclerosis. Aortic Atherosclerosis (ICD10-I70.0). Electronically Signed   By: Abigail Miyamoto M.D.   On: 03/19/2020 12:15    Labs:  CBC: Recent Labs    02/05/20 1054 03/07/20 1140 04/04/20 1056 04/17/20 1003  WBC 5.1 4.1 5.4 5.1  HGB 11.5* 12.0* 12.4* 12.5*  HCT 35.4* 36.3* 37.7* 36.9*  PLT 122* 132* 139* 123*    COAGS: Recent Labs    04/17/20 1003  INR 1.1    BMP: Recent Labs    12/17/19 1435 02/05/20 1054 03/07/20 1140 04/04/20 1056  NA 139 138 139 139  K 3.8 3.4* 4.2 3.5  CL 103 102 103 105  CO2 28 25 25 24   GLUCOSE 129* 109* 100* 105*  BUN 34* 34* 34* 30*  CALCIUM 9.7 9.7 10.1 9.4  CREATININE 1.05 1.06 1.23 0.80  GFRNONAA >60 >60 59* >60  GFRAA >60 >60 >60 >60    LIVER FUNCTION TESTS: Recent Labs    12/17/19 1435 02/05/20 1054 03/07/20 1140 04/04/20 1056  BILITOT 1.3* 1.1 1.5* 1.7*  AST 17 21 24 19   ALT 11 12 15 15   ALKPHOS 53 52 55 57  PROT 6.8 6.8 6.6 6.9  ALBUMIN 4.1 4.1 4.2 4.3    TUMOR MARKERS: No results for input(s): AFPTM, CEA, CA199, CHROMGRNA in the last 8760 hours.  Assessment and Plan:  Jeremy Carney is a 74 y.o. male with past medical history significant for atrial fibrillation, cardiomyopathy, CVA, nephrolithiasis and prostate cancer, now with retroperitoneal lymphadenopathy worrisome for metastatic disease.  Patient presents today for CT-guided left-sided retroperitoneal lymph node biopsy.  Patient has baseline state of health and is currently without acute complaint.    Risks and benefits of CT-guided retroperitoneal  lymph node biopsy was discussed with the patient and/or patient's family including, but not limited to bleeding, infection, damage to adjacent structures or low yield requiring additional tests.  All of the questions were answered and there is agreement to proceed.  Consent signed and in chart.  Thank you for this interesting consult.  I greatly enjoyed meeting Jeremy Carney and look forward to participating in their care.  A copy of this report was sent to the requesting provider on this date.  Electronically Signed: Sandi Mariscal, MD 04/17/2020, 10:52 AM   I spent a total of 15 Minutes  in face to face in clinical consultation, greater than 50% of which was counseling/coordinating care for CT-guided retroperitoneal lymph node biopsy

## 2020-04-17 NOTE — Progress Notes (Signed)
Patient clinically stable post Retroperitoneal Biopsy per Dr Pascal Lux, tolerated well. Alert/awake and oriented post procedure. Vitals remained stable post biopsy. Posterior lumbar dresssing dry and intact. Denies complaints at this time. Received Versed 1mg  along with Fentanyl 100 mcg Iv for procedure.

## 2020-04-17 NOTE — Discharge Instructions (Signed)

## 2020-04-18 ENCOUNTER — Telehealth: Payer: Self-pay | Admitting: *Deleted

## 2020-04-18 NOTE — Telephone Encounter (Signed)
Called pt to ask about what his schedule is like, he goes places and stays several weeks. In order to do the provenge- he would need to have his calendar cleared about 13 weeks. I went over [facility] and being ev[facility]ated to see if he will need special catheter to get the aphresis and there are 3 treatments. Please call me back to speak about the treatment and his schedule

## 2020-04-21 DIAGNOSIS — I480 Paroxysmal atrial fibrillation: Secondary | ICD-10-CM | POA: Diagnosis not present

## 2020-04-21 LAB — SURGICAL PATHOLOGY

## 2020-04-24 ENCOUNTER — Encounter: Payer: Self-pay | Admitting: Oncology

## 2020-04-28 MED FILL — predniSONE 5 MG TABS: 5 | 30 days supply | Qty: 60 | Fill #1

## 2020-05-01 ENCOUNTER — Other Ambulatory Visit: Payer: Self-pay | Admitting: *Deleted

## 2020-05-01 DIAGNOSIS — C61 Malignant neoplasm of prostate: Secondary | ICD-10-CM

## 2020-05-02 ENCOUNTER — Inpatient Hospital Stay: Payer: PPO | Attending: Oncology

## 2020-05-02 ENCOUNTER — Other Ambulatory Visit: Payer: Self-pay

## 2020-05-02 ENCOUNTER — Inpatient Hospital Stay (HOSPITAL_BASED_OUTPATIENT_CLINIC_OR_DEPARTMENT_OTHER): Payer: PPO | Admitting: Oncology

## 2020-05-02 ENCOUNTER — Inpatient Hospital Stay: Payer: PPO

## 2020-05-02 ENCOUNTER — Encounter: Payer: Self-pay | Admitting: Oncology

## 2020-05-02 VITALS — BP 122/70 | HR 82 | Temp 98.1°F | Resp 18 | Wt 287.9 lb

## 2020-05-02 DIAGNOSIS — C7951 Secondary malignant neoplasm of bone: Secondary | ICD-10-CM | POA: Diagnosis not present

## 2020-05-02 DIAGNOSIS — Z9079 Acquired absence of other genital organ(s): Secondary | ICD-10-CM | POA: Insufficient documentation

## 2020-05-02 DIAGNOSIS — Z809 Family history of malignant neoplasm, unspecified: Secondary | ICD-10-CM | POA: Diagnosis not present

## 2020-05-02 DIAGNOSIS — C61 Malignant neoplasm of prostate: Secondary | ICD-10-CM

## 2020-05-02 DIAGNOSIS — E860 Dehydration: Secondary | ICD-10-CM

## 2020-05-02 DIAGNOSIS — C772 Secondary and unspecified malignant neoplasm of intra-abdominal lymph nodes: Secondary | ICD-10-CM

## 2020-05-02 DIAGNOSIS — Z79899 Other long term (current) drug therapy: Secondary | ICD-10-CM

## 2020-05-02 DIAGNOSIS — C778 Secondary and unspecified malignant neoplasm of lymph nodes of multiple regions: Secondary | ICD-10-CM | POA: Insufficient documentation

## 2020-05-02 DIAGNOSIS — Z7983 Long term (current) use of bisphosphonates: Secondary | ICD-10-CM

## 2020-05-02 DIAGNOSIS — Z7189 Other specified counseling: Secondary | ICD-10-CM

## 2020-05-02 LAB — CBC WITH DIFFERENTIAL/PLATELET
Abs Immature Granulocytes: 0.02 10*3/uL (ref 0.00–0.07)
Basophils Absolute: 0 10*3/uL (ref 0.0–0.1)
Basophils Relative: 0 %
Eosinophils Absolute: 0.1 10*3/uL (ref 0.0–0.5)
Eosinophils Relative: 2 %
HCT: 37.3 % — ABNORMAL LOW (ref 39.0–52.0)
Hemoglobin: 12.2 g/dL — ABNORMAL LOW (ref 13.0–17.0)
Immature Granulocytes: 0 %
Lymphocytes Relative: 16 %
Lymphs Abs: 0.9 10*3/uL (ref 0.7–4.0)
MCH: 30.5 pg (ref 26.0–34.0)
MCHC: 32.7 g/dL (ref 30.0–36.0)
MCV: 93.3 fL (ref 80.0–100.0)
Monocytes Absolute: 0.5 10*3/uL (ref 0.1–1.0)
Monocytes Relative: 9 %
Neutro Abs: 3.8 10*3/uL (ref 1.7–7.7)
Neutrophils Relative %: 73 %
Platelets: 144 10*3/uL — ABNORMAL LOW (ref 150–400)
RBC: 4 MIL/uL — ABNORMAL LOW (ref 4.22–5.81)
RDW: 14.9 % (ref 11.5–15.5)
WBC: 5.3 10*3/uL (ref 4.0–10.5)
nRBC: 0 % (ref 0.0–0.2)

## 2020-05-02 LAB — COMPREHENSIVE METABOLIC PANEL
ALT: 14 U/L (ref 0–44)
AST: 19 U/L (ref 15–41)
Albumin: 4.3 g/dL (ref 3.5–5.0)
Alkaline Phosphatase: 64 U/L (ref 38–126)
Anion gap: 10 (ref 5–15)
BUN: 33 mg/dL — ABNORMAL HIGH (ref 8–23)
CO2: 27 mmol/L (ref 22–32)
Calcium: 9.6 mg/dL (ref 8.9–10.3)
Chloride: 102 mmol/L (ref 98–111)
Creatinine, Ser: 1.02 mg/dL (ref 0.61–1.24)
GFR calc Af Amer: >60 mL/min (ref >60)
GFR calc non Af Amer: >60 mL/min (ref >60)
Glucose, Bld: 102 mg/dL — ABNORMAL HIGH (ref 70–99)
Potassium: 4.3 mmol/L (ref 3.5–5.1)
Sodium: 139 mmol/L (ref 135–145)
Total Bilirubin: 1.1 mg/dL (ref 0.3–1.2)
Total Protein: 7 g/dL (ref 6.5–8.1)

## 2020-05-02 LAB — PSA: Prostatic Specific Antigen: 38.59 ng/mL — ABNORMAL HIGH (ref 0.00–4.00)

## 2020-05-02 MED ORDER — DENOSUMAB 120 MG/1.7ML ~~LOC~~ SOLN
120.0000 mg | Freq: Once | SUBCUTANEOUS | Status: AC
  Administered 2020-05-02: 120 mg via SUBCUTANEOUS
  Filled 2020-05-02: qty 1.7

## 2020-05-02 NOTE — Progress Notes (Signed)
Pt in for follow up, denies any concerns today. 

## 2020-05-03 LAB — TESTOSTERONE: Testosterone: <3 ng/dL — ABNORMAL LOW (ref 264–916)

## 2020-05-05 MED FILL — ABIRATERONE ACETATE 250 MG: 250 | 30 days supply | Qty: 120 | Fill #3

## 2020-05-07 NOTE — Progress Notes (Signed)
Hematology/Oncology Consult note 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 . He again began to have a rising PSA in 2010 and underwent orchiectomy in 2010.He had biochemical recurrence in 2017 and was treated with IMRT  He underwent CT abdomen and pelvis in May 2018which showed abnormal periaortic and right common iliac lymph nodes. Index aortocaval lymph node 1.4 cm which was new as compared to February 2014 and suspicious for malignancy. Bone scan did not reveal any evidence of bony metastatic disease.PSA went up from 3.9 in January 20 18-7.8 in April 2018. 13.3 in July 2018 when docetaxel was initiated. Doubling time at that time was about 3 months  Docetaxel chemo initiated for CRPC with ln mets and rapid doubling PSA in august 2018.Patient completed 5 cycles of docetaxel on 09/27/2017. Cycle #6 was not given due to progressive fatigue and cytopenias as well as worsening neuropathy. Cycle 5 of docetaxel was dose reduced to 60 mg/m square.Patient never had normalization of his PSA following docetaxel and the lowest PSA after docetaxel was 5.64 in December 2018  In April 2019 patient noted to have rising PSAto 19.02and increase in the size of retroperitoneal LN. Patient therefore started second line zytiga.  Patient has remained on Zytiga since April 2019. The lowest PSA value on Zytiga was 6.1 in August 2020 and since then there has been a small but steady increase in  his PSA.Most recent imaging in November 2020 showed increase in the size of retroperitoneal lymph nodes from 0.7 cm to 1.1 cm. New periaortic lymph node 1.2 cm. Slight progression of sclerotic bone metastases involving the left scapula as well as T4 and T7. Most recent PSA on 03/07/2020 was 23.9.Given the subtle progression Zytiga was not changed at that time.  repat LN biopsy showed adenocarcinoa proatte. NGS pending  Interval history- patient currently feels well. Denies any complaints at this time. He has baseline neuropathy which is stable  ECOG PS- 1 Pain scale- 0   Review of systems- Review of Systems  Constitutional: Negative for chills, fever, malaise/fatigue and weight loss.  HENT: Negative for congestion, ear discharge and nosebleeds.   Eyes: Negative for blurred vision.  Respiratory: Negative for cough, hemoptysis, sputum production, shortness of breath and wheezing.   Cardiovascular: Negative for chest pain, palpitations, orthopnea and claudication.  Gastrointestinal: Negative for abdominal pain, blood in stool, constipation, diarrhea, heartburn, melena, nausea and vomiting.  Genitourinary: Negative for dysuria, flank pain, frequency, hematuria and urgency.  Musculoskeletal: Negative for back pain, joint pain and myalgias.  Skin: Negative for rash.  Neurological: Positive for sensory change (peripheral neuropathy). Negative for dizziness, tingling, focal weakness, seizures, weakness and headaches.  Endo/Heme/Allergies: Does not bruise/bleed easily.  Psychiatric/Behavioral: Negative for depression and suicidal ideas. The patient does not have insomnia.       Allergies  Allergen Reactions  . Latex Rash    Sensitivity, not allergy.     Past Medical History:  Diagnosis Date  . Arthritis, degenerative 10/30/2015  . Atrial fibrillation ([place]) 10/30/2015  . Bladder spasm  06/12/2013  . Carcinoma of prostate ([place]) 10/30/2015  . Cardiomyopathy ([place]) 09/10/2014   Overview:   EF 35%,4/15   . Cerebrovascular accident (CVA) ([place]) 10/30/2015   Overview:  Left embolic [id number]   . Difficult or painful urination 11/21/2013  . Dysrhythmia   . Gout 10/30/2015  . History of kidney stones   . Malignant neoplasm of prostate ([place]) 09/05/2012  . Sinoatrial node dysfunction ([facility]) 10/30/2015   Overview:  DDD pacemaker, 1996      Past Surgical History:  Procedure Laterality Date  . castration  10/2009  . CATARACT EXTRACTION W/ INTRAOCULAR LENS  IMPLANT, BILATERAL Bilateral 2016   right eye done then the left one done 3 months later in 2016  . COLONOSCOPY WITH PROPOFOL N/A 11/21/2019   Procedure: COLONOSCOPY WITH PROPOFOL;  Surgeon: Robert Bellow, MD;  Location: [facility] ENDOSCOPY;  Service: Endoscopy;  Laterality: N/A;  . INGUINAL HERNIA REPAIR Left   . INSERT / REPLACE / REMOVE PACEMAKER    . IR FLUORO GUIDE PORT INSERTION RIGHT  07/01/2017  . JOINT REPLACEMENT Bilateral 1980   hips  . left knee replacement  2014  . PACEMAKER INSERTION    . PROSTATECTOMY    . REPLACEMENT TOTAL HIP W/  RESURFACING IMPLANTS  1997   bilateral-   . REPLACEMENT TOTAL KNEE Bilateral   . testes removal     per pt   . TONSILLECTOMY AND ADENOIDECTOMY    . TRANSURETHRAL RESECTION OF BLADDER TUMOR N/A 12/31/2015   Procedure: BLADDER BIOPSY;  Surgeon: Nickie Retort, MD;  Location: [facility] ORS;  Service: Urology;  Laterality: N/A;  . Urinary incontinence valve     AMS    Social History   Socioeconomic History  . Marital status: Married    Spouse name: Not on file  . Number of children: Not on file  . Years of education: Not on file  . Highest education level: Not on file  Occupational History  . Not on file  Tobacco Use  . Smoking status: Never Smoker  . Smokeless tobacco: Never Used  Substance and Sexual Activity  . Alcohol use: No    Alcohol/week: 0.0 standard drinks  . Drug use: No  . Sexual activity: Never  Other Topics Concern  . Not on file  Social History Narrative    . Not on file   Social Determinants of Health   Financial Resource Strain:   . Difficulty of Paying Living Expenses:   Food Insecurity:   . Worried About Charity fundraiser in the Last Year:   . Arboriculturist in the Last Year:   Transportation Needs:   . Film/video editor (Medical):   Marland Kitchen Lack of Transportation (Non-Medical):   Physical Activity:   . Days of Exercise per Week:   . Minutes of Exercise per Session:   Stress:   . Feeling of Stress :   Social Connections:   . Frequency of Communication with Friends and Family:   . Frequency of Social Gatherings with Friends and Family:   . Attends Religious Services:   . Active Member of Clubs or Organizations:   . Attends Archivist Meetings:   Marland Kitchen Marital Status:   Intimate Partner Violence:   . Fear of Current or Ex-Partner:   . Emotionally Abused:   Marland Kitchen Physically Abused:   . Sexually Abused:     Family History  Problem Relation Age of Onset  . Cancer Mother   . Arthritis Mother   .  Heart attack Father   . Arthritis Father   . Transient ischemic attack Brother   . Prostate cancer Neg Hx   . Bladder Cancer Neg Hx      Current Outpatient Medications:  .  Abiraterone Acetate (ZYTIGA PO), Take 4 capsules by mouth daily. , Disp: , Rfl:  .  acetaminophen (TYLENOL) 325 MG tablet, Take 650 mg by mouth 2 (two) times daily., Disp: , Rfl:  .  allopurinol (ZYLOPRIM) 300 MG tablet, Take by mouth daily. In am., Disp: , Rfl:  .  bisacodyl (DULCOLAX) 5 MG EC tablet, Take 5 mg by mouth 2 (two) times a day., Disp: , Rfl:  .  calcium carbonate (CALCIUM 600) 600 MG TABS tablet, Take 600 mg by mouth 2 (two) times daily with a meal., Disp: , Rfl:  .  cyanocobalamin (,VITAMIN B-12,) 1000 MCG/ML injection, Inject 1,000 mcg into the muscle every 30 (thirty) days. , Disp: , Rfl:  .  diltiazem (CARDIZEM CD) 180 MG 24 hr capsule, Take 180 mg by mouth daily., Disp: , Rfl:  .  furosemide (LASIX) 20 MG tablet, Take 20 mg by mouth  daily., Disp: , Rfl:  .  lidocaine-prilocaine (EMLA) cream, , Disp: , Rfl:  .  phentermine 15 MG capsule, Take 15 mg by mouth every morning., Disp: , Rfl:  .  potassium chloride (KLOR-CON) 10 MEQ tablet, TAKE 2 TABLETS BY MOUTH ONCE DAILY, Disp: 60 tablet, Rfl: 2 .  predniSONE (DELTASONE) 5 MG tablet, TAKE 1 TABLET (5 MG TOTAL) BY MOUTH 2 TIMES DAILY WITH A MEAL., Disp: 60 tablet, Rfl: 5 .  simvastatin (ZOCOR) 40 MG tablet, Take 40 mg by mouth daily at 6 PM. , Disp: , Rfl:  .  warfarin (COUMADIN) 3 MG tablet, Take 3 mg by mouth daily. Takes on Tuesday and thursday, Disp: , Rfl:  .  warfarin (COUMADIN) 6 MG tablet, Take 1 tablet by mouth. Monday, wed, Friday, Saturday and Sunday, Disp: , Rfl:  No current facility-administered medications for this visit.  Facility-Administered Medications Ordered in Other Visits:  .  denosumab (XGEVA) injection 120 mg, 120 mg, Subcutaneous, Q28 days, Sindy Guadeloupe, MD, 120 mg at 09/07/19 1144 .  denosumab (XGEVA) injection 120 mg, 120 mg, Subcutaneous, Q28 days, Sindy Guadeloupe, MD, 120 mg at 11/15/19 1158  Physical exam:  Vitals:   05/02/20 1037  BP: 122/70  Pulse: 82  Resp: 18  Temp: 98.1 F (36.7 C)  TempSrc: Tympanic  SpO2: 97%  Weight: 287 lb 14.4 oz (130.6 kg)   Physical Exam Constitutional:      General: He is not in acute distress. Cardiovascular:     Rate and Rhythm: Normal rate and regular rhythm.     Heart sounds: Normal heart sounds.  Pulmonary:     Effort: Pulmonary effort is normal.     Breath sounds: Normal breath sounds.  Abdominal:     General: Bowel sounds are normal.     Palpations: Abdomen is soft.  Skin:    General: Skin is warm and dry.  Neurological:     Mental Status: He is alert and oriented to person, place, and time.      CMP Latest Ref Rng & Units 05/02/2020  Glucose 70 - 99 mg/dL 102(H)  BUN 8 - 23 mg/dL 33(H)  Creatinine 0.61 - 1.24 mg/dL 1.02  Sodium 135 - 145 mmol/L 139  Potassium 3.5 - 5.1 mmol/L 4.3    Chloride 98 - 111 mmol/L 102  CO2 22 -  32 mmol/L 27  Calcium 8.9 - 10.3 mg/dL 9.6  Total Protein 6.5 - 8.1 g/dL 7.0  Total Bilirubin 0.3 - 1.2 mg/dL 1.1  Alkaline Phos 38 - 126 U/L 64  AST 15 - 41 U/L 19  ALT 0 - 44 U/L 14   CBC Latest Ref Rng & Units 05/02/2020  WBC 4.0 - 10.5 K/uL 5.3  Hemoglobin 13.0 - 17.0 g/dL 12.2(L)  Hematocrit 39.0 - 52.0 % 37.3(L)  Platelets 150 - 400 K/uL 144(L)    No images are attached to the encounter.  CT Biopsy  Result Date: 04/17/2020 INDICATION: History of prostate cancer, with retroperitoneal lymphadenopathy worrisome for metastatic disease. Please perform CT-guided biopsy for tissue diagnostic purposes. EXAM: CT-GUIDED LEFT-SIDED RETROPERITONEAL NODAL CONGLOMERATION BIOPSY COMPARISON:  CT of the chest, abdomen and pelvis-03/19/2020 MEDICATIONS: None. ANESTHESIA/SEDATION: Fentanyl 100 mcg IV; Versed 1 mg IV Sedation time: 18 minutes; The patient was continuously monitored during the procedure by the interventional radiology nurse under my direct supervision. CONTRAST:  None. COMPLICATIONS: None immediate. PROCEDURE: Informed consent was obtained from the patient following an explanation of the procedure, risks, benefits and alternatives. A time out was performed prior to the initiation of the procedure. The patient was positioned left lateral decubitus on the CT table and a limited CT was performed for procedural planning demonstrating unchanged size and appearance of the approximately 3.4 x 2.6 cm left-sided retroperitoneal nodal conglomeration (image 17, series 4). The procedure was planned with special attention paid to avoid the left renal artery. The operative site was prepped and draped in the usual sterile fashion. Appropriate trajectory was confirmed with a 22 gauge spinal needle after the adjacent tissues were anesthetized with 1% Lidocaine with epinephrine. Under intermittent CT guidance, a 17 gauge coaxial needle was advanced into the peripheral  aspect of the mass. Appropriate positioning was confirmed and 6 core needle biopsy samples were obtained with an 18 gauge core needle biopsy device. The co-axial needle was removed following the administration of a Gel-Foam slurry and superficial hemostasis was achieved with manual compression. A limited postprocedural CT was negative for hemorrhage or additional complication. A dressing was placed. The patient tolerated the procedure well without immediate postprocedural complication. IMPRESSION: Technically successful CT guided core needle biopsy of dominant left-sided retroperitoneal nodal conglomeration. Electronically Signed   By: Sandi Mariscal M.D.   On: 04/17/2020 13:47     Assessment and plan- Patient is a 74 y.o. male with castrate resistant metastatic prostate cancer currently on Zytiga and prednisone.rising PSA and CT scans showing mild progression  Patient went for second opinion at [facility] and saw Dr. Roosevelt Locks.plan was to continue zytiga and proceed with provenge.  Patient understands that provenge has not been shown to affect PSA but has shown to improve OS in [facility]PC. Patient is in the process of a 3 week out of town visit.   We will plan to get him an initial consult with [facility] after his insurance approves provenge. They will determine if he needs a pheresis catheter or not. He will need to undergo 3 rounds of pheresis 2 weeks apart for 3 doses. Autologous CD5$+ cells will be activated with PAP-GM-CSF and admisnisted as a scheduled infusion.  Discussed risks and benefits of Provenge including all but not limited to fatigue, headache, dizziness, chills, nausea vomiting and constipation, myalgias and other medical problems.  Treatment will be given with a palliative intent patient understands and agrees to proceed as planned.  Patient will receive Xgeva to 06/17/2020 for next  dose of Xgeva and to receive his first dose of Provenge.  I will check CBC with differential CMP and PSA on that day    Visit Diagnosis 1. Malignant neoplasm of prostate ([place])   2. Goals of care, counseling/discussion   3. High risk medication use   4. Long term (current) use of bisphosphonates      Dr. Randa Evens, MD, MPH [facility] at [facility] [id number] 05/07/2020 8:32 AM

## 2020-05-14 ENCOUNTER — Encounter: Payer: Self-pay | Admitting: Oncology

## 2020-05-14 DIAGNOSIS — I480 Paroxysmal atrial fibrillation: Secondary | ICD-10-CM | POA: Diagnosis not present

## 2020-05-15 ENCOUNTER — Telehealth: Payer: Self-pay | Admitting: *Deleted

## 2020-05-15 NOTE — Telephone Encounter (Signed)
Wife called to say should pt be quarantined when he starts provenge. I checked with Provenge team and the answer is no unless he gets covid or anyone with him gets covid. But any time getting treatment-he should wash his hands before eating and after going to bathroom. Also do not allow visitors to come if they are sick or running fever. She states they do  That now. Also she wanted to know if pt should drive himself. I will call her back with that. I will check with the provenge team. Wife is good with this

## 2020-05-29 ENCOUNTER — Telehealth: Payer: Self-pay | Admitting: *Deleted

## 2020-05-29 NOTE — Telephone Encounter (Signed)
Called [place] [facility] in order to get approval for Dr. Saralyn Pilar you to let us take Coumadin off for 5 days prior to having patient's procedure.  I have also called the patient and left him a message so that we can go over the left over things to take care of him in order to get the Provenge started as well as going over the amount of money that it will cost.  And talking to him about foundations to help pay for the Provenge.  Gave patient my direct phone number to call me back

## 2020-06-02 ENCOUNTER — Inpatient Hospital Stay: Payer: PPO

## 2020-06-02 ENCOUNTER — Inpatient Hospital Stay: Payer: PPO | Attending: Oncology

## 2020-06-02 ENCOUNTER — Other Ambulatory Visit: Payer: Self-pay | Admitting: *Deleted

## 2020-06-02 ENCOUNTER — Other Ambulatory Visit: Payer: Self-pay | Admitting: Oncology

## 2020-06-02 ENCOUNTER — Other Ambulatory Visit: Payer: Self-pay

## 2020-06-02 DIAGNOSIS — Z809 Family history of malignant neoplasm, unspecified: Secondary | ICD-10-CM | POA: Insufficient documentation

## 2020-06-02 DIAGNOSIS — C61 Malignant neoplasm of prostate: Secondary | ICD-10-CM

## 2020-06-02 DIAGNOSIS — C7951 Secondary malignant neoplasm of bone: Secondary | ICD-10-CM | POA: Insufficient documentation

## 2020-06-02 DIAGNOSIS — Z9079 Acquired absence of other genital organ(s): Secondary | ICD-10-CM | POA: Diagnosis not present

## 2020-06-02 DIAGNOSIS — C778 Secondary and unspecified malignant neoplasm of lymph nodes of multiple regions: Secondary | ICD-10-CM | POA: Insufficient documentation

## 2020-06-02 DIAGNOSIS — E860 Dehydration: Secondary | ICD-10-CM

## 2020-06-02 LAB — COMPREHENSIVE METABOLIC PANEL
ALT: 14 U/L (ref 0–44)
AST: 22 U/L (ref 15–41)
Albumin: 4.7 g/dL (ref 3.5–5.0)
Alkaline Phosphatase: 71 U/L (ref 38–126)
Anion gap: 12 (ref 5–15)
BUN: 32 mg/dL — ABNORMAL HIGH (ref 8–23)
CO2: 26 mmol/L (ref 22–32)
Calcium: 9.7 mg/dL (ref 8.9–10.3)
Chloride: 103 mmol/L (ref 98–111)
Creatinine, Ser: 1.24 mg/dL (ref 0.61–1.24)
GFR calc Af Amer: >60 mL/min (ref >60)
GFR calc non Af Amer: 58 mL/min — ABNORMAL LOW (ref >60)
Glucose, Bld: 88 mg/dL (ref 70–99)
Potassium: 4 mmol/L (ref 3.5–5.1)
Sodium: 141 mmol/L (ref 135–145)
Total Bilirubin: 1.3 mg/dL — ABNORMAL HIGH (ref 0.3–1.2)
Total Protein: 7.7 g/dL (ref 6.5–8.1)

## 2020-06-02 MED ORDER — DENOSUMAB 120 MG/1.7ML ~~LOC~~ SOLN
120.0000 mg | Freq: Once | SUBCUTANEOUS | Status: AC
  Administered 2020-06-02: 120 mg via SUBCUTANEOUS
  Filled 2020-06-02: qty 1.7

## 2020-06-02 MED FILL — predniSONE 5 MG TABS: 5 | 30 days supply | Qty: 60 | Fill #2

## 2020-06-04 ENCOUNTER — Other Ambulatory Visit: Payer: Self-pay | Admitting: *Deleted

## 2020-06-04 DIAGNOSIS — C772 Secondary and unspecified malignant neoplasm of intra-abdominal lymph nodes: Secondary | ICD-10-CM

## 2020-06-04 DIAGNOSIS — C61 Malignant neoplasm of prostate: Secondary | ICD-10-CM

## 2020-06-04 MED FILL — ABIRATERONE ACETATE 250 MG: 250 | 30 days supply | Qty: 120 | Fill #0

## 2020-06-04 NOTE — Progress Notes (Signed)
Putting in orders for pt to have port removed and to have apheresis catheter placed for him to get provenge.

## 2020-06-05 ENCOUNTER — Other Ambulatory Visit: Payer: Self-pay | Admitting: Oncology

## 2020-06-05 ENCOUNTER — Telehealth: Payer: Self-pay | Admitting: *Deleted

## 2020-06-05 DIAGNOSIS — M7989 Other specified soft tissue disorders: Secondary | ICD-10-CM

## 2020-06-05 DIAGNOSIS — C61 Malignant neoplasm of prostate: Secondary | ICD-10-CM

## 2020-06-05 NOTE — Telephone Encounter (Signed)
Called and spoke to pt and let him know that we have got permission to come off coumadin for 5 days. I have sent order for to take out portacath and to insert apheresis catheter for the provenge infusion.  The date is for 7/2 for arrival of 7:30 am and procedure 8:30.  Pt to have a driver to take him home. Nothing to eat or drink after going to bed on Thursday night. Stop coumadin after Saturday evening dose. So no coumadin sun, mon, tues, wed, thurs., and Friday. But after the procedure on Friday can restart coumadin that evening.. We have staff that is approved to use the apheresis catheter and I will be talking with provenge staff to set up the [facility] visit to take his blood and then first visit for provenge at cancer center. Pt agreeable to all above

## 2020-06-10 ENCOUNTER — Other Ambulatory Visit: Payer: Self-pay | Admitting: Oncology

## 2020-06-10 ENCOUNTER — Telehealth: Payer: Self-pay | Admitting: *Deleted

## 2020-06-10 NOTE — Telephone Encounter (Signed)
I then called the number for provenge to get the infusions set up and was told the earliest appt for the leukophoresis was 7/13. So then I called pt back and told him that I would need to call next week and change the catheter placement and call him back so do  Not stop coumadin until he hears from me. This documentation is from Friday 6/25 after hours and my computer was not connecting to epic so it is after date documenting.pt was aware of this on 6/25 when I called him back

## 2020-06-10 NOTE — Telephone Encounter (Signed)
I called specials and let them know that the date of catheter will need to be moved closer to the 7/13 date and they will put it in on 7/9 for 9:30 arrival and 10:30 procedure. Patient was contacted by me today and told the date above. . Pt already knows the instructions. He also knows to stop the coumadin on 7/4. And to start it back after procedure 7/9. I told him that vocki from IR will call him before procedure and go over instructions again. Also she said she may change the time but not the date for catheter to be placed. Also Dr. Janese Banks is sending a message to the date of infusion and they will call him about that from infusion. Pt understands all of this.

## 2020-06-13 ENCOUNTER — Ambulatory Visit: Payer: PPO

## 2020-06-17 ENCOUNTER — Inpatient Hospital Stay: Payer: PPO

## 2020-06-17 ENCOUNTER — Inpatient Hospital Stay: Payer: PPO | Admitting: Oncology

## 2020-06-17 ENCOUNTER — Ambulatory Visit: Payer: PPO

## 2020-06-17 NOTE — Progress Notes (Signed)
Patient on schedule for port removal along with aphoresis catheter placement(dialysis catheter) called to patient with pre procedure instructions given. Made aware to be here @ 0930, NPO after [place] as well as have driver for home post procedure/discharge. Stated understanding.

## 2020-06-19 ENCOUNTER — Other Ambulatory Visit: Payer: Self-pay | Admitting: Student

## 2020-06-19 ENCOUNTER — Other Ambulatory Visit: Payer: Self-pay

## 2020-06-19 ENCOUNTER — Inpatient Hospital Stay: Payer: PPO | Attending: Oncology

## 2020-06-19 DIAGNOSIS — Z9079 Acquired absence of other genital organ(s): Secondary | ICD-10-CM | POA: Diagnosis not present

## 2020-06-19 DIAGNOSIS — Z5111 Encounter for antineoplastic chemotherapy: Secondary | ICD-10-CM | POA: Diagnosis not present

## 2020-06-19 DIAGNOSIS — Z7189 Other specified counseling: Secondary | ICD-10-CM

## 2020-06-19 DIAGNOSIS — C7951 Secondary malignant neoplasm of bone: Secondary | ICD-10-CM | POA: Diagnosis not present

## 2020-06-19 DIAGNOSIS — Z7952 Long term (current) use of systemic steroids: Secondary | ICD-10-CM | POA: Insufficient documentation

## 2020-06-19 DIAGNOSIS — C61 Malignant neoplasm of prostate: Secondary | ICD-10-CM | POA: Diagnosis not present

## 2020-06-19 DIAGNOSIS — Z809 Family history of malignant neoplasm, unspecified: Secondary | ICD-10-CM | POA: Diagnosis not present

## 2020-06-19 LAB — COMPREHENSIVE METABOLIC PANEL
ALT: 13 U/L (ref 0–44)
AST: 18 U/L (ref 15–41)
Albumin: 4.4 g/dL (ref 3.5–5.0)
Alkaline Phosphatase: 68 U/L (ref 38–126)
Anion gap: 11 (ref 5–15)
BUN: 35 mg/dL — ABNORMAL HIGH (ref 8–23)
CO2: 26 mmol/L (ref 22–32)
Calcium: 9.6 mg/dL (ref 8.9–10.3)
Chloride: 104 mmol/L (ref 98–111)
Creatinine, Ser: 1 mg/dL (ref 0.61–1.24)
GFR calc Af Amer: >60 mL/min (ref >60)
GFR calc non Af Amer: >60 mL/min (ref >60)
Glucose, Bld: 107 mg/dL — ABNORMAL HIGH (ref 70–99)
Potassium: 4.2 mmol/L (ref 3.5–5.1)
Sodium: 141 mmol/L (ref 135–145)
Total Bilirubin: 1.1 mg/dL (ref 0.3–1.2)
Total Protein: 7.5 g/dL (ref 6.5–8.1)

## 2020-06-19 LAB — CBC WITH DIFFERENTIAL/PLATELET
Abs Immature Granulocytes: 0.03 10*3/uL (ref 0.00–0.07)
Basophils Absolute: 0 10*3/uL (ref 0.0–0.1)
Basophils Relative: 0 %
Eosinophils Absolute: 0.1 10*3/uL (ref 0.0–0.5)
Eosinophils Relative: 2 %
HCT: 38.5 % — ABNORMAL LOW (ref 39.0–52.0)
Hemoglobin: 13.1 g/dL (ref 13.0–17.0)
Immature Granulocytes: 1 %
Lymphocytes Relative: 19 %
Lymphs Abs: 1.2 10*3/uL (ref 0.7–4.0)
MCH: 31.2 pg (ref 26.0–34.0)
MCHC: 34 g/dL (ref 30.0–36.0)
MCV: 91.7 fL (ref 80.0–100.0)
Monocytes Absolute: 0.6 10*3/uL (ref 0.1–1.0)
Monocytes Relative: 9 %
Neutro Abs: 4.3 10*3/uL (ref 1.7–7.7)
Neutrophils Relative %: 69 %
Platelets: 151 10*3/uL (ref 150–400)
RBC: 4.2 MIL/uL — ABNORMAL LOW (ref 4.22–5.81)
RDW: 15.4 % (ref 11.5–15.5)
WBC: 6.3 10*3/uL (ref 4.0–10.5)
nRBC: 0 % (ref 0.0–0.2)

## 2020-06-19 LAB — PSA: Prostatic Specific Antigen: 63.93 ng/mL — ABNORMAL HIGH (ref 0.00–4.00)

## 2020-06-20 ENCOUNTER — Ambulatory Visit
Admission: RE | Admit: 2020-06-20 | Discharge: 2020-06-20 | Disposition: A | Discharge status: Home / Self Care | Payer: PPO | Source: Ambulatory Visit | Attending: Oncology | Admitting: Oncology

## 2020-06-20 DIAGNOSIS — Z79899 Other long term (current) drug therapy: Secondary | ICD-10-CM | POA: Diagnosis not present

## 2020-06-20 DIAGNOSIS — M1990 Unspecified osteoarthritis, unspecified site: Secondary | ICD-10-CM | POA: Insufficient documentation

## 2020-06-20 DIAGNOSIS — I4891 Unspecified atrial fibrillation: Secondary | ICD-10-CM | POA: Insufficient documentation

## 2020-06-20 DIAGNOSIS — M109 Gout, unspecified: Secondary | ICD-10-CM | POA: Diagnosis not present

## 2020-06-20 DIAGNOSIS — Z96653 Presence of artificial knee joint, bilateral: Secondary | ICD-10-CM | POA: Diagnosis not present

## 2020-06-20 DIAGNOSIS — C61 Malignant neoplasm of prostate: Secondary | ICD-10-CM

## 2020-06-20 DIAGNOSIS — Z7901 Long term (current) use of anticoagulants: Secondary | ICD-10-CM | POA: Diagnosis not present

## 2020-06-20 DIAGNOSIS — Z8673 Personal history of transient ischemic attack (TIA), and cerebral infarction without residual deficits: Secondary | ICD-10-CM | POA: Diagnosis not present

## 2020-06-20 DIAGNOSIS — Z4901 Encounter for fitting and adjustment of extracorporeal dialysis catheter: Secondary | ICD-10-CM | POA: Diagnosis not present

## 2020-06-20 DIAGNOSIS — M7989 Other specified soft tissue disorders: Secondary | ICD-10-CM

## 2020-06-20 DIAGNOSIS — C772 Secondary and unspecified malignant neoplasm of intra-abdominal lymph nodes: Secondary | ICD-10-CM

## 2020-06-20 DIAGNOSIS — I429 Cardiomyopathy, unspecified: Secondary | ICD-10-CM | POA: Insufficient documentation

## 2020-06-20 DIAGNOSIS — Z91040 Latex allergy status: Secondary | ICD-10-CM | POA: Insufficient documentation

## 2020-06-20 HISTORY — PX: IR REMOVAL TUN ACCESS W/ PORT W/O FL MOD SED: IMG2290

## 2020-06-20 LAB — PROTIME-INR
INR: 1.1 (ref 0.8–1.2)
Prothrombin Time: 14 seconds (ref 11.4–15.2)

## 2020-06-20 MED ORDER — CEFAZOLIN SODIUM-DEXTROSE 2-4 GM/100ML-% IV SOLN
INTRAVENOUS | Status: AC
  Administered 2020-06-20: 2 g via INTRAVENOUS
  Filled 2020-06-20: qty 100

## 2020-06-20 MED ORDER — MIDAZOLAM HCL 2 MG/2ML IJ SOLN
INTRAMUSCULAR | Status: AC | PRN
  Administered 2020-06-20: 1 mg via INTRAVENOUS

## 2020-06-20 MED ORDER — MIDAZOLAM HCL 2 MG/2ML IJ SOLN
INTRAMUSCULAR | Status: AC
  Filled 2020-06-20: qty 2

## 2020-06-20 MED ORDER — FENTANYL CITRATE (PF) 100 MCG/2ML IJ SOLN
INTRAMUSCULAR | Status: AC
  Filled 2020-06-20: qty 2

## 2020-06-20 MED ORDER — FENTANYL CITRATE (PF) 100 MCG/2ML IJ SOLN
INTRAMUSCULAR | Status: AC | PRN
  Administered 2020-06-20: 50 ug via INTRAVENOUS

## 2020-06-20 MED ORDER — CEFAZOLIN SODIUM-DEXTROSE 2-4 GM/100ML-% IV SOLN
2.0000 g | Freq: Once | INTRAVENOUS | Status: AC
  Filled 2020-06-20: qty 100

## 2020-06-20 MED ORDER — SODIUM CHLORIDE 0.9 % IV SOLN: INTRAVENOUS | Status: DC

## 2020-06-20 MED ORDER — LIDOCAINE-EPINEPHRINE (PF) 1 %-1:200000 IJ SOLN
INTRAMUSCULAR | Status: AC
  Filled 2020-06-20: qty 10

## 2020-06-20 MED ORDER — LIDOCAINE HCL (PF) 1 % IJ SOLN
INTRAMUSCULAR | Status: AC
  Filled 2020-06-20: qty 30

## 2020-06-20 NOTE — H&P (Signed)
Referring Physician(s): Sindy Guadeloupe  Supervising Physician: Jacqulynn Cadet  Patient Status:  [facility]  Chief Complaint:  "I'm getting my port out and another catheter put in"  Subjective: Patient familiar to IR service from Port-A-Cath placement in 2018 and left retroperitoneal lymph node biopsy on 04/17/2020.  He has a history of progressive metastatic prostate cancer and presents today for Port-A-Cath removal followed by apheresis catheter placement to assist with Provenge infusions.  He currently denies fever, headache, chest pain, dyspnea, cough, abdominal/back pain, nausea, vomiting or bleeding.  Additional history as below.  Past Medical History:  Diagnosis Date  . Arthritis, degenerative 10/30/2015  . Atrial fibrillation ([place]) 10/30/2015  . Bladder spasm 06/12/2013  . Carcinoma of prostate ([place]) 10/30/2015  . Cardiomyopathy ([place]) 09/10/2014   Overview:  EF 35%,4/15   . Cerebrovascular accident (CVA) ([place]) 10/30/2015   Overview:  Left embolic WNU,2725   . Difficult or painful urination 11/21/2013  . Dysrhythmia   . Gout 10/30/2015  . History of kidney stones   . Malignant neoplasm of prostate ([place]) 09/05/2012  . Sinoatrial node dysfunction ([facility]) 10/30/2015   Overview:  DDD pacemaker, 1996    Past Surgical History:  Procedure Laterality Date  . castration  10/2009  . CATARACT EXTRACTION W/ INTRAOCULAR LENS  IMPLANT, BILATERAL Bilateral 2016   right eye done then the left one done 3 months later in 2016  . COLONOSCOPY WITH PROPOFOL N/A 11/21/2019   Procedure: COLONOSCOPY WITH PROPOFOL;  Surgeon: Robert Bellow, MD;  Location: [facility] ENDOSCOPY;  Service: Endoscopy;  Laterality: N/A;  . INGUINAL HERNIA REPAIR Left   . INSERT / REPLACE / REMOVE PACEMAKER    . IR FLUORO GUIDE PORT INSERTION RIGHT  07/01/2017  . JOINT REPLACEMENT Bilateral 1980   hips  . left knee replacement  2014  . PACEMAKER INSERTION    . PROSTATECTOMY    . REPLACEMENT TOTAL HIP W/  RESURFACING  IMPLANTS  1997   bilateral-   . REPLACEMENT TOTAL KNEE Bilateral   . testes removal     per pt   . TONSILLECTOMY AND ADENOIDECTOMY    . TRANSURETHRAL RESECTION OF BLADDER TUMOR N/A 12/31/2015   Procedure: BLADDER BIOPSY;  Surgeon: Nickie Retort, MD;  Location: [facility] ORS;  Service: Urology;  Laterality: N/A;  . Urinary incontinence valve     AMS     Allergies: Latex  Medications: Prior to Admission medications   Medication Sig Start Date End Date Taking? Authorizing Provider  Abiraterone Acetate (ZYTIGA PO) Take 4 capsules by mouth daily.    Yes [provider]  abiraterone acetate (ZYTIGA) 250 MG tablet TAKE 4 TABLETS (1,000 MG TOTAL) BY MOUTH DAILY. TAKE ON AN EMPTY STOMACH 1 HOUR BEFORE OR 2 HOURS AFTER A MEAL 06/02/20 07/02/20 Yes Sindy Guadeloupe, MD  acetaminophen (TYLENOL) 325 MG tablet Take 650 mg by mouth 2 (two) times daily.   Yes [provider]  allopurinol (ZYLOPRIM) 300 MG tablet Take by mouth daily. In am. 09/10/14  Yes [provider]  bisacodyl (DULCOLAX) 5 MG EC tablet Take 5 mg by mouth 2 (two) times a day.   Yes [provider]  calcium carbonate (CALCIUM 600) 600 MG TABS tablet Take 600 mg by mouth 2 (two) times daily with a meal.   Yes [provider]  cyanocobalamin (,VITAMIN B-12,) 1000 MCG/ML injection Inject 1,000 mcg into the muscle every 30 (thirty) days.  05/01/19  Yes [provider]  diltiazem (CARDIZEM CD)  180 MG 24 hr capsule Take 180 mg by mouth daily. 03/27/20  Yes [provider]  furosemide (LASIX) 20 MG tablet Take 20 mg by mouth daily. 03/25/20  Yes [provider]  lidocaine-prilocaine (EMLA) cream  06/13/18  Yes [provider]  phentermine 15 MG capsule Take 15 mg by mouth every morning.   Yes [provider]  potassium chloride (KLOR-CON) 10 MEQ tablet TAKE 2 TABLETS BY MOUTH ONCE DAILY 03/03/20  Yes Sindy Guadeloupe, MD  predniSONE (DELTASONE) 5 MG tablet TAKE 1  TABLET (5 MG TOTAL) BY MOUTH 2 TIMES DAILY WITH A MEAL. 03/26/20  Yes Sindy Guadeloupe, MD  simvastatin (ZOCOR) 40 MG tablet Take 40 mg by mouth daily at 6 PM.  09/10/14  Yes [provider]  warfarin (COUMADIN) 3 MG tablet Take 3 mg by mouth daily. Takes on Tuesday and thursday    [provider]  warfarin (COUMADIN) 6 MG tablet Take 1 tablet by mouth. Monday, wed, Friday, Saturday and Sunday 03/20/20 03/20/21  [provider]  abiraterone acetate (ZYTIGA) 250 MG tablet TAKE 4 TABLETS (1,000 MG TOTAL) BY MOUTH DAILY. TAKE ON AN EMPTY STOMACH 1 HOUR BEFORE OR 2 HOURS AFTER A MEAL 04/26/19   Sindy Guadeloupe, MD  prochlorperazine (COMPAZINE) 10 MG tablet Take 1 tablet (10 mg total) by mouth every 6 (six) hours as needed (Nausea or vomiting). 06/21/17 01/16/19  Sindy Guadeloupe, MD     Vital Signs: BP (!) 166/126   Pulse 100   Temp 97.8 F (36.6 C) (Oral)   Resp 19   Ht 6' (1.829 m)   Wt 290 lb (131.5 kg)   SpO2 100%   BMI 39.33 kg/m   Physical Exam awake, alert.  Chest clear to auscultation bilaterally.  Clean, intact right chest wall Port-A-Cath.  Left chest wall pacemaker.  Heart with irregularly irregular rhythm.  Abdomen soft, positive bowel sounds, nontender.  Bilateral lower extremity edema noted.  Imaging: No results found.  Labs:  CBC: Recent Labs    04/04/20 1056 04/17/20 1003 05/02/20 0955 06/19/20 1616  WBC 5.4 5.1 5.3 6.3  HGB 12.4* 12.5* 12.2* 13.1  HCT 37.7* 36.9* 37.3* 38.5*  PLT 139* 123* 144* 151    COAGS: Recent Labs    04/17/20 1003  INR 1.1    BMP: Recent Labs    04/04/20 1056 05/02/20 0955 06/02/20 1514 06/19/20 1616  NA 139 139 141 141  K 3.5 4.3 4.0 4.2  CL 105 102 103 104  CO2 24 27 26 26   GLUCOSE 105* 102* 88 107*  BUN 30* 33* 32* 35*  CALCIUM 9.4 9.6 9.7 9.6  CREATININE 0.80 1.02 1.24 1.00  GFRNONAA >60 >60 58* >60  GFRAA >60 >60 >60 >60    LIVER FUNCTION TESTS: Recent Labs    04/04/20 1056 05/02/20 0955  06/02/20 1514 06/19/20 1616  BILITOT 1.7* 1.1 1.3* 1.1  AST 19 19 22 18   ALT 15 14 14 13   ALKPHOS 57 64 71 68  PROT 6.9 7.0 7.7 7.5  ALBUMIN 4.3 4.3 4.7 4.4    Assessment and Plan: 74 yo male with history of progressive metastatic prostate cancer ;presents today for Port-A-Cath removal followed by  apheresis catheter placement to assist with Provenge infusions.  Details/risks of procedures, including but not limited to, internal bleeding, infection, injury to adjacent structures discussed with patient with his understanding and consent.  He has been off Coumadin since 7/4.   Electronically Signed: D.  Rowe Robert, PA-C 06/20/2020, 9:41 AM   I spent a total of 25 minutes at the the patient's bedside AND on the patient's hospital floor or unit, greater than 50% of which was counseling/coordinating care for Port-A-Cath removal/placement of apheresis/central venous catheter

## 2020-06-20 NOTE — Procedures (Signed)
Interventional Radiology Procedure Note  Procedure:  1.) Removal of right chest portacatheter 2.) Placement of right IJ 19 cm Palindrome tunneled pheresis/HD catheter  Complications: None  Estimated Blood Loss: None  Recommendations: - Bedrest with HOB elevated x 1 hr - DC home   Signed,  Criselda Peaches, MD

## 2020-06-20 NOTE — Discharge Instructions (Signed)
Implanted Port Removal, Care After This sheet gives you information about how to care for yourself after your procedure. Your health care provider may also give you more specific instructions. If you have problems or questions, contact your health care provider. What can I expect after the procedure? After the procedure, it is common to have:  Soreness or pain near your incision.  Some swelling or bruising near your incision. Follow these instructions at home: Medicines  Take over-the-counter and prescription medicines only as told by your health care provider.  If you were prescribed an antibiotic medicine, take it as told by your health care provider. Do not stop taking the antibiotic even if you start to feel better. Bathing  Do not take baths, swim, or use a hot tub until your health care provider approves. Ask your health care provider if you can take showers. You may only be allowed to take sponge baths. Incision care   Follow instructions from your health care provider about how to take care of your incision. Make sure you: ? Wash your hands with soap and water before you change your bandage (dressing). If soap and water are not available, use hand sanitizer. ? Change your dressing as told by your health care provider. ? Keep your dressing dry. ? Leave stitches (sutures), skin glue, or adhesive strips in place. These skin closures may need to stay in place for 2 weeks or longer. If adhesive strip edges start to loosen and curl up, you may trim the loose edges. Do not remove adhesive strips completely unless your health care provider tells you to do that.  Check your incision area every day for signs of infection. Check for: ? More redness, swelling, or pain. ? More fluid or blood. ? Warmth. ? Pus or a bad smell. Driving   Do not drive for 24 hours if you were given a medicine to help you relax (sedative) during your procedure.  If you did not receive a sedative, ask your  health care provider when it is safe to drive. Activity  Return to your normal activities as told by your health care provider. Ask your health care provider what activities are safe for you.  Do not lift anything that is heavier than 10 lb (4.5 kg), or the limit that you are told, until your health care provider says that it is safe.  Do not do activities that involve lifting your arms over your head. General instructions  Do not use any products that contain nicotine or tobacco, such as cigarettes and e-cigarettes. These can delay healing. If you need help quitting, ask your health care provider.  Keep all follow-up visits as told by your health care provider. This is important. Contact a health care provider if:  You have more redness, swelling, or pain around your incision.  You have more fluid or blood coming from your incision.  Your incision feels warm to the touch.  You have pus or a bad smell coming from your incision.  You have pain that is not relieved by your pain medicine. Get help right away if you have:  A fever or chills.  Chest pain.  Difficulty breathing. Summary  After the procedure, it is common to have pain, soreness, swelling, or bruising near your incision.  If you were prescribed an antibiotic medicine, take it as told by your health care provider. Do not stop taking the antibiotic even if you start to feel better.  Do not drive for 24 hours   if you were given a sedative during your procedure.  Return to your normal activities as told by your health care provider. Ask your health care provider what activities are safe for you. This information is not intended to replace advice given to you by your health care provider. Make sure you discuss any questions you have with your health care provider. Document Revised: 01/12/2018 Document Reviewed: 01/12/2018 Elsevier Patient Education  2020 [place] Catheter Insertion, Care After This  sheet gives you information about how to care for yourself after your procedure. Your health care provider may also give you more specific instructions. If you have problems or questions, contact your health care provider. What can I expect after the procedure? After the procedure, it is common to have:  Some mild redness, bruising, swelling, and pain around your catheter site.  A small amount of blood or clear fluid coming from your incisions. Follow these instructions at home: Incision care   Follow instructions from your health care provider about how to take care of your incisions. Make sure you: ? Wash your hands with soap and water before and after you change your bandages (dressings). If soap and water are not available, use hand sanitizer. ? Change your dressings as told by your health care provider. Wash the area around your incisions with a germ-killing (antiseptic) solution when you change your dressings. ? Leave stitches (sutures), skin glue, or adhesive strips in place. These skin closures may need to stay in place for 2 weeks or longer. If adhesive strip edges start to loosen and curl up, you may trim the loose edges. Do not remove adhesive strips completely unless your health care provider tells you to do that.  Keep your dressings clean and dry.  Check your incision areas every day for signs of infection. Check for: ? More redness, swelling, or pain. ? More fluid or blood. ? Warmth. ? Pus or a bad smell. Catheter care   Wash your hands with soap and water before and after caring for your catheter. If soap and water are not available, use hand sanitizer.  Keep your catheter site clean and dry.  Apply an antibiotic ointment to your catheter site as told by your health care provider.  Flush your catheter as told by your health care provider. This helps prevent it from becoming clogged.  Do not open the caps on the ends of the catheter.  Do not pull on your  catheter. Medicines  Take over-the-counter and prescription medicines only as told by your health care provider.  If you were prescribed an antibiotic medicine, take it as told by your health care provider. Do not stop taking the antibiotic even if you start to feel better. Activity  Return to your normal activities as told by your health care provider. Ask your health care provider what activities are safe for you.  Follow any other activity restrictions as instructed by your health care provider.  Do not lift anything that is heavier than 10 lb (4.5 kg), or the limit that you are told, until your health care provider says that it is safe. Driving  Do not drive until your health care provider approves.  Ask your health care provider if the medicine prescribed to you requires you to avoid driving or using heavy machinery. General instructions  Follow your health care provider's specific instructions for the type of catheter that you have.  Do not take baths, swim, or use a hot tub until  your health care provider approves. Ask your health care provider if you may take showers.  Keep all follow-up visits as told by your health care provider. This is important. Contact a health care provider if:  You feel unusually weak or nauseous.  You have more redness, swelling, or pain at your incisions or around the area where your catheter is inserted.  Your catheter is not working properly.  You are unable to flush your catheter. Get help right away if:  Your catheter develops a hole or it breaks.  You have pain or swelling when fluids or medicines are being given through the catheter.  Fluid is leaking from the catheter, under the dressing, or around the dressing.  Your catheter comes loose or gets pulled completely out. If this happens, press on your catheter site firmly with a clean cloth until you can get medical help.  You have swelling in your shoulder, neck, chest, or  face.  You have chest pain or difficulty breathing.  You feel dizzy or light-headed.  You have pus or a bad smell coming from your catheter site.  You have a fever or chills.  Your catheter site feels warm to the touch.  You develop bleeding from your catheter or your insertion site, and your bleeding does not stop. Summary  After the procedure, it is common to have mild redness, swelling, and pain around your catheter site.  Return to your normal activities as told by your health care provider. Ask your health care provider what activities are safe for you.  Follow your health care provider's specific instructions for the type of catheter that you have.  Keep your catheter site and your dressings clean and dry.  Contact a health care provider if your catheter is not working properly. Get help right away if you have chest pain, fever, or difficulty breathing. This information is not intended to replace advice given to you by your health care provider. Make sure you discuss any questions you have with your health care provider. Document Revised: 11/21/2018 Document Reviewed: 11/21/2018 Elsevier Patient Education  [place].   Moderate Conscious Sedation, Adult, Care After These instructions provide you with information about caring for yourself after your procedure. Your health care provider may also give you more specific instructions. Your treatment has been planned according to current medical practices, but problems sometimes occur. Call your health care provider if you have any problems or questions after your procedure. What can I expect after the procedure? After your procedure, it is common:  To feel sleepy for several hours.  To feel clumsy and have poor balance for several hours.  To have poor judgment for several hours.  To vomit if you eat too soon. Follow these instructions at home: For at least 24 hours after the procedure:   Do not: ? Participate  in activities where you could fall or become injured. ? Drive. ? Use heavy machinery. ? Drink alcohol. ? Take sleeping pills or medicines that cause drowsiness. ? Make important decisions or sign legal documents. ? Take care of children on your own.  Rest. Eating and drinking  Follow the diet recommended by your health care provider.  If you vomit: ? Drink water, juice, or soup when you can drink without vomiting. ? Make sure you have little or no nausea before eating solid foods. General instructions  Have a responsible adult stay with you until you are awake and alert.  Take over-the-counter and prescription medicines only as  told by your health care provider.  If you smoke, do not smoke without supervision.  Keep all follow-up visits as told by your health care provider. This is important. Contact a health care provider if:  You keep feeling nauseous or you keep vomiting.  You feel light-headed.  You develop a rash.  You have a fever. Get help right away if:  You have trouble breathing. This information is not intended to replace advice given to you by your health care provider. Make sure you discuss any questions you have with your health care provider. Document Revised: 11/11/2017 Document Reviewed: 03/20/2016 Elsevier Patient Education  2020 [employer].

## 2020-06-26 ENCOUNTER — Other Ambulatory Visit: Payer: Self-pay | Admitting: Oncology

## 2020-06-27 ENCOUNTER — Inpatient Hospital Stay: Payer: PPO

## 2020-06-27 ENCOUNTER — Inpatient Hospital Stay (HOSPITAL_BASED_OUTPATIENT_CLINIC_OR_DEPARTMENT_OTHER): Payer: PPO | Admitting: Oncology

## 2020-06-27 ENCOUNTER — Other Ambulatory Visit: Payer: Self-pay

## 2020-06-27 ENCOUNTER — Encounter: Payer: Self-pay | Admitting: Oncology

## 2020-06-27 VITALS — BP 125/73 | HR 73 | Temp 96.6°F | Resp 20

## 2020-06-27 VITALS — BP 140/78 | HR 77 | Temp 96.4°F | Resp 16 | Wt 289.8 lb

## 2020-06-27 DIAGNOSIS — Z7189 Other specified counseling: Secondary | ICD-10-CM

## 2020-06-27 DIAGNOSIS — C61 Malignant neoplasm of prostate: Secondary | ICD-10-CM

## 2020-06-27 DIAGNOSIS — Z5111 Encounter for antineoplastic chemotherapy: Secondary | ICD-10-CM | POA: Diagnosis not present

## 2020-06-27 DIAGNOSIS — Z79899 Other long term (current) drug therapy: Secondary | ICD-10-CM

## 2020-06-27 DIAGNOSIS — E860 Dehydration: Secondary | ICD-10-CM

## 2020-06-27 MED ORDER — SIPULEUCEL-T IV INFUSION
250.0000 mL | Freq: Once | INTRAVENOUS | Status: AC
  Administered 2020-06-27: 250 mL via INTRAVENOUS
  Filled 2020-06-27: qty 250

## 2020-06-27 MED ORDER — SODIUM CHLORIDE 0.9 % IV SOLN
Freq: Once | INTRAVENOUS | Status: AC
  Filled 2020-06-27: qty 250

## 2020-06-27 MED ORDER — HEPARIN SODIUM (PORCINE) 1000 UNIT/ML IJ SOLN
1000.0000 [IU] | Freq: Once | INTRAMUSCULAR | Status: AC
  Administered 2020-06-27: 1000 [IU] via INTRAVENOUS
  Filled 2020-06-27: qty 1

## 2020-06-27 MED ORDER — DENOSUMAB 120 MG/1.7ML ~~LOC~~ SOLN
120.0000 mg | Freq: Once | SUBCUTANEOUS | Status: AC
  Administered 2020-06-27: 120 mg via SUBCUTANEOUS
  Filled 2020-06-27: qty 1.7

## 2020-06-27 MED ORDER — ACETAMINOPHEN 325 MG PO TABS
650.0000 mg | ORAL_TABLET | Freq: Once | ORAL | Status: AC
  Administered 2020-06-27: 650 mg via ORAL
  Filled 2020-06-27: qty 2

## 2020-06-27 MED ORDER — DIPHENHYDRAMINE HCL 50 MG/ML IJ SOLN
50.0000 mg | Freq: Once | INTRAMUSCULAR | Status: AC
  Administered 2020-06-27: 50 mg via INTRAVENOUS
  Filled 2020-06-27: qty 1

## 2020-06-27 NOTE — Progress Notes (Signed)
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 . He again began to have a rising PSA in 2010 and underwent orchiectomy in 2010.He had biochemical recurrence in 2017 and was treated with IMRT  He underwent CT abdomen and pelvis in May 2018which showed abnormal periaortic and right common iliac lymph nodes. Index aortocaval lymph node 1.4 cm which was new as compared to February 2014 and suspicious for malignancy. Bone scan did not reveal any evidence of bony metastatic disease.PSA went up from 3.9 in January 20 18-7.8 in April 2018. 13.3 in July 2018 when docetaxel was initiated. Doubling time at that time was about 3 months  Docetaxel chemo initiated for CRPC with ln mets and rapid doubling PSA in august 2018.Patient completed 5 cycles of docetaxel on 09/27/2017. Cycle #6 was not given due to progressive fatigue and cytopenias as well as worsening neuropathy. Cycle 5 of docetaxel was dose reduced to 60 mg/m square.Patient never had normalization of his PSA following docetaxel and the lowest PSA after docetaxel was 5.64 in December 2018  In April 2019 patient noted to have rising PSAto 19.02and increase in the size of retroperitoneal LN. Patient therefore started second line zytiga.  Patient has remained on Zytiga since April 2019. The lowest PSA value on Zytiga was 6.1 in August 2020 and since then there has been a small but steady  increase in his PSA.Most recent imaging in November 2020 showed increase in the size of retroperitoneal lymph nodes from 0.7 cm to 1.1 cm. New periaortic lymph node 1.2 cm. Slight progression of sclerotic bone metastases involving the left scapula as well as T4 and T7. Most recent PSA on 03/07/2020 was 23.9.Given the subtle progression Zytiga was not changed at that time.  repat LN biopsy showed adenocarcinoma prostate. NGS PD-L1 1%.  PTEN C388C (R130X)  Interval history-patient now has a pheresis catheter in place and reports no new complaints at this time other than bruising around the catheter site.  He had to come off Coumadin to get the pheresis catheter placed.  His port is now taken out.  ECOG PS- 1 Pain scale- 0  Review of systems- Review of Systems  Constitutional: Positive for malaise/fatigue. Negative for chills, fever and weight loss.  HENT: Negative for congestion, ear discharge and nosebleeds.   Eyes: Negative for blurred vision.  Respiratory: Negative for cough, hemoptysis, sputum production, shortness of breath and wheezing.   Cardiovascular: Negative for chest pain, palpitations, orthopnea and claudication.  Gastrointestinal: Negative for abdominal pain, blood in stool, constipation, diarrhea, heartburn, melena, nausea and vomiting.  Genitourinary: Negative for dysuria, flank pain, frequency, hematuria and urgency.  Musculoskeletal: Negative for back pain, joint pain and myalgias.  Skin: Negative for rash.  Neurological: Negative for dizziness, tingling, focal weakness, seizures, weakness and headaches.  Endo/Heme/Allergies: Does not bruise/bleed easily.  Psychiatric/Behavioral: Negative for depression and suicidal ideas. The patient does not have insomnia.       Allergies  Allergen Reactions  . Latex Rash    Sensitivity, not  allergy.     Past Medical History:  Diagnosis Date  . Arthritis, degenerative 10/30/2015  . Atrial fibrillation ([place]) 10/30/2015  .  Bladder spasm 06/12/2013  . Carcinoma of prostate ([place]) 10/30/2015  . Cardiomyopathy ([place]) 09/10/2014   Overview:  EF 35%,4/15   . Cerebrovascular accident (CVA) ([place]) 10/30/2015   Overview:  Left embolic MGQ,6761   . Difficult or painful urination 11/21/2013  . Dysrhythmia   . Gout 10/30/2015  . History of kidney stones   . Malignant neoplasm of prostate ([place]) 09/05/2012  . Sinoatrial node dysfunction ([facility]) 10/30/2015   Overview:  DDD pacemaker, 1996      Past Surgical History:  Procedure Laterality Date  . castration  10/2009  . CATARACT EXTRACTION W/ INTRAOCULAR LENS  IMPLANT, BILATERAL Bilateral 2016   right eye done then the left one done 3 months later in 2016  . COLONOSCOPY WITH PROPOFOL N/A 11/21/2019   Procedure: COLONOSCOPY WITH PROPOFOL;  Surgeon: Robert Bellow, MD;  Location: [facility] ENDOSCOPY;  Service: Endoscopy;  Laterality: N/A;  . INGUINAL HERNIA REPAIR Left   . INSERT / REPLACE / REMOVE PACEMAKER    . IR FLUORO GUIDE PORT INSERTION RIGHT  07/01/2017  . IR REMOVAL TUN ACCESS W/ PORT W/O FL MOD SED  06/20/2020  . JOINT REPLACEMENT Bilateral 1980   hips  . left knee replacement  2014  . PACEMAKER INSERTION    . PROSTATECTOMY    . REPLACEMENT TOTAL HIP W/  RESURFACING IMPLANTS  1997   bilateral-   . REPLACEMENT TOTAL KNEE Bilateral   . testes removal     per pt   . TONSILLECTOMY AND ADENOIDECTOMY    . TRANSURETHRAL RESECTION OF BLADDER TUMOR N/A 12/31/2015   Procedure: BLADDER BIOPSY;  Surgeon: Nickie Retort, MD;  Location: [facility] ORS;  Service: Urology;  Laterality: N/A;  . Urinary incontinence valve     AMS    Social History   Socioeconomic History  . Marital status: Married    Spouse name: Not on file  . Number of children: Not on file  . Years of education: Not on file  . Highest education level: Not on file  Occupational History  . Not on file  Tobacco Use  . Smoking status: Never Smoker  . Smokeless tobacco: Never Used  Vaping Use  . Vaping  Use: Never used  Substance and Sexual Activity  . Alcohol use: No    Alcohol/week: 0.0 standard drinks  . Drug use: No  . Sexual activity: Never  Other Topics Concern  . Not on file  Social History Narrative  . Not on file   Social Determinants of Health   Financial Resource Strain:   . Difficulty of Paying Living Expenses:   Food Insecurity:   . Worried About Charity fundraiser in the Last Year:   . Arboriculturist in the Last Year:   Transportation Needs:   . Film/video editor (Medical):   Marland Kitchen Lack of Transportation (Non-Medical):   Physical Activity:   . Days of Exercise per Week:   . Minutes of Exercise per Session:   Stress:   . Feeling of Stress :   Social Connections:   . Frequency of Communication with Friends and Family:   . Frequency of Social Gatherings with Friends and Family:   . Attends Religious Services:   . Active Member of Clubs or Organizations:   . Attends Archivist Meetings:   Marland Kitchen Marital Status:  Intimate Partner Violence:   . Fear of Current or Ex-Partner:   . Emotionally Abused:   Marland Kitchen Physically Abused:   . Sexually Abused:     Family History  Problem Relation Age of Onset  . Cancer Mother   . Arthritis Mother   . Heart attack Father   . Arthritis Father   . Transient ischemic attack Brother   . Prostate cancer Neg Hx   . Bladder Cancer Neg Hx      Current Outpatient Medications:  .  Abiraterone Acetate (ZYTIGA PO), Take 4 capsules by mouth daily. , Disp: , Rfl:  .  abiraterone acetate (ZYTIGA) 250 MG tablet, TAKE 4 TABLETS (1,000 MG TOTAL) BY MOUTH DAILY. TAKE ON AN EMPTY STOMACH 1 HOUR BEFORE OR 2 HOURS AFTER A MEAL, Disp: 120 tablet, Rfl: 3 .  acetaminophen (TYLENOL) 325 MG tablet, Take 650 mg by mouth 2 (two) times daily., Disp: , Rfl:  .  allopurinol (ZYLOPRIM) 300 MG tablet, Take by mouth daily. In am., Disp: , Rfl:  .  bisacodyl (DULCOLAX) 5 MG EC tablet, Take 5 mg by mouth 2 (two) times a day., Disp: , Rfl:  .   calcium carbonate (CALCIUM 600) 600 MG TABS tablet, Take 600 mg by mouth 2 (two) times daily with a meal., Disp: , Rfl:  .  cyanocobalamin (,VITAMIN B-12,) 1000 MCG/ML injection, Inject 1,000 mcg into the muscle every 30 (thirty) days. , Disp: , Rfl:  .  diltiazem (CARDIZEM CD) 180 MG 24 hr capsule, Take 180 mg by mouth daily., Disp: , Rfl:  .  furosemide (LASIX) 20 MG tablet, Take 20 mg by mouth daily., Disp: , Rfl:  .  lidocaine-prilocaine (EMLA) cream, , Disp: , Rfl:  .  phentermine 15 MG capsule, Take 15 mg by mouth every morning., Disp: , Rfl:  .  potassium chloride (KLOR-CON) 10 MEQ tablet, TAKE 2 TABLETS BY MOUTH ONCE DAILY, Disp: 60 tablet, Rfl: 2 .  predniSONE (DELTASONE) 5 MG tablet, TAKE 1 TABLET (5 MG TOTAL) BY MOUTH 2 TIMES DAILY WITH A MEAL., Disp: 60 tablet, Rfl: 5 .  simvastatin (ZOCOR) 40 MG tablet, Take 40 mg by mouth daily at 6 PM. , Disp: , Rfl:  .  warfarin (COUMADIN) 3 MG tablet, Take 3 mg by mouth daily. Takes on Tuesday and thursday, Disp: , Rfl:  .  warfarin (COUMADIN) 6 MG tablet, Take 1 tablet by mouth. Monday, wed, Friday, Saturday and Sunday, Disp: , Rfl:  No current facility-administered medications for this visit.  Facility-Administered Medications Ordered in Other Visits:  .  denosumab (XGEVA) injection 120 mg, 120 mg, Subcutaneous, Q28 days, Sindy Guadeloupe, MD, 120 mg at 09/07/19 1144 .  denosumab (XGEVA) injection 120 mg, 120 mg, Subcutaneous, Q28 days, Sindy Guadeloupe, MD, 120 mg at 11/15/19 1158 .  denosumab (XGEVA) injection 120 mg, 120 mg, Subcutaneous, Once, Sindy Guadeloupe, MD .  sipuleucel-T (PROVENGE) infusion 250 mL, 250 mL, Intravenous, Once, Sindy Guadeloupe, MD  Physical exam:  Vitals:   06/27/20 1114  BP: 140/78  Pulse: 77  Resp: 16  Temp: (!) 96.4 F (35.8 C)  TempSrc: Tympanic  SpO2: 100%  Weight: 289 lb 12.8 oz (131.5 kg)   Physical Exam Constitutional:      General: He is not in acute distress. Cardiovascular:     Rate and Rhythm:  Normal rate. Rhythm irregular.     Heart sounds: Normal heart sounds.     Comments: Bruising noted around the pheresis  catheter site Pulmonary:     Effort: Pulmonary effort is normal.     Breath sounds: Normal breath sounds.  Abdominal:     General: Bowel sounds are normal.     Palpations: Abdomen is soft.  Musculoskeletal:     Comments: Trace bilateral edema.  Scars of bilateral knee replacement  Skin:    General: Skin is warm and dry.  Neurological:     Mental Status: He is alert and oriented to person, place, and time.      CMP Latest Ref Rng & Units 06/19/2020  Glucose 70 - 99 mg/dL 107(H)  BUN 8 - 23 mg/dL 35(H)  Creatinine 0.61 - 1.24 mg/dL 1.00  Sodium 135 - 145 mmol/L 141  Potassium 3.5 - 5.1 mmol/L 4.2  Chloride 98 - 111 mmol/L 104  CO2 22 - 32 mmol/L 26  Calcium 8.9 - 10.3 mg/dL 9.6  Total Protein 6.5 - 8.1 g/dL 7.5  Total Bilirubin 0.3 - 1.2 mg/dL 1.1  Alkaline Phos 38 - 126 U/L 68  AST 15 - 41 U/L 18  ALT 0 - 44 U/L 13   CBC Latest Ref Rng & Units 06/19/2020  WBC 4.0 - 10.5 K/uL 6.3  Hemoglobin 13.0 - 17.0 g/dL 13.1  Hematocrit 39 - 52 % 38.5(L)  Platelets 150 - 400 K/uL 151    No images are attached to the encounter.  IR Removal Tun Access W/ Port W/O [place]  Result Date: 06/20/2020 INDICATION: 74 year old male with a history of prostate cancer. He has an indwelling right-sided port catheter which he no longer needs. New infusions will be performed in conjunction with plasmapheresis. He requires a larger bore tunneled venous access. He presents today for removal of the port catheter and placement of a tunneled hemodialysis catheter. EXAM: REMOVAL RIGHT IJ VEIN PORT-A-CATH MEDICATIONS: None. ANESTHESIA/SEDATION: Moderate (conscious) sedation was employed during this procedure. A total of Versed 1 mg and Fentanyl 50 mcg was administered intravenously. Moderate Sedation Time: 30 minutes. The patient's level of consciousness and vital signs were monitored continuously by  radiology nursing throughout the procedure under my direct supervision. FLUOROSCOPY TIME:  Fluoroscopy Time: 0 minutes 52 seconds (15.8 mGy). COMPLICATIONS: None immediate. PROCEDURE: Informed written consent was obtained from the patient after a thorough discussion of the procedural risks, benefits and alternatives. All questions were addressed. Maximal Sterile Barrier Technique was utilized including caps, mask, sterile gowns, sterile gloves, sterile drape, hand hygiene and skin antiseptic. A timeout was performed prior to the initiation of the procedure. The right chest was prepped and draped in a sterile fashion. Lidocaine was utilized for local anesthesia. An incision was made over the previously healed surgical incision. Utilizing blunt dissection, the port catheter and reservoir were removed from the underlying subcutaneous tissue in their entirety. Securing sutures were also removed. The pocket was irrigated with a copious amount of sterile normal saline. The pocket was closed with interrupted 3-0 Vicryl stitches. The subcutaneous tissue was closed with 3-0 Vicryl interrupted subcutaneous stitches. A 4-0 Vicryl running subcuticular stitch was utilized to approximate the skin. Dermabond was applied. The right internal jugular vein was interrogated with ultrasound and found to be widely patent. An image was obtained and stored for the medical record. Local anesthesia was attained by infiltration with 1% lidocaine. A small dermatotomy was made. Under real-time sonographic guidance, the vessel was punctured with a 21 gauge micropuncture needle. Using standard technique, the initial micro needle was exchanged over a 0.018 micro wire for a transitional 4 [place]  micro sheath. A 0.018 wire was then exchanged for a 0.035 J wire which was advanced into the right atrium. A 14 French peel-away sheath was then advanced over the wire and into the right heart. A suitable skin exit site was selected for the tunneled HD  catheter. Local anesthesia was attained by infiltration with 1% lidocaine. A small dermatotomy was made. A 19 cm tip to cuff palindrome tunneled hemodialysis catheter was then tunneled from the skin exit site to the dermatotomy overlying the venous access site. The catheter was then advanced through the peel-away sheath and the peel-away sheath was discarded. Fluoroscopic imaging demonstrates a well-positioned catheter with the tip at the superior cavoatrial junction. The catheter flushes and aspirates easily. The catheter was flushed with saline and then locked with heparinized saline at 5000 units per mL. The catheter was capped and secured to the skin with 0 Prolene suture. The dermatotomy overlying the venous access site was closed with a 3-0 Vicryl suture in the epidermis sealed with Dermabond. Sterile bandages were placed. IMPRESSION: 1. Successful removal of right chest port catheter. 2. Successful placement of a right IJ approach tunneled hemodialysis catheter. Electronically Signed   By: Jacqulynn Cadet M.D.   On: 06/20/2020 13:29   IR PICC PLACEMENT RIGHT <5 YRS INC IMG GUIDE  Result Date: 06/20/2020 INDICATION: 74 year old male with a history of prostate cancer. He has an indwelling right-sided port catheter which he no longer needs. New infusions will be performed in conjunction with plasmapheresis. He requires a larger bore tunneled venous access. He presents today for removal of the port catheter and placement of a tunneled hemodialysis catheter. EXAM: REMOVAL RIGHT IJ VEIN PORT-A-CATH MEDICATIONS: None. ANESTHESIA/SEDATION: Moderate (conscious) sedation was employed during this procedure. A total of Versed 1 mg and Fentanyl 50 mcg was administered intravenously. Moderate Sedation Time: 30 minutes. The patient's level of consciousness and vital signs were monitored continuously by radiology nursing throughout the procedure under my direct supervision. FLUOROSCOPY TIME:  Fluoroscopy Time: 0 minutes  52 seconds (15.8 mGy). COMPLICATIONS: None immediate. PROCEDURE: Informed written consent was obtained from the patient after a thorough discussion of the procedural risks, benefits and alternatives. All questions were addressed. Maximal Sterile Barrier Technique was utilized including caps, mask, sterile gowns, sterile gloves, sterile drape, hand hygiene and skin antiseptic. A timeout was performed prior to the initiation of the procedure. The right chest was prepped and draped in a sterile fashion. Lidocaine was utilized for local anesthesia. An incision was made over the previously healed surgical incision. Utilizing blunt dissection, the port catheter and reservoir were removed from the underlying subcutaneous tissue in their entirety. Securing sutures were also removed. The pocket was irrigated with a copious amount of sterile normal saline. The pocket was closed with interrupted 3-0 Vicryl stitches. The subcutaneous tissue was closed with 3-0 Vicryl interrupted subcutaneous stitches. A 4-0 Vicryl running subcuticular stitch was utilized to approximate the skin. Dermabond was applied. The right internal jugular vein was interrogated with ultrasound and found to be widely patent. An image was obtained and stored for the medical record. Local anesthesia was attained by infiltration with 1% lidocaine. A small dermatotomy was made. Under real-time sonographic guidance, the vessel was punctured with a 21 gauge micropuncture needle. Using standard technique, the initial micro needle was exchanged over a 0.018 micro wire for a transitional 4 [place] micro sheath. A 0.018 wire was then exchanged for a 0.035 J wire which was advanced into the right atrium. A 14 French peel-away  sheath was then advanced over the wire and into the right heart. A suitable skin exit site was selected for the tunneled HD catheter. Local anesthesia was attained by infiltration with 1% lidocaine. A small dermatotomy was made. A 19 cm tip to  cuff palindrome tunneled hemodialysis catheter was then tunneled from the skin exit site to the dermatotomy overlying the venous access site. The catheter was then advanced through the peel-away sheath and the peel-away sheath was discarded. Fluoroscopic imaging demonstrates a well-positioned catheter with the tip at the superior cavoatrial junction. The catheter flushes and aspirates easily. The catheter was flushed with saline and then locked with heparinized saline at 5000 units per mL. The catheter was capped and secured to the skin with 0 Prolene suture. The dermatotomy overlying the venous access site was closed with a 3-0 Vicryl suture in the epidermis sealed with Dermabond. Sterile bandages were placed. IMPRESSION: 1. Successful removal of right chest port catheter. 2. Successful placement of a right IJ approach tunneled hemodialysis catheter. Electronically Signed   By: Jacqulynn Cadet M.D.   On: 06/20/2020 13:29     Assessment and plan- Patient is a 75 y.o. male with castrate resistant metastatic prostate cancer currently on Zytiga and prednisone here for first Provenge infusion  Patient was seen at [facility] for second opinion by Dr. Roosevelt Locks given his mild radiological progression and rising PSA.  She recommended starting Provenge while continuing Zytiga.  It has taken a while first dose given his first dose of Provenge given that patient initially went on vacation followed by getting cardiology clearance to stop his Coumadin and get his existing port out to put a pheresis catheter and for Provenge  He will be proceeding with his first dose of Provenge today and he will be seen in 2 weeks by covering NP for dose 2.  I will see him back in 4 weeks with CBC with differential CMP and PSA for dose 3.  Again discussed risks and benefits of Provenge including all but not limited to risk of infusion reaction.  Patient understands and agrees to proceed as planned.  Treatment will be given with a palliative  intent.  Patient will continue taking his Zytiga in the meanwhile.  He gets Delton See today again in 4 weeks time   Visit Diagnosis 1. High risk medication use   2. Goals of care, counseling/discussion   3. Malignant neoplasm of prostate [facility])      Dr. Randa Evens, MD, MPH [facility] [phone] 06/27/2020 12:44 PM

## 2020-06-30 DIAGNOSIS — I480 Paroxysmal atrial fibrillation: Secondary | ICD-10-CM | POA: Diagnosis not present

## 2020-06-30 MED FILL — predniSONE 5 MG TABS: 5 | 30 days supply | Qty: 60 | Fill #3

## 2020-07-07 MED FILL — ABIRATERONE ACETATE 250 MG: 250 | 30 days supply | Qty: 120 | Fill #1

## 2020-07-10 ENCOUNTER — Inpatient Hospital Stay: Payer: PPO

## 2020-07-10 ENCOUNTER — Encounter: Payer: Self-pay | Admitting: Nurse Practitioner

## 2020-07-10 ENCOUNTER — Other Ambulatory Visit: Payer: Self-pay

## 2020-07-10 ENCOUNTER — Inpatient Hospital Stay (HOSPITAL_BASED_OUTPATIENT_CLINIC_OR_DEPARTMENT_OTHER): Payer: PPO | Admitting: Nurse Practitioner

## 2020-07-10 VITALS — BP 99/61 | HR 61 | Temp 96.0°F | Resp 18

## 2020-07-10 VITALS — BP 138/72 | HR 97 | Temp 96.4°F | Resp 16 | Wt 285.6 lb

## 2020-07-10 DIAGNOSIS — Z5112 Encounter for antineoplastic immunotherapy: Secondary | ICD-10-CM

## 2020-07-10 DIAGNOSIS — Z5111 Encounter for antineoplastic chemotherapy: Secondary | ICD-10-CM

## 2020-07-10 DIAGNOSIS — C61 Malignant neoplasm of prostate: Secondary | ICD-10-CM

## 2020-07-10 LAB — CBC WITH DIFFERENTIAL/PLATELET
Abs Immature Granulocytes: 0.02 10*3/uL (ref 0.00–0.07)
Basophils Absolute: 0 10*3/uL (ref 0.0–0.1)
Basophils Relative: 0 %
Eosinophils Absolute: 0.1 10*3/uL (ref 0.0–0.5)
Eosinophils Relative: 1 %
HCT: 36.1 % — ABNORMAL LOW (ref 39.0–52.0)
Hemoglobin: 12.3 g/dL — ABNORMAL LOW (ref 13.0–17.0)
Immature Granulocytes: 0 %
Lymphocytes Relative: 19 %
Lymphs Abs: 1 10*3/uL (ref 0.7–4.0)
MCH: 31.6 pg (ref 26.0–34.0)
MCHC: 34.1 g/dL (ref 30.0–36.0)
MCV: 92.8 fL (ref 80.0–100.0)
Monocytes Absolute: 0.5 10*3/uL (ref 0.1–1.0)
Monocytes Relative: 10 %
Neutro Abs: 3.8 10*3/uL (ref 1.7–7.7)
Neutrophils Relative %: 70 %
Platelets: 116 10*3/uL — ABNORMAL LOW (ref 150–400)
RBC: 3.89 MIL/uL — ABNORMAL LOW (ref 4.22–5.81)
RDW: 15.1 % (ref 11.5–15.5)
WBC: 5.4 10*3/uL (ref 4.0–10.5)
nRBC: 0 % (ref 0.0–0.2)

## 2020-07-10 LAB — COMPREHENSIVE METABOLIC PANEL
ALT: 11 U/L (ref 0–44)
AST: 18 U/L (ref 15–41)
Albumin: 4.2 g/dL (ref 3.5–5.0)
Alkaline Phosphatase: 64 U/L (ref 38–126)
Anion gap: 11 (ref 5–15)
BUN: 41 mg/dL — ABNORMAL HIGH (ref 8–23)
CO2: 25 mmol/L (ref 22–32)
Calcium: 9.8 mg/dL (ref 8.9–10.3)
Chloride: 102 mmol/L (ref 98–111)
Creatinine, Ser: 1.17 mg/dL (ref 0.61–1.24)
GFR calc Af Amer: >60 mL/min (ref >60)
GFR calc non Af Amer: >60 mL/min (ref >60)
Glucose, Bld: 111 mg/dL — ABNORMAL HIGH (ref 70–99)
Potassium: 4.4 mmol/L (ref 3.5–5.1)
Sodium: 138 mmol/L (ref 135–145)
Total Bilirubin: 0.9 mg/dL (ref 0.3–1.2)
Total Protein: 6.8 g/dL (ref 6.5–8.1)

## 2020-07-10 MED ORDER — SIPULEUCEL-T IV INFUSION
250.0000 mL | Freq: Once | INTRAVENOUS | Status: AC
  Administered 2020-07-10: 250 mL via INTRAVENOUS
  Filled 2020-07-10: qty 250

## 2020-07-10 MED ORDER — DIPHENHYDRAMINE HCL 50 MG/ML IJ SOLN
50.0000 mg | Freq: Once | INTRAMUSCULAR | Status: AC
  Administered 2020-07-10: 50 mg via INTRAVENOUS
  Filled 2020-07-10: qty 1

## 2020-07-10 MED ORDER — ACETAMINOPHEN 325 MG PO TABS
650.0000 mg | ORAL_TABLET | Freq: Once | ORAL | Status: AC
  Administered 2020-07-10: 650 mg via ORAL
  Filled 2020-07-10: qty 2

## 2020-07-10 MED ORDER — SODIUM CHLORIDE 0.9 % IV SOLN
Freq: Once | INTRAVENOUS | Status: AC
  Filled 2020-07-10: qty 250

## 2020-07-10 MED ORDER — HEPARIN SODIUM (PORCINE) 1000 UNIT/ML IJ SOLN
1600.0000 [IU] | Freq: Once | INTRAMUSCULAR | Status: AC
  Administered 2020-07-10: 1600 [IU] via INTRAVENOUS
  Filled 2020-07-10: qty 1.6

## 2020-07-10 NOTE — Progress Notes (Signed)
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 . He again began to have a rising PSA in 2010 and underwent orchiectomy in 2010.He had biochemical recurrence in 2017 and was treated with IMRT  He underwent CT abdomen and pelvis in May 2018which showed abnormal periaortic and right common iliac lymph nodes. Index aortocaval lymph node 1.4 cm which was new as compared to February 2014 and suspicious for malignancy. Bone scan did not reveal any evidence of bony metastatic disease.PSA went up from 3.9 in January 20 18-7.8 in April 2018. 13.3 in July 2018 when docetaxel was initiated. Doubling time at that time was about 3 months  Docetaxel chemo initiated for CRPC with ln mets and rapid doubling PSA in august 2018.Patient completed 5 cycles of docetaxel on 09/27/2017. Cycle #6 was not given due to progressive fatigue and cytopenias as well as worsening neuropathy. Cycle 5 of docetaxel was dose reduced to 60 mg/m square.Patient never had normalization of his PSA following docetaxel and the lowest PSA after docetaxel was 5.64 in December 2018  In April 2019 patient noted to have rising PSAto 19.02and increase in the size of retroperitoneal LN. Patient therefore started second line zytiga.  Patient has remained on Zytiga since April 2019. The lowest PSA value on Zytiga was 6.1 in August 2020 and since then there has been a small but steady  increase in his PSA.Most recent imaging in November 2020 showed increase in the size of retroperitoneal lymph nodes from 0.7 cm to 1.1 cm. New periaortic lymph node 1.2 cm. Slight progression of sclerotic bone metastases involving the left scapula as well as T4 and T7. Most recent PSA on 03/07/2020 was 23.9.Given the subtle progression Zytiga was not changed at that time.  repat LN biopsy showed adenocarcinoma prostate. NGS PD-L1 1%.  PTEN C388C (R130X)  Interval history-patient returns to clinic today for consideration of cycle 2 of Provenge.  Since he tolerated first dose well without significant side effects.  Had minor chills.  No fatigue, headache, nausea or vomiting.  No pain or fever.   ECOG PS- 1 Pain scale- 0  Review of systems- Review of Systems  Constitutional: Positive for malaise/fatigue. Negative for chills, fever and weight loss.  HENT: Negative for congestion, ear discharge and nosebleeds.   Eyes: Negative for blurred vision.  Respiratory: Negative for cough, hemoptysis, sputum production, shortness of breath and wheezing.   Cardiovascular: Negative for chest pain, palpitations, orthopnea and claudication.  Gastrointestinal: Negative for abdominal pain, blood in stool, constipation, diarrhea, heartburn, melena, nausea and vomiting.  Genitourinary: Negative for dysuria, flank pain, frequency, hematuria and urgency.  Musculoskeletal: Negative for back pain, joint pain and myalgias.  Skin: Negative for rash.  Neurological: Negative for dizziness, tingling, focal weakness, seizures, weakness and headaches.  Endo/Heme/Allergies: Does not bruise/bleed easily.  Psychiatric/Behavioral: Negative for depression and suicidal ideas. The patient does not have insomnia.      Allergies  Allergen Reactions  . Latex Rash    Sensitivity, not allergy.  Past Medical History:  Diagnosis Date  . Arthritis, degenerative 10/30/2015  . Atrial fibrillation ([place]) 10/30/2015  .  Bladder spasm 06/12/2013  . Carcinoma of prostate ([place]) 10/30/2015  . Cardiomyopathy ([place]) 09/10/2014   Overview:  EF 35%,4/15   . Cerebrovascular accident (CVA) ([place]) 10/30/2015   Overview:  Left embolic XLK,4401   . Difficult or painful urination 11/21/2013  . Dysrhythmia   . Gout 10/30/2015  . History of kidney stones   . Malignant neoplasm of prostate ([place]) 09/05/2012  . Sinoatrial node dysfunction ([facility]) 10/30/2015   Overview:  DDD pacemaker, 1996      Past Surgical History:  Procedure Laterality Date  . castration  10/2009  . CATARACT EXTRACTION W/ INTRAOCULAR LENS  IMPLANT, BILATERAL Bilateral 2016   right eye done then the left one done 3 months later in 2016  . COLONOSCOPY WITH PROPOFOL N/A 11/21/2019   Procedure: COLONOSCOPY WITH PROPOFOL;  Surgeon: Robert Bellow, MD;  Location: [facility] ENDOSCOPY;  Service: Endoscopy;  Laterality: N/A;  . INGUINAL HERNIA REPAIR Left   . INSERT / REPLACE / REMOVE PACEMAKER    . IR FLUORO GUIDE PORT INSERTION RIGHT  07/01/2017  . IR REMOVAL TUN ACCESS W/ PORT W/O FL MOD SED  06/20/2020  . JOINT REPLACEMENT Bilateral 1980   hips  . left knee replacement  2014  . PACEMAKER INSERTION    . PROSTATECTOMY    . REPLACEMENT TOTAL HIP W/  RESURFACING IMPLANTS  1997   bilateral-   . REPLACEMENT TOTAL KNEE Bilateral   . testes removal     per pt   . TONSILLECTOMY AND ADENOIDECTOMY    . TRANSURETHRAL RESECTION OF BLADDER TUMOR N/A 12/31/2015   Procedure: BLADDER BIOPSY;  Surgeon: Nickie Retort, MD;  Location: [facility] ORS;  Service: Urology;  Laterality: N/A;  . Urinary incontinence valve     AMS    Social History   Socioeconomic History  . Marital status: Married    Spouse name: Not on file  . Number of children: Not on file  . Years of education: Not on file  . Highest education level: Not on file  Occupational History  . Not on file  Tobacco Use  . Smoking status: Never Smoker  . Smokeless tobacco: Never Used  Vaping Use  . Vaping  Use: Never used  Substance and Sexual Activity  . Alcohol use: No    Alcohol/week: 0.0 standard drinks  . Drug use: No  . Sexual activity: Never  Other Topics Concern  . Not on file  Social History Narrative  . Not on file   Social Determinants of Health   Financial Resource Strain:   . Difficulty of Paying Living Expenses:   Food Insecurity:   . Worried About Charity fundraiser in the Last Year:   . Arboriculturist in the Last Year:   Transportation Needs:   . Film/video editor (Medical):   Marland Kitchen Lack of Transportation (Non-Medical):   Physical Activity:   . Days of Exercise per Week:   . Minutes of Exercise per Session:   Stress:   . Feeling of Stress :   Social Connections:   . Frequency of Communication with Friends and Family:   . Frequency of Social Gatherings with Friends and Family:   . Attends Religious Services:   . Active Member of Clubs or Organizations:   . Attends Archivist Meetings:   Marland Kitchen Marital Status:   Intimate Partner Violence:   .  Fear of Current or Ex-Partner:   . Emotionally Abused:   Marland Kitchen Physically Abused:   . Sexually Abused:     Family History  Problem Relation Age of Onset  . Cancer Mother   . Arthritis Mother   . Heart attack Father   . Arthritis Father   . Transient ischemic attack Brother   . Prostate cancer Neg Hx   . Bladder Cancer Neg Hx      Current Outpatient Medications:  .  Abiraterone Acetate (ZYTIGA PO), Take 4 capsules by mouth daily. , Disp: , Rfl:  .  acetaminophen (TYLENOL) 325 MG tablet, Take 650 mg by mouth 2 (two) times daily., Disp: , Rfl:  .  allopurinol (ZYLOPRIM) 300 MG tablet, Take by mouth daily. In am., Disp: , Rfl:  .  bisacodyl (DULCOLAX) 5 MG EC tablet, Take 5 mg by mouth 2 (two) times a day., Disp: , Rfl:  .  calcium carbonate (CALCIUM 600) 600 MG TABS tablet, Take 600 mg by mouth 2 (two) times daily with a meal., Disp: , Rfl:  .  cyanocobalamin (,VITAMIN B-12,) 1000 MCG/ML injection, Inject  1,000 mcg into the muscle every 30 (thirty) days. , Disp: , Rfl:  .  diltiazem (CARDIZEM CD) 180 MG 24 hr capsule, Take 180 mg by mouth daily., Disp: , Rfl:  .  furosemide (LASIX) 20 MG tablet, Take 20 mg by mouth daily., Disp: , Rfl:  .  lidocaine-prilocaine (EMLA) cream, , Disp: , Rfl:  .  phentermine 15 MG capsule, Take 15 mg by mouth every morning., Disp: , Rfl:  .  potassium chloride (KLOR-CON) 10 MEQ tablet, TAKE 2 TABLETS BY MOUTH ONCE DAILY, Disp: 60 tablet, Rfl: 2 .  simvastatin (ZOCOR) 40 MG tablet, Take 40 mg by mouth daily at 6 PM. , Disp: , Rfl:  .  warfarin (COUMADIN) 3 MG tablet, Take 3 mg by mouth daily. Takes on Tuesday and thursday, Disp: , Rfl:  .  warfarin (COUMADIN) 6 MG tablet, Take 1 tablet by mouth. Monday, wed, Friday, Saturday and Sunday, Disp: , Rfl:  .  predniSONE (DELTASONE) 5 MG tablet, TAKE 1 TABLET (5 MG TOTAL) BY MOUTH 2 TIMES DAILY WITH A MEAL., Disp: 60 tablet, Rfl: 5 No current facility-administered medications for this visit.  Facility-Administered Medications Ordered in Other Visits:  .  denosumab (XGEVA) injection 120 mg, 120 mg, Subcutaneous, Q28 days, Sindy Guadeloupe, MD, 120 mg at 09/07/19 1144 .  denosumab (XGEVA) injection 120 mg, 120 mg, Subcutaneous, Q28 days, Sindy Guadeloupe, MD, 120 mg at 11/15/19 1158  Physical exam:  Vitals:   07/10/20 1031  BP: (!) 138/72  Pulse: 97  Resp: 16  Temp: (!) 96.4 F (35.8 C)  TempSrc: Tympanic  SpO2: 100%  Weight: (!) 285 lb 9.6 oz (129.5 kg)   Physical Exam Constitutional:      General: He is not in acute distress. HENT:     Head: Normocephalic.  Eyes:     General: No scleral icterus.    Conjunctiva/sclera: Conjunctivae normal.  Cardiovascular:     Rate and Rhythm: Normal rate. Rhythm irregular.  Pulmonary:     Effort: Pulmonary effort is normal.     Breath sounds: Normal breath sounds.  Abdominal:     General: Bowel sounds are normal.     Palpations: Abdomen is soft.  Musculoskeletal:      Comments: Trace bilateral edema.  Scars of bilateral knee replacement  Skin:    General: Skin is warm and  dry.  Neurological:     Mental Status: He is alert and oriented to person, place, and time.  Psychiatric:        Mood and Affect: Mood normal.        Behavior: Behavior normal.      CMP Latest Ref Rng & Units 07/10/2020  Glucose 70 - 99 mg/dL 111(H)  BUN 8 - 23 mg/dL 41(H)  Creatinine 0.61 - 1.24 mg/dL 1.17  Sodium 135 - 145 mmol/L 138  Potassium 3.5 - 5.1 mmol/L 4.4  Chloride 98 - 111 mmol/L 102  CO2 22 - 32 mmol/L 25  Calcium 8.9 - 10.3 mg/dL 9.8  Total Protein 6.5 - 8.1 g/dL 6.8  Total Bilirubin 0.3 - 1.2 mg/dL 0.9  Alkaline Phos 38 - 126 U/L 64  AST 15 - 41 U/L 18  ALT 0 - 44 U/L 11   CBC Latest Ref Rng & Units 07/10/2020  WBC 4.0 - 10.5 K/uL 5.4  Hemoglobin 13.0 - 17.0 g/dL 12.3(L)  Hematocrit 39 - 52 % 36.1(L)  Platelets 150 - 400 K/uL 116(L)    No images are attached to the encounter.  IR Removal Tun Access W/ Port W/O [place]  Result Date: 06/20/2020 INDICATION: 74 year old male with a history of prostate cancer. He has an indwelling right-sided port catheter which he no longer needs. New infusions will be performed in conjunction with plasmapheresis. He requires a larger bore tunneled venous access. He presents today for removal of the port catheter and placement of a tunneled hemodialysis catheter. EXAM: REMOVAL RIGHT IJ VEIN PORT-A-CATH MEDICATIONS: None. ANESTHESIA/SEDATION: Moderate (conscious) sedation was employed during this procedure. A total of Versed 1 mg and Fentanyl 50 mcg was administered intravenously. Moderate Sedation Time: 30 minutes. The patient's level of consciousness and vital signs were monitored continuously by radiology nursing throughout the procedure under my direct supervision. FLUOROSCOPY TIME:  Fluoroscopy Time: 0 minutes 52 seconds (15.8 mGy). COMPLICATIONS: None immediate. PROCEDURE: Informed written consent was obtained from the patient after  a thorough discussion of the procedural risks, benefits and alternatives. All questions were addressed. Maximal Sterile Barrier Technique was utilized including caps, mask, sterile gowns, sterile gloves, sterile drape, hand hygiene and skin antiseptic. A timeout was performed prior to the initiation of the procedure. The right chest was prepped and draped in a sterile fashion. Lidocaine was utilized for local anesthesia. An incision was made over the previously healed surgical incision. Utilizing blunt dissection, the port catheter and reservoir were removed from the underlying subcutaneous tissue in their entirety. Securing sutures were also removed. The pocket was irrigated with a copious amount of sterile normal saline. The pocket was closed with interrupted 3-0 Vicryl stitches. The subcutaneous tissue was closed with 3-0 Vicryl interrupted subcutaneous stitches. A 4-0 Vicryl running subcuticular stitch was utilized to approximate the skin. Dermabond was applied. The right internal jugular vein was interrogated with ultrasound and found to be widely patent. An image was obtained and stored for the medical record. Local anesthesia was attained by infiltration with 1% lidocaine. A small dermatotomy was made. Under real-time sonographic guidance, the vessel was punctured with a 21 gauge micropuncture needle. Using standard technique, the initial micro needle was exchanged over a 0.018 micro wire for a transitional 4 [place] micro sheath. A 0.018 wire was then exchanged for a 0.035 J wire which was advanced into the right atrium. A 14 French peel-away sheath was then advanced over the wire and into the right heart. A suitable skin exit site was  selected for the tunneled HD catheter. Local anesthesia was attained by infiltration with 1% lidocaine. A small dermatotomy was made. A 19 cm tip to cuff palindrome tunneled hemodialysis catheter was then tunneled from the skin exit site to the dermatotomy overlying the venous  access site. The catheter was then advanced through the peel-away sheath and the peel-away sheath was discarded. Fluoroscopic imaging demonstrates a well-positioned catheter with the tip at the superior cavoatrial junction. The catheter flushes and aspirates easily. The catheter was flushed with saline and then locked with heparinized saline at 5000 units per mL. The catheter was capped and secured to the skin with 0 Prolene suture. The dermatotomy overlying the venous access site was closed with a 3-0 Vicryl suture in the epidermis sealed with Dermabond. Sterile bandages were placed. IMPRESSION: 1. Successful removal of right chest port catheter. 2. Successful placement of a right IJ approach tunneled hemodialysis catheter. Electronically Signed   By: Jacqulynn Cadet M.D.   On: 06/20/2020 13:29   IR PICC PLACEMENT RIGHT <5 YRS INC IMG GUIDE  Result Date: 06/20/2020 INDICATION: 74 year old male with a history of prostate cancer. He has an indwelling right-sided port catheter which he no longer needs. New infusions will be performed in conjunction with plasmapheresis. He requires a larger bore tunneled venous access. He presents today for removal of the port catheter and placement of a tunneled hemodialysis catheter. EXAM: REMOVAL RIGHT IJ VEIN PORT-A-CATH MEDICATIONS: None. ANESTHESIA/SEDATION: Moderate (conscious) sedation was employed during this procedure. A total of Versed 1 mg and Fentanyl 50 mcg was administered intravenously. Moderate Sedation Time: 30 minutes. The patient's level of consciousness and vital signs were monitored continuously by radiology nursing throughout the procedure under my direct supervision. FLUOROSCOPY TIME:  Fluoroscopy Time: 0 minutes 52 seconds (15.8 mGy). COMPLICATIONS: None immediate. PROCEDURE: Informed written consent was obtained from the patient after a thorough discussion of the procedural risks, benefits and alternatives. All questions were addressed. Maximal Sterile  Barrier Technique was utilized including caps, mask, sterile gowns, sterile gloves, sterile drape, hand hygiene and skin antiseptic. A timeout was performed prior to the initiation of the procedure. The right chest was prepped and draped in a sterile fashion. Lidocaine was utilized for local anesthesia. An incision was made over the previously healed surgical incision. Utilizing blunt dissection, the port catheter and reservoir were removed from the underlying subcutaneous tissue in their entirety. Securing sutures were also removed. The pocket was irrigated with a copious amount of sterile normal saline. The pocket was closed with interrupted 3-0 Vicryl stitches. The subcutaneous tissue was closed with 3-0 Vicryl interrupted subcutaneous stitches. A 4-0 Vicryl running subcuticular stitch was utilized to approximate the skin. Dermabond was applied. The right internal jugular vein was interrogated with ultrasound and found to be widely patent. An image was obtained and stored for the medical record. Local anesthesia was attained by infiltration with 1% lidocaine. A small dermatotomy was made. Under real-time sonographic guidance, the vessel was punctured with a 21 gauge micropuncture needle. Using standard technique, the initial micro needle was exchanged over a 0.018 micro wire for a transitional 4 [place] micro sheath. A 0.018 wire was then exchanged for a 0.035 J wire which was advanced into the right atrium. A 14 French peel-away sheath was then advanced over the wire and into the right heart. A suitable skin exit site was selected for the tunneled HD catheter. Local anesthesia was attained by infiltration with 1% lidocaine. A small dermatotomy was made. A 19 cm tip  to cuff palindrome tunneled hemodialysis catheter was then tunneled from the skin exit site to the dermatotomy overlying the venous access site. The catheter was then advanced through the peel-away sheath and the peel-away sheath was discarded.  Fluoroscopic imaging demonstrates a well-positioned catheter with the tip at the superior cavoatrial junction. The catheter flushes and aspirates easily. The catheter was flushed with saline and then locked with heparinized saline at 5000 units per mL. The catheter was capped and secured to the skin with 0 Prolene suture. The dermatotomy overlying the venous access site was closed with a 3-0 Vicryl suture in the epidermis sealed with Dermabond. Sterile bandages were placed. IMPRESSION: 1. Successful removal of right chest port catheter. 2. Successful placement of a right IJ approach tunneled hemodialysis catheter. Electronically Signed   By: Jacqulynn Cadet M.D.   On: 06/20/2020 13:29     Assessment and plan- Patient is a 74 y.o. male with castrate resistant metastatic prostate cancer currently on Provenge s/p dose 1, Zytiga and prednisone with Xgeva.   He was previously seen at [facility] for second opinion by Dr. Roosevelt Locks and had mild radiological progression with rising PSA.  He had apheresis catheter placed he initiated palliative Provenge on 06/27/2020.  Tolerated dose 1 well without significant side effects.  Labs today reviewed and acceptable for treatment.  He discussed that acute infusion reactions are slightly higher incidence with second infusion and can occur within 1 day of infusion.  He will be premedicated with Tylenol and Benadryl.  Discussed with nursing plan for management of infusion reaction.  Discussed symptoms with patient and he agrees to proceed. Proceed with dose two of Provenge.  Continue Zytiga.  Follow-up with Dr. Janese Banks in 4 weeks with CBC with differential, CMP, and PSA for dose 3 of provenge and xgeva.     Visit Diagnosis 1. Malignant neoplasm of prostate ([place])      Beckey Rutter, [place], AGNP-C [place] at [facility] [phone] (clinic)

## 2020-07-10 NOTE — Progress Notes (Signed)
Pt tolerated infusion well. No s/s of distress or reaction noted. Pt and VS stable at discharge.  

## 2020-07-14 ENCOUNTER — Telehealth: Payer: Self-pay | Admitting: *Deleted

## 2020-07-14 NOTE — Telephone Encounter (Signed)
Pt called to ask if he can change his appt to a later time tom. I did speak to Maudie Mercury and she is agreeable and it was changed to 3:30 and pt notified about it

## 2020-07-15 ENCOUNTER — Inpatient Hospital Stay: Payer: PPO | Attending: Oncology

## 2020-07-15 ENCOUNTER — Inpatient Hospital Stay: Payer: PPO

## 2020-07-15 ENCOUNTER — Other Ambulatory Visit: Payer: Self-pay

## 2020-07-15 DIAGNOSIS — Z5111 Encounter for antineoplastic chemotherapy: Secondary | ICD-10-CM | POA: Insufficient documentation

## 2020-07-15 DIAGNOSIS — Z9079 Acquired absence of other genital organ(s): Secondary | ICD-10-CM | POA: Diagnosis not present

## 2020-07-15 DIAGNOSIS — C772 Secondary and unspecified malignant neoplasm of intra-abdominal lymph nodes: Secondary | ICD-10-CM | POA: Diagnosis not present

## 2020-07-15 DIAGNOSIS — Z7952 Long term (current) use of systemic steroids: Secondary | ICD-10-CM | POA: Insufficient documentation

## 2020-07-15 DIAGNOSIS — C7951 Secondary malignant neoplasm of bone: Secondary | ICD-10-CM | POA: Insufficient documentation

## 2020-07-15 DIAGNOSIS — Z809 Family history of malignant neoplasm, unspecified: Secondary | ICD-10-CM | POA: Insufficient documentation

## 2020-07-15 DIAGNOSIS — C61 Malignant neoplasm of prostate: Secondary | ICD-10-CM

## 2020-07-15 MED ORDER — HEPARIN SODIUM (PORCINE) 1000 UNIT/ML IJ SOLN
3200.0000 [IU] | Freq: Once | INTRAMUSCULAR | Status: AC
  Administered 2020-07-15: 3200 [IU] via INTRAVENOUS
  Filled 2020-07-15: qty 3.2

## 2020-07-15 MED ORDER — SODIUM CHLORIDE 0.9% FLUSH
10.0000 mL | Freq: Once | INTRAVENOUS | Status: AC
  Administered 2020-07-15: 10 mL via INTRAVENOUS
  Filled 2020-07-15: qty 10

## 2020-07-24 ENCOUNTER — Telehealth: Payer: Self-pay | Admitting: *Deleted

## 2020-07-24 ENCOUNTER — Inpatient Hospital Stay (HOSPITAL_BASED_OUTPATIENT_CLINIC_OR_DEPARTMENT_OTHER): Payer: PPO | Admitting: Oncology

## 2020-07-24 ENCOUNTER — Encounter: Payer: Self-pay | Admitting: Oncology

## 2020-07-24 ENCOUNTER — Other Ambulatory Visit: Payer: Self-pay

## 2020-07-24 ENCOUNTER — Inpatient Hospital Stay: Payer: PPO

## 2020-07-24 VITALS — BP 126/84 | HR 97 | Temp 96.3°F | Resp 16 | Wt 284.0 lb

## 2020-07-24 VITALS — BP 111/66 | HR 80 | Temp 97.9°F | Resp 18

## 2020-07-24 DIAGNOSIS — C61 Malignant neoplasm of prostate: Secondary | ICD-10-CM

## 2020-07-24 DIAGNOSIS — Z5111 Encounter for antineoplastic chemotherapy: Secondary | ICD-10-CM | POA: Diagnosis not present

## 2020-07-24 DIAGNOSIS — C772 Secondary and unspecified malignant neoplasm of intra-abdominal lymph nodes: Secondary | ICD-10-CM | POA: Diagnosis not present

## 2020-07-24 DIAGNOSIS — Z7983 Long term (current) use of bisphosphonates: Secondary | ICD-10-CM | POA: Diagnosis not present

## 2020-07-24 DIAGNOSIS — Z79899 Other long term (current) drug therapy: Secondary | ICD-10-CM

## 2020-07-24 DIAGNOSIS — E860 Dehydration: Secondary | ICD-10-CM

## 2020-07-24 LAB — CBC WITH DIFFERENTIAL/PLATELET
Abs Immature Granulocytes: 0.01 10*3/uL (ref 0.00–0.07)
Basophils Absolute: 0 10*3/uL (ref 0.0–0.1)
Basophils Relative: 0 %
Eosinophils Absolute: 0.1 10*3/uL (ref 0.0–0.5)
Eosinophils Relative: 2 %
HCT: 34.8 % — ABNORMAL LOW (ref 39.0–52.0)
Hemoglobin: 11.9 g/dL — ABNORMAL LOW (ref 13.0–17.0)
Immature Granulocytes: 0 %
Lymphocytes Relative: 17 %
Lymphs Abs: 0.9 10*3/uL (ref 0.7–4.0)
MCH: 31.4 pg (ref 26.0–34.0)
MCHC: 34.2 g/dL (ref 30.0–36.0)
MCV: 91.8 fL (ref 80.0–100.0)
Monocytes Absolute: 0.4 10*3/uL (ref 0.1–1.0)
Monocytes Relative: 8 %
Neutro Abs: 3.6 10*3/uL (ref 1.7–7.7)
Neutrophils Relative %: 73 %
Platelets: 125 10*3/uL — ABNORMAL LOW (ref 150–400)
RBC: 3.79 MIL/uL — ABNORMAL LOW (ref 4.22–5.81)
RDW: 14.9 % (ref 11.5–15.5)
WBC: 5.1 10*3/uL (ref 4.0–10.5)
nRBC: 0 % (ref 0.0–0.2)

## 2020-07-24 LAB — COMPREHENSIVE METABOLIC PANEL
ALT: 10 U/L (ref 0–44)
AST: 16 U/L (ref 15–41)
Albumin: 3.9 g/dL (ref 3.5–5.0)
Alkaline Phosphatase: 55 U/L (ref 38–126)
Anion gap: 7 (ref 5–15)
BUN: 27 mg/dL — ABNORMAL HIGH (ref 8–23)
CO2: 26 mmol/L (ref 22–32)
Calcium: 9.2 mg/dL (ref 8.9–10.3)
Chloride: 106 mmol/L (ref 98–111)
Creatinine, Ser: 0.94 mg/dL (ref 0.61–1.24)
GFR calc Af Amer: >60 mL/min (ref >60)
GFR calc non Af Amer: >60 mL/min (ref >60)
Glucose, Bld: 96 mg/dL (ref 70–99)
Potassium: 3.8 mmol/L (ref 3.5–5.1)
Sodium: 139 mmol/L (ref 135–145)
Total Bilirubin: 1 mg/dL (ref 0.3–1.2)
Total Protein: 6.5 g/dL (ref 6.5–8.1)

## 2020-07-24 LAB — PSA: Prostatic Specific Antigen: 81.28 ng/mL — ABNORMAL HIGH (ref 0.00–4.00)

## 2020-07-24 MED ORDER — HEPARIN SODIUM (PORCINE) 1000 UNIT/ML IJ SOLN
1600.0000 [IU] | Freq: Once | INTRAMUSCULAR | Status: AC
  Administered 2020-07-24: 1600 [IU] via INTRAVENOUS
  Filled 2020-07-24: qty 1.6

## 2020-07-24 MED ORDER — SODIUM CHLORIDE 0.9 % IV SOLN
Freq: Once | INTRAVENOUS | Status: AC
  Filled 2020-07-24: qty 250

## 2020-07-24 MED ORDER — ACETAMINOPHEN 325 MG PO TABS
650.0000 mg | ORAL_TABLET | Freq: Once | ORAL | Status: AC
  Administered 2020-07-24: 650 mg via ORAL
  Filled 2020-07-24: qty 2

## 2020-07-24 MED ORDER — SIPULEUCEL-T IV INFUSION
250.0000 mL | Freq: Once | INTRAVENOUS | Status: AC
  Administered 2020-07-24: 250 mL via INTRAVENOUS
  Filled 2020-07-24: qty 250

## 2020-07-24 MED ORDER — DIPHENHYDRAMINE HCL 50 MG/ML IJ SOLN
50.0000 mg | Freq: Once | INTRAMUSCULAR | Status: AC
  Administered 2020-07-24: 50 mg via INTRAVENOUS
  Filled 2020-07-24: qty 1

## 2020-07-24 MED ORDER — DENOSUMAB 120 MG/1.7ML ~~LOC~~ SOLN
120.0000 mg | Freq: Once | SUBCUTANEOUS | Status: AC
  Administered 2020-07-24: 120 mg via SUBCUTANEOUS
  Filled 2020-07-24: qty 1.7

## 2020-07-24 MED ORDER — SODIUM CHLORIDE 0.9% FLUSH
10.0000 mL | INTRAVENOUS | Status: DC | PRN
  Administered 2020-07-24: 10 mL
  Filled 2020-07-24: qty 10

## 2020-07-24 MED ORDER — SODIUM CHLORIDE 0.9% FLUSH
10.0000 mL | Freq: Once | INTRAVENOUS | Status: AC
  Administered 2020-07-24: 10 mL via INTRAVENOUS
  Filled 2020-07-24: qty 10

## 2020-07-24 NOTE — Telephone Encounter (Signed)
Called and spoke to secretary about pt coming off coumadin to get his apheresis catheter removed. Today was his last provenge infusion and pt no longer needs catheter. They will have them call me back

## 2020-07-24 NOTE — Progress Notes (Signed)
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 . He again began to have a rising PSA in 2010 and underwent orchiectomy in 2010.He had biochemical recurrence in 2017 and was treated with IMRT  He underwent CT abdomen and pelvis in May 2018which showed abnormal periaortic and right common iliac lymph nodes. Index aortocaval lymph node 1.4 cm which was new as compared to February 2014 and suspicious for malignancy. Bone scan did not reveal any evidence of bony metastatic disease.PSA went up from 3.9 in January 20 18-7.8 in April 2018. 13.3 in July 2018 when docetaxel was initiated. Doubling time at that time was about 3 months  Docetaxel chemo initiated for CRPC with ln mets and rapid doubling PSA in august 2018.Patient completed 5 cycles of docetaxel on 09/27/2017. Cycle #6 was not given due to progressive fatigue and cytopenias as well as worsening neuropathy. Cycle 5 of docetaxel was dose reduced to 60 mg/m square.Patient never had normalization of his PSA following docetaxel and the lowest PSA after docetaxel was 5.64 in December 2018  In April 2019 patient noted to have rising PSAto 19.02and increase in the size of retroperitoneal LN. Patient therefore started second line zytiga.  Patient has remained on Zytiga since April 2019. The lowest PSA value on Zytiga was 6.1 in August 2020 and since then there has been a small but steady increase in his  PSA.Most recent imaging in November 2020 showed increase in the size of retroperitoneal lymph nodes from 0.7 cm to 1.1 cm. New periaortic lymph node 1.2 cm. Slight progression of sclerotic bone metastases involving the left scapula as well as T4 and T7. Most recent PSA on 03/07/2020 was 23.9.Given the subtle progression Zytiga was not changed at that time.  repat LN biopsy showed adenocarcinoma prostate. NGS PD-L1 1%.  PTEN C388C (R130X)  Patient went to [facility] for second opinion and was recommended Provenge given the rise in PSA and mild radiological progression  Interval history-he feels well overall and denies any complaints at this time.  He has mild chronic fatigue.  Denies any new pain.  Weight and appetite has remained stable  ECOG PS- 1 Pain scale- 0   Review of systems- Review of Systems  Constitutional: Positive for malaise/fatigue. Negative for chills, fever and weight loss.  HENT: Negative for congestion, ear discharge and nosebleeds.   Eyes: Negative for blurred vision.  Respiratory: Negative for cough, hemoptysis, sputum production, shortness of breath and wheezing.   Cardiovascular: Negative for chest pain, palpitations, orthopnea and claudication.  Gastrointestinal: Negative for abdominal pain, blood in stool, constipation, diarrhea, heartburn, melena, nausea and vomiting.  Genitourinary: Negative for dysuria, flank pain, frequency, hematuria and urgency.  Musculoskeletal: Negative for back pain, joint pain and myalgias.  Skin: Negative for rash.  Neurological: Negative for dizziness, tingling, focal weakness, seizures, weakness and headaches.  Endo/Heme/Allergies: Does not bruise/bleed easily.  Psychiatric/Behavioral: Negative for depression and suicidal ideas. The patient does not have insomnia.       Allergies  Allergen Reactions  .  Latex Rash    Sensitivity, not allergy.     Past Medical History:  Diagnosis Date  . Arthritis, degenerative 10/30/2015  .  Atrial fibrillation ([place]) 10/30/2015  . Bladder spasm 06/12/2013  . Carcinoma of prostate ([place]) 10/30/2015  . Cardiomyopathy ([place]) 09/10/2014   Overview:  EF 35%,4/15   . Cerebrovascular accident (CVA) ([place]) 10/30/2015   Overview:  Left embolic ACZ,6606   . Difficult or painful urination 11/21/2013  . Dysrhythmia   . Gout 10/30/2015  . History of kidney stones   . Malignant neoplasm of prostate ([place]) 09/05/2012  . Sinoatrial node dysfunction ([facility]) 10/30/2015   Overview:  DDD pacemaker, 1996      Past Surgical History:  Procedure Laterality Date  . castration  10/2009  . CATARACT EXTRACTION W/ INTRAOCULAR LENS  IMPLANT, BILATERAL Bilateral 2016   right eye done then the left one done 3 months later in 2016  . COLONOSCOPY WITH PROPOFOL N/A 11/21/2019   Procedure: COLONOSCOPY WITH PROPOFOL;  Surgeon: Robert Bellow, MD;  Location: [facility] ENDOSCOPY;  Service: Endoscopy;  Laterality: N/A;  . INGUINAL HERNIA REPAIR Left   . INSERT / REPLACE / REMOVE PACEMAKER    . IR FLUORO GUIDE PORT INSERTION RIGHT  07/01/2017  . IR REMOVAL TUN ACCESS W/ PORT W/O FL MOD SED  06/20/2020  . JOINT REPLACEMENT Bilateral 1980   hips  . left knee replacement  2014  . PACEMAKER INSERTION    . PROSTATECTOMY    . REPLACEMENT TOTAL HIP W/  RESURFACING IMPLANTS  1997   bilateral-   . REPLACEMENT TOTAL KNEE Bilateral   . testes removal     per pt   . TONSILLECTOMY AND ADENOIDECTOMY    . TRANSURETHRAL RESECTION OF BLADDER TUMOR N/A 12/31/2015   Procedure: BLADDER BIOPSY;  Surgeon: Nickie Retort, MD;  Location: [facility] ORS;  Service: Urology;  Laterality: N/A;  . Urinary incontinence valve     AMS    Social History   Socioeconomic History  . Marital status: Married    Spouse name: Not on file  . Number of children: Not on file  . Years of education: Not on file  . Highest education level: Not on file  Occupational History  . Not on file  Tobacco Use  . Smoking status: Never Smoker  . Smokeless  tobacco: Never Used  Vaping Use  . Vaping Use: Never used  Substance and Sexual Activity  . Alcohol use: No    Alcohol/week: 0.0 standard drinks  . Drug use: No  . Sexual activity: Never  Other Topics Concern  . Not on file  Social History Narrative  . Not on file   Social Determinants of Health   Financial Resource Strain:   . Difficulty of Paying Living Expenses:   Food Insecurity:   . Worried About Charity fundraiser in the Last Year:   . Arboriculturist in the Last Year:   Transportation Needs:   . Film/video editor (Medical):   Marland Kitchen Lack of Transportation (Non-Medical):   Physical Activity:   . Days of Exercise per Week:   . Minutes of Exercise per Session:   Stress:   . Feeling of Stress :   Social Connections:   . Frequency of Communication with Friends and Family:   . Frequency of Social Gatherings with Friends and Family:   . Attends Religious Services:   . Active Member of Clubs or Organizations:   . Attends Archivist  Meetings:   Marland Kitchen Marital Status:   Intimate Partner Violence:   . Fear of Current or Ex-Partner:   . Emotionally Abused:   Marland Kitchen Physically Abused:   . Sexually Abused:     Family History  Problem Relation Age of Onset  . Cancer Mother   . Arthritis Mother   . Heart attack Father   . Arthritis Father   . Transient ischemic attack Brother   . Prostate cancer Neg Hx   . Bladder Cancer Neg Hx      Current Outpatient Medications:  .  Abiraterone Acetate (ZYTIGA PO), Take 4 capsules by mouth daily. , Disp: , Rfl:  .  acetaminophen (TYLENOL) 325 MG tablet, Take 650 mg by mouth 2 (two) times daily., Disp: , Rfl:  .  allopurinol (ZYLOPRIM) 300 MG tablet, Take by mouth daily. In am., Disp: , Rfl:  .  bisacodyl (DULCOLAX) 5 MG EC tablet, Take 5 mg by mouth 2 (two) times a day., Disp: , Rfl:  .  calcium carbonate (CALCIUM 600) 600 MG TABS tablet, Take 600 mg by mouth 2 (two) times daily with a meal., Disp: , Rfl:  .  cyanocobalamin  (,VITAMIN B-12,) 1000 MCG/ML injection, Inject 1,000 mcg into the muscle every 30 (thirty) days. , Disp: , Rfl:  .  diltiazem (CARDIZEM CD) 180 MG 24 hr capsule, Take 180 mg by mouth daily., Disp: , Rfl:  .  furosemide (LASIX) 20 MG tablet, Take 20 mg by mouth daily., Disp: , Rfl:  .  lidocaine-prilocaine (EMLA) cream, , Disp: , Rfl:  .  phentermine 15 MG capsule, Take 15 mg by mouth every morning., Disp: , Rfl:  .  potassium chloride (KLOR-CON) 10 MEQ tablet, TAKE 2 TABLETS BY MOUTH ONCE DAILY, Disp: 60 tablet, Rfl: 2 .  predniSONE (DELTASONE) 5 MG tablet, TAKE 1 TABLET (5 MG TOTAL) BY MOUTH 2 TIMES DAILY WITH A MEAL., Disp: 60 tablet, Rfl: 5 .  simvastatin (ZOCOR) 40 MG tablet, Take 40 mg by mouth daily at 6 PM. , Disp: , Rfl:  .  warfarin (COUMADIN) 3 MG tablet, Take 3 mg by mouth daily. Takes on Tuesday and thursday, Disp: , Rfl:  .  warfarin (COUMADIN) 6 MG tablet, Take 1 tablet by mouth. Monday, wed, Friday, Saturday and Sunday, Disp: , Rfl:  No current facility-administered medications for this visit.  Facility-Administered Medications Ordered in Other Visits:  .  denosumab (XGEVA) injection 120 mg, 120 mg, Subcutaneous, Q28 days, Sindy Guadeloupe, MD, 120 mg at 09/07/19 1144 .  denosumab (XGEVA) injection 120 mg, 120 mg, Subcutaneous, Q28 days, Sindy Guadeloupe, MD, 120 mg at 11/15/19 1158 .  heparin sodium (porcine) injection 1,600 Units, 1,600 Units, Intravenous, Once, Randa Evens C, MD .  sodium chloride flush (NS) 0.9 % injection 10 mL, 10 mL, Intracatheter, PRN, Sindy Guadeloupe, MD, 10 mL at 07/24/20 1324  Physical exam:  Vitals:   07/24/20 1120  BP: 126/84  Pulse: 97  Resp: 16  Temp: (!) 96.3 F (35.7 C)  TempSrc: Tympanic  SpO2: 99%  Weight: 284 lb (128.8 kg)   Physical Exam Constitutional:      General: He is not in acute distress. Pulmonary:     Effort: Pulmonary effort is normal.  Skin:    General: Skin is warm and dry.  Neurological:     Mental Status: He is alert  and oriented to person, place, and time.   Pheresis catheter in place over the right chest wall  CMP Latest Ref Rng & Units 07/24/2020  Glucose 70 - 99 mg/dL 96  BUN 8 - 23 mg/dL 27(H)  Creatinine 0.61 - 1.24 mg/dL 0.94  Sodium 135 - 145 mmol/L 139  Potassium 3.5 - 5.1 mmol/L 3.8  Chloride 98 - 111 mmol/L 106  CO2 22 - 32 mmol/L 26  Calcium 8.9 - 10.3 mg/dL 9.2  Total Protein 6.5 - 8.1 g/dL 6.5  Total Bilirubin 0.3 - 1.2 mg/dL 1.0  Alkaline Phos 38 - 126 U/L 55  AST 15 - 41 U/L 16  ALT 0 - 44 U/L 10   CBC Latest Ref Rng & Units 07/24/2020  WBC 4.0 - 10.5 K/uL 5.1  Hemoglobin 13.0 - 17.0 g/dL 11.9(L)  Hematocrit 39 - 52 % 34.8(L)  Platelets 150 - 400 K/uL 125(L)     Assessment and plan- Patient is a 74 y.o. male with castrate resistant metastatic prostate cancer currently on Zytiga and prednisone.  He is here for dose 3 of Provenge  Today is the dose 3 of Provenge which would be his last dose.  Following this the pheresis catheter will be removed.  PSA from today is pending.  I will plan to get repeat CT chest abdomen pelvis with contrast as well as a bone scan in 6 weeks.  Check CBC CMP and PSA about time.  Xgeva today and again in 6 weeks and I will see him at that time to discuss his scans and further management.  Patient is to continue taking his Zytiga in the interim     Visit Diagnosis 1. Prostate cancer metastatic to intraabdominal lymph node ([place])      Dr. Randa Evens, MD, MPH [facility] at [facility] [phone] 07/24/2020 4:18 PM

## 2020-07-25 ENCOUNTER — Other Ambulatory Visit: Payer: Self-pay | Admitting: Oncology

## 2020-07-25 ENCOUNTER — Telehealth: Payer: Self-pay | Admitting: *Deleted

## 2020-07-25 ENCOUNTER — Other Ambulatory Visit: Payer: Self-pay | Admitting: *Deleted

## 2020-07-25 DIAGNOSIS — C772 Secondary and unspecified malignant neoplasm of intra-abdominal lymph nodes: Secondary | ICD-10-CM

## 2020-07-25 DIAGNOSIS — C61 Malignant neoplasm of prostate: Secondary | ICD-10-CM

## 2020-07-25 DIAGNOSIS — Z452 Encounter for adjustment and management of vascular access device: Secondary | ICD-10-CM

## 2020-07-25 NOTE — Telephone Encounter (Signed)
I contacted pt so that we can get catheter out . Got permission to stop coumadin for 5 days starting Monday next week and procedure on 8/20 arrival at 9:30 for 10:30 procedure. Pt aware. He states that he was very achy joint today and I told him it was probably the infusion and xgeva together. He has been taking tylenol and today it got a little better. I told him to call if it gets worse

## 2020-07-31 MED FILL — ABIRATERONE ACETATE 250 MG: 250 | 30 days supply | Qty: 120 | Fill #2

## 2020-07-31 MED FILL — predniSONE 5 MG TABS: 5 | 30 days supply | Qty: 60 | Fill #4

## 2020-08-01 ENCOUNTER — Ambulatory Visit
Admission: RE | Admit: 2020-08-01 | Discharge: 2020-08-01 | Disposition: A | Discharge status: Home / Self Care | Payer: PPO | Source: Ambulatory Visit | Attending: Oncology | Admitting: Oncology

## 2020-08-01 ENCOUNTER — Other Ambulatory Visit: Payer: Self-pay

## 2020-08-01 DIAGNOSIS — Z452 Encounter for adjustment and management of vascular access device: Secondary | ICD-10-CM | POA: Diagnosis not present

## 2020-08-01 DIAGNOSIS — C61 Malignant neoplasm of prostate: Secondary | ICD-10-CM | POA: Diagnosis not present

## 2020-08-01 DIAGNOSIS — Z4901 Encounter for fitting and adjustment of extracorporeal dialysis catheter: Secondary | ICD-10-CM | POA: Diagnosis not present

## 2020-08-01 DIAGNOSIS — C772 Secondary and unspecified malignant neoplasm of intra-abdominal lymph nodes: Secondary | ICD-10-CM | POA: Diagnosis not present

## 2020-08-01 HISTORY — PX: IR REMOVAL TUN CV CATH W/O FL: IMG2289

## 2020-08-01 NOTE — Procedures (Signed)
Interventional Radiology Procedure Note  Procedure: Tunneled central catheter removal  Complications: None  Estimated Blood Loss: None  Findings: Right sided tunneled HD/pheresis catheter removed after blunt dissection to free cuff from scar tissue in subcutaneous tunnel. Catheter removed in entirety.  Venetia Night. Kathlene Cote, M.D Pager:  [phone]

## 2020-08-01 NOTE — Discharge Instructions (Signed)
Tunneled Catheter Removal, Care After Refer to this sheet in the next few weeks. These instructions provide you with information about caring for yourself after your procedure. Your health care provider may also give you more specific instructions. Your treatment has been planned according to current medical practices, but problems sometimes occur. Call your health care provider if you have any problems or questions after your procedure. What can I expect after the procedure? After the procedure, it is common to have: Some mild redness, swelling, and pain around your catheter site.   Follow these instructions at home: Incision care  Check your removal site  every day for signs of infection. Check for: More redness, swelling, or pain. More fluid or blood. Warmth. Pus or a bad smell. Remove your dressing in 48hrs leave open to air  Activity  Return to your normal activities as told by your health care provider. Ask your health care provider what activities are safe for you. Do not lift anything that is heavier than 10 lb (4.5 kg) for 3 days  You may shower tomorrow  Contact a health care provider if: You have more fluid or blood coming from your removal site You have more redness, swelling, or pain at your incisions or around the area where your catheter was removed Your removal site feel warm to the touch. You feel unusually weak. You feel nauseous.. Get help right away if You have swelling in your arm, shoulder, neck, or face. You develop chest pain. You have difficulty breathing. You feel dizzy or light-headed. You have pus or a bad smell coming from your removal site You have a fever. You develop bleeding from your removal site, and your bleeding does not stop. This information is not intended to replace advice given to you by your health care provider. Make sure you discuss any questions you have with your health care provider. Document Released: 11/15/2012 Document Revised:  08/01/2016 Document Reviewed: 08/25/2015 Elsevier Interactive Patient Education  2017 Elsevier Inc. 

## 2020-08-01 NOTE — Progress Notes (Signed)
Patient post tunneled catheter removal per Dr Kathlene Cote, tolerated well. Dressing dry/intact. Discharge instructions given to patient. Questions answered.

## 2020-08-06 DIAGNOSIS — E782 Mixed hyperlipidemia: Secondary | ICD-10-CM | POA: Diagnosis not present

## 2020-08-06 DIAGNOSIS — I495 Sick sinus syndrome: Secondary | ICD-10-CM | POA: Diagnosis not present

## 2020-08-06 DIAGNOSIS — I6300 Cerebral infarction due to thrombosis of unspecified precerebral artery: Secondary | ICD-10-CM | POA: Diagnosis not present

## 2020-08-06 DIAGNOSIS — I4811 Longstanding persistent atrial fibrillation: Secondary | ICD-10-CM | POA: Diagnosis not present

## 2020-08-06 DIAGNOSIS — C61 Malignant neoplasm of prostate: Secondary | ICD-10-CM | POA: Diagnosis not present

## 2020-08-06 DIAGNOSIS — I480 Paroxysmal atrial fibrillation: Secondary | ICD-10-CM | POA: Diagnosis not present

## 2020-08-15 DIAGNOSIS — D696 Thrombocytopenia, unspecified: Secondary | ICD-10-CM | POA: Diagnosis not present

## 2020-08-21 DIAGNOSIS — B349 Viral infection, unspecified: Secondary | ICD-10-CM | POA: Diagnosis not present

## 2020-08-25 ENCOUNTER — Other Ambulatory Visit: Payer: Self-pay | Admitting: *Deleted

## 2020-08-25 ENCOUNTER — Ambulatory Visit: Payer: PPO | Attending: Internal Medicine

## 2020-08-25 DIAGNOSIS — Z23 Encounter for immunization: Secondary | ICD-10-CM

## 2020-08-25 NOTE — Progress Notes (Signed)
   Covid-19 Vaccination Clinic  Name:  Jeremy Carney    MRN: [id number] DOB: [birth year]  08/25/2020  [name] was observed post Covid-19 immunization for 15 minutes without incident. He was provided with Vaccine Information Sheet and instruction to access the V-Safe system.   [name] was instructed to call 911 with any severe reactions post vaccine: Marland Kitchen Difficulty breathing  . Swelling of face and throat  . A fast heartbeat  . A bad rash all over body  . Dizziness and weakness   Immunizations Administered    Name Date Dose VIS Date Route   Moderna COVID-19 Vaccine 08/25/2020 10:03 AM 0.5 mL 11/2019 Intramuscular   Manufacturer: Moderna   Lot: [id number]   [place]: [id number]

## 2020-08-29 ENCOUNTER — Telehealth: Payer: Self-pay | Admitting: *Deleted

## 2020-08-29 NOTE — Telephone Encounter (Signed)
Pt called today to say the appt for next week for port labs -he does not have port anymore. He would like to get labs the same day he comes. He will just get a istat creat with his scans. appt changed and pt knows to come 10:45 for labs and see md on 9/27

## 2020-09-02 ENCOUNTER — Other Ambulatory Visit: Payer: Self-pay | Admitting: Oncology

## 2020-09-02 DIAGNOSIS — I495 Sick sinus syndrome: Secondary | ICD-10-CM | POA: Diagnosis not present

## 2020-09-03 ENCOUNTER — Inpatient Hospital Stay: Payer: PPO

## 2020-09-03 MED FILL — predniSONE 5 MG TABS: 5 | 30 days supply | Qty: 60 | Fill #5

## 2020-09-03 MED FILL — ABIRATERONE ACETATE 250 MG: 250 | 30 days supply | Qty: 120 | Fill #3

## 2020-09-04 ENCOUNTER — Encounter
Admission: RE | Admit: 2020-09-04 | Discharge: 2020-09-04 | Disposition: A | Discharge status: Home / Self Care | Payer: PPO | Source: Ambulatory Visit | Attending: Oncology | Admitting: Oncology

## 2020-09-04 ENCOUNTER — Ambulatory Visit
Admission: RE | Admit: 2020-09-04 | Discharge: 2020-09-04 | Disposition: A | Discharge status: Home / Self Care | Payer: PPO | Source: Ambulatory Visit | Attending: Oncology | Admitting: Oncology

## 2020-09-04 ENCOUNTER — Other Ambulatory Visit: Payer: Self-pay

## 2020-09-04 DIAGNOSIS — K5730 Diverticulosis of large intestine without perforation or abscess without bleeding: Secondary | ICD-10-CM | POA: Diagnosis not present

## 2020-09-04 DIAGNOSIS — Z96642 Presence of left artificial hip joint: Secondary | ICD-10-CM | POA: Diagnosis not present

## 2020-09-04 DIAGNOSIS — I2510 Atherosclerotic heart disease of native coronary artery without angina pectoris: Secondary | ICD-10-CM | POA: Diagnosis not present

## 2020-09-04 DIAGNOSIS — I358 Other nonrheumatic aortic valve disorders: Secondary | ICD-10-CM | POA: Diagnosis not present

## 2020-09-04 DIAGNOSIS — C61 Malignant neoplasm of prostate: Secondary | ICD-10-CM | POA: Insufficient documentation

## 2020-09-04 DIAGNOSIS — C7951 Secondary malignant neoplasm of bone: Secondary | ICD-10-CM | POA: Diagnosis not present

## 2020-09-04 DIAGNOSIS — C772 Secondary and unspecified malignant neoplasm of intra-abdominal lymph nodes: Secondary | ICD-10-CM | POA: Insufficient documentation

## 2020-09-04 DIAGNOSIS — Z8546 Personal history of malignant neoplasm of prostate: Secondary | ICD-10-CM | POA: Diagnosis not present

## 2020-09-04 DIAGNOSIS — I700 Atherosclerosis of aorta: Secondary | ICD-10-CM | POA: Diagnosis not present

## 2020-09-04 LAB — POCT I-STAT CREATININE: Creatinine, Ser: 1.1 mg/dL (ref 0.61–1.24)

## 2020-09-04 MED ORDER — IOHEXOL 350 MG/ML SOLN
100.0000 mL | Freq: Once | INTRAVENOUS | Status: AC | PRN
  Administered 2020-09-04: 100 mL via INTRAVENOUS

## 2020-09-04 MED ORDER — TECHNETIUM TC 99M MEDRONATE IV KIT
20.0000 | PACK | Freq: Once | INTRAVENOUS | Status: AC | PRN
  Administered 2020-09-04: 23.044 via INTRAVENOUS

## 2020-09-08 ENCOUNTER — Other Ambulatory Visit: Payer: Self-pay | Admitting: *Deleted

## 2020-09-08 ENCOUNTER — Inpatient Hospital Stay: Payer: PPO

## 2020-09-08 ENCOUNTER — Other Ambulatory Visit: Payer: Self-pay

## 2020-09-08 ENCOUNTER — Inpatient Hospital Stay: Payer: PPO | Attending: Oncology | Admitting: Oncology

## 2020-09-08 VITALS — BP 132/78 | HR 81 | Temp 97.3°F | Resp 16 | Wt 284.3 lb

## 2020-09-08 DIAGNOSIS — C7951 Secondary malignant neoplasm of bone: Secondary | ICD-10-CM | POA: Insufficient documentation

## 2020-09-08 DIAGNOSIS — Z7952 Long term (current) use of systemic steroids: Secondary | ICD-10-CM | POA: Diagnosis not present

## 2020-09-08 DIAGNOSIS — Z9079 Acquired absence of other genital organ(s): Secondary | ICD-10-CM | POA: Diagnosis not present

## 2020-09-08 DIAGNOSIS — Z7189 Other specified counseling: Secondary | ICD-10-CM | POA: Diagnosis not present

## 2020-09-08 DIAGNOSIS — C61 Malignant neoplasm of prostate: Secondary | ICD-10-CM | POA: Insufficient documentation

## 2020-09-08 DIAGNOSIS — C772 Secondary and unspecified malignant neoplasm of intra-abdominal lymph nodes: Secondary | ICD-10-CM | POA: Diagnosis not present

## 2020-09-08 DIAGNOSIS — Z809 Family history of malignant neoplasm, unspecified: Secondary | ICD-10-CM | POA: Insufficient documentation

## 2020-09-08 DIAGNOSIS — Z7983 Long term (current) use of bisphosphonates: Secondary | ICD-10-CM

## 2020-09-08 DIAGNOSIS — R5383 Other fatigue: Secondary | ICD-10-CM | POA: Insufficient documentation

## 2020-09-08 DIAGNOSIS — E860 Dehydration: Secondary | ICD-10-CM

## 2020-09-08 LAB — CBC WITH DIFFERENTIAL/PLATELET
Abs Immature Granulocytes: 0.02 10*3/uL (ref 0.00–0.07)
Basophils Absolute: 0 10*3/uL (ref 0.0–0.1)
Basophils Relative: 0 %
Eosinophils Absolute: 0.1 10*3/uL (ref 0.0–0.5)
Eosinophils Relative: 2 %
HCT: 36.2 % — ABNORMAL LOW (ref 39.0–52.0)
Hemoglobin: 12.4 g/dL — ABNORMAL LOW (ref 13.0–17.0)
Immature Granulocytes: 0 %
Lymphocytes Relative: 18 %
Lymphs Abs: 0.9 10*3/uL (ref 0.7–4.0)
MCH: 31.2 pg (ref 26.0–34.0)
MCHC: 34.3 g/dL (ref 30.0–36.0)
MCV: 91.2 fL (ref 80.0–100.0)
Monocytes Absolute: 0.4 10*3/uL (ref 0.1–1.0)
Monocytes Relative: 8 %
Neutro Abs: 3.8 10*3/uL (ref 1.7–7.7)
Neutrophils Relative %: 72 %
Platelets: 147 10*3/uL — ABNORMAL LOW (ref 150–400)
RBC: 3.97 MIL/uL — ABNORMAL LOW (ref 4.22–5.81)
RDW: 14.6 % (ref 11.5–15.5)
WBC: 5.3 10*3/uL (ref 4.0–10.5)
nRBC: 0 % (ref 0.0–0.2)

## 2020-09-08 LAB — COMPREHENSIVE METABOLIC PANEL
ALT: 11 U/L (ref 0–44)
AST: 19 U/L (ref 15–41)
Albumin: 4 g/dL (ref 3.5–5.0)
Alkaline Phosphatase: 69 U/L (ref 38–126)
Anion gap: 7 (ref 5–15)
BUN: 22 mg/dL (ref 8–23)
CO2: 26 mmol/L (ref 22–32)
Calcium: 9 mg/dL (ref 8.9–10.3)
Chloride: 102 mmol/L (ref 98–111)
Creatinine, Ser: 1.12 mg/dL (ref 0.61–1.24)
GFR calc Af Amer: >60 mL/min (ref >60)
GFR calc non Af Amer: >60 mL/min (ref >60)
Glucose, Bld: 91 mg/dL (ref 70–99)
Potassium: 3.9 mmol/L (ref 3.5–5.1)
Sodium: 135 mmol/L (ref 135–145)
Total Bilirubin: 1.2 mg/dL (ref 0.3–1.2)
Total Protein: 7.1 g/dL (ref 6.5–8.1)

## 2020-09-08 LAB — PSA: Prostatic Specific Antigen: 142.08 ng/mL — ABNORMAL HIGH (ref 0.00–4.00)

## 2020-09-08 MED ORDER — DENOSUMAB 120 MG/1.7ML ~~LOC~~ SOLN
120.0000 mg | Freq: Once | SUBCUTANEOUS | Status: AC
  Administered 2020-09-08: 120 mg via SUBCUTANEOUS
  Filled 2020-09-08: qty 1.7

## 2020-09-09 ENCOUNTER — Telehealth (INDEPENDENT_AMBULATORY_CARE_PROVIDER_SITE_OTHER): Payer: Self-pay

## 2020-09-09 ENCOUNTER — Other Ambulatory Visit
Admission: RE | Admit: 2020-09-09 | Discharge: 2020-09-09 | Disposition: A | Discharge status: Home / Self Care | Payer: PPO | Source: Ambulatory Visit | Attending: Vascular Surgery | Admitting: Vascular Surgery

## 2020-09-09 DIAGNOSIS — Z01812 Encounter for preprocedural laboratory examination: Secondary | ICD-10-CM | POA: Diagnosis not present

## 2020-09-09 DIAGNOSIS — Z20822 Contact with and (suspected) exposure to covid-19: Secondary | ICD-10-CM | POA: Insufficient documentation

## 2020-09-09 LAB — SARS CORONAVIRUS 2 (TAT 6-24 HRS): SARS Coronavirus 2: NEGATIVE

## 2020-09-09 NOTE — Telephone Encounter (Signed)
Spoke with the patient and he is scheduled with Dr. Lucky Cowboy for a port placement on 09/11/20 with a 11:00 am arrival time to the [facility]. Covid testing on 09/09/20 between 8-1 pm at the [facility]. Pre-procedure instructions were discussed and will be mailed.

## 2020-09-10 ENCOUNTER — Encounter: Payer: Self-pay | Admitting: Oncology

## 2020-09-10 ENCOUNTER — Telehealth: Payer: Self-pay | Admitting: *Deleted

## 2020-09-10 ENCOUNTER — Other Ambulatory Visit (INDEPENDENT_AMBULATORY_CARE_PROVIDER_SITE_OTHER): Payer: Self-pay | Admitting: Nurse Practitioner

## 2020-09-10 MED ORDER — ONDANSETRON HCL 8 MG PO TABS: 8.0000 mg | ORAL_TABLET | Freq: Two times a day (BID) | ORAL | 1 refills | Status: DC | PRN

## 2020-09-10 MED ORDER — LIDOCAINE-PRILOCAINE 2.5-2.5 % EX CREA: TOPICAL_CREAM | CUTANEOUS | 3 refills | Status: AC

## 2020-09-10 MED ORDER — PREDNISONE 10 MG PO TABS: 10.0000 mg | ORAL_TABLET | Freq: Every day | ORAL | 3 refills | Status: DC

## 2020-09-10 MED ORDER — PROCHLORPERAZINE MALEATE 10 MG PO TABS: 10.0000 mg | ORAL_TABLET | Freq: Four times a day (QID) | ORAL | 1 refills | Status: DC | PRN

## 2020-09-10 NOTE — Progress Notes (Signed)
START ON PATHWAY REGIMEN - Prostate     A cycle is every 21 days.:     Cabazitaxel      Prednisone   **Always confirm dose/schedule in your pharmacy ordering system**  Patient Characteristics: Adenocarcinoma, Recurrent/New Systemic Disease, Castration Resistant, M1, Prior Novel Hormonal Agent, No Molecular Alteration or Targeted Therapy Exhausted, Prior Docetaxel/Docetaxel Not Indicated Histology: Adenocarcinoma Therapeutic Status: Recurrent/New Systemic Disease  Intent of Therapy: Non-Curative / Palliative Intent, Discussed with Patient 

## 2020-09-10 NOTE — Telephone Encounter (Signed)
Pt called and he has these questions: He is suppose to get 2nd covid shot on 10/11 - he wants to know if he should get it, his first tx is 10/5 and then he said you told him after 3-4 cycles he will get a scan and he wanted to make [place] aware that he has family coming for thanksgiving and does not want to do anything that week. he also wants his scans before the end of the year for he has met deductable. Dr. Janese Banks said that he should get the vaccination shot, she will adjust the scans to make sure they are in the year 2021. I called pt back to let him know.

## 2020-09-10 NOTE — H&P (View-Only) (Signed)
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 . He again began to have a rising PSA in 2010 and underwent orchiectomy in 2010.He had biochemical recurrence in 2017 and was treated with IMRT  He underwent CT abdomen and pelvis in May 2018which showed abnormal periaortic and right common iliac lymph nodes. Index aortocaval lymph node 1.4 cm which was new as compared to February 2014 and suspicious for malignancy. Bone scan did not reveal any evidence of bony metastatic disease.PSA went up from 3.9 in January 20 18-7.8 in April 2018. 13.3 in July 2018 when docetaxel was initiated. Doubling time at that time was about 3 months  Docetaxel chemo initiated for CRPC with ln mets and rapid doubling PSA in august 2018.Patient completed 5 cycles of docetaxel on 09/27/2017. Cycle #6 was not given due to progressive fatigue and cytopenias as well as worsening neuropathy. Cycle 5 of docetaxel was dose reduced to 60 mg/m square.Patient never had normalization of his PSA following docetaxel and the lowest PSA after docetaxel was 5.64 in December 2018  In April 2019 patient noted to have rising PSAto 19.02and increase in the size of retroperitoneal LN. Patient therefore started second line zytiga.  Patient has remained on Zytiga since April 2019. The lowest PSA value on Zytiga was 6.1 in August 2020 and since then there has been a small but steady increase in his  PSA.Most recent imaging in November 2020 showed increase in the size of retroperitoneal lymph nodes from 0.7 cm to 1.1 cm. New periaortic lymph node 1.2 cm. Slight progression of sclerotic bone metastases involving the left scapula as well as T4 and T7. Most recent PSA on 03/07/2020 was 23.9.Given the subtle progression Zytiga was not changed at that time.  repat LN biopsy showed adenocarcinoma prostate. NGSPD-L1 1%. PTEN C388C (R130X)  Patient went to [facility] for second opinion and was recommended Provenge given the rise in PSA and mild radiological progression   Interval history- Patient is doing well and denies any pain at this time. Appetite is stable  ECOG PS- 1 Pain scale- 0   Review of systems- Review of Systems  Constitutional: Positive for malaise/fatigue. Negative for chills, fever and weight loss.  HENT: Negative for congestion, ear discharge and nosebleeds.   Eyes: Negative for blurred vision.  Respiratory: Negative for cough, hemoptysis, sputum production, shortness of breath and wheezing.   Cardiovascular: Negative for chest pain, palpitations, orthopnea and claudication.  Gastrointestinal: Negative for abdominal pain, blood in stool, constipation, diarrhea, heartburn, melena, nausea and vomiting.  Genitourinary: Negative for dysuria, flank pain, frequency, hematuria and urgency.  Musculoskeletal: Negative for back pain, joint pain and myalgias.  Skin: Negative for rash.  Neurological: Negative for dizziness, tingling, focal weakness, seizures, weakness and headaches.  Endo/Heme/Allergies: Does not bruise/bleed easily.  Psychiatric/Behavioral: Negative for depression and suicidal ideas. The patient does not have insomnia.       Allergies  Allergen Reactions  . Latex Rash    Sensitivity, not allergy.     Past Medical History:  Diagnosis Date  . Arthritis, degenerative 10/30/2015  . Atrial fibrillation ([place]) 10/30/2015  . Bladder spasm 06/12/2013  . Carcinoma  of prostate ([place]) 10/30/2015  . Cardiomyopathy ([place]) 09/10/2014   Overview:  EF 35%,4/15   . Cerebrovascular accident (CVA) ([place]) 10/30/2015   Overview:  Left embolic [id number]   . Difficult or painful urination 11/21/2013  . Dysrhythmia   . Gout 10/30/2015  . History of kidney stones   . Malignant neoplasm of prostate ([place]) 09/05/2012  . Sinoatrial node dysfunction ([facility]) 10/30/2015   Overview:  DDD pacemaker, 1996      Past Surgical History:  Procedure Laterality Date  . castration  10/2009  . CATARACT EXTRACTION W/ INTRAOCULAR LENS  IMPLANT, BILATERAL Bilateral 2016   right eye done then the left one done 3 months later in 2016  . COLONOSCOPY WITH PROPOFOL N/A 11/21/2019   Procedure: COLONOSCOPY WITH PROPOFOL;  Surgeon: Robert Bellow, MD;  Location: [facility] ENDOSCOPY;  Service: Endoscopy;  Laterality: N/A;  . INGUINAL HERNIA REPAIR Left   . INSERT / REPLACE / REMOVE PACEMAKER    . IR FLUORO GUIDE PORT INSERTION RIGHT  07/01/2017  . IR REMOVAL TUN ACCESS W/ PORT W/O FL MOD SED  06/20/2020  . IR REMOVAL TUN CV CATH W/O FL  08/01/2020  . JOINT REPLACEMENT Bilateral 1980   hips  . left knee replacement  2014  . PACEMAKER INSERTION    . PROSTATECTOMY    . REPLACEMENT TOTAL HIP W/  RESURFACING IMPLANTS  1997   bilateral-   . REPLACEMENT TOTAL KNEE Bilateral   . testes removal     per pt   . TONSILLECTOMY AND ADENOIDECTOMY    . TRANSURETHRAL RESECTION OF BLADDER TUMOR N/A 12/31/2015   Procedure: BLADDER BIOPSY;  Surgeon: Nickie Retort, MD;  Location: [facility] ORS;  Service: Urology;  Laterality: N/A;  . Urinary incontinence valve     AMS    Social History   Socioeconomic History  . Marital status: Married    Spouse name: Not on file  . Number of children: Not on file  . Years of education: Not on file  . Highest education level: Not on file  Occupational History  . Not on file  Tobacco Use  . Smoking status: Never Smoker  . Smokeless tobacco: Never Used  Vaping Use  .  Vaping Use: Never used  Substance and Sexual Activity  . Alcohol use: No    Alcohol/week: 0.0 standard drinks  . Drug use: No  . Sexual activity: Never  Other Topics Concern  . Not on file  Social History Narrative  . Not on file   Social Determinants of Health   Financial Resource Strain:   . Difficulty of Paying Living Expenses: Not on file  Food Insecurity:   . Worried About Charity fundraiser in the Last Year: Not on file  . Ran Out of Food in the Last Year: Not on file  Transportation Needs:   . Lack of Transportation (Medical): Not on file  . Lack of Transportation (Non-Medical): Not on file  Physical Activity:   . Days of Exercise per Week: Not on file  . Minutes of Exercise per Session: Not on file  Stress:   . Feeling of Stress : Not on file  Social Connections:   . Frequency of Communication with Friends and Family: Not on file  . Frequency of Social Gatherings with Friends and Family: Not on file  . Attends Religious Services: Not  on file  . Active Member of Clubs or Organizations: Not on file  . Attends Club or Organization Meetings: Not on file  . Marital Status: Not on file  Intimate Partner Violence:   . Fear of Current or Ex-Partner: Not on file  . Emotionally Abused: Not on file  . Physically Abused: Not on file  . Sexually Abused: Not on file    Family History  Problem Relation Age of Onset  . Cancer Mother   . Arthritis Mother   . Heart attack Father   . Arthritis Father   . Transient ischemic attack Brother   . Prostate cancer Neg Hx   . Bladder Cancer Neg Hx      Current Outpatient Medications:  .  Abiraterone Acetate (ZYTIGA PO), Take 4 capsules by mouth daily. , Disp: , Rfl:  .  acetaminophen (TYLENOL) 325 MG tablet, Take 650 mg by mouth 2 (two) times daily., Disp: , Rfl:  .  allopurinol (ZYLOPRIM) 300 MG tablet, Take by mouth daily. In am., Disp: , Rfl:  .  bisacodyl (DULCOLAX) 5 MG EC tablet, Take 5 mg by mouth 2 (two) times a day.,  Disp: , Rfl:  .  calcium carbonate (CALCIUM 600) 600 MG TABS tablet, Take 600 mg by mouth 2 (two) times daily with a meal., Disp: , Rfl:  .  cyanocobalamin (,VITAMIN B-12,) 1000 MCG/ML injection, Inject 1,000 mcg into the muscle every 30 (thirty) days. , Disp: , Rfl:  .  diltiazem (CARDIZEM CD) 180 MG 24 hr capsule, Take 180 mg by mouth daily., Disp: , Rfl:  .  furosemide (LASIX) 20 MG tablet, Take 20 mg by mouth daily., Disp: , Rfl:  .  lidocaine-prilocaine (EMLA) cream, , Disp: , Rfl:  .  phentermine 15 MG capsule, Take 15 mg by mouth every morning., Disp: , Rfl:  .  potassium chloride (KLOR-CON) 10 MEQ tablet, TAKE 2 TABLETS BY MOUTH ONCE DAILY, Disp: 60 tablet, Rfl: 2 .  predniSONE (DELTASONE) 5 MG tablet, TAKE 1 TABLET (5 MG TOTAL) BY MOUTH 2 TIMES DAILY WITH A MEAL., Disp: 60 tablet, Rfl: 5 .  simvastatin (ZOCOR) 40 MG tablet, Take 40 mg by mouth daily at 6 PM. , Disp: , Rfl:  .  warfarin (COUMADIN) 3 MG tablet, Take 3 mg by mouth daily. Takes on Tuesday and thursday, Disp: , Rfl:  .  warfarin (COUMADIN) 6 MG tablet, Take 1 tablet by mouth. Monday, wed, Friday, Saturday and Sunday, Disp: , Rfl:  No current facility-administered medications for this visit.  Facility-Administered Medications Ordered in Other Visits:  .  denosumab (XGEVA) injection 120 mg, 120 mg, Subcutaneous, Q28 days, Aaro Meyers C, MD, 120 mg at 11/15/19 1158  Physical exam: There were no vitals filed for this visit. Physical Exam Constitutional:      General: He is not in acute distress. Cardiovascular:     Rate and Rhythm: Normal rate and regular rhythm.     Heart sounds: Normal heart sounds.  Pulmonary:     Effort: Pulmonary effort is normal.     Breath sounds: Normal breath sounds.  Abdominal:     General: Bowel sounds are normal.     Palpations: Abdomen is soft.  Skin:    General: Skin is warm and dry.     Coloration: Jaundice:    Neurological:     Mental Status: He is alert and oriented to person,  place, and time.      CMP Latest Ref Rng & Units   09/08/2020  Glucose 70 - 99 mg/dL 91  BUN 8 - 23 mg/dL 22  Creatinine 0.61 - 1.24 mg/dL 1.12  Sodium 135 - 145 mmol/L 135  Potassium 3.5 - 5.1 mmol/L 3.9  Chloride 98 - 111 mmol/L 102  CO2 22 - 32 mmol/L 26  Calcium 8.9 - 10.3 mg/dL 9.0  Total Protein 6.5 - 8.1 g/dL 7.1  Total Bilirubin 0.3 - 1.2 mg/dL 1.2  Alkaline Phos 38 - 126 U/L 69  AST 15 - 41 U/L 19  ALT 0 - 44 U/L 11   CBC Latest Ref Rng & Units 09/08/2020  WBC 4.0 - 10.5 K/uL 5.3  Hemoglobin 13.0 - 17.0 g/dL 12.4(L)  Hematocrit 39 - 52 % 36.2(L)  Platelets 150 - 400 K/uL 147(L)    No images are attached to the encounter.  NM Bone Scan Whole Body  Result Date: 09/05/2020 CLINICAL DATA:  Prostate cancer, restaging examination EXAM: NUCLEAR MEDICINE WHOLE BODY BONE SCAN TECHNIQUE: Whole body anterior and posterior images were obtained approximately 3 hours after intravenous injection of radiopharmaceutical. RADIOPHARMACEUTICALS:  23.044 mCi Technetium-71mMDP IV COMPARISON:  10/23/2019. Findings are also correlated with CT examinations of 09/04/2020 and 03/19/2020 FINDINGS: There is increasing uptake of radiotracer involving the left frontal bone and inferior aspect of the left scapula. Additionally, there is new uptake of radiotracer involving the right fifth rib anteriorly, medial right acetabulum, and T3, T5, and T7 vertebral bodies. Uptake within the a posterior left right third rib, however, appears to have resolved in the interval. Uptake involving the maxilla anteriorly appears unchanged, possibly related to periodontal disease. Extensive uptake within the shoulders bilaterally and, to a lesser extent, within the sternoclavicular joints bilaterally is likely degenerative in nature. Uptake within the feet bilaterally is likely degenerative in nature. Uptake within the left superior acetabulum appears to correspond to an area of increasing osteolysis anterosuperior to the  acetabular component of the left total hip arthroplasty likely representing an aggressive granulocytic reaction. Normal soft tissue distribution. Normal uptake and excretion within the kidneys and bladder. IMPRESSION: Mixed response to therapy with development of multiple new metastases, progression of disease involving existing metastases within the left frontal bone and left scapula, but resolution of a single metastasis within the posterior right third rib. Focal uptake of radiotracer involving an area of osteolysis surrounding the left hip prosthesis acetabular component. A a superimposed pathologic fracture is not excluded and dedicated CT examination of the left hip may be helpful for further evaluation. Electronically Signed   By: AFidela SalisburyMD   On: 09/05/2020 01:46   CT CHEST ABDOMEN PELVIS W CONTRAST  Result Date: 09/04/2020 CLINICAL DATA:  74year old male with history of castrate resistant metastatic prostate cancer, currently on Zytiga and prednisone. EXAM: CT CHEST, ABDOMEN, AND PELVIS WITH CONTRAST TECHNIQUE: Multidetector CT imaging of the chest, abdomen and pelvis was performed following the standard protocol during bolus administration of intravenous contrast. CONTRAST:  1027mOMNIPAQUE IOHEXOL 350 MG/ML SOLN COMPARISON:  CT the chest, abdomen and pelvis 03/19/2020. FINDINGS: CT CHEST FINDINGS Cardiovascular: Heart size is mildly enlarged with left atrial dilatation. There is no significant pericardial fluid, thickening or pericardial calcification. There is aortic atherosclerosis, as well as atherosclerosis of the great vessels of the mediastinum and the coronary arteries, including calcified atherosclerotic plaque in the left main, left anterior descending, left circumflex and right coronary arteries. Mild calcifications of the aortic valve and mitral annulus. Left-sided pacemaker/AICD with lead tips terminating in the right atrial appendage and right  ventricular apex. Mediastinum/Nodes:  Multiple prominent borderline enlarged and mildly enlarged mediastinal lymph nodes, largest of which is in the subcarinal nodal station where there is a node or cluster of nodes measuring up to 3.1 cm in short axis. Esophagus is unremarkable in appearance. No axillary lymphadenopathy. Lungs/Pleura: No suspicious appearing pulmonary nodules or masses are noted. No acute consolidative airspace disease. No pleural effusions. Areas of chronic scarring and architectural distortion in the lower lobes of the lungs bilaterally, similar to the prior study. Chronic elevation of the left hemidiaphragm, similar to the prior study. Musculoskeletal: Multiple predominantly sclerotic lesions are again noted throughout the visualized axial and appendicular skeleton, some of which have increased in size, with a good example in the anterior aspect of T4 (sagittal image 110 of series 6) measuring 1.7 cm in diameter (previously 8 mm). Chronic compression fracture of T3 and T12, most severe at T12 where there is with approximately 75% loss of anterior vertebral body height, similar to prior examination. CT ABDOMEN PELVIS FINDINGS Hepatobiliary: No suspicious cystic or solid hepatic lesions. No intra or extrahepatic biliary ductal dilatation. Gallbladder is normal in appearance. Pancreas: No pancreatic mass. No pancreatic ductal dilatation. No pancreatic or peripancreatic fluid collections or inflammatory changes. Spleen: Unremarkable. Adrenals/Urinary Tract: No suspicious renal lesions. No hydroureteronephrosis. Urinary bladder is normal in appearance. Bilateral adrenal glands are normal in appearance. Stomach/Bowel: Normal appearance of the stomach. No pathologic dilatation of small bowel or colon. Numerous colonic diverticulae are noted, particularly in the sigmoid colon, without surrounding inflammatory changes to suggest an acute diverticulitis at this time. The appendix is not confidently identified and may be surgically absent.  Regardless, there are no inflammatory changes noted adjacent to the cecum to suggest the presence of an acute appendicitis at this time. Vascular/Lymphatic: Aortic atherosclerosis, without evidence of aneurysm or dissection in the abdominal or pelvic vasculature. Markedly worsened lymphadenopathy the throughout the retroperitoneum and pelvis. The largest of these retroperitoneal nodes is a bulky nodal conglomerate in the left para-aortic nodal station near the left renal hilum (axial image 74 of series 2) measuring 7.4 x 6.3 cm (previously 2.3 x 2.8 cm). The largest pelvic lymph node is in the right common iliac nodal station (axial image 100 of series 2) currently measuring 2.1 cm in short axis (previously 1.2 cm). Reproductive: Status post radical prostatectomy. No unexpected soft tissue mass in the low anatomic pelvis. Other: No significant volume of ascites.  No pneumoperitoneum. Musculoskeletal: Status post bilateral hip arthroplasty. There are no aggressive appearing lytic or blastic lesions noted in the visualized portions of the skeleton. IMPRESSION: 1. Today's study demonstrates progression of metastatic disease, most notable for increasing lymphadenopathy in the mediastinum, retroperitoneum and pelvis, as well as enlarging osseous lesions, as detailed above. 2. Colonic diverticulosis without evidence of acute diverticulitis at this time. 3. Aortic atherosclerosis, in addition to left main and 3 vessel coronary artery disease. Assessment for potential risk factor modification, dietary therapy or pharmacologic therapy may be warranted, if clinically indicated. 4. There are calcifications of the aortic valve and mitral annulus. Echocardiographic correlation for evaluation of potential valvular dysfunction may be warranted if clinically indicated. 5. Cardiomegaly with left atrial dilatation 6. Additional incidental findings, as above. Electronically Signed   By: Vinnie Langton M.D.   On: 09/04/2020 11:34      Assessment and plan- Patient is a 74 y.o. male with castrate resistant prostate cancer with progression on docetaxel Zytiga and Provenge  I have reviewed CT chest abdomen pelvis as well  as bone scan images independently and discussed findings with the patient.The rising PSA as well as scans show progressive disease in his lymph nodes now with bulky intra-abdominal adenopathy as well as mediastinal adenopathy and new areas of bone metastases.  I have asked him to stop taking his Zytiga at this time.  Given the degree of progression I will switch him to third line cabazitaxel given at 20 mg per metered squared IV every 6 3 weeks up to 6 cycles.  Discussed risks and benefits of cabazitaxel including all but not limited to nausea, vomiting, low blood counts, risk of infections and hospitalization.  Risk of peripheral neuropathy associated with cabazitaxel.  Treatment will be given with a palliative intent.  Patient understands and agrees to proceed as planned.  I will see him back in 1 week's time after port placement to start first cycle of cabazitaxel  Biopsy had confirmed that this was a prostate adenocarcinoma and NGS testing did not show any actionable mutations.   Visit Diagnosis 1. Malignant neoplasm of prostate ([place])   2. Goals of care, counseling/discussion   3. Long term (current) use of bisphosphonates      Dr. Randa Evens, MD, MPH [facility] [phone] 09/10/2020 1:33 PM

## 2020-09-10 NOTE — Progress Notes (Signed)
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 . He again began to have a rising PSA in 2010 and underwent orchiectomy in 2010.He had biochemical recurrence in 2017 and was treated with IMRT  He underwent CT abdomen and pelvis in May 2018which showed abnormal periaortic and right common iliac lymph nodes. Index aortocaval lymph node 1.4 cm which was new as compared to February 2014 and suspicious for malignancy. Bone scan did not reveal any evidence of bony metastatic disease.PSA went up from 3.9 in January 20 18-7.8 in April 2018. 13.3 in July 2018 when docetaxel was initiated. Doubling time at that time was about 3 months  Docetaxel chemo initiated for CRPC with ln mets and rapid doubling PSA in august 2018.Patient completed 5 cycles of docetaxel on 09/27/2017. Cycle #6 was not given due to progressive fatigue and cytopenias as well as worsening neuropathy. Cycle 5 of docetaxel was dose reduced to 60 mg/m square.Patient never had normalization of his PSA following docetaxel and the lowest PSA after docetaxel was 5.64 in December 2018  In April 2019 patient noted to have rising PSAto 19.02and increase in the size of retroperitoneal LN. Patient therefore started second line zytiga.  Patient has remained on Zytiga since April 2019. The lowest PSA value on Zytiga was 6.1 in August 2020 and since then there has been a small but steady increase in his  PSA.Most recent imaging in November 2020 showed increase in the size of retroperitoneal lymph nodes from 0.7 cm to 1.1 cm. New periaortic lymph node 1.2 cm. Slight progression of sclerotic bone metastases involving the left scapula as well as T4 and T7. Most recent PSA on 03/07/2020 was 23.9.Given the subtle progression Zytiga was not changed at that time.  repat LN biopsy showed adenocarcinoma prostate. NGSPD-L1 1%. PTEN C388C (R130X)  Patient went to [facility] for second opinion and was recommended Provenge given the rise in PSA and mild radiological progression   Interval history- Patient is doing well and denies any pain at this time. Appetite is stable  ECOG PS- 1 Pain scale- 0   Review of systems- Review of Systems  Constitutional: Positive for malaise/fatigue. Negative for chills, fever and weight loss.  HENT: Negative for congestion, ear discharge and nosebleeds.   Eyes: Negative for blurred vision.  Respiratory: Negative for cough, hemoptysis, sputum production, shortness of breath and wheezing.   Cardiovascular: Negative for chest pain, palpitations, orthopnea and claudication.  Gastrointestinal: Negative for abdominal pain, blood in stool, constipation, diarrhea, heartburn, melena, nausea and vomiting.  Genitourinary: Negative for dysuria, flank pain, frequency, hematuria and urgency.  Musculoskeletal: Negative for back pain, joint pain and myalgias.  Skin: Negative for rash.  Neurological: Negative for dizziness, tingling, focal weakness, seizures, weakness and headaches.  Endo/Heme/Allergies: Does not bruise/bleed easily.  Psychiatric/Behavioral: Negative for depression and suicidal ideas. The patient does not have insomnia.       Allergies  Allergen Reactions  . Latex Rash    Sensitivity, not allergy.     Past Medical History:  Diagnosis Date  . Arthritis, degenerative 10/30/2015  . Atrial fibrillation ([place]) 10/30/2015  . Bladder spasm 06/12/2013  . Carcinoma  of prostate ([place]) 10/30/2015  . Cardiomyopathy ([place]) 09/10/2014   Overview:  EF 35%,4/15   . Cerebrovascular accident (CVA) ([place]) 10/30/2015   Overview:  Left embolic [id number]   . Difficult or painful urination 11/21/2013  . Dysrhythmia   . Gout 10/30/2015  . History of kidney stones   . Malignant neoplasm of prostate ([place]) 09/05/2012  . Sinoatrial node dysfunction ([facility]) 10/30/2015   Overview:  DDD pacemaker, 1996      Past Surgical History:  Procedure Laterality Date  . castration  10/2009  . CATARACT EXTRACTION W/ INTRAOCULAR LENS  IMPLANT, BILATERAL Bilateral 2016   right eye done then the left one done 3 months later in 2016  . COLONOSCOPY WITH PROPOFOL N/A 11/21/2019   Procedure: COLONOSCOPY WITH PROPOFOL;  Surgeon: Robert Bellow, MD;  Location: [facility] ENDOSCOPY;  Service: Endoscopy;  Laterality: N/A;  . INGUINAL HERNIA REPAIR Left   . INSERT / REPLACE / REMOVE PACEMAKER    . IR FLUORO GUIDE PORT INSERTION RIGHT  07/01/2017  . IR REMOVAL TUN ACCESS W/ PORT W/O FL MOD SED  06/20/2020  . IR REMOVAL TUN CV CATH W/O FL  08/01/2020  . JOINT REPLACEMENT Bilateral 1980   hips  . left knee replacement  2014  . PACEMAKER INSERTION    . PROSTATECTOMY    . REPLACEMENT TOTAL HIP W/  RESURFACING IMPLANTS  1997   bilateral-   . REPLACEMENT TOTAL KNEE Bilateral   . testes removal     per pt   . TONSILLECTOMY AND ADENOIDECTOMY    . TRANSURETHRAL RESECTION OF BLADDER TUMOR N/A 12/31/2015   Procedure: BLADDER BIOPSY;  Surgeon: Nickie Retort, MD;  Location: [facility] ORS;  Service: Urology;  Laterality: N/A;  . Urinary incontinence valve     AMS    Social History   Socioeconomic History  . Marital status: Married    Spouse name: Not on file  . Number of children: Not on file  . Years of education: Not on file  . Highest education level: Not on file  Occupational History  . Not on file  Tobacco Use  . Smoking status: Never Smoker  . Smokeless tobacco: Never Used  Vaping Use  .  Vaping Use: Never used  Substance and Sexual Activity  . Alcohol use: No    Alcohol/week: 0.0 standard drinks  . Drug use: No  . Sexual activity: Never  Other Topics Concern  . Not on file  Social History Narrative  . Not on file   Social Determinants of Health   Financial Resource Strain:   . Difficulty of Paying Living Expenses: Not on file  Food Insecurity:   . Worried About Charity fundraiser in the Last Year: Not on file  . Ran Out of Food in the Last Year: Not on file  Transportation Needs:   . Lack of Transportation (Medical): Not on file  . Lack of Transportation (Non-Medical): Not on file  Physical Activity:   . Days of Exercise per Week: Not on file  . Minutes of Exercise per Session: Not on file  Stress:   . Feeling of Stress : Not on file  Social Connections:   . Frequency of Communication with Friends and Family: Not on file  . Frequency of Social Gatherings with Friends and Family: Not on file  . Attends Religious Services: Not  on file  . Active Member of Clubs or Organizations: Not on file  . Attends Archivist Meetings: Not on file  . Marital Status: Not on file  Intimate Partner Violence:   . Fear of Current or Ex-Partner: Not on file  . Emotionally Abused: Not on file  . Physically Abused: Not on file  . Sexually Abused: Not on file    Family History  Problem Relation Age of Onset  . Cancer Mother   . Arthritis Mother   . Heart attack Father   . Arthritis Father   . Transient ischemic attack Brother   . Prostate cancer Neg Hx   . Bladder Cancer Neg Hx      Current Outpatient Medications:  .  Abiraterone Acetate (ZYTIGA PO), Take 4 capsules by mouth daily. , Disp: , Rfl:  .  acetaminophen (TYLENOL) 325 MG tablet, Take 650 mg by mouth 2 (two) times daily., Disp: , Rfl:  .  allopurinol (ZYLOPRIM) 300 MG tablet, Take by mouth daily. In am., Disp: , Rfl:  .  bisacodyl (DULCOLAX) 5 MG EC tablet, Take 5 mg by mouth 2 (two) times a day.,  Disp: , Rfl:  .  calcium carbonate (CALCIUM 600) 600 MG TABS tablet, Take 600 mg by mouth 2 (two) times daily with a meal., Disp: , Rfl:  .  cyanocobalamin (,VITAMIN B-12,) 1000 MCG/ML injection, Inject 1,000 mcg into the muscle every 30 (thirty) days. , Disp: , Rfl:  .  diltiazem (CARDIZEM CD) 180 MG 24 hr capsule, Take 180 mg by mouth daily., Disp: , Rfl:  .  furosemide (LASIX) 20 MG tablet, Take 20 mg by mouth daily., Disp: , Rfl:  .  lidocaine-prilocaine (EMLA) cream, , Disp: , Rfl:  .  phentermine 15 MG capsule, Take 15 mg by mouth every morning., Disp: , Rfl:  .  potassium chloride (KLOR-CON) 10 MEQ tablet, TAKE 2 TABLETS BY MOUTH ONCE DAILY, Disp: 60 tablet, Rfl: 2 .  predniSONE (DELTASONE) 5 MG tablet, TAKE 1 TABLET (5 MG TOTAL) BY MOUTH 2 TIMES DAILY WITH A MEAL., Disp: 60 tablet, Rfl: 5 .  simvastatin (ZOCOR) 40 MG tablet, Take 40 mg by mouth daily at 6 PM. , Disp: , Rfl:  .  warfarin (COUMADIN) 3 MG tablet, Take 3 mg by mouth daily. Takes on Tuesday and thursday, Disp: , Rfl:  .  warfarin (COUMADIN) 6 MG tablet, Take 1 tablet by mouth. Monday, wed, Friday, Saturday and Sunday, Disp: , Rfl:  No current facility-administered medications for this visit.  Facility-Administered Medications Ordered in Other Visits:  .  denosumab (XGEVA) injection 120 mg, 120 mg, Subcutaneous, Q28 days, Sindy Guadeloupe, MD, 120 mg at 11/15/19 1158  Physical exam: There were no vitals filed for this visit. Physical Exam Constitutional:      General: He is not in acute distress. Cardiovascular:     Rate and Rhythm: Normal rate and regular rhythm.     Heart sounds: Normal heart sounds.  Pulmonary:     Effort: Pulmonary effort is normal.     Breath sounds: Normal breath sounds.  Abdominal:     General: Bowel sounds are normal.     Palpations: Abdomen is soft.  Skin:    General: Skin is warm and dry.     Coloration: Jaundice:    Neurological:     Mental Status: He is alert and oriented to person,  place, and time.      CMP Latest Ref Rng & Units  09/08/2020  Glucose 70 - 99 mg/dL 91  BUN 8 - 23 mg/dL 22  Creatinine 0.61 - 1.24 mg/dL 1.12  Sodium 135 - 145 mmol/L 135  Potassium 3.5 - 5.1 mmol/L 3.9  Chloride 98 - 111 mmol/L 102  CO2 22 - 32 mmol/L 26  Calcium 8.9 - 10.3 mg/dL 9.0  Total Protein 6.5 - 8.1 g/dL 7.1  Total Bilirubin 0.3 - 1.2 mg/dL 1.2  Alkaline Phos 38 - 126 U/L 69  AST 15 - 41 U/L 19  ALT 0 - 44 U/L 11   CBC Latest Ref Rng & Units 09/08/2020  WBC 4.0 - 10.5 K/uL 5.3  Hemoglobin 13.0 - 17.0 g/dL 12.4(L)  Hematocrit 39 - 52 % 36.2(L)  Platelets 150 - 400 K/uL 147(L)    No images are attached to the encounter.  NM Bone Scan Whole Body  Result Date: 09/05/2020 CLINICAL DATA:  Prostate cancer, restaging examination EXAM: NUCLEAR MEDICINE WHOLE BODY BONE SCAN TECHNIQUE: Whole body anterior and posterior images were obtained approximately 3 hours after intravenous injection of radiopharmaceutical. RADIOPHARMACEUTICALS:  23.044 mCi Technetium-71mMDP IV COMPARISON:  10/23/2019. Findings are also correlated with CT examinations of 09/04/2020 and 03/19/2020 FINDINGS: There is increasing uptake of radiotracer involving the left frontal bone and inferior aspect of the left scapula. Additionally, there is new uptake of radiotracer involving the right fifth rib anteriorly, medial right acetabulum, and T3, T5, and T7 vertebral bodies. Uptake within the a posterior left right third rib, however, appears to have resolved in the interval. Uptake involving the maxilla anteriorly appears unchanged, possibly related to periodontal disease. Extensive uptake within the shoulders bilaterally and, to a lesser extent, within the sternoclavicular joints bilaterally is likely degenerative in nature. Uptake within the feet bilaterally is likely degenerative in nature. Uptake within the left superior acetabulum appears to correspond to an area of increasing osteolysis anterosuperior to the  acetabular component of the left total hip arthroplasty likely representing an aggressive granulocytic reaction. Normal soft tissue distribution. Normal uptake and excretion within the kidneys and bladder. IMPRESSION: Mixed response to therapy with development of multiple new metastases, progression of disease involving existing metastases within the left frontal bone and left scapula, but resolution of a single metastasis within the posterior right third rib. Focal uptake of radiotracer involving an area of osteolysis surrounding the left hip prosthesis acetabular component. A a superimposed pathologic fracture is not excluded and dedicated CT examination of the left hip may be helpful for further evaluation. Electronically Signed   By: AFidela SalisburyMD   On: 09/05/2020 01:46   CT CHEST ABDOMEN PELVIS W CONTRAST  Result Date: 09/04/2020 CLINICAL DATA:  74year old male with history of castrate resistant metastatic prostate cancer, currently on Zytiga and prednisone. EXAM: CT CHEST, ABDOMEN, AND PELVIS WITH CONTRAST TECHNIQUE: Multidetector CT imaging of the chest, abdomen and pelvis was performed following the standard protocol during bolus administration of intravenous contrast. CONTRAST:  1027mOMNIPAQUE IOHEXOL 350 MG/ML SOLN COMPARISON:  CT the chest, abdomen and pelvis 03/19/2020. FINDINGS: CT CHEST FINDINGS Cardiovascular: Heart size is mildly enlarged with left atrial dilatation. There is no significant pericardial fluid, thickening or pericardial calcification. There is aortic atherosclerosis, as well as atherosclerosis of the great vessels of the mediastinum and the coronary arteries, including calcified atherosclerotic plaque in the left main, left anterior descending, left circumflex and right coronary arteries. Mild calcifications of the aortic valve and mitral annulus. Left-sided pacemaker/AICD with lead tips terminating in the right atrial appendage and right  ventricular apex. Mediastinum/Nodes:  Multiple prominent borderline enlarged and mildly enlarged mediastinal lymph nodes, largest of which is in the subcarinal nodal station where there is a node or cluster of nodes measuring up to 3.1 cm in short axis. Esophagus is unremarkable in appearance. No axillary lymphadenopathy. Lungs/Pleura: No suspicious appearing pulmonary nodules or masses are noted. No acute consolidative airspace disease. No pleural effusions. Areas of chronic scarring and architectural distortion in the lower lobes of the lungs bilaterally, similar to the prior study. Chronic elevation of the left hemidiaphragm, similar to the prior study. Musculoskeletal: Multiple predominantly sclerotic lesions are again noted throughout the visualized axial and appendicular skeleton, some of which have increased in size, with a good example in the anterior aspect of T4 (sagittal image 110 of series 6) measuring 1.7 cm in diameter (previously 8 mm). Chronic compression fracture of T3 and T12, most severe at T12 where there is with approximately 75% loss of anterior vertebral body height, similar to prior examination. CT ABDOMEN PELVIS FINDINGS Hepatobiliary: No suspicious cystic or solid hepatic lesions. No intra or extrahepatic biliary ductal dilatation. Gallbladder is normal in appearance. Pancreas: No pancreatic mass. No pancreatic ductal dilatation. No pancreatic or peripancreatic fluid collections or inflammatory changes. Spleen: Unremarkable. Adrenals/Urinary Tract: No suspicious renal lesions. No hydroureteronephrosis. Urinary bladder is normal in appearance. Bilateral adrenal glands are normal in appearance. Stomach/Bowel: Normal appearance of the stomach. No pathologic dilatation of small bowel or colon. Numerous colonic diverticulae are noted, particularly in the sigmoid colon, without surrounding inflammatory changes to suggest an acute diverticulitis at this time. The appendix is not confidently identified and may be surgically absent.  Regardless, there are no inflammatory changes noted adjacent to the cecum to suggest the presence of an acute appendicitis at this time. Vascular/Lymphatic: Aortic atherosclerosis, without evidence of aneurysm or dissection in the abdominal or pelvic vasculature. Markedly worsened lymphadenopathy the throughout the retroperitoneum and pelvis. The largest of these retroperitoneal nodes is a bulky nodal conglomerate in the left para-aortic nodal station near the left renal hilum (axial image 74 of series 2) measuring 7.4 x 6.3 cm (previously 2.3 x 2.8 cm). The largest pelvic lymph node is in the right common iliac nodal station (axial image 100 of series 2) currently measuring 2.1 cm in short axis (previously 1.2 cm). Reproductive: Status post radical prostatectomy. No unexpected soft tissue mass in the low anatomic pelvis. Other: No significant volume of ascites.  No pneumoperitoneum. Musculoskeletal: Status post bilateral hip arthroplasty. There are no aggressive appearing lytic or blastic lesions noted in the visualized portions of the skeleton. IMPRESSION: 1. Today's study demonstrates progression of metastatic disease, most notable for increasing lymphadenopathy in the mediastinum, retroperitoneum and pelvis, as well as enlarging osseous lesions, as detailed above. 2. Colonic diverticulosis without evidence of acute diverticulitis at this time. 3. Aortic atherosclerosis, in addition to left main and 3 vessel coronary artery disease. Assessment for potential risk factor modification, dietary therapy or pharmacologic therapy may be warranted, if clinically indicated. 4. There are calcifications of the aortic valve and mitral annulus. Echocardiographic correlation for evaluation of potential valvular dysfunction may be warranted if clinically indicated. 5. Cardiomegaly with left atrial dilatation 6. Additional incidental findings, as above. Electronically Signed   By: Vinnie Langton M.D.   On: 09/04/2020 11:34      Assessment and plan- Patient is a 74 y.o. male with castrate resistant prostate cancer with progression on docetaxel Zytiga and Provenge  I have reviewed CT chest abdomen pelvis as well  as bone scan images independently and discussed findings with the patient.The rising PSA as well as scans show progressive disease in his lymph nodes now with bulky intra-abdominal adenopathy as well as mediastinal adenopathy and new areas of bone metastases.  I have asked him to stop taking his Zytiga at this time.  Given the degree of progression I will switch him to third line cabazitaxel given at 20 mg per metered squared IV every 6 3 weeks up to 6 cycles.  Discussed risks and benefits of cabazitaxel including all but not limited to nausea, vomiting, low blood counts, risk of infections and hospitalization.  Risk of peripheral neuropathy associated with cabazitaxel.  Treatment will be given with a palliative intent.  Patient understands and agrees to proceed as planned.  I will see him back in 1 week's time after port placement to start first cycle of cabazitaxel  Biopsy had confirmed that this was a prostate adenocarcinoma and NGS testing did not show any actionable mutations.   Visit Diagnosis 1. Malignant neoplasm of prostate ([place])   2. Goals of care, counseling/discussion   3. Long term (current) use of bisphosphonates      Dr. Randa Evens, MD, MPH [facility] [phone] 09/10/2020 1:33 PM

## 2020-09-11 ENCOUNTER — Ambulatory Visit: Admission: RE | Admit: 2020-09-11 | Payer: PPO | Source: Home / Self Care | Admitting: Vascular Surgery

## 2020-09-11 ENCOUNTER — Other Ambulatory Visit (INDEPENDENT_AMBULATORY_CARE_PROVIDER_SITE_OTHER): Payer: Self-pay | Admitting: Nurse Practitioner

## 2020-09-11 ENCOUNTER — Other Ambulatory Visit: Payer: Self-pay

## 2020-09-11 ENCOUNTER — Ambulatory Visit
Admission: RE | Admit: 2020-09-11 | Discharge: 2020-09-11 | Disposition: A | Discharge status: Home / Self Care | Payer: PPO | Attending: Vascular Surgery | Admitting: Vascular Surgery

## 2020-09-11 ENCOUNTER — Encounter: Payer: Self-pay | Admitting: Vascular Surgery

## 2020-09-11 ENCOUNTER — Encounter
Admission: RE | Disposition: A | Discharge status: Home / Self Care | Payer: Self-pay | Source: Home / Self Care | Attending: Vascular Surgery

## 2020-09-11 ENCOUNTER — Encounter: Admission: RE | Payer: Self-pay | Source: Home / Self Care

## 2020-09-11 DIAGNOSIS — Z7952 Long term (current) use of systemic steroids: Secondary | ICD-10-CM | POA: Insufficient documentation

## 2020-09-11 DIAGNOSIS — Z79899 Other long term (current) drug therapy: Secondary | ICD-10-CM | POA: Diagnosis not present

## 2020-09-11 DIAGNOSIS — I429 Cardiomyopathy, unspecified: Secondary | ICD-10-CM | POA: Insufficient documentation

## 2020-09-11 DIAGNOSIS — C7951 Secondary malignant neoplasm of bone: Secondary | ICD-10-CM | POA: Diagnosis not present

## 2020-09-11 DIAGNOSIS — Z7901 Long term (current) use of anticoagulants: Secondary | ICD-10-CM | POA: Insufficient documentation

## 2020-09-11 DIAGNOSIS — M109 Gout, unspecified: Secondary | ICD-10-CM | POA: Insufficient documentation

## 2020-09-11 DIAGNOSIS — I4891 Unspecified atrial fibrillation: Secondary | ICD-10-CM | POA: Insufficient documentation

## 2020-09-11 DIAGNOSIS — C61 Malignant neoplasm of prostate: Secondary | ICD-10-CM

## 2020-09-11 DIAGNOSIS — Z8673 Personal history of transient ischemic attack (TIA), and cerebral infarction without residual deficits: Secondary | ICD-10-CM | POA: Insufficient documentation

## 2020-09-11 DIAGNOSIS — Z950 Presence of cardiac pacemaker: Secondary | ICD-10-CM | POA: Insufficient documentation

## 2020-09-11 HISTORY — PX: PORTA CATH INSERTION: CATH118285

## 2020-09-11 LAB — PROTIME-INR
INR: 1.7 — ABNORMAL HIGH (ref 0.8–1.2)
Prothrombin Time: 19 seconds — ABNORMAL HIGH (ref 11.4–15.2)

## 2020-09-11 SURGERY — PORTA CATH INSERTION
Anesthesia: Moderate Sedation

## 2020-09-11 MED ORDER — HYDROMORPHONE HCL 1 MG/ML IJ SOLN: 1.0000 mg | Freq: Once | INTRAMUSCULAR | Status: DC | PRN

## 2020-09-11 MED ORDER — DIPHENHYDRAMINE HCL 50 MG/ML IJ SOLN: 50.0000 mg | Freq: Once | INTRAMUSCULAR | Status: DC | PRN

## 2020-09-11 MED ORDER — SODIUM CHLORIDE 0.9 % IV SOLN
Freq: Once | INTRAVENOUS | Status: DC
  Filled 2020-09-11 (×2): qty 2

## 2020-09-11 MED ORDER — SODIUM CHLORIDE 0.9 % IV SOLN: INTRAVENOUS | Status: DC

## 2020-09-11 MED ORDER — CHLORHEXIDINE GLUCONATE CLOTH 2 % EX PADS
2.0000 | MEDICATED_PAD | Freq: Once | CUTANEOUS | Status: AC
  Administered 2020-09-11: 2 via TOPICAL

## 2020-09-11 MED ORDER — FENTANYL CITRATE (PF) 100 MCG/2ML IJ SOLN
INTRAMUSCULAR | Status: DC
Start: 2020-09-11 — End: 2020-09-11
  Filled 2020-09-11: qty 2

## 2020-09-11 MED ORDER — ONDANSETRON HCL 4 MG/2ML IJ SOLN: 4.0000 mg | Freq: Four times a day (QID) | INTRAMUSCULAR | Status: DC | PRN

## 2020-09-11 MED ORDER — FENTANYL CITRATE (PF) 100 MCG/2ML IJ SOLN
INTRAMUSCULAR | Status: DC | PRN
Start: 2020-09-11 — End: 2020-09-11
  Administered 2020-09-11: 50 ug via INTRAVENOUS
  Administered 2020-09-11: 25 ug via INTRAVENOUS

## 2020-09-11 MED ORDER — MIDAZOLAM HCL 2 MG/2ML IJ SOLN
INTRAMUSCULAR | Status: DC | PRN
  Administered 2020-09-11: 2 mg via INTRAVENOUS
  Administered 2020-09-11: 1 mg via INTRAVENOUS

## 2020-09-11 MED ORDER — CEFAZOLIN SODIUM-DEXTROSE 2-4 GM/100ML-% IV SOLN: 2.0000 g | Freq: Once | INTRAVENOUS | Status: AC

## 2020-09-11 MED ORDER — MIDAZOLAM HCL 5 MG/5ML IJ SOLN
INTRAMUSCULAR | Status: AC
  Filled 2020-09-11: qty 5

## 2020-09-11 MED ORDER — MIDAZOLAM HCL 2 MG/ML PO SYRP: 8.0000 mg | ORAL_SOLUTION | Freq: Once | ORAL | Status: DC | PRN

## 2020-09-11 MED ORDER — METHYLPREDNISOLONE SODIUM SUCC 125 MG IJ SOLR: 125.0000 mg | Freq: Once | INTRAMUSCULAR | Status: DC | PRN

## 2020-09-11 MED ORDER — FAMOTIDINE 20 MG PO TABS: 40.0000 mg | ORAL_TABLET | Freq: Once | ORAL | Status: DC | PRN

## 2020-09-11 MED ORDER — CEFAZOLIN SODIUM-DEXTROSE 2-4 GM/100ML-% IV SOLN
INTRAVENOUS | Status: AC
  Administered 2020-09-11: 2 g via INTRAVENOUS
  Filled 2020-09-11: qty 100

## 2020-09-11 SURGICAL SUPPLY — 10 items
ADH SKN CLS APL DERMABOND .7 (GAUZE/BANDAGES/DRESSINGS) ×1
DERMABOND ADVANCED (GAUZE/BANDAGES/DRESSINGS) ×2
DERMABOND ADVANCED .7 DNX12 (GAUZE/BANDAGES/DRESSINGS) IMPLANT
KIT PORT POWER 8FR ISP CVUE (Port) ×2 IMPLANT
PACK ANGIOGRAPHY (CUSTOM PROCEDURE TRAY) ×3 IMPLANT
SUT MNCRL 4-0 (SUTURE) ×3
SUT MNCRL 4-0 27XMFL (SUTURE) ×1
SUT PROLENE 0 CT 1 30 (SUTURE) ×2 IMPLANT
SUT VICRYL+ 3-0 36IN CT-1 (SUTURE) ×2 IMPLANT
SUTURE MNCRL 4-0 27XMF (SUTURE) IMPLANT

## 2020-09-11 NOTE — Op Note (Signed)
      [facility]       Operative Note  Date: 09/11/2020  Preoperative diagnosis:  1. Prostate cancer  Postoperative diagnosis:  Same as above  Procedures: #1. Ultrasound guidance for vascular access to the right internal jugular vein. #2. Fluoroscopic guidance for placement of catheter. #3. Placement of CT compatible Port-A-Cath, right internal jugular vein.  Surgeon: Leotis Pain, MD.   Anesthesia: Local with moderate conscious sedation for approximately 32  minutes using 3 mg of Versed and 75 mcg of Fentanyl  Fluoroscopy time: less than 1 minute  Contrast used: 0  Estimated blood loss: 10 cc  Indication for the procedure:  The patient is a 74 y.o.male with prostate cancer.  The patient needs a Port-A-Cath for durable venous access, chemotherapy, lab draws, and CT scans. We are asked to place this. Risks and benefits were discussed and informed consent was obtained.  Description of procedure: The patient was brought to the vascular and interventional radiology suite.  Moderate conscious sedation was administered throughout the procedure during a face to face encounter with the patient with my supervision of the RN administering medicines and monitoring the patient's vital signs, pulse oximetry, telemetry and mental status throughout from the start of the procedure until the patient was taken to the recovery room. The right neck chest and shoulder were sterilely prepped and draped, and a sterile surgical field was created. Ultrasound was used to help visualize a patent right internal jugular vein. This was then accessed under direct ultrasound guidance without difficulty with the Seldinger needle and a permanent image was recorded. A J-wire was placed. After skin nick and dilatation, the peel-away sheath was then placed over the wire. I then anesthetized an area under the clavicle approximately 1-2 fingerbreadths. A transverse incision was created and an inferior pocket  was created with electrocautery and blunt dissection. The port was then brought onto the field, placed into the pocket and secured to the chest wall with 2 Prolene sutures. The catheter was connected to the port and tunneled from the subclavicular incision to the access site. Fluoroscopic guidance was then used to cut the catheter to an appropriate length. The catheter was then placed through the peel-away sheath and the peel-away sheath was removed. The catheter tip was parked in excellent location under fluorocoscopic guidance in the cavoatrial junction. The pocket was then irrigated with antibiotic impregnated saline and the wound was closed with a running 3-0 Vicryl and a 4-0 Monocryl. The access incision was closed with a single 4-0 Monocryl. The Huber needle was used to withdraw blood and flush the port with heparinized saline. Dermabond was then placed as a dressing. The patient tolerated the procedure well and was taken to the recovery room in stable condition.   Leotis Pain 09/11/2020 1:14 PM   This note was created with Dragon Medical transcription system. Any errors in dictation are purely unintentional.

## 2020-09-11 NOTE — Discharge Instructions (Signed)
Moderate Conscious Sedation, Adult, Care After These instructions provide you with information about caring for yourself after your procedure. Your health care provider may also give you more specific instructions. Your treatment has been planned according to current medical practices, but problems sometimes occur. Call your health care provider if you have any problems or questions after your procedure. What can I expect after the procedure? After your procedure, it is common:  To feel sleepy for several hours.  To feel clumsy and have poor balance for several hours.  To have poor judgment for several hours.  To vomit if you eat too soon. Follow these instructions at home: For at least 24 hours after the procedure:   Do not: ? Participate in activities where you could fall or become injured. ? Drive. ? Use heavy machinery. ? Drink alcohol. ? Take sleeping pills or medicines that cause drowsiness. ? Make important decisions or sign legal documents. ? Take care of children on your own.  Rest. Eating and drinking  Follow the diet recommended by your health care provider.  If you vomit: ? Drink water, juice, or soup when you can drink without vomiting. ? Make sure you have little or no nausea before eating solid foods. General instructions  Have a responsible adult stay with you until you are awake and alert.  Take over-the-counter and prescription medicines only as told by your health care provider.  If you smoke, do not smoke without supervision.  Keep all follow-up visits as told by your health care provider. This is important. Contact a health care provider if:  You keep feeling nauseous or you keep vomiting.  You feel light-headed.  You develop a rash.  You have a fever. Get help right away if:  You have trouble breathing. This information is not intended to replace advice given to you by your health care provider. Make sure you discuss any questions you have  with your health care provider. Document Revised: 11/11/2017 Document Reviewed: 03/20/2016 Elsevier Patient Education  2020 [employer] Implanted Port Home Guide An implanted port is a device that is placed under the skin. It is usually placed in the chest. The device can be used to give IV medicine, to take blood, or for dialysis. You may have an implanted port if:  You need IV medicine that would be irritating to the small veins in your hands or arms.  You need IV medicines, such as antibiotics, for a long period of time.  You need IV nutrition for a long period of time.  You need dialysis. Having a port means that your health care provider will not need to use the veins in your arms for these procedures. You may have fewer limitations when using a port than you would if you used other types of long-term IVs, and you will likely be able to return to normal activities after your incision heals. An implanted port has two main parts:  Reservoir. The reservoir is the part where a needle is inserted to give medicines or draw blood. The reservoir is round. After it is placed, it appears as a small, raised area under your skin.  Catheter. The catheter is a thin, flexible tube that connects the reservoir to a vein. Medicine that is inserted into the reservoir goes into the catheter and then into the vein. How is my port accessed? To access your port:  A numbing cream may be placed on the skin over the port site.  Your health care provider   will put on a mask and sterile gloves.  The skin over your port will be cleaned carefully with a germ-killing soap and allowed to dry.  Your health care provider will gently pinch the port and insert a needle into it.  Your health care provider will check for a blood return to make sure the port is in the vein and is not clogged.  If your port needs to remain accessed to get medicine continuously (constant infusion), your health care provider will place  a clear bandage (dressing) over the needle site. The dressing and needle will need to be changed every week, or as told by your health care provider. What is flushing? Flushing helps keep the port from getting clogged. Follow instructions from your health care provider about how and when to flush the port. Ports are usually flushed with saline solution or a medicine called heparin. The need for flushing will depend on how the port is used:  If the port is only used from time to time to give medicines or draw blood, the port may need to be flushed: ? Before and after medicines have been given. ? Before and after blood has been drawn. ? As part of routine maintenance. Flushing may be recommended every 4-6 weeks.  If a constant infusion is running, the port may not need to be flushed.  Throw away any syringes in a disposal container that is meant for sharp items (sharps container). You can buy a sharps container from a pharmacy, or you can make one by using an empty hard plastic bottle with a cover. How long will my port stay implanted? The port can stay in for as long as your health care provider thinks it is needed. When it is time for the port to come out, a surgery will be done to remove it. The surgery will be similar to the procedure that was done to put the port in. Follow these instructions at home:   Flush your port as told by your health care provider.  If you need an infusion over several days, follow instructions from your health care provider about how to take care of your port site. Make sure you: ? Wash your hands with soap and water before you change your dressing. If soap and water are not available, use alcohol-based hand sanitizer. ? Change your dressing as told by your health care provider. ? Place any used dressings or infusion bags into a plastic bag. Throw that bag in the trash. ? Keep the dressing that covers the needle clean and dry. Do not get it wet. ? Do not use  scissors or sharp objects near the tube. ? Keep the tube clamped, unless it is being used.  Check your port site every day for signs of infection. Check for: ? Redness, swelling, or pain. ? Fluid or blood. ? Pus or a bad smell.  Protect the skin around the port site. ? Avoid wearing bra straps that rub or irritate the site. ? Protect the skin around your port from seat belts. Place a soft pad over your chest if needed.  Bathe or shower as told by your health care provider. The site may get wet as long as you are not actively receiving an infusion.  Return to your normal activities as told by your health care provider. Ask your health care provider what activities are safe for you.  Carry a medical alert card or wear a medical alert bracelet at all times. This will   let health care providers know that you have an implanted port in case of an emergency. Get help right away if:  You have redness, swelling, or pain at the port site.  You have fluid or blood coming from your port site.  You have pus or a bad smell coming from the port site.  You have a fever. Summary  Implanted ports are usually placed in the chest for long-term IV access.  Follow instructions from your health care provider about flushing the port and changing bandages (dressings).  Take care of the area around your port by avoiding clothing that puts pressure on the area, and by watching for signs of infection.  Protect the skin around your port from seat belts. Place a soft pad over your chest if needed.  Get help right away if you have a fever or you have redness, swelling, pain, drainage, or a bad smell at the port site. This information is not intended to replace advice given to you by your health care provider. Make sure you discuss any questions you have with your health care provider. Document Revised: 03/23/2019 Document Reviewed: 01/01/2017 Elsevier Patient Education  2020 [employer]  

## 2020-09-11 NOTE — Interval H&P Note (Signed)
History and Physical Interval Note:  09/11/2020 12:40 PM  Jeremy Carney  has presented today for surgery, with the diagnosis of Porta Cath Insertion   Prostate Cancer   Pt to have Covid test on 09-09-20.  The various methods of treatment have been discussed with the patient and family. After consideration of risks, benefits and other options for treatment, the patient has consented to  Procedure(s): PORTA CATH INSERTION (N/A) as a surgical intervention.  The patient's history has been reviewed, patient examined, no change in status, stable for surgery.  I have reviewed the patient's chart and labs.  Questions were answered to the patient's satisfaction.     Leotis Pain

## 2020-09-15 ENCOUNTER — Other Ambulatory Visit: Payer: PPO

## 2020-09-16 ENCOUNTER — Inpatient Hospital Stay (HOSPITAL_BASED_OUTPATIENT_CLINIC_OR_DEPARTMENT_OTHER): Payer: PPO | Admitting: Oncology

## 2020-09-16 ENCOUNTER — Inpatient Hospital Stay: Payer: PPO | Attending: Oncology

## 2020-09-16 ENCOUNTER — Encounter: Payer: Self-pay | Admitting: Oncology

## 2020-09-16 ENCOUNTER — Other Ambulatory Visit: Payer: Self-pay

## 2020-09-16 ENCOUNTER — Inpatient Hospital Stay: Payer: PPO

## 2020-09-16 VITALS — BP 107/67 | HR 83 | Temp 98.1°F

## 2020-09-16 VITALS — BP 132/70 | HR 64 | Temp 96.8°F | Resp 16 | Wt 287.9 lb

## 2020-09-16 DIAGNOSIS — Z809 Family history of malignant neoplasm, unspecified: Secondary | ICD-10-CM | POA: Insufficient documentation

## 2020-09-16 DIAGNOSIS — R300 Dysuria: Secondary | ICD-10-CM | POA: Insufficient documentation

## 2020-09-16 DIAGNOSIS — Z5189 Encounter for other specified aftercare: Secondary | ICD-10-CM | POA: Diagnosis not present

## 2020-09-16 DIAGNOSIS — C61 Malignant neoplasm of prostate: Secondary | ICD-10-CM

## 2020-09-16 DIAGNOSIS — C7951 Secondary malignant neoplasm of bone: Secondary | ICD-10-CM | POA: Diagnosis not present

## 2020-09-16 DIAGNOSIS — Z5111 Encounter for antineoplastic chemotherapy: Secondary | ICD-10-CM | POA: Diagnosis not present

## 2020-09-16 DIAGNOSIS — Z9079 Acquired absence of other genital organ(s): Secondary | ICD-10-CM | POA: Diagnosis not present

## 2020-09-16 DIAGNOSIS — C772 Secondary and unspecified malignant neoplasm of intra-abdominal lymph nodes: Secondary | ICD-10-CM | POA: Diagnosis not present

## 2020-09-16 DIAGNOSIS — Z7952 Long term (current) use of systemic steroids: Secondary | ICD-10-CM | POA: Diagnosis not present

## 2020-09-16 LAB — CBC WITH DIFFERENTIAL/PLATELET
Abs Immature Granulocytes: 0.02 10*3/uL (ref 0.00–0.07)
Basophils Absolute: 0 10*3/uL (ref 0.0–0.1)
Basophils Relative: 0 %
Eosinophils Absolute: 0.2 10*3/uL (ref 0.0–0.5)
Eosinophils Relative: 5 %
HCT: 31.7 % — ABNORMAL LOW (ref 39.0–52.0)
Hemoglobin: 10.9 g/dL — ABNORMAL LOW (ref 13.0–17.0)
Immature Granulocytes: 0 %
Lymphocytes Relative: 17 %
Lymphs Abs: 0.8 10*3/uL (ref 0.7–4.0)
MCH: 31.1 pg (ref 26.0–34.0)
MCHC: 34.4 g/dL (ref 30.0–36.0)
MCV: 90.6 fL (ref 80.0–100.0)
Monocytes Absolute: 0.5 10*3/uL (ref 0.1–1.0)
Monocytes Relative: 10 %
Neutro Abs: 3 10*3/uL (ref 1.7–7.7)
Neutrophils Relative %: 68 %
Platelets: 118 10*3/uL — ABNORMAL LOW (ref 150–400)
RBC: 3.5 MIL/uL — ABNORMAL LOW (ref 4.22–5.81)
RDW: 14.6 % (ref 11.5–15.5)
WBC: 4.5 10*3/uL (ref 4.0–10.5)
nRBC: 0 % (ref 0.0–0.2)

## 2020-09-16 LAB — COMPREHENSIVE METABOLIC PANEL
ALT: 9 U/L (ref 0–44)
AST: 20 U/L (ref 15–41)
Albumin: 3.6 g/dL (ref 3.5–5.0)
Alkaline Phosphatase: 63 U/L (ref 38–126)
Anion gap: 9 (ref 5–15)
BUN: 24 mg/dL — ABNORMAL HIGH (ref 8–23)
CO2: 23 mmol/L (ref 22–32)
Calcium: 9 mg/dL (ref 8.9–10.3)
Chloride: 103 mmol/L (ref 98–111)
Creatinine, Ser: 0.94 mg/dL (ref 0.61–1.24)
GFR calc non Af Amer: >60 mL/min (ref >60)
Glucose, Bld: 104 mg/dL — ABNORMAL HIGH (ref 70–99)
Potassium: 3.9 mmol/L (ref 3.5–5.1)
Sodium: 135 mmol/L (ref 135–145)
Total Bilirubin: 1.1 mg/dL (ref 0.3–1.2)
Total Protein: 6.7 g/dL (ref 6.5–8.1)

## 2020-09-16 MED ORDER — PREDNISONE 5 MG PO TABS: ORAL_TABLET | ORAL | 5 refills | Status: DC

## 2020-09-16 MED ORDER — DIPHENHYDRAMINE HCL 50 MG/ML IJ SOLN
25.0000 mg | Freq: Once | INTRAMUSCULAR | Status: AC
  Administered 2020-09-16: 25 mg via INTRAVENOUS
  Filled 2020-09-16: qty 1

## 2020-09-16 MED ORDER — SODIUM CHLORIDE 0.9 % IV SOLN
10.0000 mg | Freq: Once | INTRAVENOUS | Status: AC
  Administered 2020-09-16: 10 mg via INTRAVENOUS
  Filled 2020-09-16: qty 10

## 2020-09-16 MED ORDER — PEGFILGRASTIM 6 MG/0.6ML ~~LOC~~ PSKT
6.0000 mg | PREFILLED_SYRINGE | Freq: Once | SUBCUTANEOUS | Status: AC
  Administered 2020-09-16: 6 mg via SUBCUTANEOUS
  Filled 2020-09-16: qty 0.6

## 2020-09-16 MED ORDER — SODIUM CHLORIDE 0.9% FLUSH
10.0000 mL | Freq: Once | INTRAVENOUS | Status: AC
  Administered 2020-09-16: 10 mL via INTRAVENOUS
  Filled 2020-09-16: qty 10

## 2020-09-16 MED ORDER — SODIUM CHLORIDE 0.9 % IV SOLN
15.0000 mg/m2 | Freq: Once | INTRAVENOUS | Status: AC
  Administered 2020-09-16: 39 mg via INTRAVENOUS
  Filled 2020-09-16: qty 3.9

## 2020-09-16 MED ORDER — HEPARIN SOD (PORK) LOCK FLUSH 100 UNIT/ML IV SOLN
500.0000 [IU] | Freq: Once | INTRAVENOUS | Status: AC | PRN
  Administered 2020-09-16: 500 [IU]
  Filled 2020-09-16: qty 5

## 2020-09-16 MED ORDER — SODIUM CHLORIDE 0.9 % IV SOLN
Freq: Once | INTRAVENOUS | Status: AC
  Filled 2020-09-16: qty 250

## 2020-09-16 MED ORDER — HEPARIN SOD (PORK) LOCK FLUSH 100 UNIT/ML IV SOLN
INTRAVENOUS | Status: AC
  Filled 2020-09-16: qty 5

## 2020-09-16 MED ORDER — FAMOTIDINE IN NACL 20-0.9 MG/50ML-% IV SOLN
20.0000 mg | Freq: Once | INTRAVENOUS | Status: AC
  Administered 2020-09-16: 20 mg via INTRAVENOUS
  Filled 2020-09-16: qty 50

## 2020-09-16 NOTE — Progress Notes (Signed)
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 . He again began to have a rising PSA in 2010 and underwent orchiectomy in 2010.He had biochemical recurrence in 2017 and was treated with IMRT  He underwent CT abdomen and pelvis in May 2018which showed abnormal periaortic and right common iliac lymph nodes. Index aortocaval lymph node 1.4 cm which was new as compared to February 2014 and suspicious for malignancy. Bone scan did not reveal any evidence of bony metastatic disease.PSA went up from 3.9 in January 20 18-7.8 in April 2018. 13.3 in July 2018 when docetaxel was initiated. Doubling time at that time was about 3 months  Docetaxel chemo initiated for CRPC with ln mets and rapid doubling PSA in august 2018.Patient completed 5 cycles of docetaxel on 09/27/2017. Cycle #6 was not given due to progressive fatigue and cytopenias as well as worsening neuropathy. Cycle 5 of docetaxel was dose reduced to 60 mg/m square.Patient never had normalization of his PSA following docetaxel and the lowest PSA after docetaxel was 5.64 in December 2018  In April 2019 patient noted to have rising PSAto 19.02and increase in the size of retroperitoneal LN. Patient therefore started second line zytiga.  Patient has remained on Zytiga since April 2019. The lowest PSA value on Zytiga was 6.1 in August 2020 and since then  there has been a small but steady increase in his PSA.Most recent imaging in November 2020 showed increase in the size of retroperitoneal lymph nodes from 0.7 cm to 1.1 cm. New periaortic lymph node 1.2 cm. Slight progression of sclerotic bone metastases involving the left scapula as well as T4 and T7. Most recent PSA on 03/07/2020 was 23.9.Given the subtle progression Zytiga was not changed at that time.  repat LN biopsy showed adenocarcinoma prostate. NGSPD-L1 1%. PTEN C388C (R130X)  Patient went to [facility] for second opinion and was recommended Provenge given the rise in PSA and mild radiological progression.  Patient had disease progression on Provenge and switch to third line cabazitaxel   Interval history-patient reports some aches and pains in his joints for which she is taking as needed Tylenol.  He does not want to switch to any narcotic pain medications at this time.  Appetite and weight have otherwise remained stable.  ECOG PS- 1 Pain scale- 3 Opioid associated constipation- no  Review of systems- Review of Systems  Constitutional: Positive for malaise/fatigue. Negative for chills, fever and weight loss.  HENT: Negative for congestion, ear discharge and nosebleeds.   Eyes: Negative for blurred vision.  Respiratory: Negative for cough, hemoptysis, sputum production, shortness of breath and wheezing.   Cardiovascular: Negative for chest pain, palpitations, orthopnea and claudication.  Gastrointestinal: Negative for abdominal pain, blood in stool, constipation, diarrhea, heartburn, melena, nausea and vomiting.  Genitourinary: Negative for dysuria, flank pain, frequency, hematuria and urgency.  Musculoskeletal: Negative for back pain, joint pain and myalgias.  Skin: Negative for rash.  Neurological: Positive for sensory change (peripheral neuropathy). Negative for  dizziness, tingling, focal weakness, seizures, weakness and headaches.  Endo/Heme/Allergies: Does not bruise/bleed  easily.  Psychiatric/Behavioral: Negative for depression and suicidal ideas. The patient does not have insomnia.       Allergies  Allergen Reactions  . Latex Rash    Sensitivity, not allergy.     Past Medical History:  Diagnosis Date  . Arthritis, degenerative 10/30/2015  . Atrial fibrillation ([place]) 10/30/2015  . Bladder spasm 06/12/2013  . Carcinoma of prostate ([place]) 10/30/2015  . Cardiomyopathy ([place]) 09/10/2014   Overview:  EF 35%,4/15   . Cerebrovascular accident (CVA) ([place]) 10/30/2015   Overview:  Left embolic SNK,5397   . Difficult or painful urination 11/21/2013  . Dysrhythmia   . Gout 10/30/2015  . History of kidney stones   . Malignant neoplasm of prostate ([place]) 09/05/2012  . Sinoatrial node dysfunction ([facility]) 10/30/2015   Overview:  DDD pacemaker, 1996      Past Surgical History:  Procedure Laterality Date  . castration  10/2009  . CATARACT EXTRACTION W/ INTRAOCULAR LENS  IMPLANT, BILATERAL Bilateral 2016   right eye done then the left one done 3 months later in 2016  . COLONOSCOPY WITH PROPOFOL N/A 11/21/2019   Procedure: COLONOSCOPY WITH PROPOFOL;  Surgeon: Robert Bellow, MD;  Location: [facility] ENDOSCOPY;  Service: Endoscopy;  Laterality: N/A;  . INGUINAL HERNIA REPAIR Left   . INSERT / REPLACE / REMOVE PACEMAKER    . IR FLUORO GUIDE PORT INSERTION RIGHT  07/01/2017  . IR REMOVAL TUN ACCESS W/ PORT W/O FL MOD SED  06/20/2020  . IR REMOVAL TUN CV CATH W/O FL  08/01/2020  . JOINT REPLACEMENT Bilateral 1980   hips  . left knee replacement  2014  . PACEMAKER INSERTION    . PORTA CATH INSERTION N/A 09/11/2020   Procedure: PORTA CATH INSERTION;  Surgeon: Algernon Huxley, MD;  Location: [facility];  Service: Cardiovascular;  Laterality: N/A;  . PROSTATECTOMY    . REPLACEMENT TOTAL HIP W/  RESURFACING IMPLANTS  1997   bilateral-   . REPLACEMENT TOTAL KNEE Bilateral   . testes removal     per pt   . TONSILLECTOMY AND ADENOIDECTOMY    . TRANSURETHRAL RESECTION  OF BLADDER TUMOR N/A 12/31/2015   Procedure: BLADDER BIOPSY;  Surgeon: Nickie Retort, MD;  Location: [facility] ORS;  Service: Urology;  Laterality: N/A;  . Urinary incontinence valve     AMS    Social History   Socioeconomic History  . Marital status: Married    Spouse name: Not on file  . Number of children: Not on file  . Years of education: Not on file  . Highest education level: Not on file  Occupational History  . Not on file  Tobacco Use  . Smoking status: Never Smoker  . Smokeless tobacco: Never Used  Vaping Use  . Vaping Use: Never used  Substance and Sexual Activity  . Alcohol use: No    Alcohol/week: 0.0 standard drinks  . Drug use: No  . Sexual activity: Never  Other Topics Concern  . Not on file  Social History Narrative   Lives at home with spouse   Social Determinants of Health   Financial Resource Strain:   . Difficulty of Paying Living Expenses: Not on file  Food Insecurity:   . Worried About Charity fundraiser in the Last Year: Not on file  . Ran Out of Food in the Last Year: Not on file  Transportation Needs:   .  Lack of Transportation (Medical): Not on file  . Lack of Transportation (Non-Medical): Not on file  Physical Activity:   . Days of Exercise per Week: Not on file  . Minutes of Exercise per Session: Not on file  Stress:   . Feeling of Stress : Not on file  Social Connections:   . Frequency of Communication with Friends and Family: Not on file  . Frequency of Social Gatherings with Friends and Family: Not on file  . Attends Religious Services: Not on file  . Active Member of Clubs or Organizations: Not on file  . Attends Archivist Meetings: Not on file  . Marital Status: Not on file  Intimate Partner Violence:   . Fear of Current or Ex-Partner: Not on file  . Emotionally Abused: Not on file  . Physically Abused: Not on file  . Sexually Abused: Not on file    Family History  Problem Relation Age of Onset  . Cancer Mother    . Arthritis Mother   . Heart attack Father   . Arthritis Father   . Transient ischemic attack Brother   . Prostate cancer Neg Hx   . Bladder Cancer Neg Hx      Current Outpatient Medications:  .  acetaminophen (TYLENOL) 325 MG tablet, Take 650 mg by mouth 2 (two) times daily., Disp: , Rfl:  .  allopurinol (ZYLOPRIM) 300 MG tablet, Take by mouth daily. In am., Disp: , Rfl:  .  bisacodyl (DULCOLAX) 5 MG EC tablet, Take 5 mg by mouth 2 (two) times a day., Disp: , Rfl:  .  calcium carbonate (CALCIUM 600) 600 MG TABS tablet, Take 600 mg by mouth 2 (two) times daily with a meal., Disp: , Rfl:  .  cyanocobalamin (,VITAMIN B-12,) 1000 MCG/ML injection, Inject 1,000 mcg into the muscle every 30 (thirty) days. , Disp: , Rfl:  .  diltiazem (CARDIZEM CD) 180 MG 24 hr capsule, Take 180 mg by mouth daily., Disp: , Rfl:  .  furosemide (LASIX) 20 MG tablet, Take 20 mg by mouth daily., Disp: , Rfl:  .  lidocaine-prilocaine (EMLA) cream, , Disp: , Rfl:  .  lidocaine-prilocaine (EMLA) cream, Apply to affected area once, Disp: 30 g, Rfl: 3 .  ondansetron (ZOFRAN) 8 MG tablet, Take 1 tablet (8 mg total) by mouth 2 (two) times daily as needed (Nausea or vomiting)., Disp: 30 tablet, Rfl: 1 .  phentermine 15 MG capsule, Take 15 mg by mouth every morning., Disp: , Rfl:  .  potassium chloride (KLOR-CON) 10 MEQ tablet, TAKE 2 TABLETS BY MOUTH ONCE DAILY, Disp: 60 tablet, Rfl: 2 .  prochlorperazine (COMPAZINE) 10 MG tablet, Take 1 tablet (10 mg total) by mouth every 6 (six) hours as needed (Nausea or vomiting)., Disp: 30 tablet, Rfl: 1 .  simvastatin (ZOCOR) 40 MG tablet, Take 40 mg by mouth daily at 6 PM. , Disp: , Rfl:  .  warfarin (COUMADIN) 3 MG tablet, Take 3 mg by mouth daily. Takes on Tuesday and thursday, Disp: , Rfl:  .  warfarin (COUMADIN) 6 MG tablet, Take 1 tablet by mouth. Monday, wed, Friday, Saturday and Sunday, Disp: , Rfl:  .  Abiraterone Acetate (ZYTIGA PO), Take 4 capsules by mouth daily.   (Patient not taking: Reported on 09/11/2020), Disp: , Rfl:  .  predniSONE (DELTASONE) 10 MG tablet, Take 1 tablet (10 mg total) by mouth daily. (Patient not taking: Reported on 09/11/2020), Disp: 21 tablet, Rfl: 3 .  predniSONE (DELTASONE)  5 MG tablet, TAKE 1 TABLET (5 MG TOTAL) BY MOUTH 2 TIMES DAILY WITH A MEAL. (Patient not taking: Reported on 09/11/2020), Disp: 60 tablet, Rfl: 5 No current facility-administered medications for this visit.  Facility-Administered Medications Ordered in Other Visits:  .  denosumab (XGEVA) injection 120 mg, 120 mg, Subcutaneous, Q28 days, Sindy Guadeloupe, MD, 120 mg at 11/15/19 1158  Physical exam:  Vitals:   09/16/20 0919  BP: 132/70  Pulse: 64  Resp: 16  Temp: (!) 96.8 F (36 C)  TempSrc: Tympanic  SpO2: 99%  Weight: 287 lb 14.4 oz (130.6 kg)   Physical Exam Constitutional:      General: He is not in acute distress. Cardiovascular:     Rate and Rhythm: Normal rate. Rhythm irregular.     Heart sounds: Normal heart sounds.  Pulmonary:     Effort: Pulmonary effort is normal.     Breath sounds: Normal breath sounds.  Abdominal:     General: Bowel sounds are normal.     Palpations: Abdomen is soft.  Skin:    General: Skin is warm and dry.  Neurological:     Mental Status: He is alert and oriented to person, place, and time.      CMP Latest Ref Rng & Units 09/16/2020  Glucose 70 - 99 mg/dL 104(H)  BUN 8 - 23 mg/dL 24(H)  Creatinine 0.61 - 1.24 mg/dL 0.94  Sodium 135 - 145 mmol/L 135  Potassium 3.5 - 5.1 mmol/L 3.9  Chloride 98 - 111 mmol/L 103  CO2 22 - 32 mmol/L 23  Calcium 8.9 - 10.3 mg/dL 9.0  Total Protein 6.5 - 8.1 g/dL 6.7  Total Bilirubin 0.3 - 1.2 mg/dL 1.1  Alkaline Phos 38 - 126 U/L 63  AST 15 - 41 U/L 20  ALT 0 - 44 U/L 9   CBC Latest Ref Rng & Units 09/16/2020  WBC 4.0 - 10.5 K/uL 4.5  Hemoglobin 13.0 - 17.0 g/dL 10.9(L)  Hematocrit 39 - 52 % 31.7(L)  Platelets 150 - 400 K/uL 118(L)    No images are attached to the  encounter.  NM Bone Scan Whole Body  Result Date: 09/05/2020 CLINICAL DATA:  Prostate cancer, restaging examination EXAM: NUCLEAR MEDICINE WHOLE BODY BONE SCAN TECHNIQUE: Whole body anterior and posterior images were obtained approximately 3 hours after intravenous injection of radiopharmaceutical. RADIOPHARMACEUTICALS:  23.044 mCi Technetium-2m MDP IV COMPARISON:  10/23/2019. Findings are also correlated with CT examinations of 09/04/2020 and 03/19/2020 FINDINGS: There is increasing uptake of radiotracer involving the left frontal bone and inferior aspect of the left scapula. Additionally, there is new uptake of radiotracer involving the right fifth rib anteriorly, medial right acetabulum, and T3, T5, and T7 vertebral bodies. Uptake within the a posterior left right third rib, however, appears to have resolved in the interval. Uptake involving the maxilla anteriorly appears unchanged, possibly related to periodontal disease. Extensive uptake within the shoulders bilaterally and, to a lesser extent, within the sternoclavicular joints bilaterally is likely degenerative in nature. Uptake within the feet bilaterally is likely degenerative in nature. Uptake within the left superior acetabulum appears to correspond to an area of increasing osteolysis anterosuperior to the acetabular component of the left total hip arthroplasty likely representing an aggressive granulocytic reaction. Normal soft tissue distribution. Normal uptake and excretion within the kidneys and bladder. IMPRESSION: Mixed response to therapy with development of multiple new metastases, progression of disease involving existing metastases within the left frontal bone and left scapula, but resolution of  a single metastasis within the posterior right third rib. Focal uptake of radiotracer involving an area of osteolysis surrounding the left hip prosthesis acetabular component. A a superimposed pathologic fracture is not excluded and dedicated CT  examination of the left hip may be helpful for further evaluation. Electronically Signed   By: Fidela Salisbury MD   On: 09/05/2020 01:46   CT CHEST ABDOMEN PELVIS W CONTRAST  Result Date: 09/04/2020 CLINICAL DATA:  74 year old male with history of castrate resistant metastatic prostate cancer, currently on Zytiga and prednisone. EXAM: CT CHEST, ABDOMEN, AND PELVIS WITH CONTRAST TECHNIQUE: Multidetector CT imaging of the chest, abdomen and pelvis was performed following the standard protocol during bolus administration of intravenous contrast. CONTRAST:  194m OMNIPAQUE IOHEXOL 350 MG/ML SOLN COMPARISON:  CT the chest, abdomen and pelvis 03/19/2020. FINDINGS: CT CHEST FINDINGS Cardiovascular: Heart size is mildly enlarged with left atrial dilatation. There is no significant pericardial fluid, thickening or pericardial calcification. There is aortic atherosclerosis, as well as atherosclerosis of the great vessels of the mediastinum and the coronary arteries, including calcified atherosclerotic plaque in the left main, left anterior descending, left circumflex and right coronary arteries. Mild calcifications of the aortic valve and mitral annulus. Left-sided pacemaker/AICD with lead tips terminating in the right atrial appendage and right ventricular apex. Mediastinum/Nodes: Multiple prominent borderline enlarged and mildly enlarged mediastinal lymph nodes, largest of which is in the subcarinal nodal station where there is a node or cluster of nodes measuring up to 3.1 cm in short axis. Esophagus is unremarkable in appearance. No axillary lymphadenopathy. Lungs/Pleura: No suspicious appearing pulmonary nodules or masses are noted. No acute consolidative airspace disease. No pleural effusions. Areas of chronic scarring and architectural distortion in the lower lobes of the lungs bilaterally, similar to the prior study. Chronic elevation of the left hemidiaphragm, similar to the prior study. Musculoskeletal: Multiple  predominantly sclerotic lesions are again noted throughout the visualized axial and appendicular skeleton, some of which have increased in size, with a good example in the anterior aspect of T4 (sagittal image 110 of series 6) measuring 1.7 cm in diameter (previously 8 mm). Chronic compression fracture of T3 and T12, most severe at T12 where there is with approximately 75% loss of anterior vertebral body height, similar to prior examination. CT ABDOMEN PELVIS FINDINGS Hepatobiliary: No suspicious cystic or solid hepatic lesions. No intra or extrahepatic biliary ductal dilatation. Gallbladder is normal in appearance. Pancreas: No pancreatic mass. No pancreatic ductal dilatation. No pancreatic or peripancreatic fluid collections or inflammatory changes. Spleen: Unremarkable. Adrenals/Urinary Tract: No suspicious renal lesions. No hydroureteronephrosis. Urinary bladder is normal in appearance. Bilateral adrenal glands are normal in appearance. Stomach/Bowel: Normal appearance of the stomach. No pathologic dilatation of small bowel or colon. Numerous colonic diverticulae are noted, particularly in the sigmoid colon, without surrounding inflammatory changes to suggest an acute diverticulitis at this time. The appendix is not confidently identified and may be surgically absent. Regardless, there are no inflammatory changes noted adjacent to the cecum to suggest the presence of an acute appendicitis at this time. Vascular/Lymphatic: Aortic atherosclerosis, without evidence of aneurysm or dissection in the abdominal or pelvic vasculature. Markedly worsened lymphadenopathy the throughout the retroperitoneum and pelvis. The largest of these retroperitoneal nodes is a bulky nodal conglomerate in the left para-aortic nodal station near the left renal hilum (axial image 74 of series 2) measuring 7.4 x 6.3 cm (previously 2.3 x 2.8 cm). The largest pelvic lymph node is in the right common iliac nodal station (axial  image 100 of  series 2) currently measuring 2.1 cm in short axis (previously 1.2 cm). Reproductive: Status post radical prostatectomy. No unexpected soft tissue mass in the low anatomic pelvis. Other: No significant volume of ascites.  No pneumoperitoneum. Musculoskeletal: Status post bilateral hip arthroplasty. There are no aggressive appearing lytic or blastic lesions noted in the visualized portions of the skeleton. IMPRESSION: 1. Today's study demonstrates progression of metastatic disease, most notable for increasing lymphadenopathy in the mediastinum, retroperitoneum and pelvis, as well as enlarging osseous lesions, as detailed above. 2. Colonic diverticulosis without evidence of acute diverticulitis at this time. 3. Aortic atherosclerosis, in addition to left main and 3 vessel coronary artery disease. Assessment for potential risk factor modification, dietary therapy or pharmacologic therapy may be warranted, if clinically indicated. 4. There are calcifications of the aortic valve and mitral annulus. Echocardiographic correlation for evaluation of potential valvular dysfunction may be warranted if clinically indicated. 5. Cardiomegaly with left atrial dilatation 6. Additional incidental findings, as above. Electronically Signed   By: Vinnie Langton M.D.   On: 09/04/2020 11:34   PERIPHERAL VASCULAR CATHETERIZATION  Result Date: 09/11/2020 See op note    Assessment and plan- Patient is a 74 y.o. 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  Patient has had disease progression on docetaxel, Zytiga and Provenge  Counts okay to proceed with cycle 1 of cabazitaxel today.  He does have some baseline anemia as well as thrombocytopenia.  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  I will therefore reduce the dose of cabazitaxel to 15 mg per metered square.   Plan is to give her IV every 3 weeks for 6 treatments with on pro Neulasta support.  Treatment will be given with a palliative intent.  Discussed risks and benefits of the [place] Tax[place] including all but not limited to nausea, vomiting, low blood counts, risk of infections and hospitalization as well as peripheral neuropathy.  Patient understands and agrees to proceed as planned.  Patient will take prednisone 5 mg twice daily along with cabazitaxel.  I will see him back in 10 days with labs for possible IV fluids and again in 3 weeks prior to next cycle of cabazitaxel.  Patient will also receive Xgeva with next dose of cabazitaxel   Visit Diagnosis 1. Malignant neoplasm of prostate ([place])   2. Encounter for antineoplastic chemotherapy   3. Bone metastases ([place])      Dr. Randa Evens, MD, MPH [facility] [phone] 09/16/2020 3:07 PM

## 2020-09-22 ENCOUNTER — Ambulatory Visit: Payer: PPO | Attending: Internal Medicine

## 2020-09-22 ENCOUNTER — Other Ambulatory Visit: Payer: Self-pay | Admitting: *Deleted

## 2020-09-22 ENCOUNTER — Other Ambulatory Visit: Payer: Self-pay | Admitting: Oncology

## 2020-09-22 ENCOUNTER — Other Ambulatory Visit: Payer: Self-pay

## 2020-09-22 DIAGNOSIS — B349 Viral infection, unspecified: Secondary | ICD-10-CM | POA: Diagnosis not present

## 2020-09-22 DIAGNOSIS — D696 Thrombocytopenia, unspecified: Secondary | ICD-10-CM | POA: Diagnosis not present

## 2020-09-22 DIAGNOSIS — I482 Chronic atrial fibrillation, unspecified: Secondary | ICD-10-CM | POA: Diagnosis not present

## 2020-09-22 DIAGNOSIS — I480 Paroxysmal atrial fibrillation: Secondary | ICD-10-CM | POA: Diagnosis not present

## 2020-09-22 DIAGNOSIS — M1A00X0 Idiopathic chronic gout, unspecified site, without tophus (tophi): Secondary | ICD-10-CM | POA: Diagnosis not present

## 2020-09-22 DIAGNOSIS — R300 Dysuria: Secondary | ICD-10-CM

## 2020-09-22 DIAGNOSIS — E782 Mixed hyperlipidemia: Secondary | ICD-10-CM | POA: Diagnosis not present

## 2020-09-22 DIAGNOSIS — Z5111 Encounter for antineoplastic chemotherapy: Secondary | ICD-10-CM | POA: Diagnosis not present

## 2020-09-22 DIAGNOSIS — Z23 Encounter for immunization: Secondary | ICD-10-CM

## 2020-09-22 DIAGNOSIS — E538 Deficiency of other specified B group vitamins: Secondary | ICD-10-CM | POA: Diagnosis not present

## 2020-09-22 DIAGNOSIS — R739 Hyperglycemia, unspecified: Secondary | ICD-10-CM | POA: Diagnosis not present

## 2020-09-22 LAB — URINALYSIS, COMPLETE (UACMP) WITH MICROSCOPIC
Bacteria, UA: NONE SEEN
Bilirubin Urine: NEGATIVE
Glucose, UA: NEGATIVE mg/dL
Ketones, ur: NEGATIVE mg/dL
Leukocytes,Ua: NEGATIVE
Nitrite: NEGATIVE
Protein, ur: NEGATIVE mg/dL
Specific Gravity, Urine: 1.017 (ref 1.005–1.030)
pH: 5 (ref 5.0–8.0)

## 2020-09-22 MED ORDER — PHENAZOPYRIDINE HCL 100 MG PO TABS: 100.0000 mg | ORAL_TABLET | Freq: Three times a day (TID) | ORAL | 0 refills | Status: DC | PRN

## 2020-09-22 NOTE — Progress Notes (Signed)
Pt came in and said that he has pain on urination and he has had uti in the past and it is the same feeling he has had many times before. Dr Janese Banks ordered ua and cult. It has been sent and we will look for results

## 2020-09-22 NOTE — Progress Notes (Signed)
   Covid-19 Vaccination Clinic  Name:  Jeremy Carney    MRN: [id number] DOB: 05-02-1946  09/22/2020  [name] was observed post Covid-19 immunization for 30 minutes based on pre-vaccination screening without incident. He was provided with Vaccine Information Sheet and instruction to access the V-Safe system.   [name] was instructed to call 911 with any severe reactions post vaccine: Marland Kitchen Difficulty breathing  . Swelling of face and throat  . A fast heartbeat  . A bad rash all over body  . Dizziness and weakness   Immunizations Administered    Name Date Dose VIS Date Route   Moderna COVID-19 Vaccine 09/22/2020 11:14 AM 0.5 mL 11/2019 Intramuscular   Manufacturer: Moderna   Lot: [id number]   [place]: [id number]

## 2020-09-22 NOTE — Progress Notes (Signed)
Pt came by today and was having stinging sensation with urination and sometimes just sitting. He has had this multiple times in his life. Thought the md would want a ua and cult. He gave sample and the UA- did not show any bacteria so we will wait til culture comes back. In the mean time , he can have pyridium tid as needed and did advise pt of the orange color urine and it will stain any clothing it comes on surface of. Pt aware and agreeable to the plan above

## 2020-09-23 LAB — URINE CULTURE: Culture: NO GROWTH

## 2020-09-24 ENCOUNTER — Telehealth: Payer: Self-pay | Admitting: *Deleted

## 2020-09-24 NOTE — Telephone Encounter (Signed)
Called pt to let him know that his urine culture did not grow any bacteria. I wanted to see if the pyridium helped. He said that he did  not try it. He had amoxicillin at home and took it 2 days and dr Sabra Heck did a urine and it had bacteria in ua and sent him atb in. He was already feeling better and just picked up the atb from dr Sabra Heck and started that day I spoke to him

## 2020-09-26 ENCOUNTER — Other Ambulatory Visit: Payer: Self-pay

## 2020-09-26 ENCOUNTER — Inpatient Hospital Stay: Payer: PPO

## 2020-09-26 VITALS — BP 116/67 | HR 84 | Temp 97.5°F | Resp 18

## 2020-09-26 DIAGNOSIS — Z5111 Encounter for antineoplastic chemotherapy: Secondary | ICD-10-CM | POA: Diagnosis not present

## 2020-09-26 DIAGNOSIS — Z95828 Presence of other vascular implants and grafts: Secondary | ICD-10-CM

## 2020-09-26 DIAGNOSIS — C61 Malignant neoplasm of prostate: Secondary | ICD-10-CM

## 2020-09-26 LAB — CBC WITH DIFFERENTIAL/PLATELET
Abs Immature Granulocytes: 0.18 10*3/uL — ABNORMAL HIGH (ref 0.00–0.07)
Basophils Absolute: 0.1 10*3/uL (ref 0.0–0.1)
Basophils Relative: 1 %
Eosinophils Absolute: 0.1 10*3/uL (ref 0.0–0.5)
Eosinophils Relative: 1 %
HCT: 30.7 % — ABNORMAL LOW (ref 39.0–52.0)
Hemoglobin: 10.4 g/dL — ABNORMAL LOW (ref 13.0–17.0)
Immature Granulocytes: 2 %
Lymphocytes Relative: 9 %
Lymphs Abs: 1 10*3/uL (ref 0.7–4.0)
MCH: 31 pg (ref 26.0–34.0)
MCHC: 33.9 g/dL (ref 30.0–36.0)
MCV: 91.6 fL (ref 80.0–100.0)
Monocytes Absolute: 0.5 10*3/uL (ref 0.1–1.0)
Monocytes Relative: 5 %
Neutro Abs: 9.7 10*3/uL — ABNORMAL HIGH (ref 1.7–7.7)
Neutrophils Relative %: 82 %
Platelets: 127 10*3/uL — ABNORMAL LOW (ref 150–400)
RBC: 3.35 MIL/uL — ABNORMAL LOW (ref 4.22–5.81)
RDW: 15.3 % (ref 11.5–15.5)
WBC: 11.6 10*3/uL — ABNORMAL HIGH (ref 4.0–10.5)
nRBC: 0 % (ref 0.0–0.2)

## 2020-09-26 LAB — COMPREHENSIVE METABOLIC PANEL
ALT: 9 U/L (ref 0–44)
AST: 19 U/L (ref 15–41)
Albumin: 3.8 g/dL (ref 3.5–5.0)
Alkaline Phosphatase: 78 U/L (ref 38–126)
Anion gap: 9 (ref 5–15)
BUN: 25 mg/dL — ABNORMAL HIGH (ref 8–23)
CO2: 23 mmol/L (ref 22–32)
Calcium: 9.2 mg/dL (ref 8.9–10.3)
Chloride: 105 mmol/L (ref 98–111)
Creatinine, Ser: 1.17 mg/dL (ref 0.61–1.24)
GFR, Estimated: >60 mL/min (ref >60)
Glucose, Bld: 99 mg/dL (ref 70–99)
Potassium: 4 mmol/L (ref 3.5–5.1)
Sodium: 137 mmol/L (ref 135–145)
Total Bilirubin: 1.2 mg/dL (ref 0.3–1.2)
Total Protein: 6.8 g/dL (ref 6.5–8.1)

## 2020-09-26 MED ORDER — HEPARIN SOD (PORK) LOCK FLUSH 100 UNIT/ML IV SOLN
500.0000 [IU] | Freq: Once | INTRAVENOUS | Status: AC
  Administered 2020-09-26: 500 [IU] via INTRAVENOUS
  Filled 2020-09-26: qty 5

## 2020-09-26 MED ORDER — SODIUM CHLORIDE 0.9% FLUSH
10.0000 mL | Freq: Once | INTRAVENOUS | Status: AC
  Administered 2020-09-26: 10 mL via INTRAVENOUS
  Filled 2020-09-26: qty 10

## 2020-09-26 NOTE — Progress Notes (Signed)
Pt reports eating and drinking okay, labs resulted, and per Dr. Janese Banks no need for IVFs at this time. Pt aware and agrees with plan. Pt aware to call clinic with any changes/concerns/questions, pt verbalizes understanding. Pt stable at discharge.

## 2020-09-30 DIAGNOSIS — D696 Thrombocytopenia, unspecified: Secondary | ICD-10-CM | POA: Diagnosis not present

## 2020-09-30 DIAGNOSIS — E782 Mixed hyperlipidemia: Secondary | ICD-10-CM | POA: Diagnosis not present

## 2020-09-30 DIAGNOSIS — D500 Iron deficiency anemia secondary to blood loss (chronic): Secondary | ICD-10-CM | POA: Diagnosis not present

## 2020-09-30 DIAGNOSIS — M1A00X0 Idiopathic chronic gout, unspecified site, without tophus (tophi): Secondary | ICD-10-CM | POA: Diagnosis not present

## 2020-09-30 DIAGNOSIS — E538 Deficiency of other specified B group vitamins: Secondary | ICD-10-CM | POA: Diagnosis not present

## 2020-09-30 DIAGNOSIS — Z0000 Encounter for general adult medical examination without abnormal findings: Secondary | ICD-10-CM | POA: Diagnosis not present

## 2020-09-30 DIAGNOSIS — I482 Chronic atrial fibrillation, unspecified: Secondary | ICD-10-CM | POA: Diagnosis not present

## 2020-09-30 DIAGNOSIS — I700 Atherosclerosis of aorta: Secondary | ICD-10-CM | POA: Insufficient documentation

## 2020-10-07 ENCOUNTER — Other Ambulatory Visit: Payer: Self-pay

## 2020-10-07 ENCOUNTER — Inpatient Hospital Stay: Payer: PPO

## 2020-10-07 ENCOUNTER — Inpatient Hospital Stay (HOSPITAL_BASED_OUTPATIENT_CLINIC_OR_DEPARTMENT_OTHER): Payer: PPO | Admitting: Oncology

## 2020-10-07 ENCOUNTER — Encounter: Payer: Self-pay | Admitting: Oncology

## 2020-10-07 VITALS — BP 102/70 | HR 73 | Temp 97.1°F | Resp 16 | Wt 293.1 lb

## 2020-10-07 DIAGNOSIS — C7951 Secondary malignant neoplasm of bone: Secondary | ICD-10-CM

## 2020-10-07 DIAGNOSIS — Z7983 Long term (current) use of bisphosphonates: Secondary | ICD-10-CM | POA: Diagnosis not present

## 2020-10-07 DIAGNOSIS — Z5111 Encounter for antineoplastic chemotherapy: Secondary | ICD-10-CM | POA: Diagnosis not present

## 2020-10-07 DIAGNOSIS — C61 Malignant neoplasm of prostate: Secondary | ICD-10-CM

## 2020-10-07 DIAGNOSIS — E860 Dehydration: Secondary | ICD-10-CM

## 2020-10-07 LAB — PSA: Prostatic Specific Antigen: 41.61 ng/mL — ABNORMAL HIGH (ref 0.00–4.00)

## 2020-10-07 LAB — COMPREHENSIVE METABOLIC PANEL
ALT: 10 U/L (ref 0–44)
AST: 19 U/L (ref 15–41)
Albumin: 3.8 g/dL (ref 3.5–5.0)
Alkaline Phosphatase: 67 U/L (ref 38–126)
Anion gap: 7 (ref 5–15)
BUN: 23 mg/dL (ref 8–23)
CO2: 24 mmol/L (ref 22–32)
Calcium: 8.5 mg/dL — ABNORMAL LOW (ref 8.9–10.3)
Chloride: 105 mmol/L (ref 98–111)
Creatinine, Ser: 1.09 mg/dL (ref 0.61–1.24)
GFR, Estimated: >60 mL/min (ref >60)
Glucose, Bld: 105 mg/dL — ABNORMAL HIGH (ref 70–99)
Potassium: 3.7 mmol/L (ref 3.5–5.1)
Sodium: 136 mmol/L (ref 135–145)
Total Bilirubin: 0.9 mg/dL (ref 0.3–1.2)
Total Protein: 6.6 g/dL (ref 6.5–8.1)

## 2020-10-07 LAB — CBC WITH DIFFERENTIAL/PLATELET
Abs Immature Granulocytes: 0.03 10*3/uL (ref 0.00–0.07)
Basophils Absolute: 0 10*3/uL (ref 0.0–0.1)
Basophils Relative: 1 %
Eosinophils Absolute: 0.2 10*3/uL (ref 0.0–0.5)
Eosinophils Relative: 3 %
HCT: 29.7 % — ABNORMAL LOW (ref 39.0–52.0)
Hemoglobin: 10.2 g/dL — ABNORMAL LOW (ref 13.0–17.0)
Immature Granulocytes: 1 %
Lymphocytes Relative: 16 %
Lymphs Abs: 1 10*3/uL (ref 0.7–4.0)
MCH: 31.5 pg (ref 26.0–34.0)
MCHC: 34.3 g/dL (ref 30.0–36.0)
MCV: 91.7 fL (ref 80.0–100.0)
Monocytes Absolute: 0.5 10*3/uL (ref 0.1–1.0)
Monocytes Relative: 8 %
Neutro Abs: 4.4 10*3/uL (ref 1.7–7.7)
Neutrophils Relative %: 71 %
Platelets: 168 10*3/uL (ref 150–400)
RBC: 3.24 MIL/uL — ABNORMAL LOW (ref 4.22–5.81)
RDW: 15.9 % — ABNORMAL HIGH (ref 11.5–15.5)
WBC: 6.1 10*3/uL (ref 4.0–10.5)
nRBC: 0 % (ref 0.0–0.2)

## 2020-10-07 MED ORDER — HEPARIN SOD (PORK) LOCK FLUSH 100 UNIT/ML IV SOLN
500.0000 [IU] | Freq: Once | INTRAVENOUS | Status: AC | PRN
  Administered 2020-10-07: 500 [IU]
  Filled 2020-10-07: qty 5

## 2020-10-07 MED ORDER — SODIUM CHLORIDE 0.9 % IV SOLN
10.0000 mg | Freq: Once | INTRAVENOUS | Status: AC
  Administered 2020-10-07: 10 mg via INTRAVENOUS
  Filled 2020-10-07: qty 10

## 2020-10-07 MED ORDER — DIPHENHYDRAMINE HCL 50 MG/ML IJ SOLN
25.0000 mg | Freq: Once | INTRAMUSCULAR | Status: AC
  Administered 2020-10-07: 25 mg via INTRAVENOUS
  Filled 2020-10-07: qty 1

## 2020-10-07 MED ORDER — FAMOTIDINE IN NACL 20-0.9 MG/50ML-% IV SOLN
20.0000 mg | Freq: Once | INTRAVENOUS | Status: AC
  Administered 2020-10-07: 20 mg via INTRAVENOUS
  Filled 2020-10-07: qty 50

## 2020-10-07 MED ORDER — HEPARIN SOD (PORK) LOCK FLUSH 100 UNIT/ML IV SOLN
500.0000 [IU] | Freq: Once | INTRAVENOUS | Status: DC
  Filled 2020-10-07: qty 5

## 2020-10-07 MED ORDER — PEGFILGRASTIM 6 MG/0.6ML ~~LOC~~ PSKT
6.0000 mg | PREFILLED_SYRINGE | Freq: Once | SUBCUTANEOUS | Status: AC
  Administered 2020-10-07: 6 mg via SUBCUTANEOUS
  Filled 2020-10-07: qty 0.6

## 2020-10-07 MED ORDER — DENOSUMAB 120 MG/1.7ML ~~LOC~~ SOLN: 120.0000 mg | Freq: Once | SUBCUTANEOUS | Status: DC

## 2020-10-07 MED ORDER — SODIUM CHLORIDE 0.9 % IV SOLN
15.0000 mg/m2 | Freq: Once | INTRAVENOUS | Status: AC
  Administered 2020-10-07: 39 mg via INTRAVENOUS
  Filled 2020-10-07: qty 3.9

## 2020-10-07 MED ORDER — SODIUM CHLORIDE 0.9 % IV SOLN
Freq: Once | INTRAVENOUS | Status: AC
  Filled 2020-10-07: qty 250

## 2020-10-07 MED ORDER — SODIUM CHLORIDE 0.9% FLUSH
10.0000 mL | INTRAVENOUS | Status: DC | PRN
  Administered 2020-10-07: 10 mL via INTRAVENOUS
  Filled 2020-10-07: qty 10

## 2020-10-07 NOTE — Progress Notes (Addendum)
Hold x-geva today  

## 2020-10-10 NOTE — Progress Notes (Signed)
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 . He again began to have a rising PSA in 2010 and underwent orchiectomy in 2010.He had biochemical recurrence in 2017 and was treated with IMRT  He underwent CT abdomen and pelvis in May 2018which showed abnormal periaortic and right common iliac lymph nodes. Index aortocaval lymph node 1.4 cm which was new as compared to February 2014 and suspicious for malignancy. Bone scan did not reveal any evidence of bony metastatic disease.PSA went up from 3.9 in January 20 18-7.8 in April 2018. 13.3 in July 2018 when docetaxel was initiated. Doubling time at that time was about 3 months  Docetaxel chemo initiated for CRPC with ln mets and rapid doubling PSA in august 2018.Patient completed 5 cycles of docetaxel on 09/27/2017. Cycle #6 was not given due to progressive fatigue and cytopenias as well as worsening neuropathy. Cycle 5 of docetaxel was dose reduced to 60 mg/m square.Patient never had normalization of his PSA following docetaxel and the lowest PSA after docetaxel was 5.64 in December 2018  In April 2019 patient noted to have rising PSAto 19.02and increase in the size of retroperitoneal LN. Patient therefore started second line zytiga.  Patient has remained on Zytiga since April 2019. The lowest PSA value on Zytiga was 6.1 in August 2020 and since then there  has been a small but steady increase in his PSA.Most recent imaging in November 2020 showed increase in the size of retroperitoneal lymph nodes from 0.7 cm to 1.1 cm. New periaortic lymph node 1.2 cm. Slight progression of sclerotic bone metastases involving the left scapula as well as T4 and T7. Most recent PSA on 03/07/2020 was 23.9.Given the subtle progression Zytiga was not changed at that time.  repat LN biopsy showed adenocarcinoma prostate. NGSPD-L1 1%. PTEN C388C (R130X)  Patient went to [facility] for second opinion and was recommended Provenge given the rise in PSA and mild radiological progression.  Patient had disease progression on Provenge and switch to third line cabazitaxel  Interval history-reports having tolerated first cycle of cabazitaxel well without any significant side effects.  He did not have any nausea or vomiting.  He has some baseline peripheral neuropathy in his extremities which is essentially stable.  ECOG PS- 1 Pain scale- 0 Opioid associated constipation- no  Review of systems- Review of Systems  Constitutional: Positive for malaise/fatigue. Negative for chills, fever and weight loss.  HENT: Negative for congestion, ear discharge and nosebleeds.   Eyes: Negative for blurred vision.  Respiratory: Negative for cough, hemoptysis, sputum production, shortness of breath and wheezing.   Cardiovascular: Negative for chest pain, palpitations, orthopnea and claudication.  Gastrointestinal: Negative for abdominal pain, blood in stool, constipation, diarrhea, heartburn, melena, nausea and vomiting.  Genitourinary: Negative for dysuria, flank pain, frequency, hematuria and urgency.  Musculoskeletal: Negative for back pain, joint pain and myalgias.  Skin: Negative for rash.  Neurological: Positive for sensory change (Peripheral neuropathy). Negative for dizziness, tingling, focal weakness, seizures, weakness  and headaches.  Endo/Heme/Allergies: Does not bruise/bleed  easily.  Psychiatric/Behavioral: Negative for depression and suicidal ideas. The patient does not have insomnia.       Allergies  Allergen Reactions  . Latex Rash    Sensitivity, not allergy.     Past Medical History:  Diagnosis Date  . Arthritis, degenerative 10/30/2015  . Atrial fibrillation ([place]) 10/30/2015  . Bladder spasm 06/12/2013  . Carcinoma of prostate ([place]) 10/30/2015  . Cardiomyopathy ([place]) 09/10/2014   Overview:  EF 35%,4/15   . Cerebrovascular accident (CVA) ([place]) 10/30/2015   Overview:  Left embolic HGD,9242   . Difficult or painful urination 11/21/2013  . Dysrhythmia   . Gout 10/30/2015  . History of kidney stones   . Malignant neoplasm of prostate ([place]) 09/05/2012  . Sinoatrial node dysfunction ([facility]) 10/30/2015   Overview:  DDD pacemaker, 1996      Past Surgical History:  Procedure Laterality Date  . castration  10/2009  . CATARACT EXTRACTION W/ INTRAOCULAR LENS  IMPLANT, BILATERAL Bilateral 2016   right eye done then the left one done 3 months later in 2016  . COLONOSCOPY WITH PROPOFOL N/A 11/21/2019   Procedure: COLONOSCOPY WITH PROPOFOL;  Surgeon: Robert Bellow, MD;  Location: [facility] ENDOSCOPY;  Service: Endoscopy;  Laterality: N/A;  . INGUINAL HERNIA REPAIR Left   . INSERT / REPLACE / REMOVE PACEMAKER    . IR FLUORO GUIDE PORT INSERTION RIGHT  07/01/2017  . IR REMOVAL TUN ACCESS W/ PORT W/O FL MOD SED  06/20/2020  . IR REMOVAL TUN CV CATH W/O FL  08/01/2020  . JOINT REPLACEMENT Bilateral 1980   hips  . left knee replacement  2014  . PACEMAKER INSERTION    . PORTA CATH INSERTION N/A 09/11/2020   Procedure: PORTA CATH INSERTION;  Surgeon: Algernon Huxley, MD;  Location: [place] CV LAB;  Service: Cardiovascular;  Laterality: N/A;  . PROSTATECTOMY    . REPLACEMENT TOTAL HIP W/  RESURFACING IMPLANTS  1997   bilateral-   . REPLACEMENT TOTAL KNEE Bilateral   . testes removal     per pt   . TONSILLECTOMY AND ADENOIDECTOMY    . TRANSURETHRAL RESECTION  OF BLADDER TUMOR N/A 12/31/2015   Procedure: BLADDER BIOPSY;  Surgeon: Nickie Retort, MD;  Location: [facility] ORS;  Service: Urology;  Laterality: N/A;  . Urinary incontinence valve     AMS    Social History   Socioeconomic History  . Marital status: Married    Spouse name: Not on file  . Number of children: Not on file  . Years of education: Not on file  . Highest education level: Not on file  Occupational History  . Not on file  Tobacco Use  . Smoking status: Never Smoker  . Smokeless tobacco: Never Used  Vaping Use  . Vaping Use: Never used  Substance and Sexual Activity  . Alcohol use: No    Alcohol/week: 0.0 standard drinks  . Drug use: No  . Sexual activity: Never  Other Topics Concern  . Not on file  Social History Narrative   Lives at home with spouse   Social Determinants of Health   Financial Resource Strain:   . Difficulty of Paying Living Expenses: Not on file  Food Insecurity:   . Worried About Charity fundraiser in the Last Year: Not on file  . Ran Out of Food in the Last Year: Not on file  Transportation Needs:   . Lack of Transportation (Medical):  Not on file  . Lack of Transportation (Non-Medical): Not on file  Physical Activity:   . Days of Exercise per Week: Not on file  . Minutes of Exercise per Session: Not on file  Stress:   . Feeling of Stress : Not on file  Social Connections:   . Frequency of Communication with Friends and Family: Not on file  . Frequency of Social Gatherings with Friends and Family: Not on file  . Attends Religious Services: Not on file  . Active Member of Clubs or Organizations: Not on file  . Attends Archivist Meetings: Not on file  . Marital Status: Not on file  Intimate Partner Violence:   . Fear of Current or Ex-Partner: Not on file  . Emotionally Abused: Not on file  . Physically Abused: Not on file  . Sexually Abused: Not on file    Family History  Problem Relation Age of Onset  . Cancer Mother    . Arthritis Mother   . Heart attack Father   . Arthritis Father   . Transient ischemic attack Brother   . Prostate cancer Neg Hx   . Bladder Cancer Neg Hx      Current Outpatient Medications:  .  acetaminophen (TYLENOL) 325 MG tablet, Take 650 mg by mouth 2 (two) times daily., Disp: , Rfl:  .  allopurinol (ZYLOPRIM) 300 MG tablet, Take by mouth daily. In am., Disp: , Rfl:  .  bisacodyl (DULCOLAX) 5 MG EC tablet, Take 5 mg by mouth 2 (two) times a day., Disp: , Rfl:  .  calcium carbonate (CALCIUM 600) 600 MG TABS tablet, Take 600 mg by mouth 2 (two) times daily with a meal., Disp: , Rfl:  .  cyanocobalamin (,VITAMIN B-12,) 1000 MCG/ML injection, Inject 1,000 mcg into the muscle every 30 (thirty) days. , Disp: , Rfl:  .  diltiazem (CARDIZEM CD) 180 MG 24 hr capsule, Take 180 mg by mouth daily., Disp: , Rfl:  .  furosemide (LASIX) 20 MG tablet, Take 20 mg by mouth daily., Disp: , Rfl:  .  lidocaine-prilocaine (EMLA) cream, , Disp: , Rfl:  .  lidocaine-prilocaine (EMLA) cream, Apply to affected area once, Disp: 30 g, Rfl: 3 .  ondansetron (ZOFRAN) 8 MG tablet, Take 1 tablet (8 mg total) by mouth 2 (two) times daily as needed (Nausea or vomiting)., Disp: 30 tablet, Rfl: 1 .  phenazopyridine (PYRIDIUM) 100 MG tablet, Take 1 tablet (100 mg total) by mouth 3 (three) times daily as needed for pain., Disp: 20 tablet, Rfl: 0 .  phentermine 15 MG capsule, Take 15 mg by mouth every morning., Disp: , Rfl:  .  potassium chloride (KLOR-CON) 10 MEQ tablet, TAKE 2 TABLETS BY MOUTH ONCE DAILY, Disp: 60 tablet, Rfl: 2 .  predniSONE (DELTASONE) 5 MG tablet, TAKE 1 TABLET (5 MG TOTAL) BY MOUTH 2 TIMES DAILY WITH A MEAL., Disp: 60 tablet, Rfl: 5 .  prochlorperazine (COMPAZINE) 10 MG tablet, Take 1 tablet (10 mg total) by mouth every 6 (six) hours as needed (Nausea or vomiting)., Disp: 30 tablet, Rfl: 1 .  simvastatin (ZOCOR) 40 MG tablet, Take 40 mg by mouth daily at 6 PM. , Disp: , Rfl:  .  warfarin  (COUMADIN) 3 MG tablet, Take 3 mg by mouth daily. Takes on Tuesday and thursday, Disp: , Rfl:  .  warfarin (COUMADIN) 6 MG tablet, Take 1 tablet by mouth. Monday, wed, Friday, Saturday and Sunday, Disp: , Rfl:  No current facility-administered medications  for this visit.  Facility-Administered Medications Ordered in Other Visits:  .  denosumab (XGEVA) injection 120 mg, 120 mg, Subcutaneous, Q28 days, Sindy Guadeloupe, MD, 120 mg at 11/15/19 1158  Physical exam:  Vitals:   10/07/20 1007  BP: 102/70  Pulse: 73  Resp: 16  Temp: (!) 97.1 F (36.2 C)  TempSrc: Tympanic  SpO2: 100%  Weight: 293 lb 1.6 oz (132.9 kg)   Physical Exam HENT:     Head: Normocephalic and atraumatic.  Eyes:     Pupils: Pupils are equal, round, and reactive to light.  Cardiovascular:     Rate and Rhythm: Normal rate and regular rhythm.     Heart sounds: Normal heart sounds.  Pulmonary:     Effort: Pulmonary effort is normal.     Breath sounds: Normal breath sounds.  Abdominal:     General: Bowel sounds are normal.     Palpations: Abdomen is soft.  Musculoskeletal:     Cervical back: Normal range of motion.  Skin:    General: Skin is warm and dry.  Neurological:     Mental Status: He is alert and oriented to person, place, and time.      CMP Latest Ref Rng & Units 10/07/2020  Glucose 70 - 99 mg/dL 105(H)  BUN 8 - 23 mg/dL 23  Creatinine 0.61 - 1.24 mg/dL 1.09  Sodium 135 - 145 mmol/L 136  Potassium 3.5 - 5.1 mmol/L 3.7  Chloride 98 - 111 mmol/L 105  CO2 22 - 32 mmol/L 24  Calcium 8.9 - 10.3 mg/dL 8.5(L)  Total Protein 6.5 - 8.1 g/dL 6.6  Total Bilirubin 0.3 - 1.2 mg/dL 0.9  Alkaline Phos 38 - 126 U/L 67  AST 15 - 41 U/L 19  ALT 0 - 44 U/L 10   CBC Latest Ref Rng & Units 10/07/2020  WBC 4.0 - 10.5 K/uL 6.1  Hemoglobin 13.0 - 17.0 g/dL 10.2(L)  Hematocrit 39 - 52 % 29.7(L)  Platelets 150 - 400 K/uL 168    No images are attached to the encounter.  PERIPHERAL VASCULAR  CATHETERIZATION  Result Date: 09/11/2020 See op note    Assessment and plan- Patient is a 74 y.o. 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  I will see him back in 3 weeks for cycle 3 Tolerating treatment well so far without any significant side effects.  PSA which had gone up to 142 in September 2021 has come down to 41.6.  He is s/p bilateral orchiectomy and therefore not receiving any Lupron or Eligard.  Bone metastases: We will hold Delton See today given that his serum calcium is 8.5 and he will receive Xgeva at next visit in 3 weeks  CBC with differential CMP and see Dr. Janese Banks in 3 weeks for cycle 3 of cabazitaxel with on pro Neulasta support   Visit Diagnosis 1. Malignant neoplasm of prostate ([place])   2. Encounter for antineoplastic chemotherapy   3. Long term (current) use of bisphosphonates   4. Bone metastases ([place])      Dr. Randa Evens, MD, MPH [facility] [phone] 10/10/2020 12:20 PM

## 2020-10-22 DIAGNOSIS — D500 Iron deficiency anemia secondary to blood loss (chronic): Secondary | ICD-10-CM | POA: Diagnosis not present

## 2020-10-22 DIAGNOSIS — I480 Paroxysmal atrial fibrillation: Secondary | ICD-10-CM | POA: Diagnosis not present

## 2020-10-28 ENCOUNTER — Ambulatory Visit: Payer: PPO | Admitting: Oncology

## 2020-10-28 ENCOUNTER — Inpatient Hospital Stay: Payer: PPO

## 2020-10-28 ENCOUNTER — Encounter: Payer: Self-pay | Admitting: Oncology

## 2020-10-28 ENCOUNTER — Other Ambulatory Visit: Payer: PPO

## 2020-10-28 ENCOUNTER — Ambulatory Visit: Payer: PPO

## 2020-10-28 ENCOUNTER — Inpatient Hospital Stay: Payer: PPO | Attending: Oncology | Admitting: Oncology

## 2020-10-28 VITALS — BP 140/76 | HR 71 | Resp 16

## 2020-10-28 VITALS — BP 126/72 | HR 63 | Temp 98.3°F | Resp 16 | Ht 72.0 in | Wt 294.8 lb

## 2020-10-28 DIAGNOSIS — Z5189 Encounter for other specified aftercare: Secondary | ICD-10-CM | POA: Insufficient documentation

## 2020-10-28 DIAGNOSIS — Z9079 Acquired absence of other genital organ(s): Secondary | ICD-10-CM | POA: Diagnosis not present

## 2020-10-28 DIAGNOSIS — E860 Dehydration: Secondary | ICD-10-CM

## 2020-10-28 DIAGNOSIS — C773 Secondary and unspecified malignant neoplasm of axilla and upper limb lymph nodes: Secondary | ICD-10-CM

## 2020-10-28 DIAGNOSIS — R5383 Other fatigue: Secondary | ICD-10-CM | POA: Insufficient documentation

## 2020-10-28 DIAGNOSIS — Z809 Family history of malignant neoplasm, unspecified: Secondary | ICD-10-CM | POA: Insufficient documentation

## 2020-10-28 DIAGNOSIS — Z7952 Long term (current) use of systemic steroids: Secondary | ICD-10-CM | POA: Diagnosis not present

## 2020-10-28 DIAGNOSIS — C61 Malignant neoplasm of prostate: Secondary | ICD-10-CM

## 2020-10-28 DIAGNOSIS — T451X5A Adverse effect of antineoplastic and immunosuppressive drugs, initial encounter: Secondary | ICD-10-CM

## 2020-10-28 DIAGNOSIS — C772 Secondary and unspecified malignant neoplasm of intra-abdominal lymph nodes: Secondary | ICD-10-CM | POA: Diagnosis not present

## 2020-10-28 DIAGNOSIS — Z5111 Encounter for antineoplastic chemotherapy: Secondary | ICD-10-CM | POA: Diagnosis not present

## 2020-10-28 DIAGNOSIS — C7951 Secondary malignant neoplasm of bone: Secondary | ICD-10-CM | POA: Insufficient documentation

## 2020-10-28 DIAGNOSIS — D6481 Anemia due to antineoplastic chemotherapy: Secondary | ICD-10-CM | POA: Diagnosis not present

## 2020-10-28 DIAGNOSIS — G620 Drug-induced polyneuropathy: Secondary | ICD-10-CM | POA: Insufficient documentation

## 2020-10-28 DIAGNOSIS — Z7983 Long term (current) use of bisphosphonates: Secondary | ICD-10-CM

## 2020-10-28 LAB — COMPREHENSIVE METABOLIC PANEL
ALT: 10 U/L (ref 0–44)
AST: 19 U/L (ref 15–41)
Albumin: 3.8 g/dL (ref 3.5–5.0)
Alkaline Phosphatase: 66 U/L (ref 38–126)
Anion gap: 10 (ref 5–15)
BUN: 29 mg/dL — ABNORMAL HIGH (ref 8–23)
CO2: 23 mmol/L (ref 22–32)
Calcium: 9.3 mg/dL (ref 8.9–10.3)
Chloride: 103 mmol/L (ref 98–111)
Creatinine, Ser: 1.11 mg/dL (ref 0.61–1.24)
GFR, Estimated: >60 mL/min (ref >60)
Glucose, Bld: 102 mg/dL — ABNORMAL HIGH (ref 70–99)
Potassium: 4 mmol/L (ref 3.5–5.1)
Sodium: 136 mmol/L (ref 135–145)
Total Bilirubin: 1.4 mg/dL — ABNORMAL HIGH (ref 0.3–1.2)
Total Protein: 6.6 g/dL (ref 6.5–8.1)

## 2020-10-28 LAB — CBC WITH DIFFERENTIAL/PLATELET
Abs Immature Granulocytes: 0.02 10*3/uL (ref 0.00–0.07)
Basophils Absolute: 0 10*3/uL (ref 0.0–0.1)
Basophils Relative: 0 %
Eosinophils Absolute: 0.2 10*3/uL (ref 0.0–0.5)
Eosinophils Relative: 3 %
HCT: 29.3 % — ABNORMAL LOW (ref 39.0–52.0)
Hemoglobin: 9.8 g/dL — ABNORMAL LOW (ref 13.0–17.0)
Immature Granulocytes: 0 %
Lymphocytes Relative: 14 %
Lymphs Abs: 0.9 10*3/uL (ref 0.7–4.0)
MCH: 30.9 pg (ref 26.0–34.0)
MCHC: 33.4 g/dL (ref 30.0–36.0)
MCV: 92.4 fL (ref 80.0–100.0)
Monocytes Absolute: 0.6 10*3/uL (ref 0.1–1.0)
Monocytes Relative: 9 %
Neutro Abs: 4.4 10*3/uL (ref 1.7–7.7)
Neutrophils Relative %: 74 %
Platelets: 143 10*3/uL — ABNORMAL LOW (ref 150–400)
RBC: 3.17 MIL/uL — ABNORMAL LOW (ref 4.22–5.81)
RDW: 17.1 % — ABNORMAL HIGH (ref 11.5–15.5)
WBC: 6 10*3/uL (ref 4.0–10.5)
nRBC: 0 % (ref 0.0–0.2)

## 2020-10-28 LAB — PSA: Prostatic Specific Antigen: 40.29 ng/mL — ABNORMAL HIGH (ref 0.00–4.00)

## 2020-10-28 MED ORDER — SODIUM CHLORIDE 0.9 % IV SOLN
10.0000 mg | Freq: Once | INTRAVENOUS | Status: AC
  Administered 2020-10-28: 10 mg via INTRAVENOUS
  Filled 2020-10-28: qty 10

## 2020-10-28 MED ORDER — SODIUM CHLORIDE 0.9% FLUSH
10.0000 mL | Freq: Once | INTRAVENOUS | Status: AC
  Administered 2020-10-28: 10 mL via INTRAVENOUS
  Filled 2020-10-28: qty 10

## 2020-10-28 MED ORDER — FAMOTIDINE IN NACL 20-0.9 MG/50ML-% IV SOLN
20.0000 mg | Freq: Once | INTRAVENOUS | Status: AC
  Administered 2020-10-28: 20 mg via INTRAVENOUS
  Filled 2020-10-28: qty 50

## 2020-10-28 MED ORDER — DENOSUMAB 120 MG/1.7ML ~~LOC~~ SOLN
120.0000 mg | Freq: Once | SUBCUTANEOUS | Status: AC
  Administered 2020-10-28: 120 mg via SUBCUTANEOUS
  Filled 2020-10-28: qty 1.7

## 2020-10-28 MED ORDER — GABAPENTIN 300 MG PO CAPS: 300.0000 mg | ORAL_CAPSULE | Freq: Three times a day (TID) | ORAL | 3 refills | Status: DC

## 2020-10-28 MED ORDER — DIPHENHYDRAMINE HCL 50 MG/ML IJ SOLN
25.0000 mg | Freq: Once | INTRAMUSCULAR | Status: AC
  Administered 2020-10-28: 25 mg via INTRAVENOUS
  Filled 2020-10-28: qty 1

## 2020-10-28 MED ORDER — HEPARIN SOD (PORK) LOCK FLUSH 100 UNIT/ML IV SOLN
INTRAVENOUS | Status: AC
  Filled 2020-10-28: qty 5

## 2020-10-28 MED ORDER — PEGFILGRASTIM 6 MG/0.6ML ~~LOC~~ PSKT
6.0000 mg | PREFILLED_SYRINGE | Freq: Once | SUBCUTANEOUS | Status: AC
  Administered 2020-10-28: 6 mg via SUBCUTANEOUS
  Filled 2020-10-28: qty 0.6

## 2020-10-28 MED ORDER — SODIUM CHLORIDE 0.9 % IV SOLN
Freq: Once | INTRAVENOUS | Status: AC
  Filled 2020-10-28: qty 250

## 2020-10-28 MED ORDER — HEPARIN SOD (PORK) LOCK FLUSH 100 UNIT/ML IV SOLN
500.0000 [IU] | Freq: Once | INTRAVENOUS | Status: AC | PRN
  Administered 2020-10-28: 500 [IU]
  Filled 2020-10-28: qty 5

## 2020-10-28 MED ORDER — SODIUM CHLORIDE 0.9 % IV SOLN
15.0000 mg/m2 | Freq: Once | INTRAVENOUS | Status: AC
  Administered 2020-10-28: 39 mg via INTRAVENOUS
  Filled 2020-10-28: qty 3.9

## 2020-10-28 NOTE — Progress Notes (Signed)
Pt feeling weaker than usual, he has shoulder and knee pain bilateral. Takes tylenol for it and was given tramadol and he takes 1/2 tab in am and 1 1/2 tab at night.-not noticed it helping the pain. Also he is on phenterimine to help with wt. Loss and he says it is not helping him to loose wt. He does experience sleeping issues due to his fingers and going through his hands with numbness and tingling. Eating good, per pt. Bowels are good also.

## 2020-10-28 NOTE — Progress Notes (Signed)
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 . He again began to have a rising PSA in 2010 and underwent orchiectomy in 2010.He had biochemical recurrence in 2017 and was treated with IMRT  He underwent CT abdomen and pelvis in May 2018which showed abnormal periaortic and right common iliac lymph nodes. Index aortocaval lymph node 1.4 cm which was new as compared to February 2014 and suspicious for malignancy. Bone scan did not reveal any evidence of bony metastatic disease.PSA went up from 3.9 in January 20 18-7.8 in April 2018. 13.3 in July 2018 when docetaxel was initiated. Doubling time at that time was about 3 months  Docetaxel chemo initiated for CRPC with ln mets and rapid doubling PSA in august 2018.Patient completed 5 cycles of docetaxel on 09/27/2017. Cycle #6 was not given due to progressive fatigue and cytopenias as well as worsening neuropathy. Cycle 5 of docetaxel was dose reduced to 60 mg/m square.Patient never had normalization of his PSA following docetaxel and the lowest PSA after docetaxel was 5.64 in December 2018  In April 2019 patient noted to have rising PSAto 19.02and increase in the size of retroperitoneal LN. Patient therefore started second line zytiga.  Patient has remained on Zytiga since April 2019. The lowest PSA value on Zytiga was 6.1 in August 2020 and since then there  has been a small but steady increase in his PSA.Most recent imaging in November 2020 showed increase in the size of retroperitoneal lymph nodes from 0.7 cm to 1.1 cm. New periaortic lymph node 1.2 cm. Slight progression of sclerotic bone metastases involving the left scapula as well as T4 and T7. Most recent PSA on 03/07/2020 was 23.9.Given the subtle progression Zytiga was not changed at that time.  repat LN biopsy showed adenocarcinoma prostate. NGSPD-L1 1%. PTEN C388C (R130X)  Patient went to [facility] for second opinion and was recommended Provenge given the rise in PSA and mild radiological progression.Patient had disease progression on Provenge and switch to third line cabazitaxel   Interval history-tolerating chemotherapy well without any significant nausea and vomiting.  He has baseline peripheral neuropathy in his fingertips and toes.  Reports that his neuropathy especially in his bilateral hands is getting worse which makes it difficult for him to sleep at times.  Also reports pain in his back and shoulders which has been a chronic problem.  Reports ongoing fatigue.  He is still taking phentermine for his weight loss  ECOG PS- 1 Pain scale- 3 Opioid associated constipation- no  Review of systems- Review of Systems  Constitutional: Positive for malaise/fatigue. Negative for chills, fever and weight loss.  HENT: Negative for congestion, ear discharge and nosebleeds.   Eyes: Negative for blurred vision.  Respiratory: Negative for cough, hemoptysis, sputum production, shortness of breath and wheezing.   Cardiovascular: Negative for chest pain, palpitations, orthopnea and claudication.  Gastrointestinal: Negative for abdominal pain, blood in stool, constipation, diarrhea, heartburn, melena, nausea and vomiting.  Genitourinary: Negative for dysuria, flank pain,  frequency, hematuria and urgency.  Musculoskeletal: Positive for back pain. Negative for joint pain and myalgias.  Skin:  Negative for rash.  Neurological: Positive for sensory change (Peripheral neuropathy). Negative for dizziness, tingling, focal weakness, seizures, weakness and headaches.  Endo/Heme/Allergies: Does not bruise/bleed easily.  Psychiatric/Behavioral: Negative for depression and suicidal ideas. The patient does not have insomnia.      Allergies  Allergen Reactions  . Latex Rash    Sensitivity, not allergy.     Past Medical History:  Diagnosis Date  . Arthritis, degenerative 10/30/2015  . Atrial fibrillation ([place]) 10/30/2015  . Bladder spasm 06/12/2013  . Carcinoma of prostate ([place]) 10/30/2015  . Cardiomyopathy ([place]) 09/10/2014   Overview:  EF 35%,4/15   . Cerebrovascular accident (CVA) ([place]) 10/30/2015   Overview:  Left embolic [id number],4332   . Difficult or painful urination 11/21/2013  . Dysrhythmia   . Gout 10/30/2015  . History of kidney stones   . Malignant neoplasm of prostate ([place]) 09/05/2012  . Sinoatrial node dysfunction (HCC) 10/30/2015   Overview:  DDD pacemaker, 1996      Past Surgical History:  Procedure Laterality Date  . castration  10/2009  . CATARACT EXTRACTION W/ INTRAOCULAR LENS  IMPLANT, BILATERAL Bilateral 2016   right eye done then the left one done 3 months later in 2016  . COLONOSCOPY WITH PROPOFOL N/A 11/21/2019   Procedure: COLONOSCOPY WITH PROPOFOL;  Surgeon: Robert Bellow, MD;  Location: [facility] ENDOSCOPY;  Service: Endoscopy;  Laterality: N/A;  . INGUINAL HERNIA REPAIR Left   . INSERT / REPLACE / REMOVE PACEMAKER    . IR FLUORO GUIDE PORT INSERTION RIGHT  07/01/2017  . IR REMOVAL TUN ACCESS W/ PORT W/O FL MOD SED  06/20/2020  . IR REMOVAL TUN CV CATH W/O FL  08/01/2020  . JOINT REPLACEMENT Bilateral 1980   hips  . left knee replacement  2014  . PACEMAKER INSERTION    . PORTA CATH INSERTION N/A 09/11/2020   Procedure: PORTA CATH INSERTION;  Surgeon: Algernon Huxley, MD;  Location: [place] CV LAB;  Service: Cardiovascular;  Laterality: N/A;  .  PROSTATECTOMY    . REPLACEMENT TOTAL HIP W/  RESURFACING IMPLANTS  1997   bilateral-   . REPLACEMENT TOTAL KNEE Bilateral   . testes removal     per pt   . TONSILLECTOMY AND ADENOIDECTOMY    . TRANSURETHRAL RESECTION OF BLADDER TUMOR N/A 12/31/2015   Procedure: BLADDER BIOPSY;  Surgeon: Nickie Retort, MD;  Location: [facility] ORS;  Service: Urology;  Laterality: N/A;  . Urinary incontinence valve     AMS    Social History   Socioeconomic History  . Marital status: Married    Spouse name: Not on file  . Number of children: Not on file  . Years of education: Not on file  . Highest education level: Not on file  Occupational History  . Not on file  Tobacco Use  . Smoking status: Never Smoker  . Smokeless tobacco: Never Used  Vaping Use  . Vaping Use: Never used  Substance and Sexual Activity  . Alcohol use: No    Alcohol/week: 0.0 standard drinks  . Drug use: No  . Sexual activity: Never  Other Topics Concern  . Not on file  Social History Narrative   Lives at home with spouse   Social Determinants of Health   Financial Resource Strain:   . Difficulty of Paying Living Expenses: Not on file  Food Insecurity:   .  Worried About Charity fundraiser in the Last Year: Not on file  . Ran Out of Food in the Last Year: Not on file  Transportation Needs:   . Lack of Transportation (Medical): Not on file  . Lack of Transportation (Non-Medical): Not on file  Physical Activity:   . Days of Exercise per Week: Not on file  . Minutes of Exercise per Session: Not on file  Stress:   . Feeling of Stress : Not on file  Social Connections:   . Frequency of Communication with Friends and Family: Not on file  . Frequency of Social Gatherings with Friends and Family: Not on file  . Attends Religious Services: Not on file  . Active Member of Clubs or Organizations: Not on file  . Attends Archivist Meetings: Not on file  . Marital Status: Not on file  Intimate Partner  Violence:   . Fear of Current or Ex-Partner: Not on file  . Emotionally Abused: Not on file  . Physically Abused: Not on file  . Sexually Abused: Not on file    Family History  Problem Relation Age of Onset  . Cancer Mother   . Arthritis Mother   . Heart attack Father   . Arthritis Father   . Transient ischemic attack Brother   . Prostate cancer Neg Hx   . Bladder Cancer Neg Hx      Current Outpatient Medications:  .  acetaminophen (TYLENOL) 325 MG tablet, Take 650 mg by mouth 2 (two) times daily., Disp: , Rfl:  .  allopurinol (ZYLOPRIM) 300 MG tablet, Take by mouth daily. In am., Disp: , Rfl:  .  bisacodyl (DULCOLAX) 5 MG EC tablet, Take 5 mg by mouth daily as needed. , Disp: , Rfl:  .  calcium carbonate (CALCIUM 600) 600 MG TABS tablet, Take 600 mg by mouth 2 (two) times daily with a meal., Disp: , Rfl:  .  cyanocobalamin (,VITAMIN B-12,) 1000 MCG/ML injection, Inject 1,000 mcg into the muscle every 30 (thirty) days. , Disp: , Rfl:  .  diltiazem (CARDIZEM CD) 180 MG 24 hr capsule, Take 180 mg by mouth daily., Disp: , Rfl:  .  furosemide (LASIX) 20 MG tablet, Take 20 mg by mouth daily., Disp: , Rfl:  .  lidocaine-prilocaine (EMLA) cream, Apply to affected area once, Disp: 30 g, Rfl: 3 .  phentermine 15 MG capsule, Take 15 mg by mouth every morning., Disp: , Rfl:  .  potassium chloride (KLOR-CON) 10 MEQ tablet, TAKE 2 TABLETS BY MOUTH ONCE DAILY, Disp: 60 tablet, Rfl: 2 .  simvastatin (ZOCOR) 40 MG tablet, Take 40 mg by mouth daily at 6 PM. , Disp: , Rfl:  .  traMADol (ULTRAM) 50 MG tablet, Take 50 mg by mouth 2 (two) times daily as needed., Disp: , Rfl:  .  warfarin (COUMADIN) 3 MG tablet, Take 3 mg by mouth daily. Takes on Saturday and sunday, Disp: , Rfl:  .  warfarin (COUMADIN) 6 MG tablet, Take 1 tablet by mouth. Monday through  friday, Disp: , Rfl:  .  ondansetron (ZOFRAN) 8 MG tablet, Take 1 tablet (8 mg total) by mouth 2 (two) times daily as needed (Nausea or vomiting).  (Patient not taking: Reported on 10/28/2020), Disp: 30 tablet, Rfl: 1 .  predniSONE (DELTASONE) 5 MG tablet, TAKE 1 TABLET (5 MG TOTAL) BY MOUTH 2 TIMES DAILY WITH A MEAL. (Patient not taking: Reported on 10/28/2020), Disp: 60 tablet, Rfl: 5 .  prochlorperazine (COMPAZINE)  10 MG tablet, Take 1 tablet (10 mg total) by mouth every 6 (six) hours as needed (Nausea or vomiting). (Patient not taking: Reported on 10/28/2020), Disp: 30 tablet, Rfl: 1  Physical exam:  Vitals:   10/28/20 1055  BP: 126/72  Pulse: 63  Resp: 16  Temp: 98.3 F (36.8 C)  TempSrc: Oral  Weight: 294 lb 12.8 oz (133.7 kg)  Height: 6' (1.829 m)   Physical Exam Constitutional:      General: He is not in acute distress. Cardiovascular:     Rate and Rhythm: Normal rate. Rhythm irregular.     Heart sounds: Normal heart sounds.  Pulmonary:     Effort: Pulmonary effort is normal.     Breath sounds: Normal breath sounds.  Abdominal:     General: Bowel sounds are normal.     Palpations: Abdomen is soft.  Skin:    General: Skin is warm and dry.  Neurological:     Mental Status: He is alert and oriented to person, place, and time.      CMP Latest Ref Rng & Units 10/28/2020  Glucose 70 - 99 mg/dL 102(H)  BUN 8 - 23 mg/dL 29(H)  Creatinine 0.61 - 1.24 mg/dL 1.11  Sodium 135 - 145 mmol/L 136  Potassium 3.5 - 5.1 mmol/L 4.0  Chloride 98 - 111 mmol/L 103  CO2 22 - 32 mmol/L 23  Calcium 8.9 - 10.3 mg/dL 9.3  Total Protein 6.5 - 8.1 g/dL 6.6  Total Bilirubin 0.3 - 1.2 mg/dL 1.4(H)  Alkaline Phos 38 - 126 U/L 66  AST 15 - 41 U/L 19  ALT 0 - 44 U/L 10   CBC Latest Ref Rng & Units 10/28/2020  WBC 4.0 - 10.5 K/uL 6.0  Hemoglobin 13.0 - 17.0 g/dL 9.8(L)  Hematocrit 39 - 52 % 29.3(L)  Platelets 150 - 400 K/uL 143(L)      Assessment and plan- Patient is a 74 y.o. 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  He is here for on  treatment assessment prior to cycle 3 of cabazitaxel  Counts okay to proceed with cycle 3 of cabazitaxel with on for Neulasta support today.  I will see him back in 3 weeks for cycle 4.  PSA had come down from 142-41 during his last visit and PSA from today is currently pending.  Plan to get repeat scans after 6 cycles.  Chemo-induced peripheral neuropathy: We will start him on gabapentin 300 mg at night which she will slowly increase to 300 mg 3 times daily  Anemia: Likely secondary to chemotherapy.  Continue to monitor  I have asked him to hold off on taking phentermine for weight loss   Visit Diagnosis 1. Malignant neoplasm of prostate ([place])   2. Chemotherapy-induced peripheral neuropathy ([place])   3. Encounter for antineoplastic chemotherapy      Dr. Randa Evens, MD, MPH [facility] at [facility] [phone] 10/28/2020 2:00 PM

## 2020-10-28 NOTE — Progress Notes (Signed)
Patient tolerated infusion well. Patient and VSS. Patient verbalizes understanding as to when to remove OnPro. Advised patient to contact clinic should he have any questions or concerns. Patient discharged home.

## 2020-11-13 DIAGNOSIS — I480 Paroxysmal atrial fibrillation: Secondary | ICD-10-CM | POA: Diagnosis not present

## 2020-11-18 ENCOUNTER — Inpatient Hospital Stay (HOSPITAL_BASED_OUTPATIENT_CLINIC_OR_DEPARTMENT_OTHER): Payer: PPO | Admitting: Oncology

## 2020-11-18 ENCOUNTER — Other Ambulatory Visit: Payer: PPO

## 2020-11-18 ENCOUNTER — Inpatient Hospital Stay: Payer: PPO | Attending: Oncology

## 2020-11-18 ENCOUNTER — Other Ambulatory Visit: Payer: Self-pay

## 2020-11-18 ENCOUNTER — Ambulatory Visit: Payer: PPO

## 2020-11-18 ENCOUNTER — Ambulatory Visit: Payer: PPO | Admitting: Oncology

## 2020-11-18 ENCOUNTER — Inpatient Hospital Stay: Payer: PPO

## 2020-11-18 ENCOUNTER — Encounter: Payer: Self-pay | Admitting: Oncology

## 2020-11-18 VITALS — BP 128/68 | HR 79 | Temp 97.7°F | Resp 18 | Wt 299.4 lb

## 2020-11-18 DIAGNOSIS — C61 Malignant neoplasm of prostate: Secondary | ICD-10-CM | POA: Diagnosis not present

## 2020-11-18 DIAGNOSIS — G620 Drug-induced polyneuropathy: Secondary | ICD-10-CM

## 2020-11-18 DIAGNOSIS — Z9079 Acquired absence of other genital organ(s): Secondary | ICD-10-CM | POA: Diagnosis not present

## 2020-11-18 DIAGNOSIS — Z7952 Long term (current) use of systemic steroids: Secondary | ICD-10-CM | POA: Diagnosis not present

## 2020-11-18 DIAGNOSIS — D696 Thrombocytopenia, unspecified: Secondary | ICD-10-CM | POA: Diagnosis not present

## 2020-11-18 DIAGNOSIS — Z5111 Encounter for antineoplastic chemotherapy: Secondary | ICD-10-CM | POA: Diagnosis not present

## 2020-11-18 DIAGNOSIS — C772 Secondary and unspecified malignant neoplasm of intra-abdominal lymph nodes: Secondary | ICD-10-CM | POA: Insufficient documentation

## 2020-11-18 DIAGNOSIS — Z5189 Encounter for other specified aftercare: Secondary | ICD-10-CM | POA: Insufficient documentation

## 2020-11-18 DIAGNOSIS — C7951 Secondary malignant neoplasm of bone: Secondary | ICD-10-CM | POA: Insufficient documentation

## 2020-11-18 DIAGNOSIS — T451X5D Adverse effect of antineoplastic and immunosuppressive drugs, subsequent encounter: Secondary | ICD-10-CM | POA: Diagnosis not present

## 2020-11-18 DIAGNOSIS — Z809 Family history of malignant neoplasm, unspecified: Secondary | ICD-10-CM | POA: Diagnosis not present

## 2020-11-18 DIAGNOSIS — Z95828 Presence of other vascular implants and grafts: Secondary | ICD-10-CM

## 2020-11-18 LAB — CBC WITH DIFFERENTIAL/PLATELET
Abs Immature Granulocytes: 0.03 10*3/uL (ref 0.00–0.07)
Basophils Absolute: 0 10*3/uL (ref 0.0–0.1)
Basophils Relative: 0 %
Eosinophils Absolute: 0.2 10*3/uL (ref 0.0–0.5)
Eosinophils Relative: 3 %
HCT: 29.2 % — ABNORMAL LOW (ref 39.0–52.0)
Hemoglobin: 9.5 g/dL — ABNORMAL LOW (ref 13.0–17.0)
Immature Granulocytes: 1 %
Lymphocytes Relative: 12 %
Lymphs Abs: 0.7 10*3/uL (ref 0.7–4.0)
MCH: 30.8 pg (ref 26.0–34.0)
MCHC: 32.5 g/dL (ref 30.0–36.0)
MCV: 94.8 fL (ref 80.0–100.0)
Monocytes Absolute: 0.6 10*3/uL (ref 0.1–1.0)
Monocytes Relative: 10 %
Neutro Abs: 4.3 10*3/uL (ref 1.7–7.7)
Neutrophils Relative %: 74 %
Platelets: 140 10*3/uL — ABNORMAL LOW (ref 150–400)
RBC: 3.08 MIL/uL — ABNORMAL LOW (ref 4.22–5.81)
RDW: 16.6 % — ABNORMAL HIGH (ref 11.5–15.5)
WBC: 5.8 10*3/uL (ref 4.0–10.5)
nRBC: 0 % (ref 0.0–0.2)

## 2020-11-18 LAB — COMPREHENSIVE METABOLIC PANEL
ALT: 9 U/L (ref 0–44)
AST: 16 U/L (ref 15–41)
Albumin: 3.9 g/dL (ref 3.5–5.0)
Alkaline Phosphatase: 57 U/L (ref 38–126)
Anion gap: 11 (ref 5–15)
BUN: 20 mg/dL (ref 8–23)
CO2: 22 mmol/L (ref 22–32)
Calcium: 8.2 mg/dL — ABNORMAL LOW (ref 8.9–10.3)
Chloride: 103 mmol/L (ref 98–111)
Creatinine, Ser: 1.03 mg/dL (ref 0.61–1.24)
GFR, Estimated: >60 mL/min (ref >60)
Glucose, Bld: 106 mg/dL — ABNORMAL HIGH (ref 70–99)
Potassium: 4 mmol/L (ref 3.5–5.1)
Sodium: 136 mmol/L (ref 135–145)
Total Bilirubin: 0.8 mg/dL (ref 0.3–1.2)
Total Protein: 6.6 g/dL (ref 6.5–8.1)

## 2020-11-18 LAB — PSA: Prostatic Specific Antigen: 39.4 ng/mL — ABNORMAL HIGH (ref 0.00–4.00)

## 2020-11-18 MED ORDER — PEGFILGRASTIM 6 MG/0.6ML ~~LOC~~ PSKT
6.0000 mg | PREFILLED_SYRINGE | Freq: Once | SUBCUTANEOUS | Status: AC
  Administered 2020-11-18: 6 mg via SUBCUTANEOUS
  Filled 2020-11-18: qty 0.6

## 2020-11-18 MED ORDER — SODIUM CHLORIDE 0.9 % IV SOLN
15.0000 mg/m2 | Freq: Once | INTRAVENOUS | Status: AC
  Administered 2020-11-18: 39 mg via INTRAVENOUS
  Filled 2020-11-18: qty 3.9

## 2020-11-18 MED ORDER — DIPHENHYDRAMINE HCL 50 MG/ML IJ SOLN
25.0000 mg | Freq: Once | INTRAMUSCULAR | Status: AC
  Administered 2020-11-18: 25 mg via INTRAVENOUS
  Filled 2020-11-18: qty 1

## 2020-11-18 MED ORDER — SODIUM CHLORIDE 0.9 % IV SOLN
Freq: Once | INTRAVENOUS | Status: AC
  Filled 2020-11-18: qty 250

## 2020-11-18 MED ORDER — SODIUM CHLORIDE 0.9% FLUSH
10.0000 mL | Freq: Once | INTRAVENOUS | Status: AC
  Administered 2020-11-18: 10 mL via INTRAVENOUS
  Filled 2020-11-18: qty 10

## 2020-11-18 MED ORDER — FAMOTIDINE IN NACL 20-0.9 MG/50ML-% IV SOLN
20.0000 mg | Freq: Once | INTRAVENOUS | Status: AC
  Administered 2020-11-18: 20 mg via INTRAVENOUS
  Filled 2020-11-18: qty 50

## 2020-11-18 MED ORDER — SODIUM CHLORIDE 0.9% FLUSH
10.0000 mL | INTRAVENOUS | Status: DC | PRN
  Filled 2020-11-18: qty 10

## 2020-11-18 MED ORDER — POTASSIUM CHLORIDE ER 10 MEQ PO TBCR
20.0000 meq | EXTENDED_RELEASE_TABLET | Freq: Every day | ORAL | 2 refills | Status: DC
Start: 2020-11-18 — End: 2021-02-25

## 2020-11-18 MED ORDER — HEPARIN SOD (PORK) LOCK FLUSH 100 UNIT/ML IV SOLN
500.0000 [IU] | Freq: Once | INTRAVENOUS | Status: AC | PRN
  Administered 2020-11-18: 500 [IU]
  Filled 2020-11-18: qty 5

## 2020-11-18 MED ORDER — HEPARIN SOD (PORK) LOCK FLUSH 100 UNIT/ML IV SOLN
500.0000 [IU] | Freq: Once | INTRAVENOUS | Status: DC
  Filled 2020-11-18: qty 5

## 2020-11-18 MED ORDER — SODIUM CHLORIDE 0.9 % IV SOLN
10.0000 mg | Freq: Once | INTRAVENOUS | Status: AC
  Administered 2020-11-18: 10 mg via INTRAVENOUS
  Filled 2020-11-18: qty 10

## 2020-11-18 MED ORDER — HEPARIN SOD (PORK) LOCK FLUSH 100 UNIT/ML IV SOLN
INTRAVENOUS | Status: AC
  Filled 2020-11-18: qty 5

## 2020-11-18 NOTE — Progress Notes (Signed)
Cabazitaxel well tolerated. Neualsta OnPro applied. D/c home in stable condition.

## 2020-11-20 NOTE — Progress Notes (Signed)
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 . He again began to have a rising PSA in 2010 and underwent orchiectomy in 2010.He had biochemical recurrence in 2017 and was treated with IMRT  He underwent CT abdomen and pelvis in May 2018which showed abnormal periaortic and right common iliac lymph nodes. Index aortocaval lymph node 1.4 cm which was new as compared to February 2014 and suspicious for malignancy. Bone scan did not reveal any evidence of bony metastatic disease.PSA went up from 3.9 in January 20 18-7.8 in April 2018. 13.3 in July 2018 when docetaxel was initiated. Doubling time at that time was about 3 months  Docetaxel chemo initiated for CRPC with ln mets and rapid doubling PSA in august 2018.Patient completed 5 cycles of docetaxel on 09/27/2017. Cycle #6 was not given due to progressive fatigue and cytopenias as well as worsening neuropathy. Cycle 5 of docetaxel was dose reduced to 60 mg/m square.Patient never had normalization of his PSA following docetaxel and the lowest PSA after docetaxel was 5.64 in December 2018  In April 2019 patient noted to have rising PSAto 19.02and increase in the size of retroperitoneal LN. Patient therefore started second line zytiga.  Patient has remained on Zytiga since April 2019. The lowest PSA value on Zytiga was 6.1 in August 2020 and  since then there has been a small but steady increase in his PSA.Most recent imaging in November 2020 showed increase in the size of retroperitoneal lymph nodes from 0.7 cm to 1.1 cm. New periaortic lymph node 1.2 cm. Slight progression of sclerotic bone metastases involving the left scapula as well as T4 and T7. Most recent PSA on 03/07/2020 was 23.9.Given the subtle progression Zytiga was not changed at that time.  repat LN biopsy showed adenocarcinoma prostate. NGSPD-L1 1%. PTEN C388C (R130X)  Patient went to [facility] for [facility] opinion and was recommended Provenge given the rise in PSA and mild radiological progression.Patient had disease progression on Provenge and switch to third line cabazitaxel   Interval history-tolerating chemotherapy well without any significant side effects.  Neuropathy is currently stable.  Denies any nausea vomiting or new worsening pain.1  ECOG PS- 1 Pain scale- 0 Opioid associated constipation- no  Review of systems- Review of Systems  Constitutional: Positive for malaise/fatigue. Negative for chills, fever and weight loss.  HENT: Negative for congestion, ear discharge and nosebleeds.   Eyes: Negative for blurred vision.  Respiratory: Negative for cough, hemoptysis, sputum production, shortness of breath and wheezing.   Cardiovascular: Negative for chest pain, palpitations, orthopnea and claudication.  Gastrointestinal: Negative for abdominal pain, blood in stool, constipation, diarrhea, heartburn, melena, nausea and vomiting.  Genitourinary: Negative for dysuria, flank pain, frequency, hematuria and urgency.  Musculoskeletal: Negative for back pain, joint pain and myalgias.  Skin: Negative for rash.  Neurological: Negative for dizziness, tingling, focal weakness, seizures, weakness and headaches.  Endo/Heme/Allergies: Does not bruise/bleed easily.  Psychiatric/Behavioral: Negative for depression and suicidal ideas. The patient does not  have insomnia.        Allergies  Allergen Reactions  . Latex Rash    Sensitivity, not allergy.     Past Medical History:  Diagnosis Date  . Arthritis, degenerative 10/30/2015  . Atrial fibrillation ([place]) 10/30/2015  . Bladder spasm 06/12/2013  . Carcinoma of prostate ([place]) 10/30/2015  . Cardiomyopathy ([place]) 09/10/2014   Overview:  EF 35%,4/15   . Cerebrovascular accident (CVA) ([place]) 10/30/2015   Overview:  Left embolic NLG,9211   . Difficult or painful urination 11/21/2013  . Dysrhythmia   . Gout 10/30/2015  . History of kidney stones   . Malignant neoplasm of prostate ([place]) 09/05/2012  . Sinoatrial node dysfunction (HCC) 10/30/2015   Overview:  DDD pacemaker, 1996      Past Surgical History:  Procedure Laterality Date  . castration  10/2009  . CATARACT EXTRACTION W/ INTRAOCULAR LENS  IMPLANT, BILATERAL Bilateral 2016   right eye done then the left one done 3 months later in 2016  . COLONOSCOPY WITH PROPOFOL N/A 11/21/2019   Procedure: COLONOSCOPY WITH PROPOFOL;  Surgeon: Robert Bellow, MD;  Location: [facility] ENDOSCOPY;  Service: Endoscopy;  Laterality: N/A;  . INGUINAL HERNIA REPAIR Left   . INSERT / REPLACE / REMOVE PACEMAKER    . IR FLUORO GUIDE PORT INSERTION RIGHT  07/01/2017  . IR REMOVAL TUN ACCESS W/ PORT W/O FL MOD SED  06/20/2020  . IR REMOVAL TUN CV CATH W/O FL  08/01/2020  . JOINT REPLACEMENT Bilateral 1980   hips  . left knee replacement  2014  . PACEMAKER INSERTION    . PORTA CATH INSERTION N/A 09/11/2020   Procedure: PORTA CATH INSERTION;  Surgeon: Algernon Huxley, MD;  Location: [place] CV LAB;  Service: Cardiovascular;  Laterality: N/A;  . PROSTATECTOMY    . REPLACEMENT TOTAL HIP W/  RESURFACING IMPLANTS  1997   bilateral-   . REPLACEMENT TOTAL KNEE Bilateral   . testes removal     per pt   . TONSILLECTOMY AND ADENOIDECTOMY    . TRANSURETHRAL RESECTION OF BLADDER TUMOR N/A 12/31/2015   Procedure: BLADDER BIOPSY;  Surgeon: Nickie Retort, MD;  Location: [facility]  ORS;  Service: Urology;  Laterality: N/A;  . Urinary incontinence valve     AMS    Social History   Socioeconomic History  . Marital status: Married    Spouse name: Not on file  . Number of children: Not on file  . Years of education: Not on file  . Highest education level: Not on file  Occupational History  . Not on file  Tobacco Use  . Smoking status: Never Smoker  . Smokeless tobacco: Never Used  Vaping Use  . Vaping Use: Never used  Substance and Sexual Activity  . Alcohol use: No    Alcohol/week: 0.0 standard drinks  . Drug use: No  . Sexual activity: Never  Other Topics Concern  . Not on file  Social History Narrative   Lives at home with spouse   Social Determinants of Health   Financial Resource Strain: Not on file  Food Insecurity: Not on file  Transportation Needs: Not on file  Physical Activity: Not on file  Stress: Not on file  Social Connections: Not on file  Intimate Partner Violence: Not on file    Family History  Problem Relation Age of Onset  . Cancer Mother   . Arthritis Mother   . Heart attack Father   . Arthritis Father   .  Transient ischemic attack Brother   . Prostate cancer Neg Hx   . Bladder Cancer Neg Hx      Current Outpatient Medications:  .  acetaminophen (TYLENOL) 325 MG tablet, Take 650 mg by mouth 2 (two) times daily., Disp: , Rfl:  .  allopurinol (ZYLOPRIM) 300 MG tablet, Take by mouth daily. In am., Disp: , Rfl:  .  bisacodyl (DULCOLAX) 5 MG EC tablet, Take 5 mg by mouth daily as needed. , Disp: , Rfl:  .  calcium carbonate (CALCIUM 600) 600 MG TABS tablet, Take 600 mg by mouth 2 (two) times daily with a meal., Disp: , Rfl:  .  cyanocobalamin (,VITAMIN B-12,) 1000 MCG/ML injection, Inject 1,000 mcg into the muscle every 30 (thirty) days. , Disp: , Rfl:  .  diltiazem (CARDIZEM CD) 180 MG 24 hr capsule, Take 180 mg by mouth daily., Disp: , Rfl:  .  furosemide (LASIX) 20 MG tablet, Take 20 mg by mouth daily., Disp: , Rfl:  .   gabapentin (NEURONTIN) 300 MG capsule, Take 1 capsule (300 mg total) by mouth 3 (three) times daily. To start-Take 1 capsule at bedtime for 3 days, increase to 1 capsule twice daily,after 3 days increased to 1 capsule three times daily., Disp: 90 capsule, Rfl: 3 .  lidocaine-prilocaine (EMLA) cream, Apply to affected area once, Disp: 30 g, Rfl: 3 .  potassium chloride (KLOR-CON) 10 MEQ tablet, Take 2 tablets (20 mEq total) by mouth daily., Disp: 60 tablet, Rfl: 2 .  simvastatin (ZOCOR) 40 MG tablet, Take 40 mg by mouth daily at 6 PM. , Disp: , Rfl:  .  warfarin (COUMADIN) 3 MG tablet, Take 3 mg by mouth daily. Takes on Saturday and sunday, Disp: , Rfl:  .  warfarin (COUMADIN) 6 MG tablet, Take 1 tablet by mouth. Monday through  friday, Disp: , Rfl:  .  phentermine 15 MG capsule, Take 15 mg by mouth every morning. (Patient not taking: Reported on 11/18/2020), Disp: , Rfl:  .  traMADol (ULTRAM) 50 MG tablet, Take 50 mg by mouth 2 (two) times daily as needed. (Patient not taking: Reported on 11/18/2020), Disp: , Rfl:   Physical exam:  Vitals:   11/18/20 1105  BP: 128/68  Pulse: 79  Resp: 18  Temp: 97.7 F (36.5 C)  TempSrc: Tympanic  SpO2: 98%  Weight: 299 lb 6.4 oz (135.8 kg)   Physical Exam Constitutional:      General: He is not in acute distress. Eyes:     Extraocular Movements: EOM normal.  Cardiovascular:     Rate and Rhythm: Normal rate and regular rhythm.     Heart sounds: Normal heart sounds.  Pulmonary:     Effort: Pulmonary effort is normal.     Breath sounds: Normal breath sounds.  Abdominal:     General: Bowel sounds are normal.     Palpations: Abdomen is soft.  Skin:    General: Skin is warm and dry.  Neurological:     Mental Status: He is alert and oriented to person, place, and time.      CMP Latest Ref Rng & Units 11/18/2020  Glucose 70 - 99 mg/dL 106(H)  BUN 8 - 23 mg/dL 20  Creatinine 0.61 - 1.24 mg/dL 1.03  Sodium 135 - 145 mmol/L 136  Potassium 3.5 - 5.1  mmol/L 4.0  Chloride 98 - 111 mmol/L 103  CO2 22 - 32 mmol/L 22  Calcium 8.9 - 10.3 mg/dL 8.2(L)  Total Protein 6.5 -  8.1 g/dL 6.6  Total Bilirubin 0.3 - 1.2 mg/dL 0.8  Alkaline Phos 38 - 126 U/L 57  AST 15 - 41 U/L 16  ALT 0 - 44 U/L 9   CBC Latest Ref Rng & Units 11/18/2020  WBC 4.0 - 10.5 K/uL 5.8  Hemoglobin 13.0 - 17.0 g/dL 9.5(L)  Hematocrit 39.0 - 52.0 % 29.2(L)  Platelets 150 - 400 K/uL 140(L)      Assessment and plan- Patient is a 74 y.o. 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  He is here for on treatment assessment prior to cycle 4 of cabazitaxel  Counts okay to proceed with cycle 4 of cabazitaxel chemotherapy today with on for Neulasta support.  Presently his hemoglobin is remaining stable between 9-10 and he has mild stable chronic thrombocytopenia.I will see him back in 3 weeks for cycle 5.  Plan to repeat scans after 6 cycles.   Chemo-induced peripheral neuropathy: Currently stable on gabapentin   Visit Diagnosis 1. Encounter for antineoplastic chemotherapy   2. Chemotherapy-induced peripheral neuropathy ([place])   3. Malignant neoplasm of prostate [facility])      Dr. Randa Evens, MD, MPH [facility] at [facility] [phone] 11/20/2020 3:38 PM

## 2020-12-08 ENCOUNTER — Other Ambulatory Visit: Payer: Self-pay

## 2020-12-08 DIAGNOSIS — C61 Malignant neoplasm of prostate: Secondary | ICD-10-CM

## 2020-12-08 NOTE — Progress Notes (Unsigned)
A 

## 2020-12-09 ENCOUNTER — Inpatient Hospital Stay (HOSPITAL_BASED_OUTPATIENT_CLINIC_OR_DEPARTMENT_OTHER): Payer: PPO | Admitting: Oncology

## 2020-12-09 ENCOUNTER — Inpatient Hospital Stay: Payer: PPO

## 2020-12-09 ENCOUNTER — Other Ambulatory Visit: Payer: Self-pay

## 2020-12-09 ENCOUNTER — Encounter: Payer: Self-pay | Admitting: Oncology

## 2020-12-09 VITALS — BP 130/78 | HR 85 | Temp 98.1°F | Resp 18 | Wt 304.6 lb

## 2020-12-09 DIAGNOSIS — C61 Malignant neoplasm of prostate: Secondary | ICD-10-CM

## 2020-12-09 DIAGNOSIS — D6481 Anemia due to antineoplastic chemotherapy: Secondary | ICD-10-CM

## 2020-12-09 DIAGNOSIS — T451X5A Adverse effect of antineoplastic and immunosuppressive drugs, initial encounter: Secondary | ICD-10-CM

## 2020-12-09 DIAGNOSIS — G620 Drug-induced polyneuropathy: Secondary | ICD-10-CM | POA: Diagnosis not present

## 2020-12-09 DIAGNOSIS — Z5111 Encounter for antineoplastic chemotherapy: Secondary | ICD-10-CM

## 2020-12-09 DIAGNOSIS — Z7983 Long term (current) use of bisphosphonates: Secondary | ICD-10-CM

## 2020-12-09 DIAGNOSIS — E538 Deficiency of other specified B group vitamins: Secondary | ICD-10-CM

## 2020-12-09 DIAGNOSIS — E860 Dehydration: Secondary | ICD-10-CM

## 2020-12-09 DIAGNOSIS — T451X5D Adverse effect of antineoplastic and immunosuppressive drugs, subsequent encounter: Secondary | ICD-10-CM | POA: Diagnosis not present

## 2020-12-09 LAB — COMPREHENSIVE METABOLIC PANEL
ALT: 11 U/L (ref 0–44)
AST: 21 U/L (ref 15–41)
Albumin: 3.8 g/dL (ref 3.5–5.0)
Alkaline Phosphatase: 75 U/L (ref 38–126)
Anion gap: 10 (ref 5–15)
BUN: 24 mg/dL — ABNORMAL HIGH (ref 8–23)
CO2: 24 mmol/L (ref 22–32)
Calcium: 9.1 mg/dL (ref 8.9–10.3)
Chloride: 103 mmol/L (ref 98–111)
Creatinine, Ser: 1.11 mg/dL (ref 0.61–1.24)
GFR, Estimated: >60 mL/min (ref >60)
Glucose, Bld: 98 mg/dL (ref 70–99)
Potassium: 4.1 mmol/L (ref 3.5–5.1)
Sodium: 137 mmol/L (ref 135–145)
Total Bilirubin: 0.9 mg/dL (ref 0.3–1.2)
Total Protein: 6.5 g/dL (ref 6.5–8.1)

## 2020-12-09 LAB — CBC WITH DIFFERENTIAL/PLATELET
Abs Immature Granulocytes: 0.03 10*3/uL (ref 0.00–0.07)
Basophils Absolute: 0 10*3/uL (ref 0.0–0.1)
Basophils Relative: 0 %
Eosinophils Absolute: 0.4 10*3/uL (ref 0.0–0.5)
Eosinophils Relative: 5 %
HCT: 30 % — ABNORMAL LOW (ref 39.0–52.0)
Hemoglobin: 9.6 g/dL — ABNORMAL LOW (ref 13.0–17.0)
Immature Granulocytes: 1 %
Lymphocytes Relative: 14 %
Lymphs Abs: 0.9 10*3/uL (ref 0.7–4.0)
MCH: 30.3 pg (ref 26.0–34.0)
MCHC: 32 g/dL (ref 30.0–36.0)
MCV: 94.6 fL (ref 80.0–100.0)
Monocytes Absolute: 0.5 10*3/uL (ref 0.1–1.0)
Monocytes Relative: 8 %
Neutro Abs: 4.8 10*3/uL (ref 1.7–7.7)
Neutrophils Relative %: 72 %
Platelets: 162 10*3/uL (ref 150–400)
RBC: 3.17 MIL/uL — ABNORMAL LOW (ref 4.22–5.81)
RDW: 16.9 % — ABNORMAL HIGH (ref 11.5–15.5)
WBC: 6.6 10*3/uL (ref 4.0–10.5)
nRBC: 0 % (ref 0.0–0.2)

## 2020-12-09 LAB — PSA: Prostatic Specific Antigen: 44.66 ng/mL — ABNORMAL HIGH (ref 0.00–4.00)

## 2020-12-09 MED ORDER — SODIUM CHLORIDE 0.9 % IV SOLN
15.0000 mg/m2 | Freq: Once | INTRAVENOUS | Status: AC
  Administered 2020-12-09: 12:00:00 39 mg via INTRAVENOUS
  Filled 2020-12-09: qty 3.9

## 2020-12-09 MED ORDER — HEPARIN SOD (PORK) LOCK FLUSH 100 UNIT/ML IV SOLN
500.0000 [IU] | Freq: Once | INTRAVENOUS | Status: AC | PRN
  Administered 2020-12-09: 14:00:00 500 [IU]
  Filled 2020-12-09: qty 5

## 2020-12-09 MED ORDER — SODIUM CHLORIDE 0.9 % IV SOLN
10.0000 mg | Freq: Once | INTRAVENOUS | Status: AC
  Administered 2020-12-09: 12:00:00 10 mg via INTRAVENOUS
  Filled 2020-12-09: qty 10

## 2020-12-09 MED ORDER — SODIUM CHLORIDE 0.9% FLUSH
10.0000 mL | Freq: Once | INTRAVENOUS | Status: AC
  Administered 2020-12-09: 10:00:00 10 mL via INTRAVENOUS
  Filled 2020-12-09: qty 10

## 2020-12-09 MED ORDER — CYANOCOBALAMIN 1000 MCG/ML IJ SOLN
1000.0000 ug | Freq: Once | INTRAMUSCULAR | Status: AC
  Administered 2020-12-09: 12:00:00 1000 ug via INTRAMUSCULAR
  Filled 2020-12-09: qty 1

## 2020-12-09 MED ORDER — HEPARIN SOD (PORK) LOCK FLUSH 100 UNIT/ML IV SOLN
INTRAVENOUS | Status: AC
  Filled 2020-12-09: qty 5

## 2020-12-09 MED ORDER — PEGFILGRASTIM 6 MG/0.6ML ~~LOC~~ PSKT
6.0000 mg | PREFILLED_SYRINGE | Freq: Once | SUBCUTANEOUS | Status: AC
  Administered 2020-12-09: 14:00:00 6 mg via SUBCUTANEOUS
  Filled 2020-12-09: qty 0.6

## 2020-12-09 MED ORDER — FAMOTIDINE IN NACL 20-0.9 MG/50ML-% IV SOLN
20.0000 mg | Freq: Once | INTRAVENOUS | Status: AC
  Administered 2020-12-09: 12:00:00 20 mg via INTRAVENOUS
  Filled 2020-12-09: qty 50

## 2020-12-09 MED ORDER — DENOSUMAB 120 MG/1.7ML ~~LOC~~ SOLN
120.0000 mg | Freq: Once | SUBCUTANEOUS | Status: AC
  Administered 2020-12-09: 14:00:00 120 mg via SUBCUTANEOUS
  Filled 2020-12-09: qty 1.7

## 2020-12-09 MED ORDER — SODIUM CHLORIDE 0.9 % IV SOLN
Freq: Once | INTRAVENOUS | Status: AC
  Filled 2020-12-09: qty 250

## 2020-12-09 MED ORDER — DIPHENHYDRAMINE HCL 50 MG/ML IJ SOLN
25.0000 mg | Freq: Once | INTRAMUSCULAR | Status: AC
  Administered 2020-12-09: 12:00:00 25 mg via INTRAVENOUS
  Filled 2020-12-09: qty 1

## 2020-12-09 NOTE — Progress Notes (Signed)
Jeremy Carney tolerated his treatment well today without any complications. Vitamin B12 was added on today per Dr.Rao.

## 2020-12-09 NOTE — Progress Notes (Addendum)
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 . He again began to have a rising PSA in 2010 and underwent orchiectomy in 2010.He had biochemical recurrence in 2017 and was treated with IMRT  He underwent CT abdomen and pelvis in May 2018which showed abnormal periaortic and right common iliac lymph nodes. Index aortocaval lymph node 1.4 cm which was new as compared to February 2014 and suspicious for malignancy. Bone scan did not reveal any evidence of bony metastatic disease.PSA went up from 3.9 in January 20 18-7.8 in April 2018. 13.3 in July 2018 when docetaxel was initiated. Doubling time at that time was about 3 months  Docetaxel chemo initiated for CRPC with ln mets and rapid doubling PSA in august 2018.Patient completed 5 cycles of docetaxel on 09/27/2017. Cycle #6 was not given due to progressive fatigue and cytopenias as well as worsening neuropathy. Cycle 5 of docetaxel was dose reduced to 60 mg/m square.Patient never had normalization of his PSA following docetaxel and the lowest PSA after docetaxel was 5.64 in December 2018  In April 2019 patient noted to have rising PSAto 19.02and increase in the size of retroperitoneal LN. Patient therefore started second line zytiga.  Patient has remained on Zytiga since April 2019. The lowest PSA value on Zytiga was 6.1 in August 2020 and  since then there has been a small but steady increase in his PSA.Most recent imaging in November 2020 showed increase in the size of retroperitoneal lymph nodes from 0.7 cm to 1.1 cm. New periaortic lymph node 1.2 cm. Slight progression of sclerotic bone metastases involving the left scapula as well as T4 and T7. Most recent PSA on 03/07/2020 was 23.9.Given the subtle progression Zytiga was not changed at that time.  repat LN biopsy showed adenocarcinoma prostate. NGSPD-L1 1%. PTEN C388C (R130X)  Patient went to [facility] for second opinion and was recommended Provenge given the rise in PSA and mild radiological progression.Patient had disease progression on Provenge and switch to third line cabazitaxel   Interval history-reports increased bilateral lower extremity swelling.  Feels that his neuropathy is a little worse than before.  He is presently on gabapentin 300 mg 3 times a day  ECOG PS- 1 Pain scale- 3 Opioid associated constipation- no  Review of systems- Review of Systems  Constitutional: Negative for chills, fever, malaise/fatigue and weight loss.  HENT: Negative for congestion, ear discharge and nosebleeds.   Eyes: Negative for blurred vision.  Respiratory: Negative for cough, hemoptysis, sputum production, shortness of breath and wheezing.   Cardiovascular: Positive for leg swelling. Negative for chest pain, palpitations, orthopnea and claudication.  Gastrointestinal: Negative for abdominal pain, blood in stool, constipation, diarrhea, heartburn, melena, nausea and vomiting.  Genitourinary: Negative for dysuria, flank pain, frequency, hematuria and urgency.  Musculoskeletal: Negative for back pain, joint pain and myalgias.  Skin: Negative for rash.  Neurological: Negative for dizziness, tingling, focal weakness, seizures, weakness and headaches.  Endo/Heme/Allergies: Does not bruise/bleed easily.  Psychiatric/Behavioral:  Negative for depression and suicidal ideas. The  patient does not have insomnia.       Allergies  Allergen Reactions  . Latex Rash    Sensitivity, not allergy.     Past Medical History:  Diagnosis Date  . Arthritis, degenerative 10/30/2015  . Atrial fibrillation ([place]) 10/30/2015  . Bladder spasm 06/12/2013  . Carcinoma of prostate ([place]) 10/30/2015  . Cardiomyopathy ([place]) 09/10/2014   Overview:  EF 35%,4/15   . Cerebrovascular accident (CVA) ([place]) 10/30/2015   Overview:  Left embolic [id number]   . Difficult or painful urination 11/21/2013  . Dysrhythmia   . Gout 10/30/2015  . History of kidney stones   . Malignant neoplasm of prostate ([place]) 09/05/2012  . Sinoatrial node dysfunction (HCC) 10/30/2015   Overview:  DDD pacemaker, 1996      Past Surgical History:  Procedure Laterality Date  . castration  10/2009  . CATARACT EXTRACTION W/ INTRAOCULAR LENS  IMPLANT, BILATERAL Bilateral 2016   right eye done then the left one done 3 months later in 2016  . COLONOSCOPY WITH PROPOFOL N/A 11/21/2019   Procedure: COLONOSCOPY WITH PROPOFOL;  Surgeon: Robert Bellow, MD;  Location: [facility] ENDOSCOPY;  Service: Endoscopy;  Laterality: N/A;  . INGUINAL HERNIA REPAIR Left   . INSERT / REPLACE / REMOVE PACEMAKER    . IR FLUORO GUIDE PORT INSERTION RIGHT  07/01/2017  . IR REMOVAL TUN ACCESS W/ PORT W/O FL MOD SED  06/20/2020  . IR REMOVAL TUN CV CATH W/O FL  08/01/2020  . JOINT REPLACEMENT Bilateral 1980   hips  . left knee replacement  2014  . PACEMAKER INSERTION    . PORTA CATH INSERTION N/A 09/11/2020   Procedure: PORTA CATH INSERTION;  Surgeon: Algernon Huxley, MD;  Location: [place] [facility];  Service: Cardiovascular;  Laterality: N/A;  . PROSTATECTOMY    . REPLACEMENT TOTAL HIP W/  RESURFACING IMPLANTS  1997   bilateral-   . REPLACEMENT TOTAL KNEE Bilateral   . testes removal     per pt   . TONSILLECTOMY AND ADENOIDECTOMY    . TRANSURETHRAL RESECTION OF BLADDER TUMOR N/A 12/31/2015   Procedure: BLADDER BIOPSY;  Surgeon: Nickie Retort, MD;  Location: [facility] ORS;  Service: Urology;  Laterality: N/A;  . Urinary incontinence valve     AMS    Social History   Socioeconomic History  . Marital status: Married    Spouse name: Not on file  . Number of children: Not on file  . Years of education: Not on file  . Highest education level: Not on file  Occupational History  . Not on file  Tobacco Use  . Smoking status: Never Smoker  . Smokeless tobacco: Never Used  Vaping Use  . Vaping Use: Never used  Substance and Sexual Activity  . Alcohol use: No    Alcohol/week: 0.0 standard drinks  . Drug use: No  . Sexual activity: Never  Other Topics Concern  . Not on file  Social History Narrative   Lives at home with spouse   Social Determinants of Health   Financial Resource Strain: Not on file  Food Insecurity: Not on file  Transportation Needs: Not on file  Physical Activity: Not on file  Stress: Not on file  Social Connections: Not on file  Intimate Partner Violence: Not on file    Family History  Problem Relation Age of Onset  . Cancer Mother   . Arthritis Mother   . Heart  attack Father   . Arthritis Father   . Transient ischemic attack Brother   . Prostate cancer Neg Hx   . Bladder Cancer Neg Hx      Current Outpatient Medications:  .  acetaminophen (TYLENOL) 325 MG tablet, Take 650 mg by mouth 2 (two) times daily., Disp: , Rfl:  .  allopurinol (ZYLOPRIM) 300 MG tablet, Take by mouth daily. In am., Disp: , Rfl:  .  bisacodyl (DULCOLAX) 5 MG EC tablet, Take 5 mg by mouth daily as needed. , Disp: , Rfl:  .  calcium carbonate (CALCIUM 600) 600 MG TABS tablet, Take 600 mg by mouth 2 (two) times daily with a meal., Disp: , Rfl:  .  cyanocobalamin (,VITAMIN B-12,) 1000 MCG/ML injection, Inject 1,000 mcg into the muscle every 30 (thirty) days. , Disp: , Rfl:  .  diltiazem (CARDIZEM CD) 180 MG 24 hr capsule, Take 180 mg by mouth daily., Disp: , Rfl:  .  furosemide (LASIX) 20 MG tablet, Take 20 mg by  mouth daily., Disp: , Rfl:  .  gabapentin (NEURONTIN) 300 MG capsule, Take 1 capsule (300 mg total) by mouth 3 (three) times daily. To start-Take 1 capsule at bedtime for 3 days, increase to 1 capsule twice daily,after 3 days increased to 1 capsule three times daily., Disp: 90 capsule, Rfl: 3 .  lidocaine-prilocaine (EMLA) cream, Apply to affected area once, Disp: 30 g, Rfl: 3 .  phentermine 15 MG capsule, Take 15 mg by mouth every morning. (Patient not taking: Reported on 11/18/2020), Disp: , Rfl:  .  potassium chloride (KLOR-CON) 10 MEQ tablet, Take 2 tablets (20 mEq total) by mouth daily., Disp: 60 tablet, Rfl: 2 .  simvastatin (ZOCOR) 40 MG tablet, Take 40 mg by mouth daily at 6 PM. , Disp: , Rfl:  .  traMADol (ULTRAM) 50 MG tablet, Take 50 mg by mouth 2 (two) times daily as needed. (Patient not taking: Reported on 11/18/2020), Disp: , Rfl:  .  warfarin (COUMADIN) 3 MG tablet, Take 3 mg by mouth daily. Takes on Saturday and sunday, Disp: , Rfl:  .  warfarin (COUMADIN) 6 MG tablet, Take 1 tablet by mouth. Monday through  friday, Disp: , Rfl:   Physical exam:  Vitals:   12/09/20 1032  BP: 130/78  Pulse: 85  Resp: 18  Temp: 98.1 F (36.7 C)  TempSrc: Tympanic  SpO2: 98%  Weight: (!) 304 lb 9.6 oz (138.2 kg)   Physical Exam Eyes:     Extraocular Movements: EOM normal.  Cardiovascular:     Rate and Rhythm: Normal rate and regular rhythm.     Heart sounds: Normal heart sounds.  Pulmonary:     Effort: Pulmonary effort is normal.     Breath sounds: Normal breath sounds.  Abdominal:     General: Bowel sounds are normal.     Palpations: Abdomen is soft.  Musculoskeletal:     Cervical back: Normal range of motion.     Comments: Bilateral lower extremity edema right greater than left which is chronic but increased since prior  Skin:    General: Skin is warm and dry.  Neurological:     Mental Status: He is alert and oriented to person, place, and time.      CMP Latest Ref Rng & Units  12/09/2020  Glucose 70 - 99 mg/dL 98  BUN 8 - 23 mg/dL 24(H)  Creatinine 0.61 - 1.24 mg/dL 1.11  Sodium 135 - 145 mmol/L 137  Potassium 3.5 -  5.1 mmol/L 4.1  Chloride 98 - 111 mmol/L 103  CO2 22 - 32 mmol/L 24  Calcium 8.9 - 10.3 mg/dL 9.1  Total Protein 6.5 - 8.1 g/dL 6.5  Total Bilirubin 0.3 - 1.2 mg/dL 0.9  Alkaline Phos 38 - 126 U/L 75  AST 15 - 41 U/L 21  ALT 0 - 44 U/L 11   CBC Latest Ref Rng & Units 12/09/2020  WBC 4.0 - 10.5 K/uL 6.6  Hemoglobin 13.0 - 17.0 g/dL 9.6(L)  Hematocrit 39.0 - 52.0 % 30.0(L)  Platelets 150 - 400 K/uL 162      Assessment and plan- Patient is a 74 y.o. 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   He is here for on treatment assessment prior to cycle 5 of cabazitaxel  Counts okay to proceed with cycle 5 of cabazitaxel with on pro-Neulasta support.  So far he is tolerating chemotherapy well without any significant cytopenias and peripheral neuropathy which is essentially remained stable.  PSA decreased from 1 42-40 after cycle 3 but since then has remained at that value.  PSA from today is pending  Chemo-induced peripheral neuropathy: Getting gradually worse on cabazitaxel.  He is already receiving a lower dose of 15 mg meter square.  Of asked him to increase his gabapentin to 600 mg at night and 300 mg twice daily during the day  Chemo-induced anemia and thrombocytopenia: Mild and stable.  Continue to monitor  Bilateral lower extremity swelling: Advised him to increase his Lasix to 40 mg daily over the next 3 to 4 days.  Patient's weight is up to 304 Pounds from his baseline of 299 pounds on examination is suggestive of fluid overload.  Patient is on Coumadin for A. fib and the likelihood of a new DVT at this time is low Patient will call us if his leg swelling gets progressively worse  Xgeva today for bone metastases   Visit Diagnosis 1. Malignant neoplasm  of prostate ([place])   2. Encounter for antineoplastic chemotherapy   3. Chemotherapy-induced peripheral neuropathy ([place])   4. Antineoplastic chemotherapy induced anemia      Dr. Randa Evens, MD, MPH [facility] at [facility] [phone] 12/09/2020 7:54 AM

## 2020-12-22 ENCOUNTER — Telehealth: Payer: Self-pay | Admitting: Pharmacy Technician

## 2020-12-22 NOTE — Telephone Encounter (Signed)
Oral Oncology Patient Advocate Encounter   Was successful in securing patient an $46,800 grant from Patient [facility]) to provide copayment coverage for Jevtana (cabazitaxel).  This will keep the out of pocket expense at $0.    The billing information is as follows:  Member ID: [id number] Group ID: [id number] RxBin: [id number] Dates of Eligibility: 12/14/20 through 12/13/21  Fund:  [place] [facility] [place] Phone [phone] Fax [phone] 12/22/2020 10:30 AM

## 2020-12-23 DIAGNOSIS — I482 Chronic atrial fibrillation, unspecified: Secondary | ICD-10-CM | POA: Diagnosis not present

## 2020-12-30 ENCOUNTER — Inpatient Hospital Stay: Payer: PPO

## 2020-12-30 ENCOUNTER — Inpatient Hospital Stay: Payer: PPO | Attending: Oncology | Admitting: Oncology

## 2020-12-30 ENCOUNTER — Encounter: Payer: Self-pay | Admitting: Oncology

## 2020-12-30 VITALS — BP 118/74 | HR 66 | Temp 98.4°F | Resp 16 | Ht 72.0 in | Wt 300.0 lb

## 2020-12-30 DIAGNOSIS — G620 Drug-induced polyneuropathy: Secondary | ICD-10-CM | POA: Diagnosis not present

## 2020-12-30 DIAGNOSIS — C772 Secondary and unspecified malignant neoplasm of intra-abdominal lymph nodes: Secondary | ICD-10-CM | POA: Insufficient documentation

## 2020-12-30 DIAGNOSIS — T451X5D Adverse effect of antineoplastic and immunosuppressive drugs, subsequent encounter: Secondary | ICD-10-CM

## 2020-12-30 DIAGNOSIS — Z9079 Acquired absence of other genital organ(s): Secondary | ICD-10-CM | POA: Insufficient documentation

## 2020-12-30 DIAGNOSIS — Z7952 Long term (current) use of systemic steroids: Secondary | ICD-10-CM | POA: Diagnosis not present

## 2020-12-30 DIAGNOSIS — C61 Malignant neoplasm of prostate: Secondary | ICD-10-CM | POA: Insufficient documentation

## 2020-12-30 DIAGNOSIS — Z79899 Other long term (current) drug therapy: Secondary | ICD-10-CM | POA: Insufficient documentation

## 2020-12-30 DIAGNOSIS — Z809 Family history of malignant neoplasm, unspecified: Secondary | ICD-10-CM | POA: Insufficient documentation

## 2020-12-30 DIAGNOSIS — C7951 Secondary malignant neoplasm of bone: Secondary | ICD-10-CM | POA: Insufficient documentation

## 2020-12-30 DIAGNOSIS — Z5189 Encounter for other specified aftercare: Secondary | ICD-10-CM | POA: Diagnosis not present

## 2020-12-30 DIAGNOSIS — Z5111 Encounter for antineoplastic chemotherapy: Secondary | ICD-10-CM | POA: Diagnosis not present

## 2020-12-30 DIAGNOSIS — T451X5A Adverse effect of antineoplastic and immunosuppressive drugs, initial encounter: Secondary | ICD-10-CM

## 2020-12-30 LAB — COMPREHENSIVE METABOLIC PANEL
ALT: 9 U/L (ref 0–44)
AST: 20 U/L (ref 15–41)
Albumin: 3.9 g/dL (ref 3.5–5.0)
Alkaline Phosphatase: 76 U/L (ref 38–126)
Anion gap: 10 (ref 5–15)
BUN: 23 mg/dL (ref 8–23)
CO2: 23 mmol/L (ref 22–32)
Calcium: 9 mg/dL (ref 8.9–10.3)
Chloride: 103 mmol/L (ref 98–111)
Creatinine, Ser: 1.14 mg/dL (ref 0.61–1.24)
GFR, Estimated: >60 mL/min (ref >60)
Glucose, Bld: 100 mg/dL — ABNORMAL HIGH (ref 70–99)
Potassium: 3.8 mmol/L (ref 3.5–5.1)
Sodium: 136 mmol/L (ref 135–145)
Total Bilirubin: 0.8 mg/dL (ref 0.3–1.2)
Total Protein: 6.6 g/dL (ref 6.5–8.1)

## 2020-12-30 LAB — CBC WITH DIFFERENTIAL/PLATELET
Abs Immature Granulocytes: 0.02 10*3/uL (ref 0.00–0.07)
Basophils Absolute: 0 10*3/uL (ref 0.0–0.1)
Basophils Relative: 0 %
Eosinophils Absolute: 0.3 10*3/uL (ref 0.0–0.5)
Eosinophils Relative: 4 %
HCT: 30 % — ABNORMAL LOW (ref 39.0–52.0)
Hemoglobin: 9.9 g/dL — ABNORMAL LOW (ref 13.0–17.0)
Immature Granulocytes: 0 %
Lymphocytes Relative: 18 %
Lymphs Abs: 1.3 10*3/uL (ref 0.7–4.0)
MCH: 30.6 pg (ref 26.0–34.0)
MCHC: 33 g/dL (ref 30.0–36.0)
MCV: 92.6 fL (ref 80.0–100.0)
Monocytes Absolute: 0.6 10*3/uL (ref 0.1–1.0)
Monocytes Relative: 8 %
Neutro Abs: 4.7 10*3/uL (ref 1.7–7.7)
Neutrophils Relative %: 70 %
Platelets: 157 10*3/uL (ref 150–400)
RBC: 3.24 MIL/uL — ABNORMAL LOW (ref 4.22–5.81)
RDW: 17.1 % — ABNORMAL HIGH (ref 11.5–15.5)
WBC: 6.9 10*3/uL (ref 4.0–10.5)
nRBC: 0 % (ref 0.0–0.2)

## 2020-12-30 LAB — PSA: Prostatic Specific Antigen: 47.99 ng/mL — ABNORMAL HIGH (ref 0.00–4.00)

## 2020-12-30 MED ORDER — SODIUM CHLORIDE 0.9 % IV SOLN
15.0000 mg/m2 | Freq: Once | INTRAVENOUS | Status: AC
  Administered 2020-12-30: 39 mg via INTRAVENOUS
  Filled 2020-12-30: qty 3.9

## 2020-12-30 MED ORDER — SODIUM CHLORIDE 0.9% FLUSH
10.0000 mL | Freq: Once | INTRAVENOUS | Status: AC
  Administered 2020-12-30: 10 mL via INTRAVENOUS
  Filled 2020-12-30: qty 10

## 2020-12-30 MED ORDER — PEGFILGRASTIM 6 MG/0.6ML ~~LOC~~ PSKT
6.0000 mg | PREFILLED_SYRINGE | Freq: Once | SUBCUTANEOUS | Status: AC
  Administered 2020-12-30: 6 mg via SUBCUTANEOUS
  Filled 2020-12-30: qty 0.6

## 2020-12-30 MED ORDER — FAMOTIDINE IN NACL 20-0.9 MG/50ML-% IV SOLN
20.0000 mg | Freq: Once | INTRAVENOUS | Status: AC
  Administered 2020-12-30: 20 mg via INTRAVENOUS
  Filled 2020-12-30: qty 50

## 2020-12-30 MED ORDER — DIPHENHYDRAMINE HCL 50 MG/ML IJ SOLN
25.0000 mg | Freq: Once | INTRAMUSCULAR | Status: AC
  Administered 2020-12-30: 25 mg via INTRAVENOUS
  Filled 2020-12-30: qty 1

## 2020-12-30 MED ORDER — SODIUM CHLORIDE 0.9 % IV SOLN
Freq: Once | INTRAVENOUS | Status: AC
  Filled 2020-12-30: qty 250

## 2020-12-30 MED ORDER — HEPARIN SOD (PORK) LOCK FLUSH 100 UNIT/ML IV SOLN
INTRAVENOUS | Status: AC
  Filled 2020-12-30: qty 5

## 2020-12-30 MED ORDER — SODIUM CHLORIDE 0.9 % IV SOLN
10.0000 mg | Freq: Once | INTRAVENOUS | Status: AC
  Administered 2020-12-30: 10 mg via INTRAVENOUS
  Filled 2020-12-30: qty 10

## 2020-12-30 MED ORDER — HEPARIN SOD (PORK) LOCK FLUSH 100 UNIT/ML IV SOLN
500.0000 [IU] | Freq: Once | INTRAVENOUS | Status: AC | PRN
  Administered 2020-12-30: 500 [IU]
  Filled 2020-12-30: qty 5

## 2020-12-30 NOTE — Progress Notes (Signed)
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 . He again began to have a rising PSA in 2010 and underwent orchiectomy in 2010.He had biochemical recurrence in 2017 and was treated with IMRT  He underwent CT abdomen and pelvis in May 2018which showed abnormal periaortic and right common iliac lymph nodes. Index aortocaval lymph node 1.4 cm which was new as compared to February 2014 and suspicious for malignancy. Bone scan did not reveal any evidence of bony metastatic disease.PSA went up from 3.9 in January 20 18-7.8 in April 2018. 13.3 in July 2018 when docetaxel was initiated. Doubling time at that time was about 3 months  Docetaxel chemo initiated for CRPC with ln mets and rapid doubling PSA in august 2018.Patient completed 5 cycles of docetaxel on 09/27/2017. Cycle #6 was not given due to progressive fatigue and cytopenias as well as worsening neuropathy. Cycle 5 of docetaxel was dose reduced to 60 mg/m square.Patient never had normalization of his PSA following docetaxel and the lowest PSA after docetaxel was 5.64 in December 2018  In April 2019 patient noted to have rising PSAto 19.02and increase in the size of retroperitoneal LN. Patient therefore started second line zytiga.  Patient has remained on Zytiga since April 2019. The lowest PSA value on Zytiga was 6.1 in August 2020 and  since then there has been a small but steady increase in his PSA.Most recent imaging in November 2020 showed increase in the size of retroperitoneal lymph nodes from 0.7 cm to 1.1 cm. New periaortic lymph node 1.2 cm. Slight progression of sclerotic bone metastases involving the left scapula as well as T4 and T7. Most recent PSA on 03/07/2020 was 23.9.Given the subtle progression Zytiga was not changed at that time.  repat LN biopsy showed adenocarcinoma prostate. NGSPD-L1 1%. PTEN C388C (R130X)  Patient went to [facility] for second opinion and was recommended Provenge given the rise in PSA and mild radiological progression.Patient had disease progression on Provenge and switch to third line cabazitaxel   Interval history-neuropathy is presently stable with gabapentin but patient reports some daytime somnolence with it.  Denies other complaints at this time  ECOG PS- 1 Pain scale- 0 Opioid associated constipation- no  Review of systems- Review of Systems  Constitutional: Negative for chills, fever, malaise/fatigue and weight loss.  HENT: Negative for congestion, ear discharge and nosebleeds.   Eyes: Negative for blurred vision.  Respiratory: Negative for cough, hemoptysis, sputum production, shortness of breath and wheezing.   Cardiovascular: Negative for chest pain, palpitations, orthopnea and claudication.  Gastrointestinal: Negative for abdominal pain, blood in stool, constipation, diarrhea, heartburn, melena, nausea and vomiting.  Genitourinary: Negative for dysuria, flank pain, frequency, hematuria and urgency.  Musculoskeletal: Negative for back pain, joint pain and myalgias.  Skin: Negative for rash.  Neurological: Positive for sensory change (Peripheral neuropathy). Negative for dizziness, tingling, focal weakness, seizures, weakness and headaches.  Endo/Heme/Allergies: Does not bruise/bleed easily.  Psychiatric/Behavioral: Negative for depression and suicidal ideas.  The  patient does not have insomnia.      Allergies  Allergen Reactions  . Latex Rash    Sensitivity, not allergy.     Past Medical History:  Diagnosis Date  . Arthritis, degenerative 10/30/2015  . Atrial fibrillation ([place]) 10/30/2015  . Bladder spasm 06/12/2013  . Carcinoma of prostate ([place]) 10/30/2015  . Cardiomyopathy ([place]) 09/10/2014   Overview:  EF 35%,4/15   . Cerebrovascular accident (CVA) ([place]) 10/30/2015   Overview:  Left embolic CBJ,6283   . Difficult or painful urination 11/21/2013  . Dysrhythmia   . Gout 10/30/2015  . History of kidney stones   . Malignant neoplasm of prostate ([place]) 09/05/2012  . Sinoatrial node dysfunction (HCC) 10/30/2015   Overview:  DDD pacemaker, 1996      Past Surgical History:  Procedure Laterality Date  . castration  10/2009  . CATARACT EXTRACTION W/ INTRAOCULAR LENS  IMPLANT, BILATERAL Bilateral 2016   right eye done then the left one done 3 months later in 2016  . COLONOSCOPY WITH PROPOFOL N/A 11/21/2019   Procedure: COLONOSCOPY WITH PROPOFOL;  Surgeon: Robert Bellow, MD;  Location: [facility] ENDOSCOPY;  Service: Endoscopy;  Laterality: N/A;  . INGUINAL HERNIA REPAIR Left   . INSERT / REPLACE / REMOVE PACEMAKER    . IR FLUORO GUIDE PORT INSERTION RIGHT  07/01/2017  . IR REMOVAL TUN ACCESS W/ PORT W/O FL MOD SED  06/20/2020  . IR REMOVAL TUN CV CATH W/O FL  08/01/2020  . JOINT REPLACEMENT Bilateral 1980   hips  . left knee replacement  2014  . PACEMAKER INSERTION    . PORTA CATH INSERTION N/A 09/11/2020   Procedure: PORTA CATH INSERTION;  Surgeon: Algernon Huxley, MD;  Location: [place] CV LAB;  Service: Cardiovascular;  Laterality: N/A;  . PROSTATECTOMY    . REPLACEMENT TOTAL HIP W/  RESURFACING IMPLANTS  1997   bilateral-   . REPLACEMENT TOTAL KNEE Bilateral   . testes removal     per pt   . TONSILLECTOMY AND ADENOIDECTOMY    . TRANSURETHRAL RESECTION OF BLADDER TUMOR N/A 12/31/2015   Procedure: BLADDER BIOPSY;  Surgeon: Nickie Retort, MD;  Location: [facility] ORS;  Service: Urology;  Laterality: N/A;  . Urinary incontinence valve     AMS    Social History   Socioeconomic History  . Marital status: Married    Spouse name: Not on file  . Number of children: Not on file  . Years of education: Not on file  . Highest education level: Not on file  Occupational History  . Not on file  Tobacco Use  . Smoking status: Never Smoker  . Smokeless tobacco: Never Used  Vaping Use  . Vaping Use: Never used  Substance and Sexual Activity  . Alcohol use: No    Alcohol/week: 0.0 standard drinks  . Drug use: No  . Sexual activity: Never  Other Topics Concern  . Not on file  Social History Narrative   Lives at home with spouse   Social Determinants of Health   Financial Resource Strain: Not on file  Food Insecurity: Not on file  Transportation Needs: Not on file  Physical Activity: Not on file  Stress: Not on file  Social Connections: Not on file  Intimate Partner Violence: Not on file    Family History  Problem Relation Age of Onset  . Cancer Mother   . Arthritis Mother   . Heart attack Father   . Arthritis Father   .  Transient ischemic attack Brother   . Prostate cancer Neg Hx   . Bladder Cancer Neg Hx      Current Outpatient Medications:  .  acetaminophen (TYLENOL) 325 MG tablet, Take 650 mg by mouth 2 (two) times daily., Disp: , Rfl:  .  allopurinol (ZYLOPRIM) 300 MG tablet, Take by mouth daily. In am., Disp: , Rfl:  .  bisacodyl (DULCOLAX) 5 MG EC tablet, Take 5 mg by mouth daily as needed. , Disp: , Rfl:  .  calcium carbonate (OS-CAL) 600 MG TABS tablet, Take 600 mg by mouth 2 (two) times daily with a meal., Disp: , Rfl:  .  cyanocobalamin (,VITAMIN B-12,) 1000 MCG/ML injection, Inject 1,000 mcg into the muscle every 30 (thirty) days. , Disp: , Rfl:  .  diltiazem (CARDIZEM CD) 180 MG 24 hr capsule, Take 180 mg by mouth daily., Disp: , Rfl:  .  furosemide (LASIX) 20 MG tablet, Take 20 mg by mouth  daily., Disp: , Rfl:  .  gabapentin (NEURONTIN) 300 MG capsule, Take 1 capsule (300 mg total) by mouth 3 (three) times daily. To start-Take 1 capsule at bedtime for 3 days, increase to 1 capsule twice daily,after 3 days increased to 1 capsule three times daily., Disp: 90 capsule, Rfl: 3 .  lidocaine-prilocaine (EMLA) cream, Apply to affected area once, Disp: 30 g, Rfl: 3 .  phentermine 15 MG capsule, Take 15 mg by mouth every morning., Disp: , Rfl:  .  potassium chloride (KLOR-CON) 10 MEQ tablet, Take 2 tablets (20 mEq total) by mouth daily., Disp: 60 tablet, Rfl: 2 .  simvastatin (ZOCOR) 40 MG tablet, Take 40 mg by mouth daily at 6 PM. , Disp: , Rfl:  .  traMADol (ULTRAM) 50 MG tablet, Take 50 mg by mouth 2 (two) times daily as needed., Disp: , Rfl:  .  warfarin (COUMADIN) 3 MG tablet, Take 3 mg by mouth daily. Takes on Saturday and sunday, Disp: , Rfl:  .  warfarin (COUMADIN) 6 MG tablet, Take 1 tablet by mouth. Monday through  friday, Disp: , Rfl:  No current facility-administered medications for this visit.  Facility-Administered Medications Ordered in Other Visits:  .  sodium chloride flush (NS) 0.9 % injection 10 mL, 10 mL, Intravenous, Once, Sindy Guadeloupe, MD  Physical exam: There were no vitals filed for this visit. Physical Exam Eyes:     Extraocular Movements: EOM normal.  Cardiovascular:     Rate and Rhythm: Normal rate and regular rhythm.     Heart sounds: Normal heart sounds.  Pulmonary:     Effort: Pulmonary effort is normal.     Breath sounds: Normal breath sounds.  Abdominal:     General: Bowel sounds are normal.     Palpations: Abdomen is soft.  Skin:    General: Skin is warm and dry.  Neurological:     Mental Status: He is alert and oriented to person, place, and time.      CMP Latest Ref Rng & Units 12/09/2020  Glucose 70 - 99 mg/dL 98  BUN 8 - 23 mg/dL 24(H)  Creatinine 0.61 - 1.24 mg/dL 1.11  Sodium 135 - 145 mmol/L 137  Potassium 3.5 - 5.1 mmol/L 4.1   Chloride 98 - 111 mmol/L 103  CO2 22 - 32 mmol/L 24  Calcium 8.9 - 10.3 mg/dL 9.1  Total Protein 6.5 - 8.1 g/dL 6.5  Total Bilirubin 0.3 - 1.2 mg/dL 0.9  Alkaline Phos 38 - 126 U/L 75  AST  15 - 41 U/L 21  ALT 0 - 44 U/L 11   CBC Latest Ref Rng & Units 12/09/2020  WBC 4.0 - 10.5 K/uL 6.6  Hemoglobin 13.0 - 17.0 g/dL 9.6(L)  Hematocrit 39.0 - 52.0 % 30.0(L)  Platelets 150 - 400 K/uL 162     Assessment and plan- Patient is a 75 y.o. 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 He is here for on treatment assessment prior to cycle 6 of cabazitaxel  Counts okay to proceed with cycle 6 of cabazitaxel today with on for Neulasta support.  This will be his last chemotherapy for now and I will plan to get repeat CT chest abdomen pelvis with contrast as well as a bone scan following this cycle.  PSA 3 months ago was 142 which decreased to 41 and since then it has been fluctuating around this range.  Last value was 44  NGS testing showed no other actionable mutations.  If he has progression with docetaxel I will consider a second opinion at [facility] versus switching to [facility] induced peripheral neuropathy: Overall stable continue gabapentin.  I have asked him to increase the dose of gabapentin to 600 mg at night and cut down the morning dose to see if that would improve his somnolence   Visit Diagnosis 1. Encounter for antineoplastic chemotherapy   2. Chemotherapy-induced peripheral neuropathy ([place])   3. Malignant neoplasm of prostate [facility])      Dr. Randa Evens, MD, MPH [facility] at [facility] [phone] 12/30/2020 10:06 AM

## 2020-12-30 NOTE — Progress Notes (Signed)
Stable at discharge 

## 2020-12-30 NOTE — Progress Notes (Signed)
Only concern today is that he stays sleepy all the time from gabapentin. I suggested that he take 2 tab at night and see if that makes the sleepiness better. He then says that he hurts worse with gabapentin makes it worse than better for neuropathy

## 2021-01-06 DIAGNOSIS — R0602 Shortness of breath: Secondary | ICD-10-CM | POA: Diagnosis not present

## 2021-01-06 DIAGNOSIS — I517 Cardiomegaly: Secondary | ICD-10-CM | POA: Diagnosis not present

## 2021-01-06 DIAGNOSIS — J9811 Atelectasis: Secondary | ICD-10-CM | POA: Diagnosis not present

## 2021-01-06 DIAGNOSIS — I482 Chronic atrial fibrillation, unspecified: Secondary | ICD-10-CM | POA: Diagnosis not present

## 2021-01-06 DIAGNOSIS — C61 Malignant neoplasm of prostate: Secondary | ICD-10-CM | POA: Diagnosis not present

## 2021-01-09 ENCOUNTER — Telehealth: Payer: Self-pay | Admitting: *Deleted

## 2021-01-09 MED ORDER — GABAPENTIN 300 MG PO CAPS: 300.0000 mg | ORAL_CAPSULE | Freq: Three times a day (TID) | ORAL | 3 refills | Status: AC

## 2021-01-09 NOTE — Telephone Encounter (Signed)
Pt wants a refill of gabapentin. It has been sent to his pharmacy.

## 2021-01-20 ENCOUNTER — Ambulatory Visit: Payer: PPO

## 2021-01-20 DIAGNOSIS — I480 Paroxysmal atrial fibrillation: Secondary | ICD-10-CM | POA: Diagnosis not present

## 2021-01-20 DIAGNOSIS — I420 Dilated cardiomyopathy: Secondary | ICD-10-CM | POA: Diagnosis not present

## 2021-01-20 DIAGNOSIS — I495 Sick sinus syndrome: Secondary | ICD-10-CM | POA: Diagnosis not present

## 2021-01-20 DIAGNOSIS — I6300 Cerebral infarction due to thrombosis of unspecified precerebral artery: Secondary | ICD-10-CM | POA: Diagnosis not present

## 2021-01-20 DIAGNOSIS — E782 Mixed hyperlipidemia: Secondary | ICD-10-CM | POA: Diagnosis not present

## 2021-01-20 DIAGNOSIS — D696 Thrombocytopenia, unspecified: Secondary | ICD-10-CM | POA: Diagnosis not present

## 2021-01-20 DIAGNOSIS — I482 Chronic atrial fibrillation, unspecified: Secondary | ICD-10-CM | POA: Diagnosis not present

## 2021-01-23 ENCOUNTER — Ambulatory Visit: Payer: PPO | Admitting: Oncology

## 2021-01-27 DIAGNOSIS — I495 Sick sinus syndrome: Secondary | ICD-10-CM | POA: Diagnosis not present

## 2021-01-28 ENCOUNTER — Encounter
Admission: RE | Admit: 2021-01-28 | Discharge: 2021-01-28 | Disposition: A | Discharge status: Home / Self Care | Payer: PPO | Source: Ambulatory Visit | Attending: Oncology | Admitting: Oncology

## 2021-01-28 ENCOUNTER — Other Ambulatory Visit: Payer: Self-pay

## 2021-01-28 ENCOUNTER — Ambulatory Visit
Admission: RE | Admit: 2021-01-28 | Discharge: 2021-01-28 | Disposition: A | Discharge status: Home / Self Care | Payer: PPO | Source: Ambulatory Visit | Attending: Oncology | Admitting: Oncology

## 2021-01-28 DIAGNOSIS — C61 Malignant neoplasm of prostate: Secondary | ICD-10-CM | POA: Insufficient documentation

## 2021-01-28 DIAGNOSIS — S22089A Unspecified fracture of T11-T12 vertebra, initial encounter for closed fracture: Secondary | ICD-10-CM | POA: Diagnosis not present

## 2021-01-28 DIAGNOSIS — S22080A Wedge compression fracture of T11-T12 vertebra, initial encounter for closed fracture: Secondary | ICD-10-CM | POA: Diagnosis not present

## 2021-01-28 DIAGNOSIS — Z5111 Encounter for antineoplastic chemotherapy: Secondary | ICD-10-CM | POA: Diagnosis not present

## 2021-01-28 DIAGNOSIS — J439 Emphysema, unspecified: Secondary | ICD-10-CM | POA: Diagnosis not present

## 2021-01-28 DIAGNOSIS — C7951 Secondary malignant neoplasm of bone: Secondary | ICD-10-CM | POA: Diagnosis not present

## 2021-01-28 MED ORDER — TECHNETIUM TC 99M MEDRONATE IV KIT
20.0000 | PACK | Freq: Once | INTRAVENOUS | Status: AC | PRN
  Administered 2021-01-28: 22.02 via INTRAVENOUS

## 2021-01-28 MED ORDER — IOHEXOL 300 MG/ML  SOLN
125.0000 mL | Freq: Once | INTRAMUSCULAR | Status: AC | PRN
  Administered 2021-01-28: 125 mL via INTRAVENOUS

## 2021-01-30 ENCOUNTER — Other Ambulatory Visit: Payer: Self-pay

## 2021-01-30 ENCOUNTER — Other Ambulatory Visit: Payer: Self-pay | Admitting: Oncology

## 2021-01-30 ENCOUNTER — Encounter: Payer: Self-pay | Admitting: Oncology

## 2021-01-30 ENCOUNTER — Inpatient Hospital Stay: Payer: PPO

## 2021-01-30 ENCOUNTER — Inpatient Hospital Stay: Payer: PPO | Attending: Oncology | Admitting: Oncology

## 2021-01-30 VITALS — BP 118/78 | HR 79 | Temp 98.4°F | Wt 286.1 lb

## 2021-01-30 DIAGNOSIS — C61 Malignant neoplasm of prostate: Secondary | ICD-10-CM | POA: Diagnosis not present

## 2021-01-30 DIAGNOSIS — C7951 Secondary malignant neoplasm of bone: Secondary | ICD-10-CM

## 2021-01-30 DIAGNOSIS — Z7189 Other specified counseling: Secondary | ICD-10-CM | POA: Diagnosis not present

## 2021-01-30 DIAGNOSIS — T451X5A Adverse effect of antineoplastic and immunosuppressive drugs, initial encounter: Secondary | ICD-10-CM | POA: Insufficient documentation

## 2021-01-30 DIAGNOSIS — G620 Drug-induced polyneuropathy: Secondary | ICD-10-CM | POA: Insufficient documentation

## 2021-01-30 DIAGNOSIS — Z9079 Acquired absence of other genital organ(s): Secondary | ICD-10-CM | POA: Diagnosis not present

## 2021-01-30 DIAGNOSIS — C772 Secondary and unspecified malignant neoplasm of intra-abdominal lymph nodes: Secondary | ICD-10-CM | POA: Insufficient documentation

## 2021-01-30 DIAGNOSIS — T451X5D Adverse effect of antineoplastic and immunosuppressive drugs, subsequent encounter: Secondary | ICD-10-CM | POA: Diagnosis not present

## 2021-01-30 LAB — CBC WITH DIFFERENTIAL/PLATELET
Abs Immature Granulocytes: 0.02 10*3/uL (ref 0.00–0.07)
Basophils Absolute: 0 10*3/uL (ref 0.0–0.1)
Basophils Relative: 0 %
Eosinophils Absolute: 0.2 10*3/uL (ref 0.0–0.5)
Eosinophils Relative: 3 %
HCT: 26.7 % — ABNORMAL LOW (ref 39.0–52.0)
Hemoglobin: 8.6 g/dL — ABNORMAL LOW (ref 13.0–17.0)
Immature Granulocytes: 0 %
Lymphocytes Relative: 26 %
Lymphs Abs: 1.5 10*3/uL (ref 0.7–4.0)
MCH: 29.7 pg (ref 26.0–34.0)
MCHC: 32.2 g/dL (ref 30.0–36.0)
MCV: 92.1 fL (ref 80.0–100.0)
Monocytes Absolute: 0.6 10*3/uL (ref 0.1–1.0)
Monocytes Relative: 10 %
Neutro Abs: 3.5 10*3/uL (ref 1.7–7.7)
Neutrophils Relative %: 61 %
Platelets: 172 10*3/uL (ref 150–400)
RBC: 2.9 MIL/uL — ABNORMAL LOW (ref 4.22–5.81)
RDW: 16.5 % — ABNORMAL HIGH (ref 11.5–15.5)
WBC: 5.7 10*3/uL (ref 4.0–10.5)
nRBC: 0 % (ref 0.0–0.2)

## 2021-01-30 LAB — COMPREHENSIVE METABOLIC PANEL
ALT: 10 U/L (ref 0–44)
AST: 23 U/L (ref 15–41)
Albumin: 3.9 g/dL (ref 3.5–5.0)
Alkaline Phosphatase: 71 U/L (ref 38–126)
Anion gap: 13 (ref 5–15)
BUN: 21 mg/dL (ref 8–23)
CO2: 25 mmol/L (ref 22–32)
Calcium: 9.1 mg/dL (ref 8.9–10.3)
Chloride: 99 mmol/L (ref 98–111)
Creatinine, Ser: 1.16 mg/dL (ref 0.61–1.24)
GFR, Estimated: >60 mL/min (ref >60)
Glucose, Bld: 99 mg/dL (ref 70–99)
Potassium: 3.6 mmol/L (ref 3.5–5.1)
Sodium: 137 mmol/L (ref 135–145)
Total Bilirubin: 1.2 mg/dL (ref 0.3–1.2)
Total Protein: 6.7 g/dL (ref 6.5–8.1)

## 2021-01-30 NOTE — Progress Notes (Signed)
Patient feels a decline in his stamina.  Has a weight loss of 14 lbs in the last month despite having a good appetite.

## 2021-01-30 NOTE — Progress Notes (Signed)
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 . He again began to have a rising PSA in 2010 and underwent orchiectomy in 2010.He had biochemical recurrence in 2017 and was treated with IMRT  He underwent CT abdomen and pelvis in May 2018which showed abnormal periaortic and right common iliac lymph nodes. Index aortocaval lymph node 1.4 cm which was new as compared to February 2014 and suspicious for malignancy. Bone scan did not reveal any evidence of bony metastatic disease.PSA went up from 3.9 in January 20 18-7.8 in April 2018. 13.3 in July 2018 when docetaxel was initiated. Doubling time at that time was about 3 months  Docetaxel chemo initiated for CRPC with ln mets and rapid doubling PSA in august 2018.Patient completed 5 cycles of docetaxel on 09/27/2017. Cycle #6 was not given due to progressive fatigue and cytopenias as well as worsening neuropathy. Cycle 5 of docetaxel was dose reduced to 60 mg/m square.Patient never had normalization of his PSA following docetaxel and the lowest PSA after docetaxel was 5.64 in December 2018  In April 2019 patient noted to have rising PSAto 19.02and increase in the size of retroperitoneal LN. Patient therefore started second line zytiga.  Patient has remained on Zytiga since April 2019. The lowest PSA value on Zytiga was 6.1 in August 2020 and since then there has  been a small but steady increase in his PSA.Most recent imaging in November 2020 showed increase in the size of retroperitoneal lymph nodes from 0.7 cm to 1.1 cm. New periaortic lymph node 1.2 cm. Slight progression of sclerotic bone metastases involving the left scapula as well as T4 and T7. Most recent PSA on 03/07/2020 was 23.9.Given the subtle progression Zytiga was not changed at that time.  repat LN biopsy showed adenocarcinoma prostate. NGSPD-L1 1%. PTEN C388C (R130X)  Patient went to [facility] for second opinion and was recommended Provenge given the rise in PSA and mild radiological progression.Patient had disease progression on Provenge and switch to third line cabazitaxel   Interval history-reports ongoing fatigue.  Neuropathy is essentially stable.  Denies any worsening pain.  ECOG PS- 1 Pain scale- 0 Opioid associated constipation- no  Review of systems- Review of Systems  Constitutional: Positive for malaise/fatigue. Negative for chills, fever and weight loss.  HENT: Negative for congestion, ear discharge and nosebleeds.   Eyes: Negative for blurred vision.  Respiratory: Negative for cough, hemoptysis, sputum production, shortness of breath and wheezing.   Cardiovascular: Negative for chest pain, palpitations, orthopnea and claudication.  Gastrointestinal: Negative for abdominal pain, blood in stool, constipation, diarrhea, heartburn, melena, nausea and vomiting.  Genitourinary: Negative for dysuria, flank pain, frequency, hematuria and urgency.  Musculoskeletal: Negative for back pain, joint pain and myalgias.  Skin: Negative for rash.  Neurological: Positive for sensory change (Peripheral neuropathy). Negative for dizziness, tingling, focal weakness, seizures, weakness and headaches.  Endo/Heme/Allergies: Does not bruise/bleed easily.  Psychiatric/Behavioral: Negative for depression and suicidal ideas. The patient does not have insomnia.  Allergies  Allergen  Reactions  . Latex Rash    Sensitivity, not allergy.     Past Medical History:  Diagnosis Date  . Arthritis, degenerative 10/30/2015  . Atrial fibrillation ([place]) 10/30/2015  . Bladder spasm 06/12/2013  . Carcinoma of prostate ([place]) 10/30/2015  . Cardiomyopathy ([place]) 09/10/2014   Overview:  EF 35%,4/15   . Cerebrovascular accident (CVA) ([place]) 10/30/2015   Overview:  Left embolic IZT,2458   . Difficult or painful urination 11/21/2013  . Dysrhythmia   . Gout 10/30/2015  . History of kidney stones   . Malignant neoplasm of prostate ([place]) 09/05/2012  . Sinoatrial node dysfunction ([facility]) 10/30/2015   Overview:  DDD pacemaker, 1996      Past Surgical History:  Procedure Laterality Date  . castration  10/2009  . CATARACT EXTRACTION W/ INTRAOCULAR LENS  IMPLANT, BILATERAL Bilateral 2016   right eye done then the left one done 3 months later in 2016  . COLONOSCOPY WITH PROPOFOL N/A 11/21/2019   Procedure: COLONOSCOPY WITH PROPOFOL;  Surgeon: Robert Bellow, MD;  Location: [facility] ENDOSCOPY;  Service: Endoscopy;  Laterality: N/A;  . INGUINAL HERNIA REPAIR Left   . INSERT / REPLACE / REMOVE PACEMAKER    . IR FLUORO GUIDE PORT INSERTION RIGHT  07/01/2017  . IR REMOVAL TUN ACCESS W/ PORT W/O FL MOD SED  06/20/2020  . IR REMOVAL TUN CV CATH W/O FL  08/01/2020  . JOINT REPLACEMENT Bilateral 1980   hips  . left knee replacement  2014  . PACEMAKER INSERTION    . PORTA CATH INSERTION N/A 09/11/2020   Procedure: PORTA CATH INSERTION;  Surgeon: Algernon Huxley, MD;  Location: [place] [facility];  Service: Cardiovascular;  Laterality: N/A;  . PROSTATECTOMY    . REPLACEMENT TOTAL HIP W/  RESURFACING IMPLANTS  1997   bilateral-   . REPLACEMENT TOTAL KNEE Bilateral   . testes removal     per pt   . TONSILLECTOMY AND ADENOIDECTOMY    . TRANSURETHRAL RESECTION OF BLADDER TUMOR N/A 12/31/2015   Procedure: BLADDER BIOPSY;  Surgeon: Nickie Retort, MD;  Location: [facility] ORS;  Service: Urology;   Laterality: N/A;  . Urinary incontinence valve     AMS    Social History   Socioeconomic History  . Marital status: Married    Spouse name: Not on file  . Number of children: Not on file  . Years of education: Not on file  . Highest education level: Not on file  Occupational History  . Not on file  Tobacco Use  . Smoking status: Never Smoker  . Smokeless tobacco: Never Used  Vaping Use  . Vaping Use: Never used  Substance and Sexual Activity  . Alcohol use: No    Alcohol/week: 0.0 standard drinks  . Drug use: No  . Sexual activity: Never  Other Topics Concern  . Not on file  Social History Narrative   Lives at home with spouse   Social Determinants of Health   Financial Resource Strain: Not on file  Food Insecurity: Not on file  Transportation Needs: Not on file  Physical Activity: Not on file  Stress: Not on file  Social Connections: Not on file  Intimate Partner Violence: Not on file    Family History  Problem Relation Age of Onset  . Cancer Mother   . Arthritis Mother   . Heart attack Father   . Arthritis Father   . Transient ischemic attack Brother   . Prostate  cancer Neg Hx   . Bladder Cancer Neg Hx      Current Outpatient Medications:  .  acetaminophen (TYLENOL) 325 MG tablet, Take 650 mg by mouth 2 (two) times daily., Disp: , Rfl:  .  allopurinol (ZYLOPRIM) 300 MG tablet, Take by mouth daily. In am., Disp: , Rfl:  .  bisacodyl (DULCOLAX) 5 MG EC tablet, Take 5 mg by mouth daily as needed. , Disp: , Rfl:  .  calcium carbonate (OS-CAL) 600 MG TABS tablet, Take 600 mg by mouth 2 (two) times daily with a meal., Disp: , Rfl:  .  cyanocobalamin (,VITAMIN B-12,) 1000 MCG/ML injection, Inject 1,000 mcg into the muscle every 30 (thirty) days. , Disp: , Rfl:  .  diltiazem (CARDIZEM CD) 180 MG 24 hr capsule, Take 180 mg by mouth daily., Disp: , Rfl:  .  furosemide (LASIX) 20 MG tablet, Take 20 mg by mouth 2 (two) times daily., Disp: , Rfl:  .  gabapentin  (NEURONTIN) 300 MG capsule, Take 1 capsule (300 mg total) by mouth 3 (three) times daily., Disp: 90 capsule, Rfl: 3 .  lidocaine-prilocaine (EMLA) cream, Apply to affected area once, Disp: 30 g, Rfl: 3 .  potassium chloride (KLOR-CON) 10 MEQ tablet, Take 2 tablets (20 mEq total) by mouth daily., Disp: 60 tablet, Rfl: 2 .  simvastatin (ZOCOR) 40 MG tablet, Take 40 mg by mouth daily at 6 PM. , Disp: , Rfl:  .  warfarin (COUMADIN) 3 MG tablet, Take 3 mg by mouth daily. Takes on  Friday Saturday and sunday, Disp: , Rfl:  .  warfarin (COUMADIN) 6 MG tablet, Take 1 tablet by mouth. Monday through  fhursday, Disp: , Rfl:  .  phentermine 15 MG capsule, Take 15 mg by mouth every morning. (Patient not taking: No sig reported), Disp: , Rfl:  .  traMADol (ULTRAM) 50 MG tablet, Take 50 mg by mouth 2 (two) times daily as needed. (Patient not taking: Reported on 01/30/2021), Disp: , Rfl:   Physical exam:  Vitals:   01/30/21 1007  BP: 118/78  Pulse: 79  Temp: 98.4 F (36.9 C)  TempSrc: Tympanic  SpO2: 100%  Weight: 286 lb 1.6 oz (129.8 kg)   Physical Exam Constitutional:      General: He is not in acute distress. Eyes:     Extraocular Movements: EOM normal.  Cardiovascular:     Rate and Rhythm: Normal rate. Rhythm irregular.     Heart sounds: Normal heart sounds.  Pulmonary:     Effort: Pulmonary effort is normal.     Breath sounds: Normal breath sounds.  Skin:    General: Skin is warm and dry.  Neurological:     Mental Status: He is alert and oriented to person, place, and time.      CMP Latest Ref Rng & Units 12/30/2020  Glucose 70 - 99 mg/dL 100(H)  BUN 8 - 23 mg/dL 23  Creatinine 0.61 - 1.24 mg/dL 1.14  Sodium 135 - 145 mmol/L 136  Potassium 3.5 - 5.1 mmol/L 3.8  Chloride 98 - 111 mmol/L 103  CO2 22 - 32 mmol/L 23  Calcium 8.9 - 10.3 mg/dL 9.0  Total Protein 6.5 - 8.1 g/dL 6.6  Total Bilirubin 0.3 - 1.2 mg/dL 0.8  Alkaline Phos 38 - 126 U/L 76  AST 15 - 41 U/L 20  ALT 0 - 44 U/L  9   CBC Latest Ref Rng & Units 12/30/2020  WBC 4.0 - 10.5 K/uL 6.9  Hemoglobin  13.0 - 17.0 g/dL 9.9(L)  Hematocrit 39.0 - 52.0 % 30.0(L)  Platelets 150 - 400 K/uL 157    No images are attached to the encounter.  NM Bone Scan Whole Body  Result Date: 01/28/2021 CLINICAL DATA:  Metastatic prostate cancer. EXAM: NUCLEAR MEDICINE WHOLE BODY BONE SCAN TECHNIQUE: Whole body anterior and posterior images were obtained approximately 3 hours after intravenous injection of radiopharmaceutical. RADIOPHARMACEUTICALS:  22 mCi Technetium-25mMDP IV COMPARISON:  CT dated 01/28/2021.  Bone scan dated 09/04/2020 FINDINGS: Again identified is radiotracer uptake within the left inferior scapula, likely progressed since the prior bone scan. There is unchanged uptake within 1 of the anterior ribs at the level of the costochondral junction. Several foci of increased uptake are noted in the patient's thoracolumbar spine with a relatively similar distribution when compared to the prior bone scan. However, there does appear to be increasing radiotracer uptake in the lower lumbar spine anteriorly on the right. Again noted is extensive radiotracer uptake in the bilateral shoulders, left greater than right. This is similar to prior study. The previously demonstrated foci of uptake within the pelvis are less well appreciated on today's study. Again noted is a faint focus of uptake within the left parietal bone which is similar to prior study. There is new accumulation of radiotracer within the right renal collecting system. IMPRESSION: 1. New accumulation of radiotracer within the right renal collecting system suggestive of right-sided hydronephrosis. Please see separate CT from the same day. 2. Similar distribution of radiotracer throughout the visualized skeletal structures with likely some progression within the inferior left scapula Electronically Signed   By: CConstance HolsterM.D.   On: 01/28/2021 20:13   CT CHEST ABDOMEN  PELVIS W CONTRAST  Result Date: 01/28/2021 CLINICAL DATA:  Restaging prostate cancer. EXAM: CT CHEST, ABDOMEN, AND PELVIS WITH CONTRAST TECHNIQUE: Multidetector CT imaging of the chest, abdomen and pelvis was performed following the standard protocol during bolus administration of intravenous contrast. CONTRAST:  1223mOMNIPAQUE IOHEXOL 300 MG/ML  SOLN COMPARISON:  CT scan 09/04/2020 FINDINGS: CT CHEST FINDINGS Cardiovascular: The heart is upper limits of normal in size and stable. No pericardial effusion. Stable mild tortuosity of the thoracic aorta but no aneurysm or dissection. Scattered atherosclerotic calcifications. Three-vessel coronary artery calcifications are noted. The pacer wires are stable. Mediastinum/Nodes: Interval decrease in size of the mediastinal and hilar lymph nodes. Right paratracheal lymph node on image 20/2 measures 9.5 mm and previously measured 11.5 mm. Partially calcified AP window node measures 5.5 mm and previously measured 12 mm. Subcarinal nodal mass measures a maximum of 13 mm and previously measured 30 mm. The esophagus is grossly normal. Lungs/Pleura: Stable emphysematous changes and pulmonary scarring. No worrisome pulmonary nodules to suggest metastatic disease. Musculoskeletal: Bilateral symmetric gynecomastia. No supraclavicular or axillary adenopathy. The thyroid gland is unremarkable. Stable mild compression fracture of T3 and stable sclerotic T4 bone lesion. Stable T12 compression fracture. No new bony findings. Stable severe degenerative changes involving both shoulders. CT ABDOMEN PELVIS FINDINGS Hepatobiliary: No hepatic lesions to suggest metastatic disease. Gallbladder is unremarkable. No common bile duct dilatation. Pancreas: Moderate stable pancreatic atrophy but no pancreatic mass or acute inflammation. Spleen: No splenic lesions. Adrenals/Urinary Tract: The adrenal glands are unremarkable. 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 No obstructing calculus is identified. No obvious enhancing soft tissue lesion. Stable parapelvic left renal cyst. Stomach/Bowel: The stomach, duodenum, small bowel and colon are grossly normal. No acute inflammatory  process or obstructive findings. Vascular/Lymphatic: Advanced vascular calcifications are stable. No aneurysm or dissection. Persistent extensive retroperitoneal lymphadenopathy. The main left-sided retroperitoneal mass measures approximately 6.6 x 5.9 cm on image 65/2. This previously measured 5.9 x 5.8 cm. Bilobed partially calcified right-sided retroperitoneal nodal lesion measures a maximum of 5 cm and previously measured 5 cm. No pelvic adenopathy. Reproductive: Limited visualization due to artifact from bilateral hip prostheses. Prior prostatectomy. No pelvic tumor. Other: Stable cystic area in the right inguinal region possibly a postoperative cyst. Musculoskeletal: Significant artifact related to bilateral hip prostheses. Stable lytic process in the left acetabular and adjacent complex fluid collection likely E postop hematoma. No other bone lesions are identified. IMPRESSION: 1. Interval decrease in size of the mediastinal and hilar lymph nodes. 2. Persistent extensive retroperitoneal lymphadenopathy. 3. 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 4. Stable lytic process in the left acetabular region. 5. Stable emphysematous changes and pulmonary scarring. No worrisome pulmonary nodules to suggest metastatic disease. 6. Stable compression fractures of T3 and T12. Stable sclerotic lesion in T4. Aortic Atherosclerosis (ICD10-I70.0) and Emphysema (ICD10-J43.9). Electronically Signed   By: Marijo Sanes M.D.   On: 01/28/2021 16:39     Assessment and plan- Patient is a 75 y.o. 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  Since then it has been gradually going up from 44 to 47.99 10-monthago.  I have reviewed CT chest abdomen pelvis images as well as bone scan independently and discussed findings with the patient.  Bone metastases currently appear overall stable although the scapular uptake appears more prominent.  Mediastinal and hilar adenopathy was appearing improved Mother has been some progression in the size of the left retroperitoneal mass from 5.9 to 6.6 cm.  At this time I am inclined to observe the patient without switching treatments as there is no significant progression noted on his scans.  CT scan also showed new right-sided hydronephrosis possibly secondary to UPJ obstruction but there was no obvious cause of obstruction noted.  I will check his kidney functions today and refer him to urology for further management  Patient will return next week to receive his Xgeva and I will see him back in 1 month with CBC with differential CMP and PSA for the next dose of Xgeva.  Patient has progression on cabazitaxel only possible treatment option we have for him would be Xtandi.  NGS testing also did not show any actionable mutations  Chemo-induced peripheral neuropathy: Continue gabapentin  Visit Diagnosis 1. Goals of care, counseling/discussion   2. 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 Malignant neoplasm of prostate ([place]   4. Chemotherapy-induced peripheral neuropathy ([place]      Dr. ARanda Evens MD, MPH [id number]/18/2022 10:59 AM

## 2021-02-03 ENCOUNTER — Inpatient Hospital Stay: Payer: PPO

## 2021-02-03 ENCOUNTER — Other Ambulatory Visit: Payer: Self-pay

## 2021-02-03 DIAGNOSIS — C772 Secondary and unspecified malignant neoplasm of intra-abdominal lymph nodes: Secondary | ICD-10-CM | POA: Diagnosis not present

## 2021-02-03 DIAGNOSIS — E860 Dehydration: Secondary | ICD-10-CM

## 2021-02-03 MED ORDER — DENOSUMAB 120 MG/1.7ML ~~LOC~~ SOLN
120.0000 mg | Freq: Once | SUBCUTANEOUS | Status: AC
  Administered 2021-02-03: 120 mg via SUBCUTANEOUS
  Filled 2021-02-03: qty 1.7

## 2021-02-04 ENCOUNTER — Ambulatory Visit: Payer: Self-pay | Admitting: Urology

## 2021-02-09 DIAGNOSIS — I69351 Hemiplegia and hemiparesis following cerebral infarction affecting right dominant side: Secondary | ICD-10-CM | POA: Diagnosis not present

## 2021-02-09 DIAGNOSIS — Z9079 Acquired absence of other genital organ(s): Secondary | ICD-10-CM | POA: Diagnosis not present

## 2021-02-09 DIAGNOSIS — Z7901 Long term (current) use of anticoagulants: Secondary | ICD-10-CM | POA: Diagnosis not present

## 2021-02-09 DIAGNOSIS — C779 Secondary and unspecified malignant neoplasm of lymph node, unspecified: Secondary | ICD-10-CM | POA: Diagnosis not present

## 2021-02-09 DIAGNOSIS — D6869 Other thrombophilia: Secondary | ICD-10-CM | POA: Diagnosis not present

## 2021-02-09 DIAGNOSIS — I739 Peripheral vascular disease, unspecified: Secondary | ICD-10-CM | POA: Diagnosis not present

## 2021-02-09 DIAGNOSIS — G620 Drug-induced polyneuropathy: Secondary | ICD-10-CM | POA: Diagnosis not present

## 2021-02-09 DIAGNOSIS — I4891 Unspecified atrial fibrillation: Secondary | ICD-10-CM | POA: Diagnosis not present

## 2021-02-09 DIAGNOSIS — C61 Malignant neoplasm of prostate: Secondary | ICD-10-CM | POA: Diagnosis not present

## 2021-02-09 DIAGNOSIS — I509 Heart failure, unspecified: Secondary | ICD-10-CM | POA: Diagnosis not present

## 2021-02-09 DIAGNOSIS — T451X5S Adverse effect of antineoplastic and immunosuppressive drugs, sequela: Secondary | ICD-10-CM | POA: Diagnosis not present

## 2021-02-10 ENCOUNTER — Telehealth: Payer: Self-pay | Admitting: *Deleted

## 2021-02-10 NOTE — Telephone Encounter (Signed)
Pt called into office today and left message that pt having back pain and using tylenol and helps some but would like to get something stronger if he can have something? Will call back with a response after speaking to provider

## 2021-02-12 ENCOUNTER — Ambulatory Visit (INDEPENDENT_AMBULATORY_CARE_PROVIDER_SITE_OTHER): Payer: PPO | Admitting: Urology

## 2021-02-12 ENCOUNTER — Other Ambulatory Visit: Payer: Self-pay

## 2021-02-12 ENCOUNTER — Telehealth: Payer: Self-pay | Admitting: Hospice and Palliative Medicine

## 2021-02-12 ENCOUNTER — Other Ambulatory Visit: Payer: Self-pay | Admitting: *Deleted

## 2021-02-12 ENCOUNTER — Encounter: Payer: Self-pay | Admitting: Urology

## 2021-02-12 VITALS — BP 135/85 | HR 68 | Ht 72.0 in | Wt 280.0 lb

## 2021-02-12 DIAGNOSIS — N1330 Unspecified hydronephrosis: Secondary | ICD-10-CM

## 2021-02-12 DIAGNOSIS — C61 Malignant neoplasm of prostate: Secondary | ICD-10-CM

## 2021-02-12 MED ORDER — HYDROCODONE-ACETAMINOPHEN 5-325 MG PO TABS: 1.0000 | ORAL_TABLET | ORAL | 0 refills | Status: DC | PRN

## 2021-02-12 NOTE — Telephone Encounter (Signed)
He did call back and said that he has tramadol. Then I called today and spoke to wife and she said they don't know what is going on with his phone but he is taking tylenol and tramadol and takes it twice  day and then the pain med. Does not last for 12 hours. She thought that if he could have it three times a day it would be better. However when pt got done at a md office urology he called back and said the combination is not working and wanted something else and was given norco. Pt was also told that he should stop tylenol because the norco has tylenol mixed in the med. And to not take tramadol while he is on norco and per [place] who spoke to him he said she would only take norco for pain

## 2021-02-12 NOTE — Telephone Encounter (Signed)
Patient apparently called into triage complaining of worse generalized pain unrelieved with acetaminophen and tramadol.  I tried calling patient several times over the past 2 days without success.  Call goes directly to voicemail. I also tried his wife without success. Voicemail left.

## 2021-02-12 NOTE — Progress Notes (Signed)
02/12/2021 11:31 AM   Jeremy Carney 25-Sep-1946 [id number]  Referring provider: Rusty Aus, MD [facility] [place],  [place] [address]  Chief Complaint  Patient presents with  . Hydronephrosis    HPI: 75 year old male followed by Dr. Deidre Carney medical oncology with castrate resistant metastatic prostate cancer who presents today for further evaluation of right hydronephrosis.  Has a complex urologic history.  He was diagnosed and underwent radical prostatectomy in 1999.  He had biochemical recurrence in 2007 treated with IMRT and then orchiectomy 2010.  His cancer continue to progress and he ultimately developed periaortic and right common iliac lymphadenopathy.  PSA continue to rise.  He was treated with docetaxel in 2018.  He was then started on Zytiga in 2019 on which he progressed.  He went to [facility] for second opinion and was treated with Provenge followed by cabazitaxel.  He is currently on Xgeva for his bony metastatic disease but other treatments are being held as his options are limited.  Most recent imaging in the form of CT scan as well as PET scan on 01/28/2021 show new right-sided hydroureteronephrosis.  No obstructing stones are appreciated.  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  He denies any flank pain.  His creatinine remains relatively stable at 1.16 as of 01/30/2021.  Most recent PSA 47.99 on 12/2020  He does have a personal history of urinary incontinence status post Carney in 2007.  This is functioning well.  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    PMH: Past Medical History:  Diagnosis Date  . Arthritis, degenerative 10/30/2015  . Atrial fibrillation ([place]) 10/30/2015  . Bladder spasm 06/12/2013  . Carcinoma of prostate ([place]) 10/30/2015  . Cardiomyopathy ([place]) 09/10/2014   Overview:  EF  35%,4/15   . Cerebrovascular accident (CVA) ([place]) 10/30/2015   Overview:  Left embolic [id number]   . Difficult or painful urination 11/21/2013  . Dysrhythmia   . Gout 10/30/2015  . History of kidney stones   . Malignant neoplasm of prostate ([place]) 09/05/2012  . Sinoatrial node dysfunction (HCC) 10/30/2015   Overview:  DDD pacemaker, 1996     Surgical History: Past Surgical History:  Procedure Laterality Date  . castration  10/2009  . CATARACT EXTRACTION W/ INTRAOCULAR LENS  IMPLANT, BILATERAL Bilateral 2016   right eye done then the left one done 3 months later in 2016  . COLONOSCOPY WITH PROPOFOL N/A 11/21/2019   Procedure: COLONOSCOPY WITH PROPOFOL;  Surgeon: Jeremy Bellow, MD;  Location: [facility] ENDOSCOPY;  Service: Endoscopy;  Laterality: N/A;  . INGUINAL HERNIA REPAIR Left   . INSERT / REPLACE / REMOVE PACEMAKER    . IR FLUORO GUIDE PORT INSERTION RIGHT  07/01/2017  . IR REMOVAL TUN ACCESS W/ PORT W/O FL MOD SED  06/20/2020  . IR REMOVAL TUN CV CATH W/O FL  08/01/2020  . JOINT REPLACEMENT Bilateral 1980   hips  . left knee replacement  2014  . PACEMAKER INSERTION    . PORTA CATH INSERTION N/A 09/11/2020   Procedure: PORTA CATH INSERTION;  Surgeon: Jeremy Huxley, MD;  Location: [facility];  Service: Cardiovascular;  Laterality: N/A;  . PROSTATECTOMY    . REPLACEMENT TOTAL HIP W/  RESURFACING IMPLANTS  1997   bilateral-   . REPLACEMENT TOTAL KNEE Bilateral   . testes removal  per pt   . TONSILLECTOMY AND ADENOIDECTOMY    . TRANSURETHRAL RESECTION OF BLADDER TUMOR N/A 12/31/2015   Procedure: BLADDER BIOPSY;  Surgeon: Jeremy Retort, MD;  Location: [facility] ORS;  Service: Urology;  Laterality: N/A;  . Urinary incontinence valve     AMS    Home Medications:  Allergies as of 02/12/2021      Reactions   Latex Rash   Sensitivity, not allergy.      Medication List       Accurate as of February 12, 2021 11:59 PM. If you have any questions, ask your nurse or doctor.         STOP taking these medications   phentermine 15 MG capsule Stopped by: Jeremy Espy, MD     TAKE these medications   acetaminophen 325 MG tablet Commonly known as: TYLENOL Take 650 mg by mouth 2 (two) times daily.   allopurinol 300 MG tablet Commonly known as: ZYLOPRIM Take by mouth daily. In am.   bisacodyl 5 MG EC tablet Commonly known as: DULCOLAX Take 5 mg by mouth daily as needed.   calcium carbonate 600 MG Tabs tablet Commonly known as: OS-CAL Take 600 mg by mouth 2 (two) times daily with a meal.   cyanocobalamin 1000 MCG/ML injection Commonly known as: (VITAMIN B-12) Inject 1,000 mcg into the muscle every 30 (thirty) days.   diltiazem 180 MG 24 hr capsule Commonly known as: CARDIZEM CD Take 180 mg by mouth daily.   furosemide 20 MG tablet Commonly known as: LASIX Take 20 mg by mouth 2 (two) times daily.   gabapentin 300 MG capsule Commonly known as: NEURONTIN Take 1 capsule (300 mg total) by mouth 3 (three) times daily.   HYDROcodone-acetaminophen 5-325 MG tablet Commonly known as: Norco Take 1 tablet by mouth every 4 (four) hours as needed for moderate pain. Started by: Jeremy Olive, RN   lidocaine-prilocaine cream Commonly known as: EMLA Apply to affected area once   potassium chloride 10 MEQ tablet Commonly known as: KLOR-CON Take 2 tablets (20 mEq total) by mouth daily.   simvastatin 40 MG tablet Commonly known as: ZOCOR Take 40 mg by mouth daily at 6 PM.   traMADol 50 MG tablet Commonly known as: ULTRAM Take 50 mg by mouth 2 (two) times daily as needed.   warfarin 3 MG tablet Commonly known as: COUMADIN Take 3 mg by mouth daily. Takes on  Friday Saturday and sunday   warfarin 6 MG tablet Commonly known as: COUMADIN Take 1 tablet by mouth. Monday through  fhursday       Allergies:  Allergies  Allergen Reactions  . Latex Rash    Sensitivity, not allergy.    Family History: Family History  Problem Relation Age of Onset   . Cancer Mother   . Arthritis Mother   . Heart attack Father   . Arthritis Father   . Transient ischemic attack Brother   . Prostate cancer Neg Hx   . Bladder Cancer Neg Hx     Social History:  reports that he has never smoked. He has never used smokeless tobacco. He reports that he does not drink alcohol and does not use drugs.   Physical Exam: BP 135/85   Pulse 68   Ht 6' (1.829 m)   Wt 280 lb (127 kg)   BMI 37.97 kg/m   Constitutional:  Alert and oriented, No acute distress. HEENT: [place] AT, moist mucus membranes.  Trachea midline, no masses. Cardiovascular: No clubbing,  cyanosis, or edema. Respiratory: Normal respiratory effort, no increased work of breathing. Skin: No rashes, bruises or suspicious lesions. Neurologic: Grossly intact, no focal deficits, moving all 4 extremities. Psychiatric: Normal mood and affect.  Laboratory Data:  Urinalysis UA today is negative  Pertinent Imaging: CT scan / bone scan 01/28/21 personally reviewed.  This compared to previous imaging from 08/2020 at which time there was no hydronephrosis appreciated.  There is not appear to be any obvious ureteral calculi, abrupt transition within the proximal ureter.   Assessment & Plan:    1. 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 versus external metastatic disease  Limited role for radiation in this setting as this will likely cause worsening ureteral scarring  Currently, he is asymptomatic from this without flank pain and his creatinine is remained relatively stable albeit has not had recent imaging  We will follow up with renal ultrasound as well as repeat labs in about 6 weeks to monitor for progression  We discussed today that if his creatinine continues to worsen, he may require ureteral stent versus percutaneous  nephrostomy tube placement versus expectant management with anticipated worsening renal function and/or loss of renal unit if he does not choose to intervene.  Will decide how to proceed depending on follow-up imaging/labs.  Case discussed with Dr. Janese Carney - Basic metabolic panel; Future - Ultrasound renal complete; Future - Urinalysis, Complete  2. Prostate cancer [facility]) Metastatic castrate resistant with continued progression despite second third and fourth line therapy, managed by Dr. Seabron Spates, MD  [facility] [address] [place], [place] [address] [phone]  I spent 60 total minutes on the day of the encounter including pre-visit review of the medical record, face-to-face time with the patient, and post visit ordering of labs/imaging/tests.  Multiple images were personally reviewed, case is discussed personally with Dr. Janese Carney.  Sense of chart review from previous urologist, [facility] medical oncology as well as [facility] medical oncology.

## 2021-02-13 LAB — URINALYSIS, COMPLETE
Bilirubin, UA: NEGATIVE
Glucose, UA: NEGATIVE
Ketones, UA: NEGATIVE
Leukocytes,UA: NEGATIVE
Nitrite, UA: NEGATIVE
Protein,UA: NEGATIVE
RBC, UA: NEGATIVE
Specific Gravity, UA: 1.015 (ref 1.005–1.030)
Urobilinogen, Ur: 0.2 mg/dL (ref 0.2–1.0)
pH, UA: 6 (ref 5.0–7.5)

## 2021-02-13 LAB — MICROSCOPIC EXAMINATION

## 2021-02-18 DIAGNOSIS — I480 Paroxysmal atrial fibrillation: Secondary | ICD-10-CM | POA: Diagnosis not present

## 2021-02-24 ENCOUNTER — Other Ambulatory Visit: Payer: Self-pay | Admitting: *Deleted

## 2021-02-24 ENCOUNTER — Telehealth: Payer: Self-pay | Admitting: *Deleted

## 2021-02-24 DIAGNOSIS — I480 Paroxysmal atrial fibrillation: Secondary | ICD-10-CM | POA: Diagnosis not present

## 2021-02-24 MED ORDER — HYDROCODONE-ACETAMINOPHEN 5-325 MG PO TABS: 1.0000 | ORAL_TABLET | ORAL | 0 refills | Status: DC | PRN

## 2021-02-24 NOTE — Telephone Encounter (Signed)
Pt says he needs refill, has a few pills left and it is working for his pain. Josh will refill the med for him.

## 2021-02-25 ENCOUNTER — Other Ambulatory Visit: Payer: Self-pay | Admitting: Oncology

## 2021-02-27 ENCOUNTER — Ambulatory Visit: Payer: PPO | Admitting: Oncology

## 2021-02-27 ENCOUNTER — Other Ambulatory Visit: Payer: PPO

## 2021-02-27 ENCOUNTER — Ambulatory Visit: Payer: PPO

## 2021-03-01 ENCOUNTER — Other Ambulatory Visit: Payer: Self-pay | Admitting: *Deleted

## 2021-03-01 DIAGNOSIS — C61 Malignant neoplasm of prostate: Secondary | ICD-10-CM

## 2021-03-03 ENCOUNTER — Inpatient Hospital Stay (HOSPITAL_BASED_OUTPATIENT_CLINIC_OR_DEPARTMENT_OTHER): Payer: PPO | Admitting: Oncology

## 2021-03-03 ENCOUNTER — Inpatient Hospital Stay: Payer: PPO | Attending: Oncology

## 2021-03-03 ENCOUNTER — Inpatient Hospital Stay: Payer: PPO

## 2021-03-03 VITALS — BP 123/66 | HR 53 | Temp 96.4°F | Resp 18 | Wt 265.5 lb

## 2021-03-03 DIAGNOSIS — C7951 Secondary malignant neoplasm of bone: Secondary | ICD-10-CM | POA: Diagnosis not present

## 2021-03-03 DIAGNOSIS — C61 Malignant neoplasm of prostate: Secondary | ICD-10-CM | POA: Diagnosis not present

## 2021-03-03 DIAGNOSIS — G620 Drug-induced polyneuropathy: Secondary | ICD-10-CM | POA: Diagnosis not present

## 2021-03-03 DIAGNOSIS — T451X5D Adverse effect of antineoplastic and immunosuppressive drugs, subsequent encounter: Secondary | ICD-10-CM | POA: Diagnosis not present

## 2021-03-03 DIAGNOSIS — Z9079 Acquired absence of other genital organ(s): Secondary | ICD-10-CM | POA: Insufficient documentation

## 2021-03-03 DIAGNOSIS — Z5181 Encounter for therapeutic drug level monitoring: Secondary | ICD-10-CM

## 2021-03-03 DIAGNOSIS — Z79899 Other long term (current) drug therapy: Secondary | ICD-10-CM | POA: Diagnosis not present

## 2021-03-03 DIAGNOSIS — Z95828 Presence of other vascular implants and grafts: Secondary | ICD-10-CM

## 2021-03-03 DIAGNOSIS — E860 Dehydration: Secondary | ICD-10-CM

## 2021-03-03 LAB — COMPREHENSIVE METABOLIC PANEL
ALT: 8 U/L (ref 0–44)
AST: 26 U/L (ref 15–41)
Albumin: 3.8 g/dL (ref 3.5–5.0)
Alkaline Phosphatase: 84 U/L (ref 38–126)
Anion gap: 9 (ref 5–15)
BUN: 25 mg/dL — ABNORMAL HIGH (ref 8–23)
CO2: 27 mmol/L (ref 22–32)
Calcium: 9.7 mg/dL (ref 8.9–10.3)
Chloride: 97 mmol/L — ABNORMAL LOW (ref 98–111)
Creatinine, Ser: 1.26 mg/dL — ABNORMAL HIGH (ref 0.61–1.24)
GFR, Estimated: >60 mL/min (ref >60)
Glucose, Bld: 106 mg/dL — ABNORMAL HIGH (ref 70–99)
Potassium: 3.8 mmol/L (ref 3.5–5.1)
Sodium: 133 mmol/L — ABNORMAL LOW (ref 135–145)
Total Bilirubin: 0.9 mg/dL (ref 0.3–1.2)
Total Protein: 7.3 g/dL (ref 6.5–8.1)

## 2021-03-03 LAB — CBC WITH DIFFERENTIAL/PLATELET
Abs Immature Granulocytes: 0.01 10*3/uL (ref 0.00–0.07)
Basophils Absolute: 0 10*3/uL (ref 0.0–0.1)
Basophils Relative: 0 %
Eosinophils Absolute: 0.3 10*3/uL (ref 0.0–0.5)
Eosinophils Relative: 5 %
HCT: 30.2 % — ABNORMAL LOW (ref 39.0–52.0)
Hemoglobin: 9.7 g/dL — ABNORMAL LOW (ref 13.0–17.0)
Immature Granulocytes: 0 %
Lymphocytes Relative: 21 %
Lymphs Abs: 1 10*3/uL (ref 0.7–4.0)
MCH: 28.4 pg (ref 26.0–34.0)
MCHC: 32.1 g/dL (ref 30.0–36.0)
MCV: 88.6 fL (ref 80.0–100.0)
Monocytes Absolute: 0.4 10*3/uL (ref 0.1–1.0)
Monocytes Relative: 9 %
Neutro Abs: 3.1 10*3/uL (ref 1.7–7.7)
Neutrophils Relative %: 65 %
Platelets: 167 10*3/uL (ref 150–400)
RBC: 3.41 MIL/uL — ABNORMAL LOW (ref 4.22–5.81)
RDW: 15.7 % — ABNORMAL HIGH (ref 11.5–15.5)
WBC: 4.8 10*3/uL (ref 4.0–10.5)
nRBC: 0 % (ref 0.0–0.2)

## 2021-03-03 LAB — PSA: Prostatic Specific Antigen: 75.03 ng/mL — ABNORMAL HIGH (ref 0.00–4.00)

## 2021-03-03 MED ORDER — SODIUM CHLORIDE 0.9% FLUSH
10.0000 mL | Freq: Once | INTRAVENOUS | Status: AC
  Administered 2021-03-03: 10 mL via INTRAVENOUS
  Filled 2021-03-03: qty 10

## 2021-03-03 MED ORDER — METHYLPREDNISOLONE SODIUM SUCC 125 MG IJ SOLR: 125.0000 mg | Freq: Once | INTRAMUSCULAR | Status: DC | PRN

## 2021-03-03 MED ORDER — HEPARIN SOD (PORK) LOCK FLUSH 100 UNIT/ML IV SOLN
500.0000 [IU] | Freq: Once | INTRAVENOUS | Status: AC
  Administered 2021-03-03: 500 [IU] via INTRAVENOUS
  Filled 2021-03-03: qty 5

## 2021-03-03 MED ORDER — DENOSUMAB 120 MG/1.7ML ~~LOC~~ SOLN
120.0000 mg | SUBCUTANEOUS | Status: AC
  Administered 2021-03-03: 120 mg via SUBCUTANEOUS
  Filled 2021-03-03: qty 1.7

## 2021-03-03 MED ORDER — DIPHENHYDRAMINE HCL 50 MG/ML IJ SOLN: 25.0000 mg | Freq: Once | INTRAMUSCULAR | Status: DC | PRN

## 2021-03-03 MED ORDER — EPINEPHRINE 1 MG/10ML IJ SOSY
0.2500 mg | PREFILLED_SYRINGE | Freq: Once | INTRAMUSCULAR | Status: AC | PRN
  Filled 2021-03-03: qty 2.5

## 2021-03-03 MED ORDER — SODIUM CHLORIDE 0.9 % IV SOLN
Freq: Once | INTRAVENOUS | Status: AC | PRN
Start: 2021-03-03 — End: ?
  Filled 2021-03-03: qty 250

## 2021-03-03 MED ORDER — HEPARIN SOD (PORK) LOCK FLUSH 100 UNIT/ML IV SOLN
INTRAVENOUS | Status: AC
  Filled 2021-03-03: qty 5

## 2021-03-03 MED ORDER — DIPHENHYDRAMINE HCL 50 MG/ML IJ SOLN: 50.0000 mg | Freq: Once | INTRAMUSCULAR | Status: DC | PRN

## 2021-03-03 MED ORDER — ALBUTEROL SULFATE (2.5 MG/3ML) 0.083% IN NEBU
2.5000 mg | INHALATION_SOLUTION | Freq: Once | RESPIRATORY_TRACT | Status: AC | PRN
  Filled 2021-03-03: qty 3

## 2021-03-03 NOTE — Progress Notes (Signed)
Hematology/Oncology Consult note [facility]  [phone] [phone]  Patient Care Team: Rusty Aus, MD as PCP - General (Internal Medicine) Rusty Aus, MD (Internal Medicine)   Name of the patient: Jeremy Carney  [id number]  01-23-1946   Date of visit: 03/03/21  Diagnosis- castrate resistant prostate cancer with bone and lymph node metastases   Chief complaint/ Reason for visit-routine follow-up of prostate cancer  Heme/Onc history:  Patient is a75 year old gentleman with a prior history of prostate cancer in 1999 status post radical prostatectomy. . He again began to have a rising PSA in 2010 and underwent orchiectomy in 2010.He had biochemical recurrence in 2017 and was treated with IMRT  He underwent CT abdomen and pelvis in May 2018which showed abnormal periaortic and right common iliac lymph nodes. Index aortocaval lymph node 1.4 cm which was new as compared to February 2014 and suspicious for malignancy. Bone scan did not reveal any evidence of bony metastatic disease.PSA went up from 3.9 in January 20 18-7.8 in April 2018. 13.3 in July 2018 when docetaxel was initiated. Doubling time at that time was about 3 months  Docetaxel chemo initiated for CRPC with ln mets and rapid doubling PSA in august 2018.Patient completed 5 cycles of docetaxel on 09/27/2017. Cycle #6 was not given due to progressive fatigue and cytopenias as well as worsening neuropathy. Cycle 5 of docetaxel was dose reduced to 60 mg/m square.Patient never had normalization of his PSA following docetaxel and the lowest PSA after docetaxel was 5.64 in December 2018  In April 2019 patient noted to have rising PSAto 19.02and increase in the size of retroperitoneal LN. Patient therefore started second line zytiga.  Patient has remained on Zytiga since April 2019. The lowest PSA value on Zytiga was 6.1 in August 2020 and since then there has been a  small but steady increase in his PSA.Most recent imaging in November 2020 showed increase in the size of retroperitoneal lymph nodes from 0.7 cm to 1.1 cm. New periaortic lymph node 1.2 cm. Slight progression of sclerotic bone metastases involving the left scapula as well as T4 and T7. Most recent PSA on 03/07/2020 was 23.9.Given the subtle progression Zytiga was not changed at that time.  repat LN biopsy showed adenocarcinoma prostate. NGSPD-L1 1%. PTEN C388C (R130X)  Patient went to [facility] for second opinion and was recommended Provenge given the rise in PSA and mild radiological progression.Patient had disease progression on Provenge and switch to third line cabazitaxel.  Scans after 6 cycles of cabazitaxel showed stable disease   Interval history-appetite is fair.  Patient reports chronic fatigue.  Neuropathy in his hands and feet is fairly stable.  ECOG PS- 1 Pain scale- 2 Opioid associated constipation- no  Review of systems- Review of Systems  Constitutional: Positive for malaise/fatigue. Negative for chills, fever and weight loss.  HENT: Negative for congestion, ear discharge and nosebleeds.   Eyes: Negative for blurred vision.  Respiratory: Negative for cough, hemoptysis, sputum production, shortness of breath and wheezing.   Cardiovascular: Negative for chest pain, palpitations, orthopnea and claudication.  Gastrointestinal: Negative for abdominal pain, blood in stool, constipation, diarrhea, heartburn, melena, nausea and vomiting.  Genitourinary: Negative for dysuria, flank pain, frequency, hematuria and urgency.  Musculoskeletal: Negative for back pain, joint pain and myalgias.  Skin: Negative for rash.  Neurological: Negative for dizziness, tingling, focal weakness, seizures, weakness and headaches.  Endo/Heme/Allergies: Does not bruise/bleed easily.  Psychiatric/Behavioral: Negative for depression and suicidal ideas. The  patient does not have insomnia.        Allergies  Allergen Reactions  . Latex Rash    Sensitivity, not allergy.     Past Medical History:  Diagnosis Date  . Arthritis, degenerative 10/30/2015  . Atrial fibrillation ([place]) 10/30/2015  . Bladder spasm 06/12/2013  . Carcinoma of prostate ([place]) 10/30/2015  . Cardiomyopathy ([place]) 09/10/2014   Overview:  EF 35%,4/15   . Cerebrovascular accident (CVA) ([place]) 10/30/2015   Overview:  Left embolic [id number]   . Difficult or painful urination 11/21/2013  . Dysrhythmia   . Gout 10/30/2015  . History of kidney stones   . Malignant neoplasm of prostate ([place]) 09/05/2012  . Sinoatrial node dysfunction (HCC) 10/30/2015   Overview:  DDD pacemaker, 1996      Past Surgical History:  Procedure Laterality Date  . castration  10/2009  . CATARACT EXTRACTION W/ INTRAOCULAR LENS  IMPLANT, BILATERAL Bilateral 2016   right eye done then the left one done 3 months later in 2016  . COLONOSCOPY WITH PROPOFOL N/A 11/21/2019   Procedure: COLONOSCOPY WITH PROPOFOL;  Surgeon: Robert Bellow, MD;  Location: [facility] ENDOSCOPY;  Service: Endoscopy;  Laterality: N/A;  . INGUINAL HERNIA REPAIR Left   . INSERT / REPLACE / REMOVE PACEMAKER    . IR FLUORO GUIDE PORT INSERTION RIGHT  07/01/2017  . IR REMOVAL TUN ACCESS W/ PORT W/O FL MOD SED  06/20/2020  . IR REMOVAL TUN CV CATH W/O FL  08/01/2020  . JOINT REPLACEMENT Bilateral 1980   hips  . left knee replacement  2014  . PACEMAKER INSERTION    . PORTA CATH INSERTION N/A 09/11/2020   Procedure: PORTA CATH INSERTION;  Surgeon: Algernon Huxley, MD;  Location: [facility];  Service: Cardiovascular;  Laterality: N/A;  . PROSTATECTOMY    . REPLACEMENT TOTAL HIP W/  RESURFACING IMPLANTS  1997   bilateral-   . REPLACEMENT TOTAL KNEE Bilateral   . testes removal     per pt   . TONSILLECTOMY AND ADENOIDECTOMY    . TRANSURETHRAL RESECTION OF BLADDER TUMOR N/A 12/31/2015   Procedure: BLADDER BIOPSY;  Surgeon: Nickie Retort, MD;  Location: [facility] ORS;   Service: Urology;  Laterality: N/A;  . Urinary incontinence valve     AMS    Social History   Socioeconomic History  . Marital status: Married    Spouse name: Not on file  . Number of children: Not on file  . Years of education: Not on file  . Highest education level: Not on file  Occupational History  . Not on file  Tobacco Use  . Smoking status: Never Smoker  . Smokeless tobacco: Never Used  Vaping Use  . Vaping Use: Never used  Substance and Sexual Activity  . Alcohol use: No    Alcohol/week: 0.0 standard drinks  . Drug use: No  . Sexual activity: Never  Other Topics Concern  . Not on file  Social History Narrative   Lives at home with spouse   Social Determinants of Health   Financial Resource Strain: Not on file  Food Insecurity: Not on file  Transportation Needs: Not on file  Physical Activity: Not on file  Stress: Not on file  Social Connections: Not on file  Intimate Partner Violence: Not on file    Family History  Problem Relation Age of Onset  . Cancer Mother   . Arthritis Mother   . Heart attack Father   . Arthritis Father   .  Transient ischemic attack Brother   . Prostate cancer Neg Hx   . Bladder Cancer Neg Hx      Current Outpatient Medications:  .  acetaminophen (TYLENOL) 325 MG tablet, Take 650 mg by mouth 2 (two) times daily., Disp: , Rfl:  .  allopurinol (ZYLOPRIM) 300 MG tablet, Take by mouth daily. In am., Disp: , Rfl:  .  bisacodyl (DULCOLAX) 5 MG EC tablet, Take 5 mg by mouth daily as needed. , Disp: , Rfl:  .  calcium carbonate (OS-CAL) 600 MG TABS tablet, Take 600 mg by mouth 2 (two) times daily with a meal., Disp: , Rfl:  .  cyanocobalamin (,VITAMIN B-12,) 1000 MCG/ML injection, Inject 1,000 mcg into the muscle every 30 (thirty) days. , Disp: , Rfl:  .  diltiazem (CARDIZEM CD) 180 MG 24 hr capsule, Take 180 mg by mouth daily., Disp: , Rfl:  .  ELIQUIS 5 MG TABS tablet, Take 5 mg by mouth 2 (two) times daily., Disp: , Rfl:  .   furosemide (LASIX) 20 MG tablet, Take 20 mg by mouth 2 (two) times daily., Disp: , Rfl:  .  gabapentin (NEURONTIN) 300 MG capsule, Take 1 capsule (300 mg total) by mouth 3 (three) times daily., Disp: 90 capsule, Rfl: 3 .  HYDROcodone-acetaminophen (NORCO) 5-325 MG tablet, Take 1 tablet by mouth every 4 (four) hours as needed for moderate pain., Disp: 60 tablet, Rfl: 0 .  lidocaine-prilocaine (EMLA) cream, Apply to affected area once, Disp: 30 g, Rfl: 3 .  potassium chloride (KLOR-CON) 10 MEQ tablet, TAKE 2 TABLETS BY MOUTH ONCE DAILY, Disp: 60 tablet, Rfl: 2 .  simvastatin (ZOCOR) 40 MG tablet, Take 40 mg by mouth daily at 6 PM. , Disp: , Rfl:  No current facility-administered medications for this visit.  Facility-Administered Medications Ordered in Other Visits:  .  0.9 %  sodium chloride infusion, , Intravenous, Once PRN, Sindy Guadeloupe, MD .  albuterol (PROVENTIL) (2.5 MG/3ML) 0.083% nebulizer solution 2.5 mg, 2.5 mg, Nebulization, Once PRN, Sindy Guadeloupe, MD .  EPINEPHrine (ADRENALIN) 1 MG/10ML injection 0.25 mg, 0.25 mg, Intravenous, Once PRN, Sindy Guadeloupe, MD .  EPINEPHrine (ADRENALIN) 1 MG/10ML injection 0.25 mg, 0.25 mg, Intravenous, Once PRN, Sindy Guadeloupe, MD  Physical exam:  Vitals:   03/03/21 0948  BP: 123/66  Pulse: (!) 53  Resp: 18  Temp: (!) 96.4 F (35.8 C)  TempSrc: Tympanic  SpO2: 97%  Weight: 265 lb 8 oz (120.4 kg)   Physical Exam Constitutional:      General: He is not in acute distress. Cardiovascular:     Rate and Rhythm: Normal rate and regular rhythm.     Heart sounds: Normal heart sounds.  Pulmonary:     Effort: Pulmonary effort is normal.     Breath sounds: Normal breath sounds.  Abdominal:     General: Bowel sounds are normal.     Palpations: Abdomen is soft.  Skin:    General: Skin is warm and dry.  Neurological:     Mental Status: He is alert and oriented to person, place, and time.      CMP Latest Ref Rng & Units 03/03/2021  Glucose 70 -  99 mg/dL 106(H)  BUN 8 - 23 mg/dL 25(H)  Creatinine 0.61 - 1.24 mg/dL 1.26(H)  Sodium 135 - 145 mmol/L 133(L)  Potassium 3.5 - 5.1 mmol/L 3.8  Chloride 98 - 111 mmol/L 97(L)  CO2 22 - 32 mmol/L 27  Calcium 8.9 -  10.3 mg/dL 9.7  Total Protein 6.5 - 8.1 g/dL 7.3  Total Bilirubin 0.3 - 1.2 mg/dL 0.9  Alkaline Phos 38 - 126 U/L 84  AST 15 - 41 U/L 26  ALT 0 - 44 U/L 8   CBC Latest Ref Rng & Units 03/03/2021  WBC 4.0 - 10.5 K/uL 4.8  Hemoglobin 13.0 - 17.0 g/dL 9.7(L)  Hematocrit 39.0 - 52.0 % 30.2(L)  Platelets 150 - 400 K/uL 167     Assessment and plan- Patient is a 75 y.o. 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  He is here for routine follow-up  PSA from today is pending.  After 6 cycles of cabazitaxel his PSA came down from 145-47 But has never normalized.  CT scan showed improvement in mediastinal adenopathy and mild enlargement and retroperitoneal adenopathy which still remains extensive at about 6.6 cm.  Patient has gone through multiple lines of treatment for prostate cancer and his only meaningful option presently is Gillermina Phy should he have overt disease progression.  At this time I am monitoring his prostate cancer off any treatment.  He is already had bilateral orchiectomy and therefore not receiving any Lupron shots.  He will receive Xgeva for bone metastases today and in 1 month and I will see him back in 2 months with CBC with differential CMP and PSA   Visit Diagnosis 1. Malignant neoplasm of prostate ([place])   2. Bone metastases ([place])   3. Encounter for monitoring denosumab therapy      Dr. Randa Evens, MD, MPH [facility] [phone] 03/03/2021 1:36 PM

## 2021-03-03 NOTE — Progress Notes (Signed)
Pt in for follow up, reports changed from warfin to eliquis. Pt states feeling "alittle more sluggish today".

## 2021-03-05 ENCOUNTER — Other Ambulatory Visit: Payer: Self-pay | Admitting: Oncology

## 2021-03-09 ENCOUNTER — Telehealth: Payer: Self-pay | Admitting: *Deleted

## 2021-03-09 ENCOUNTER — Other Ambulatory Visit: Payer: Self-pay | Admitting: *Deleted

## 2021-03-09 DIAGNOSIS — C61 Malignant neoplasm of prostate: Secondary | ICD-10-CM

## 2021-03-09 DIAGNOSIS — M549 Dorsalgia, unspecified: Secondary | ICD-10-CM

## 2021-03-09 MED ORDER — HYDROCODONE-ACETAMINOPHEN 5-325 MG PO TABS: 1.0000 | ORAL_TABLET | ORAL | 0 refills | Status: DC | PRN

## 2021-03-09 NOTE — Telephone Encounter (Signed)
Pt called in and needs refill for pain med and he says he thinks he has a kidney stone and I suggested to him to call Urology and he said that he wanted dr Janese Banks to order ct scan first. The pt. Called back and left message that he needs refill quickly and call him. I called him back and got voicemail. I left message for him that Dr. Janese Banks said that she wanted him to com ein and get ua an blood work to see his kidney function and she will refill his norco and increase the amount of pain meds to 120. Wanted him to call back to see what exact symptoms he is having

## 2021-03-09 NOTE — Telephone Encounter (Signed)
Pt called and left voicemail about needs pain meds refilled as well as he thinks he has kidney stones. Called him and asked about where he is hurting. Mid back on left side. He has had stones before and got lithotripsy and it was several years ago over 10 years he thinks. I asked why he did not call urology. He said he has u/s set up for the future but felt ct scan would be best. I spoke to rao and she will refill pain med and increase the amount, she wants him to com tom. For ua and metc to check kidney function and she will get in touch with dr brandon to see what we should due. Pt agreeable and wants 3 pm appt. One was made and orders entered

## 2021-03-10 ENCOUNTER — Inpatient Hospital Stay: Payer: PPO

## 2021-03-10 DIAGNOSIS — M549 Dorsalgia, unspecified: Secondary | ICD-10-CM

## 2021-03-10 DIAGNOSIS — C61 Malignant neoplasm of prostate: Secondary | ICD-10-CM

## 2021-03-10 DIAGNOSIS — C7951 Secondary malignant neoplasm of bone: Secondary | ICD-10-CM | POA: Diagnosis not present

## 2021-03-10 LAB — URINALYSIS, COMPLETE (UACMP) WITH MICROSCOPIC
Bacteria, UA: NONE SEEN
Bilirubin Urine: NEGATIVE
Glucose, UA: NEGATIVE mg/dL
Hgb urine dipstick: NEGATIVE
Ketones, ur: NEGATIVE mg/dL
Leukocytes,Ua: NEGATIVE
Nitrite: NEGATIVE
Protein, ur: NEGATIVE mg/dL
Specific Gravity, Urine: 1.01 (ref 1.005–1.030)
pH: 5 (ref 5.0–8.0)

## 2021-03-10 LAB — COMPREHENSIVE METABOLIC PANEL
ALT: 8 U/L (ref 0–44)
AST: 30 U/L (ref 15–41)
Albumin: 4 g/dL (ref 3.5–5.0)
Alkaline Phosphatase: 82 U/L (ref 38–126)
Anion gap: 13 (ref 5–15)
BUN: 26 mg/dL — ABNORMAL HIGH (ref 8–23)
CO2: 25 mmol/L (ref 22–32)
Calcium: 9.9 mg/dL (ref 8.9–10.3)
Chloride: 94 mmol/L — ABNORMAL LOW (ref 98–111)
Creatinine, Ser: 1.44 mg/dL — ABNORMAL HIGH (ref 0.61–1.24)
GFR, Estimated: 52 mL/min — ABNORMAL LOW (ref >60)
Glucose, Bld: 93 mg/dL (ref 70–99)
Potassium: 4.3 mmol/L (ref 3.5–5.1)
Sodium: 132 mmol/L — ABNORMAL LOW (ref 135–145)
Total Bilirubin: 1 mg/dL (ref 0.3–1.2)
Total Protein: 7.3 g/dL (ref 6.5–8.1)

## 2021-03-10 LAB — CBC WITH DIFFERENTIAL/PLATELET
Abs Immature Granulocytes: 0.03 10*3/uL (ref 0.00–0.07)
Basophils Absolute: 0 10*3/uL (ref 0.0–0.1)
Basophils Relative: 1 %
Eosinophils Absolute: 0.2 10*3/uL (ref 0.0–0.5)
Eosinophils Relative: 5 %
HCT: 32.5 % — ABNORMAL LOW (ref 39.0–52.0)
Hemoglobin: 10.6 g/dL — ABNORMAL LOW (ref 13.0–17.0)
Immature Granulocytes: 1 %
Lymphocytes Relative: 21 %
Lymphs Abs: 1.1 10*3/uL (ref 0.7–4.0)
MCH: 29.1 pg (ref 26.0–34.0)
MCHC: 32.6 g/dL (ref 30.0–36.0)
MCV: 89.3 fL (ref 80.0–100.0)
Monocytes Absolute: 0.5 10*3/uL (ref 0.1–1.0)
Monocytes Relative: 9 %
Neutro Abs: 3.4 10*3/uL (ref 1.7–7.7)
Neutrophils Relative %: 63 %
Platelets: 175 10*3/uL (ref 150–400)
RBC: 3.64 MIL/uL — ABNORMAL LOW (ref 4.22–5.81)
RDW: 15.7 % — ABNORMAL HIGH (ref 11.5–15.5)
WBC: 5.3 10*3/uL (ref 4.0–10.5)
nRBC: 0 % (ref 0.0–0.2)

## 2021-03-13 ENCOUNTER — Telehealth: Payer: Self-pay | Admitting: *Deleted

## 2021-03-13 NOTE — Telephone Encounter (Signed)
Called Jeremy Carney he left me a message on the recorder to call him.  We were working with Dr. Erlene Quan at an abnormal UA with blood in it and a creatinine that was elevated.  Called over to the office and Dr. Erlene Quan was on vacation and will not be back until 4/ 4.  The patient had an ultrasound renal ordered for 411 and we went and called the urology office with Dr. Erlene Quan and checked with one of the other doctors and they were okay with [place] moving the appointment up.  We had the ultrasound moved up to 4 /4 patient is aware and its in the [place] office.  Explained to Jeremy Carney that we were making it sooner so that we can see what is going on because his creatinine was elevated and they did see blood in the urine.  Patient states that he feels like he has a hemorrhoid and he can fill it and it feels red and swollen and then he does not see any blood and then a couple weeks later he will see some more blood.  I told him once we get the results of the ultrasound then Dr. Janese Banks will be contacting Dr. Erlene Quan and see what both of them think to take care of him.  .  Patient agreeable to the plan

## 2021-03-16 ENCOUNTER — Ambulatory Visit
Admission: RE | Admit: 2021-03-16 | Discharge: 2021-03-16 | Disposition: A | Discharge status: Home / Self Care | Payer: PPO | Source: Ambulatory Visit | Attending: Urology | Admitting: Urology

## 2021-03-16 ENCOUNTER — Other Ambulatory Visit: Payer: Self-pay

## 2021-03-16 DIAGNOSIS — N1330 Unspecified hydronephrosis: Secondary | ICD-10-CM | POA: Insufficient documentation

## 2021-03-17 ENCOUNTER — Telehealth: Payer: Self-pay | Admitting: Oncology

## 2021-03-17 ENCOUNTER — Telehealth: Payer: Self-pay

## 2021-03-17 NOTE — Telephone Encounter (Signed)
Pt scheduled. Pt aware

## 2021-03-17 NOTE — Telephone Encounter (Signed)
Left VM with patient to notify him of slight change of appt time on 4/19--to accommodate addition of MD encounter.

## 2021-03-17 NOTE — Telephone Encounter (Signed)
-----   Message from Hollice Espy, MD sent at 03/17/2021 11:04 AM EDT ----- Called patient to review renal ultrasound results.  Also discussed with Dr. Janese Banks.  Renal function is worsening.  Per his wife, he sometimes has trouble comprehending over the phone.   As such, recommended moving follow-up to this week.  He is available to come in this Friday at 2:45 to discuss/ arrange nephrostomy vs. stent.  Please schedule.  Hollice Espy, MD

## 2021-03-20 ENCOUNTER — Other Ambulatory Visit: Payer: Self-pay

## 2021-03-20 ENCOUNTER — Other Ambulatory Visit: Payer: Self-pay | Admitting: *Deleted

## 2021-03-20 ENCOUNTER — Ambulatory Visit: Payer: PPO | Admitting: Urology

## 2021-03-20 ENCOUNTER — Ambulatory Visit: Payer: PPO

## 2021-03-20 ENCOUNTER — Encounter: Payer: Self-pay | Admitting: Urology

## 2021-03-20 VITALS — BP 116/64 | HR 86 | Ht 72.0 in | Wt 254.0 lb

## 2021-03-20 DIAGNOSIS — N1330 Unspecified hydronephrosis: Secondary | ICD-10-CM | POA: Diagnosis not present

## 2021-03-21 LAB — URINALYSIS, COMPLETE
Bilirubin, UA: NEGATIVE
Glucose, UA: NEGATIVE
Ketones, UA: NEGATIVE
Leukocytes,UA: NEGATIVE
Nitrite, UA: NEGATIVE
Protein,UA: NEGATIVE
RBC, UA: NEGATIVE
Specific Gravity, UA: 1.01 (ref 1.005–1.030)
Urobilinogen, Ur: 0.2 mg/dL (ref 0.2–1.0)
pH, UA: 5.5 (ref 5.0–7.5)

## 2021-03-21 LAB — BASIC METABOLIC PANEL
BUN/Creatinine Ratio: 15 (ref 10–24)
BUN: 21 mg/dL (ref 8–27)
CO2: 22 mmol/L (ref 20–29)
Calcium: 9.8 mg/dL (ref 8.6–10.2)
Chloride: 98 mmol/L (ref 96–106)
Creatinine, Ser: 1.41 mg/dL — ABNORMAL HIGH (ref 0.76–1.27)
Glucose: 104 mg/dL — ABNORMAL HIGH (ref 65–99)
Potassium: 4.6 mmol/L (ref 3.5–5.2)
Sodium: 136 mmol/L (ref 134–144)
eGFR: 53 mL/min/{1.73_m2} — ABNORMAL LOW (ref >59)

## 2021-03-21 LAB — MICROSCOPIC EXAMINATION: Bacteria, UA: NONE SEEN

## 2021-03-23 ENCOUNTER — Other Ambulatory Visit: Payer: Self-pay

## 2021-03-23 ENCOUNTER — Other Ambulatory Visit: Payer: Self-pay | Admitting: *Deleted

## 2021-03-23 ENCOUNTER — Telehealth: Payer: Self-pay | Admitting: *Deleted

## 2021-03-23 MED ORDER — OXYCODONE HCL 10 MG PO TABS: 10.0000 mg | ORAL_TABLET | ORAL | 0 refills | Status: DC | PRN

## 2021-03-23 NOTE — Progress Notes (Signed)
03/20/2021 2:21 PM   Jeremy Carney 1946/10/29 [id number]  Referring provider: Rusty Aus, MD [facility] [place],  [place] [address]  Chief Complaint  Patient presents with  . Hydronephrosis    HPI: 75 year old male with personal history of advanced castrate resistant metastatic prostate cancer who returns today to discuss worsening renal function in the setting of new right-sided hydronephrosis.  He is accompanied today by his wife.  He denies any flank pain.  He has chronic fatigue.  No significant urinary symptoms or changes in urinary symptoms.  More recently, he underwent an ultrasound sooner than scheduled that shows stable moderate right hydronephrosis which persists.  His renal function has continued to worsen with upward trending creatinine down to 1.4.  His PSA is also trending upwards.  He is scheduled for a CT scan and bone scan on 05-06-21.  He returns today specifically to discuss management of his right hydronephrosis in the setting of worsening renal function.   PMH: Past Medical History:  Diagnosis Date  . Arthritis, degenerative 10/30/2015  . Atrial fibrillation ([place]) 10/30/2015  . Bladder spasm 06/12/2013  . Carcinoma of prostate ([place]) 10/30/2015  . Cardiomyopathy ([place]) 09/10/2014   Overview:  EF 35%,4/15   . Cerebrovascular accident (CVA) ([place]) 10/30/2015   Overview:  Left embolic MEB,5830   . Difficult or painful urination 11/21/2013  . Dysrhythmia   . Gout 10/30/2015  . History of kidney stones   . Malignant neoplasm of prostate ([place]) 09/05/2012  . Sinoatrial node dysfunction (HCC) 10/30/2015   Overview:  DDD pacemaker, 1996     Surgical History: Past Surgical History:  Procedure Laterality Date  . castration  10/2009  . CATARACT EXTRACTION W/ INTRAOCULAR LENS  IMPLANT, BILATERAL Bilateral 2016   right eye done then the left one done 3 months later in 2016  . COLONOSCOPY WITH PROPOFOL N/A 11/21/2019    Procedure: COLONOSCOPY WITH PROPOFOL;  Surgeon: Robert Bellow, MD;  Location: [facility] ENDOSCOPY;  Service: Endoscopy;  Laterality: N/A;  . INGUINAL HERNIA REPAIR Left   . INSERT / REPLACE / REMOVE PACEMAKER    . IR FLUORO GUIDE PORT INSERTION RIGHT  07/01/2017  . IR REMOVAL TUN ACCESS W/ PORT W/O FL MOD SED  06/20/2020  . IR REMOVAL TUN CV CATH W/O FL  08/01/2020  . JOINT REPLACEMENT Bilateral 1980   hips  . left knee replacement  2014  . PACEMAKER INSERTION    . PORTA CATH INSERTION N/A 09/11/2020   Procedure: PORTA CATH INSERTION;  Surgeon: Algernon Huxley, MD;  Location: [facility];  Service: Cardiovascular;  Laterality: N/A;  . PROSTATECTOMY    . REPLACEMENT TOTAL HIP W/  RESURFACING IMPLANTS  1997   bilateral-   . REPLACEMENT TOTAL KNEE Bilateral   . testes removal     per pt   . TONSILLECTOMY AND ADENOIDECTOMY    . TRANSURETHRAL RESECTION OF BLADDER TUMOR N/A 12/31/2015   Procedure: BLADDER BIOPSY;  Surgeon: Nickie Retort, MD;  Location: [facility];  Service: Urology;  Laterality: N/A;  . Urinary incontinence valve     AMS    Home Medications:  Allergies as of 03/20/2021      Reactions   Latex Rash   Sensitivity, not allergy.      Medication List       Accurate as of March 20, 2021 11:59 PM. If you have any questions, ask your nurse or doctor.  acetaminophen 325 MG tablet Commonly known as: TYLENOL Take 650 mg by mouth 2 (two) times daily.   allopurinol 300 MG tablet Commonly known as: ZYLOPRIM Take by mouth daily. In am.   bisacodyl 5 MG EC tablet Commonly known as: DULCOLAX Take 5 mg by mouth daily as needed.   calcium carbonate 600 MG Tabs tablet Commonly known as: OS-CAL Take 600 mg by mouth 2 (two) times daily with a meal.   cyanocobalamin 1000 MCG/ML injection Commonly known as: (VITAMIN B-12) Inject 1,000 mcg into the muscle every 30 (thirty) days.   diltiazem 180 MG 24 hr capsule Commonly known as: CARDIZEM CD Take 180 mg by mouth  daily.   Eliquis 5 MG Tabs tablet Generic drug: apixaban Take 5 mg by mouth 2 (two) times daily.   furosemide 20 MG tablet Commonly known as: LASIX Take 20 mg by mouth 2 (two) times daily.   gabapentin 300 MG capsule Commonly known as: NEURONTIN Take 1 capsule (300 mg total) by mouth 3 (three) times daily.   HYDROcodone-acetaminophen 5-325 MG tablet Commonly known as: Norco Take 1 tablet by mouth every 4 (four) hours as needed for moderate pain.   lidocaine-prilocaine cream Commonly known as: EMLA Apply to affected area once   potassium chloride 10 MEQ tablet Commonly known as: KLOR-CON TAKE 2 TABLETS BY MOUTH ONCE DAILY   simvastatin 40 MG tablet Commonly known as: ZOCOR Take 40 mg by mouth daily at 6 PM.       Allergies:  Allergies  Allergen Reactions  . Latex Rash    Sensitivity, not allergy.    Family History: Family History  Problem Relation Age of Onset  . Cancer Mother   . Arthritis Mother   . Heart attack Father   . Arthritis Father   . Transient ischemic attack Brother   . Prostate cancer Neg Hx   . Bladder Cancer Neg Hx     Social History:  reports that he has never smoked. He has never used smokeless tobacco. He reports that he does not drink alcohol and does not use drugs.   Physical Exam: BP 116/64   Pulse 86   Ht 6' (1.829 m)   Wt 254 lb (115.2 kg)   BMI 34.45 kg/m   Constitutional:  Alert and oriented, No acute distress.  Accompanied by wife. HEENT: [place] AT, moist mucus membranes.  Trachea midline, no masses. Cardiovascular: No clubbing, cyanosis, or edema. Skin: No rashes, bruises or suspicious lesions. Neurologic: Grossly intact, no focal deficits, moving all 4 extremities. Psychiatric: Normal mood and affect.  Laboratory Data: Lab Results  Component Value Date   WBC 5.3 03/10/2021   HGB 10.6 (L) 03/10/2021   HCT 32.5 (L) 03/10/2021   MCV 89.3 03/10/2021   PLT 175 03/10/2021    Lab Results  Component Value Date    CREATININE 1.41 (H) 03/20/2021   Urinalysis Results for orders placed or performed in visit on 03/20/21  CULTURE, URINE COMPREHENSIVE   Specimen: Urine   UR  Result Value Ref Range   Urine Culture, Comprehensive Preliminary report    Organism ID, Bacteria Comment   Microscopic Examination   Urine  Result Value Ref Range   WBC, UA 0-5 0 - 5 /hpf   RBC 0-2 0 - 2 /hpf   Epithelial Cells (non renal) 0-10 0 - 10 /hpf   Casts Present (A) None seen /lpf   Cast Type Hyaline casts N/A   Bacteria, UA None seen None seen/Few  Urinalysis,  Complete  Result Value Ref Range   Specific Gravity, UA 1.010 1.005 - 1.030   pH, UA 5.5 5.0 - 7.5   Color, UA Yellow Yellow   Appearance Ur Clear Clear   Leukocytes,UA Negative Negative   Protein,UA Negative Negative/Trace   Glucose, UA Negative Negative   Ketones, UA Negative Negative   RBC, UA Negative Negative   Bilirubin, UA Negative Negative   Urobilinogen, Ur 0.2 0.2 - 1.0 mg/dL   Nitrite, UA Negative Negative   Microscopic Examination See below:   Basic metabolic panel  Result Value Ref Range   Glucose 104 (H) 65 - 99 mg/dL   BUN 21 8 - 27 mg/dL   Creatinine, Ser 1.41 (H) 0.76 - 1.27 mg/dL   eGFR 53 (L) >59 mL/min/1.73   BUN/Creatinine Ratio 15 10 - 24   Sodium 136 134 - 144 mmol/L   Potassium 4.6 3.5 - 5.2 mmol/L   Chloride 98 96 - 106 mmol/L   CO2 22 20 - 29 mmol/L   Calcium 9.8 8.6 - 10.2 mg/dL     Pertinent Imaging: *Ultrasound renal complete  Narrative CLINICAL DATA:  Right hydronephrosis  EXAM: RENAL / URINARY TRACT ULTRASOUND COMPLETE  COMPARISON:  CT chest, abdomen, and pelvis 01/28/2021  FINDINGS: Right Kidney:  Renal measurements: 10.4 x 4 x 3 x 5.3 cm = volume: 125 mL. Echogenicity within normal limits. Persistent moderate right hydronephrosis and hydroureter.  Left Kidney:  Renal measurements: 13.4 x 4.9 x 3.4 cm = volume: 117 mL. Echogenicity within normal limits. No mass or hydronephrosis visualized. 4  cm renal pelvic cyst again seen.  Bladder:  Bladder is decompressed and cannot be evaluated.  Other:  None.  IMPRESSION: Unchanged moderate right hydronephrosis and hydroureter.   Electronically Signed By: Miachel Roux M.D. On: 03/17/2021 08:33  Renal ultrasound personally reviewed  Assessment & Plan:    1. Hydronephrosis, right Pilar Plate discussion today about management options which include continued observation versus stent versus PCN  We did [employer] discussion about his overall treatment options for his metastatic castrate resistant prostate cancer which has been refractory to treatment.  His PSA does continue to rise.  He has restaging imaging next month.  Further treatment options are limited.  We discussed his palliative goals as it relates to the above clinical situation as part of our conversation today.  He is adamantly not interested in percutaneous nephrostomy tube.  He may be interested in right-sided ureteral stent.  We discussed the option of proceeding to the operating room for cystoscopy, right retrograde pyelogram as well as ureteral stent placement which may also help elucidate the underlying cause of his obstruction whether it is extrinsic or intrinsic.  We discussed the need for ureteral stent exchanges, risk of infection, stent irritation as well as risk of stent failure.  Alternative including observation was also discussed.  We discussed ongoing obstruction can eventually lead to renal atrophy and cortical loss.  This process can be slow but is irreversible.  His contralateral kidney is normal.  Ultimately, he elected for ureteral stent.  If he does not tolerate it, we will simply remove it.  We will also be able to follow-up on his imaging next month to see if it is effective.  He is agreeable this plan.  All questions answered.  BMP today shows stable creatinine of 1.4. - Urinalysis, Complete - CULTURE, URINE COMPREHENSIVE - Basic metabolic  panel  Hollice Espy, MD  [facility] [address]  [place], [place] [address] [phone]

## 2021-03-23 NOTE — Telephone Encounter (Signed)
Called pt to let him know that we are change pain med and increasing it

## 2021-03-23 NOTE — Telephone Encounter (Addendum)
Patient informed, voiced understanding.   ----- Message from Hollice Espy, MD sent at 03/23/2021  7:43 AM EDT ----- Renal function labs show that creatinine is stably elevated/abnormal.  Has not improved any.  Agree proceeding with stent as discussed.  Hollice Espy, MD

## 2021-03-23 NOTE — H&P (View-Only) (Signed)
03/20/2021 2:21 PM   Jeremy Carney 1946/10/29 [id number]  Referring provider: Rusty Aus, MD [facility] [place],  [place] [address]  Chief Complaint  Patient presents with  . Hydronephrosis    HPI: 75 year old male with personal history of advanced castrate resistant metastatic prostate cancer who returns today to discuss worsening renal function in the setting of new right-sided hydronephrosis.  He is accompanied today by his wife.  He denies any flank pain.  He has chronic fatigue.  No significant urinary symptoms or changes in urinary symptoms.  More recently, he underwent an ultrasound sooner than scheduled that shows stable moderate right hydronephrosis which persists.  His renal function has continued to worsen with upward trending creatinine down to 1.4.  His PSA is also trending upwards.  He is scheduled for a CT scan and bone scan on 05-06-21.  He returns today specifically to discuss management of his right hydronephrosis in the setting of worsening renal function.   PMH: Past Medical History:  Diagnosis Date  . Arthritis, degenerative 10/30/2015  . Atrial fibrillation ([place]) 10/30/2015  . Bladder spasm 06/12/2013  . Carcinoma of prostate ([place]) 10/30/2015  . Cardiomyopathy ([place]) 09/10/2014   Overview:  EF 35%,4/15   . Cerebrovascular accident (CVA) ([place]) 10/30/2015   Overview:  Left embolic MEB,5830   . Difficult or painful urination 11/21/2013  . Dysrhythmia   . Gout 10/30/2015  . History of kidney stones   . Malignant neoplasm of prostate ([place]) 09/05/2012  . Sinoatrial node dysfunction (HCC) 10/30/2015   Overview:  DDD pacemaker, 1996     Surgical History: Past Surgical History:  Procedure Laterality Date  . castration  10/2009  . CATARACT EXTRACTION W/ INTRAOCULAR LENS  IMPLANT, BILATERAL Bilateral 2016   right eye done then the left one done 3 months later in 2016  . COLONOSCOPY WITH PROPOFOL N/A 11/21/2019    Procedure: COLONOSCOPY WITH PROPOFOL;  Surgeon: Robert Bellow, MD;  Location: [facility] END[facility]COPY;  Service: Endoscopy;  Laterality: N/A;  . INGUINAL HERNIA REPAIR Left   . INSERT / REPLACE / REMOVE PACEMAKER    . IR FLUORO GUIDE PORT INSERTION RIGHT  07/01/2017  . IR REMOVAL TUN ACCESS W/ PORT W/O FL MOD SED  06/20/2020  . IR REMOVAL TUN CV CATH W/O FL  08/01/2020  . JOINT REPLACEMENT Bilateral 1980   hips  . left knee replacement  2014  . PACEMAKER INSERTION    . PORTA CATH INSERTION N/A 09/11/2020   Procedure: PORTA CATH INSERTION;  Surgeon: Algernon Huxley, MD;  Location: [facility];  Service: Cardiovascular;  Laterality: N/A;  . PROSTATECTOMY    . REPLACEMENT TOTAL HIP W/  RESURFACING IMPLANTS  1997   bilateral-   . REPLACEMENT TOTAL KNEE Bilateral   . testes removal     per pt   . TONSILLECTOMY AND ADENOIDECTOMY    . TRANSURETHRAL RESECTION OF BLADDER TUMOR N/A 12/31/2015   Procedure: BLADDER BIOPSY;  Surgeon: Nickie Retort, MD;  Location: [facility];  Service: Urology;  Laterality: N/A;  . Urinary incontinence valve     AMS    Home Medications:  Allergies as of 03/20/2021      Reactions   Latex Rash   Sensitivity, not allergy.      Medication List       Accurate as of March 20, 2021 11:59 PM. If you have any questions, ask your nurse or doctor.  acetaminophen 325 MG tablet Commonly known as: TYLENOL Take 650 mg by mouth 2 (two) times daily.   allopurinol 300 MG tablet Commonly known as: ZYLOPRIM Take by mouth daily. In am.   bisacodyl 5 MG EC tablet Commonly known as: DULCOLAX Take 5 mg by mouth daily as needed.   calcium carbonate 600 MG Tabs tablet Commonly known as: OS-CAL Take 600 mg by mouth 2 (two) times daily with a meal.   cyanocobalamin 1000 MCG/ML injection Commonly known as: (VITAMIN B-12) Inject 1,000 mcg into the muscle every 30 (thirty) days.   diltiazem 180 MG 24 hr capsule Commonly known as: CARDIZEM CD Take 180 mg by mouth  daily.   Eliquis 5 MG Tabs tablet Generic drug: apixaban Take 5 mg by mouth 2 (two) times daily.   furosemide 20 MG tablet Commonly known as: LASIX Take 20 mg by mouth 2 (two) times daily.   gabapentin 300 MG capsule Commonly known as: NEURONTIN Take 1 capsule (300 mg total) by mouth 3 (three) times daily.   HYDROcodone-acetaminophen 5-325 MG tablet Commonly known as: Norco Take 1 tablet by mouth every 4 (four) hours as needed for moderate pain.   lidocaine-prilocaine cream Commonly known as: EMLA Apply to affected area once   potassium chloride 10 MEQ tablet Commonly known as: KLOR-CON TAKE 2 TABLETS BY MOUTH ONCE DAILY   simvastatin 40 MG tablet Commonly known as: ZOCOR Take 40 mg by mouth daily at 6 PM.       Allergies:  Allergies  Allergen Reactions  . Latex Rash    Sensitivity, not allergy.    Family History: Family History  Problem Relation Age of Onset  . Cancer Mother   . Arthritis Mother   . Heart attack Father   . Arthritis Father   . Transient ischemic attack Brother   . Prostate cancer Neg Hx   . Bladder Cancer Neg Hx     Social History:  reports that he has never smoked. He has never used smokeless tobacco. He reports that he does not drink alcohol and does not use drugs.   Physical Exam: BP 116/64   Pulse 86   Ht 6' (1.829 m)   Wt 254 lb (115.2 kg)   BMI 34.45 kg/m   Constitutional:  Alert and oriented, No acute distress.  Accompanied by wife. HEENT: [place] AT, moist mucus membranes.  Trachea midline, no masses. Cardiovascular: No clubbing, cyanosis, or edema. Skin: No rashes, bruises or suspicious lesions. Neurologic: Grossly intact, no focal deficits, moving all 4 extremities. Psychiatric: Normal mood and affect.  Laboratory Data: Lab Results  Component Value Date   WBC 5.3 03/10/2021   HGB 10.6 (L) 03/10/2021   HCT 32.5 (L) 03/10/2021   MCV 89.3 03/10/2021   PLT 175 03/10/2021    Lab Results  Component Value Date    CREATININE 1.41 (H) 03/20/2021   Urinalysis Results for orders placed or performed in visit on 03/20/21  CULTURE, URINE COMPREHENSIVE   Specimen: Urine   UR  Result Value Ref Range   Urine Culture, Comprehensive Preliminary report    Organism ID, Bacteria Comment   Microscopic Examination   Urine  Result Value Ref Range   WBC, UA 0-5 0 - 5 /hpf   RBC 0-2 0 - 2 /hpf   Epithelial Cells (non renal) 0-10 0 - 10 /hpf   Casts Present (A) None seen /lpf   Cast Type Hyaline casts N/A   Bacteria, UA None seen None seen/Few  Urinalysis,  Complete  Result Value Ref Range   Specific Gravity, UA 1.010 1.005 - 1.030   pH, UA 5.5 5.0 - 7.5   Color, UA Yellow Yellow   Appearance Ur Clear Clear   Leukocytes,UA Negative Negative   Protein,UA Negative Negative/Trace   Glucose, UA Negative Negative   Ketones, UA Negative Negative   RBC, UA Negative Negative   Bilirubin, UA Negative Negative   Urobilinogen, Ur 0.2 0.2 - 1.0 mg/dL   Nitrite, UA Negative Negative   Microscopic Examination See below:   Basic metabolic panel  Result Value Ref Range   Glucose 104 (H) 65 - 99 mg/dL   BUN 21 8 - 27 mg/dL   Creatinine, Ser 1.41 (H) 0.76 - 1.27 mg/dL   eGFR 53 (L) >59 mL/min/1.73   BUN/Creatinine Ratio 15 10 - 24   Sodium 136 134 - 144 mmol/L   Potassium 4.6 3.5 - 5.2 mmol/L   Chloride 98 96 - 106 mmol/L   CO2 22 20 - 29 mmol/L   Calcium 9.8 8.6 - 10.2 mg/dL     Pertinent Imaging: *Ultrasound renal complete  Narrative CLINICAL DATA:  Right hydronephrosis  EXAM: RENAL / URINARY TRACT ULTRASOUND COMPLETE  COMPARISON:  CT chest, abdomen, and pelvis 01/28/2021  FINDINGS: Right Kidney:  Renal measurements: 10.4 x 4 x 3 x 5.3 cm = volume: 125 mL. Echogenicity within normal limits. Persistent moderate right hydronephrosis and hydroureter.  Left Kidney:  Renal measurements: 13.4 x 4.9 x 3.4 cm = volume: 117 mL. Echogenicity within normal limits. No mass or hydronephrosis visualized. 4  cm renal pelvic cyst again seen.  Bladder:  Bladder is decompressed and cannot be evaluated.  Other:  None.  IMPRESSION: Unchanged moderate right hydronephrosis and hydroureter.   Electronically Signed By: Miachel Roux M.D. On: 03/17/2021 08:33  Renal ultrasound personally reviewed  Assessment & Plan:    1. Hydronephrosis, right Pilar Plate discussion today about management options which include continued observation versus stent versus PCN  We did [employer] discussion about his overall treatment options for his metastatic castrate resistant prostate cancer which has been refractory to treatment.  His PSA does continue to rise.  He has restaging imaging next month.  Further treatment options are limited.  We discussed his palliative goals as it relates to the above clinical situation as part of our conversation today.  He is adamantly not interested in percutaneous nephrostomy tube.  He may be interested in right-sided ureteral stent.  We discussed the option of proceeding to the operating room for cystoscopy, right retrograde pyelogram as well as ureteral stent placement which may also help elucidate the underlying cause of his obstruction whether it is extrinsic or intrinsic.  We discussed the need for ureteral stent exchanges, risk of infection, stent irritation as well as risk of stent failure.  Alternative including observation was also discussed.  We discussed ongoing obstruction can eventually lead to renal atrophy and cortical loss.  This process can be slow but is irreversible.  His contralateral kidney is normal.  Ultimately, he elected for ureteral stent.  If he does not tolerate it, we will simply remove it.  We will also be able to follow-up on his imaging next month to see if it is effective.  He is agreeable this plan.  All questions answered.  BMP today shows stable creatinine of 1.4. - Urinalysis, Complete - CULTURE, URINE COMPREHENSIVE - Basic metabolic  panel  Hollice Espy, MD  [facility] [address]  [place], [place] [address] [phone]

## 2021-03-24 ENCOUNTER — Other Ambulatory Visit: Payer: Self-pay | Admitting: Urology

## 2021-03-24 ENCOUNTER — Other Ambulatory Visit: Payer: Self-pay

## 2021-03-24 ENCOUNTER — Other Ambulatory Visit
Admission: RE | Admit: 2021-03-24 | Discharge: 2021-03-24 | Disposition: A | Discharge status: Home / Self Care | Payer: PPO | Source: Ambulatory Visit | Attending: Urology | Admitting: Urology

## 2021-03-24 ENCOUNTER — Telehealth: Payer: Self-pay | Admitting: *Deleted

## 2021-03-24 DIAGNOSIS — Z01812 Encounter for preprocedural laboratory examination: Secondary | ICD-10-CM | POA: Insufficient documentation

## 2021-03-24 DIAGNOSIS — Z20822 Contact with and (suspected) exposure to covid-19: Secondary | ICD-10-CM | POA: Diagnosis not present

## 2021-03-24 DIAGNOSIS — N1330 Unspecified hydronephrosis: Secondary | ICD-10-CM

## 2021-03-24 LAB — CULTURE, URINE COMPREHENSIVE

## 2021-03-24 NOTE — Telephone Encounter (Signed)
Pt called to say that he does not like oxycodone- he could not tell me why so his wife got on the phone and said that when he took it twice that he had dry heaves for several hours. He did not take food with it and that can be a possibility for the cause. Patient wants to go back on norco and I told him that we changed the med from norco to oxycodone because the norco was not taking care of the pain. I told him that he should take oxycodone after eating and try it. I will talk to Janese Banks about this. He wanted to know where to get rid of narcotics and I said fire dept and police dept. He will wait til we figure out what med will do better for him. He is agreeable to plan

## 2021-03-25 ENCOUNTER — Encounter
Admission: RE | Admit: 2021-03-25 | Discharge: 2021-03-25 | Disposition: A | Discharge status: Home / Self Care | Payer: PPO | Source: Ambulatory Visit | Attending: Urology | Admitting: Urology

## 2021-03-25 ENCOUNTER — Other Ambulatory Visit: Payer: Self-pay | Admitting: *Deleted

## 2021-03-25 ENCOUNTER — Ambulatory Visit: Payer: Self-pay | Admitting: Urology

## 2021-03-25 ENCOUNTER — Telehealth: Payer: Self-pay | Admitting: *Deleted

## 2021-03-25 DIAGNOSIS — M1A00X0 Idiopathic chronic gout, unspecified site, without tophus (tophi): Secondary | ICD-10-CM | POA: Diagnosis not present

## 2021-03-25 DIAGNOSIS — I482 Chronic atrial fibrillation, unspecified: Secondary | ICD-10-CM | POA: Diagnosis not present

## 2021-03-25 DIAGNOSIS — E782 Mixed hyperlipidemia: Secondary | ICD-10-CM | POA: Diagnosis not present

## 2021-03-25 DIAGNOSIS — E538 Deficiency of other specified B group vitamins: Secondary | ICD-10-CM | POA: Diagnosis not present

## 2021-03-25 HISTORY — DX: Presence of cardiac pacemaker: Z95.0

## 2021-03-25 LAB — SARS CORONAVIRUS 2 (TAT 6-24 HRS): SARS Coronavirus 2: NEGATIVE

## 2021-03-25 MED ORDER — HYDROCODONE-ACETAMINOPHEN 10-325 MG PO TABS: 1.0000 | ORAL_TABLET | ORAL | 0 refills | Status: AC | PRN

## 2021-03-25 NOTE — Patient Instructions (Addendum)
Your procedure is scheduled on: 03/26/21 Report to [facility]. To find out your arrival time please call [phone] between 1PM - 3PM on 03/25/21.  Remember: Instructions that are not followed completely may result in serious medical risk, up to and including death, or upon the discretion of your surgeon and anesthesiologist your surgery may need to be rescheduled.     _X__ 1. Do not eat food or drink any liquids after midnight the night before your procedure.                 No gum chewing or hard candies.   __X__2.  On the morning of surgery brush your teeth with toothpaste and water, you                 may rinse your mouth with mouthwash if you wish.  Do not swallow any              toothpaste of mouthwash.     _X__ 3.  No Alcohol for 24 hours before or after surgery.   _X__ 4.  Do Not Smoke or use e-cigarettes For 24 Hours Prior to Your Surgery.                 Do not use any chewable tobacco products for at least 6 hours prior to                 surgery.  ____  5.  Bring all medications with you on the day of surgery if instructed.   __X__  6.  Notify your doctor if there is any change in your medical condition      (cold, fever, infections).     Do not wear jewelry, make-up, hairpins, clips or nail polish. Do not wear lotions, powders, or perfumes.  Do not shave 48 hours prior to surgery. Men may shave face and neck. Do not bring valuables to the hospital.    [facility] is not responsible for any belongings or valuables.  Contacts, dentures/partials or body piercings may not be worn into surgery. Bring a case for your contacts, glasses or hearing aids, a denture cup will be supplied. Leave your suitcase in the car. After surgery it may be brought to your room. For patients admitted to the hospital, discharge time is determined by your treatment team.   Patients discharged the day of surgery will not be allowed to drive  home.   Please read over the following fact sheets that you were given:     __X__ Take these medicines the morning of surgery with A SIP OF WATER:    1. allopurinol (ZYLOPRIM) 300 MG tablet  2. diltiazem (CARDIZEM CD) 180 MG 24 hr capsule  3. gabapentin (NEURONTIN) 300 MG capsule  4. HYDROcodone-acetaminophen (NORCO/VICODIN) 5-325 MG tablet if needed  5.  6.  ____ Fleet Enema (as directed)   ____ Use CHG Soap/SAGE wipes as directed  ____ Use inhalers on the day of surgery  ____ Stop metformin/Janumet/Farxiga 2 days prior to surgery    ____ Take 1/2 of usual insulin dose the night before surgery. No insulin the morning          of surgery.   ____ Stop Blood Thinners Coumadin/Plavix/Xarelto/Pleta/Pradaxa/Eliquis/Effient/Aspirin  on   Or contact your Surgeon, Cardiologist or Medical Doctor regarding  ability to stop your blood thinners  __X__ Stop Anti-inflammatories 7 days before surgery such as Advil, Ibuprofen, Motrin,  BC or Goodies Powder, Naprosyn, Naproxen, Aleve, Aspirin    __X__ Stop all herbal supplements, fish oil or vitamin E until after surgery.    ____ Bring C-Pap to the hospital.     Follow instructions you were given about your Eliquis  from Dr Cherrie Gauze office

## 2021-03-25 NOTE — Telephone Encounter (Signed)
Called pt and asked how he did overnight.wanted to know if he tried taking food before taking the oxycodone. The pt. States that he was so sick from trying it twice that he did not want the torture to try it again. He took the norco and it helped his pain for about 1 hour. He wants to stay with norco; but just get higher dose. I called Janese Banks and she is agreeable and I put in rx for the new dose norco that pt was unable to tolerate the oxycodone and needs inc. norco dose to help with pain. Pt aware of this change and he will need to go to pharmacy

## 2021-03-26 ENCOUNTER — Ambulatory Visit: Payer: PPO | Admitting: Urgent Care

## 2021-03-26 ENCOUNTER — Ambulatory Visit
Admission: RE | Admit: 2021-03-26 | Discharge: 2021-03-26 | Disposition: A | Discharge status: Home / Self Care | Payer: PPO | Attending: Urology | Admitting: Urology

## 2021-03-26 ENCOUNTER — Other Ambulatory Visit: Payer: Self-pay

## 2021-03-26 ENCOUNTER — Encounter: Payer: Self-pay | Admitting: Urology

## 2021-03-26 ENCOUNTER — Encounter
Admission: RE | Disposition: A | Discharge status: Home / Self Care | Payer: Self-pay | Source: Home / Self Care | Attending: Urology

## 2021-03-26 ENCOUNTER — Ambulatory Visit: Payer: PPO

## 2021-03-26 ENCOUNTER — Ambulatory Visit: Payer: PPO | Admitting: Anesthesiology

## 2021-03-26 DIAGNOSIS — N1330 Unspecified hydronephrosis: Secondary | ICD-10-CM | POA: Diagnosis not present

## 2021-03-26 DIAGNOSIS — Z91040 Latex allergy status: Secondary | ICD-10-CM | POA: Insufficient documentation

## 2021-03-26 DIAGNOSIS — C61 Malignant neoplasm of prostate: Secondary | ICD-10-CM | POA: Diagnosis not present

## 2021-03-26 DIAGNOSIS — Z9079 Acquired absence of other genital organ(s): Secondary | ICD-10-CM | POA: Insufficient documentation

## 2021-03-26 DIAGNOSIS — N179 Acute kidney failure, unspecified: Secondary | ICD-10-CM | POA: Diagnosis not present

## 2021-03-26 DIAGNOSIS — Z79899 Other long term (current) drug therapy: Secondary | ICD-10-CM | POA: Diagnosis not present

## 2021-03-26 DIAGNOSIS — Z950 Presence of cardiac pacemaker: Secondary | ICD-10-CM | POA: Diagnosis not present

## 2021-03-26 DIAGNOSIS — C7951 Secondary malignant neoplasm of bone: Secondary | ICD-10-CM | POA: Diagnosis not present

## 2021-03-26 DIAGNOSIS — Z96643 Presence of artificial hip joint, bilateral: Secondary | ICD-10-CM | POA: Insufficient documentation

## 2021-03-26 DIAGNOSIS — M109 Gout, unspecified: Secondary | ICD-10-CM | POA: Diagnosis not present

## 2021-03-26 DIAGNOSIS — Z7901 Long term (current) use of anticoagulants: Secondary | ICD-10-CM | POA: Diagnosis not present

## 2021-03-26 DIAGNOSIS — Z96653 Presence of artificial knee joint, bilateral: Secondary | ICD-10-CM | POA: Diagnosis not present

## 2021-03-26 DIAGNOSIS — Z8673 Personal history of transient ischemic attack (TIA), and cerebral infarction without residual deficits: Secondary | ICD-10-CM | POA: Diagnosis not present

## 2021-03-26 DIAGNOSIS — C7989 Secondary malignant neoplasm of other specified sites: Secondary | ICD-10-CM | POA: Diagnosis not present

## 2021-03-26 HISTORY — PX: CYSTOSCOPY W/ RETROGRADES: SHX1426

## 2021-03-26 HISTORY — PX: CYSTOSCOPY W/ URETERAL STENT PLACEMENT: SHX1429

## 2021-03-26 SURGERY — CYSTOSCOPY, FLEXIBLE, WITH STENT REPLACEMENT
Anesthesia: General | Site: Ureter | Laterality: Right

## 2021-03-26 MED ORDER — PROPOFOL 10 MG/ML IV BOLUS
INTRAVENOUS | Status: DC | PRN
  Administered 2021-03-26: 30 mg via INTRAVENOUS
  Administered 2021-03-26: 150 mg via INTRAVENOUS

## 2021-03-26 MED ORDER — LACTATED RINGERS IV SOLN: INTRAVENOUS | Status: DC

## 2021-03-26 MED ORDER — ONDANSETRON HCL 4 MG/2ML IJ SOLN
INTRAMUSCULAR | Status: DC | PRN
  Administered 2021-03-26: 4 mg via INTRAVENOUS

## 2021-03-26 MED ORDER — LIDOCAINE HCL (CARDIAC) PF 100 MG/5ML IV SOSY
PREFILLED_SYRINGE | INTRAVENOUS | Status: DC | PRN
  Administered 2021-03-26: 100 mg via INTRAVENOUS

## 2021-03-26 MED ORDER — FENTANYL CITRATE (PF) 100 MCG/2ML IJ SOLN
INTRAMUSCULAR | Status: DC | PRN
  Administered 2021-03-26: 50 ug via INTRAVENOUS
  Administered 2021-03-26 (×2): 25 ug via INTRAVENOUS

## 2021-03-26 MED ORDER — FENTANYL CITRATE (PF) 100 MCG/2ML IJ SOLN: 25.0000 ug | INTRAMUSCULAR | Status: DC | PRN

## 2021-03-26 MED ORDER — IOHEXOL 180 MG/ML  SOLN
INTRAMUSCULAR | Status: DC | PRN
  Administered 2021-03-26: 20 mL

## 2021-03-26 MED ORDER — ONDANSETRON HCL 4 MG/2ML IJ SOLN
INTRAMUSCULAR | Status: AC
  Filled 2021-03-26: qty 2

## 2021-03-26 MED ORDER — FENTANYL CITRATE (PF) 100 MCG/2ML IJ SOLN
INTRAMUSCULAR | Status: AC
  Filled 2021-03-26: qty 2

## 2021-03-26 MED ORDER — CEFAZOLIN SODIUM-DEXTROSE 2-4 GM/100ML-% IV SOLN
2.0000 g | INTRAVENOUS | Status: AC
  Administered 2021-03-26: 2 g via INTRAVENOUS

## 2021-03-26 MED ORDER — HYDROCODONE-ACETAMINOPHEN 7.5-325 MG PO TABS
ORAL_TABLET | ORAL | Status: AC
  Filled 2021-03-26: qty 1

## 2021-03-26 MED ORDER — FAMOTIDINE 20 MG PO TABS
ORAL_TABLET | ORAL | Status: AC
  Administered 2021-03-26: 20 mg via ORAL
  Filled 2021-03-26: qty 1

## 2021-03-26 MED ORDER — FAMOTIDINE 20 MG PO TABS: 20.0000 mg | ORAL_TABLET | Freq: Once | ORAL | Status: AC

## 2021-03-26 MED ORDER — CEFAZOLIN SODIUM-DEXTROSE 2-4 GM/100ML-% IV SOLN
INTRAVENOUS | Status: AC
  Filled 2021-03-26: qty 100

## 2021-03-26 MED ORDER — ORAL CARE MOUTH RINSE: 15.0000 mL | Freq: Once | OROMUCOSAL | Status: AC

## 2021-03-26 MED ORDER — DEXAMETHASONE SODIUM PHOSPHATE 10 MG/ML IJ SOLN
INTRAMUSCULAR | Status: DC | PRN
  Administered 2021-03-26: 10 mg via INTRAVENOUS

## 2021-03-26 MED ORDER — ONDANSETRON HCL 4 MG/2ML IJ SOLN
4.0000 mg | Freq: Once | INTRAMUSCULAR | Status: AC | PRN
  Administered 2021-03-26: 4 mg via INTRAVENOUS

## 2021-03-26 MED ORDER — HYDROCODONE-ACETAMINOPHEN 7.5-325 MG PO TABS
1.0000 | ORAL_TABLET | Freq: Once | ORAL | Status: AC | PRN
  Administered 2021-03-26: 1 via ORAL

## 2021-03-26 MED ORDER — ACETAMINOPHEN 160 MG/5ML PO SOLN
325.0000 mg | ORAL | Status: DC | PRN
  Filled 2021-03-26: qty 20.3

## 2021-03-26 MED ORDER — CHLORHEXIDINE GLUCONATE 0.12 % MT SOLN
15.0000 mL | Freq: Once | OROMUCOSAL | Status: AC
  Administered 2021-03-26: 15 mL via OROMUCOSAL

## 2021-03-26 MED ORDER — ACETAMINOPHEN 325 MG PO TABS: 325.0000 mg | ORAL_TABLET | ORAL | Status: DC | PRN

## 2021-03-26 SURGICAL SUPPLY — 22 items
ADAPTER SCOPE UROLOK II (MISCELLANEOUS) ×1 IMPLANT
ADPR INSRT BALL FIT URLK2 (MISCELLANEOUS) ×2
BAG DRAIN CYSTO-URO LG1000N (MISCELLANEOUS) ×3 IMPLANT
BRUSH SCRUB EZ 1% IODOPHOR (MISCELLANEOUS) ×3 IMPLANT
CATH FOLEY 2WAY SIL 16X30 (CATHETERS) ×1 IMPLANT
CATH URETL 5X70 OPEN END (CATHETERS) ×3 IMPLANT
DRAPE UTILITY 15X26 TOWEL STRL (DRAPES) ×3 IMPLANT
GLOVE SURG ENC MOIS LTX SZ6.5 (GLOVE) ×3 IMPLANT
GOWN STRL REUS W/ TWL LRG LVL3 (GOWN DISPOSABLE) ×4 IMPLANT
GOWN STRL REUS W/TWL LRG LVL3 (GOWN DISPOSABLE) ×6
GUIDEWIRE STR DUAL SENSOR (WIRE) ×3 IMPLANT
IV NS IRRIG 3000ML ARTHROMATIC (IV SOLUTION) ×3 IMPLANT
KIT TURNOVER CYSTO (KITS) ×3 IMPLANT
PACK CYSTO AR (MISCELLANEOUS) ×3 IMPLANT
SET CYSTO W/LG BORE CLAMP LF (SET/KITS/TRAYS/PACK) ×3 IMPLANT
STENT URET 6FRX24 CONTOUR (STENTS) IMPLANT
STENT URET 6FRX26 CONTOUR (STENTS) IMPLANT
STENT URO INLAY 6FRX24CM (STENTS) ×1 IMPLANT
SURGILUBE 2OZ TUBE FLIPTOP (MISCELLANEOUS) ×3 IMPLANT
SYR TOOMEY IRRIG 70ML (MISCELLANEOUS) ×3
SYRINGE TOOMEY IRRIG 70ML (MISCELLANEOUS) ×2 IMPLANT
WATER STERILE IRR 1000ML POUR (IV SOLUTION) ×3 IMPLANT

## 2021-03-26 NOTE — Transfer of Care (Signed)
Immediate Anesthesia Transfer of Care Note  Patient: Jeremy Carney  Procedure(s) Performed: CYSTOSCOPY WITH STENT PLACEMENT (Right Ureter) CYSTOSCOPY WITH RETROGRADE PYELOGRAM (Bilateral Ureter)  Patient Location: PACU  Anesthesia Type:General  Level of Consciousness: awake and sedated  Airway & Oxygen Therapy: Patient Spontanous Breathing and Patient connected to face mask oxygen  Post-op Assessment: Report given to RN and Post -op Vital signs reviewed and stable  Post vital signs: Reviewed and stable  Last Vitals:  Vitals Value Taken Time  BP 109/72 03/26/21 1003  Temp    Pulse 88 03/26/21 1006  Resp 24 03/26/21 1006  SpO2 92 % 03/26/21 1006  Vitals shown include unvalidated device data.  Last Pain:  Vitals:   03/26/21 0827  TempSrc: Temporal  PainSc: 0-No pain         Complications: No complications documented.

## 2021-03-26 NOTE — Progress Notes (Signed)
Oked per MD Erlene Quan for pt to resume his Eliquis tonight. Continue to monitor.

## 2021-03-26 NOTE — Anesthesia Preprocedure Evaluation (Signed)
Anesthesia Evaluation  Patient identified by MRN, date of birth, ID band Patient awake    Reviewed: Allergy & Precautions, H&P , NPO status , reviewed documented beta blocker date and time   Airway Mallampati: II  TM Distance: >3 FB Neck ROM: limited    Dental  (+) Poor Dentition, Chipped, Missing Multiple missing teeth, denies any loose teeth:   Pulmonary    Pulmonary exam normal        Cardiovascular Normal cardiovascular exam+ dysrhythmias + pacemaker   Hx of EF 35% 2015   Neuro/Psych  Neuromuscular disease CVA    GI/Hepatic neg GERD  ,  Endo/Other    Renal/GU      Musculoskeletal  (+) Arthritis ,   Abdominal   Peds  Hematology   Anesthesia Other Findings Past Medical History: 10/30/2015: Arthritis, degenerative 10/30/2015: Atrial fibrillation ([place]) 06/12/2013: Bladder spasm 10/30/2015: Carcinoma of prostate ([employer]) 09/10/2014: Cardiomyopathy ([place])     Comment:  Overview:  EF 35%,4/15  10/30/2015: Cerebrovascular accident (CVA) ([place])     Comment:  Overview:  Left embolic TDV,7616  07/37/1062: Difficult or painful urination No date: Dysrhythmia 10/30/2015: Gout No date: History of kidney stones 09/05/2012: Malignant neoplasm of prostate ([place]) No date: Presence of permanent cardiac pacemaker 10/30/2015: Sinoatrial node dysfunction ([place])     Comment:  Overview:  DDD pacemaker, 1996  Past Surgical History: 10/2009: castration 2016: CATARACT EXTRACTION W/ INTRAOCULAR LENS  IMPLANT, BILATERAL;  Bilateral     Comment:  right eye done then the left one done 3 months later in               2016 11/21/2019: COLONOSCOPY WITH PROPOFOL; N/A     Comment:  Procedure: COLONOSCOPY WITH PROPOFOL;  Surgeon: Robert Bellow, MD;  Location: [facility] ENDOSCOPY;  Service:               Endoscopy;  Laterality: N/A; No date: INGUINAL HERNIA REPAIR; Left No date: INSERT / REPLACE / REMOVE PACEMAKER 07/01/2017: IR FLUORO  GUIDE PORT INSERTION RIGHT 06/20/2020: IR REMOVAL TUN ACCESS W/ PORT W/O FL MOD SED 08/01/2020: IR REMOVAL TUN CV CATH W/O FL 1980: JOINT REPLACEMENT; Bilateral     Comment:  hips 2014: left knee replacement No date: PACEMAKER INSERTION 09/11/2020: PORTA CATH INSERTION; N/A     Comment:  Procedure: PORTA CATH INSERTION;  Surgeon: Algernon Huxley,              MD;  Location: [place] [facility];  Service:               Cardiovascular;  Laterality: N/A; No date: PROSTATECTOMY 1997: REPLACEMENT TOTAL HIP W/  RESURFACING IMPLANTS     Comment:  bilateral-  No date: REPLACEMENT TOTAL KNEE; Bilateral No date: testes removal     Comment:  per pt  No date: TONSILLECTOMY AND ADENOIDECTOMY 12/31/2015: TRANSURETHRAL RESECTION OF BLADDER TUMOR; N/A     Comment:  Procedure: BLADDER BIOPSY;  Surgeon: Nickie Retort,              MD;  Location: [facility] ORS;  Service: Urology;  Laterality:               N/A; No date: Urinary incontinence valve     Comment:  AMS   Reproductive/Obstetrics  Anesthesia Physical Anesthesia Plan  ASA: III  Anesthesia Plan: General LMA   Post-op Pain Management:    Induction: Intravenous  PONV Risk Score and Plan: Ondansetron  Airway Management Planned: LMA  Additional Equipment:   Intra-op Plan:   Post-operative Plan: Extubation in OR  Informed Consent: I have reviewed the patients History and Physical, chart, labs and discussed the procedure including the risks, benefits and alternatives for the proposed anesthesia with the patient or authorized representative who has indicated his/her understanding and acceptance.     Dental Advisory Given  Plan Discussed with: CRNA  Anesthesia Plan Comments:         Anesthesia Quick Evaluation

## 2021-03-26 NOTE — Interval H&P Note (Signed)
History and Physical Interval Note:  03/26/2021 8:56 AM  Jeremy Carney  has presented today for surgery, with the diagnosis of Right hydronephrosis.  The various methods of treatment have been discussed with the patient and family. After consideration of risks, benefits and other options for treatment, the patient has consented to  Procedure(s): CYSTOSCOPY WITH STENT REPLACEMENT (Right) CYSTOSCOPY WITH RETROGRADE PYELOGRAM (Bilateral) as a surgical intervention.  The patient's history has been reviewed, patient examined, no change in status, stable for surgery.  I have reviewed the patient's chart and labs.  Questions were answered to the patient's satisfaction.    RRR CTAB   Hollice Espy

## 2021-03-26 NOTE — Op Note (Signed)
Date of procedure: 03/26/21  Preoperative diagnosis:  1. Metastatic castrate resistant prostate cancer 2. Right hydronephrosis 3. Acute kidney injury  Postoperative diagnosis:  1. Same as above  Procedure: 1. Cystoscopy 2. Bilateral retrograde pyelogram 3. Right ureteral stent placement  Surgeon: Hollice Espy, MD  Anesthesia: General  Complications: None  Intraoperative findings: Tortuous buried phallus necessitating flexible cystoscopy.  AUS appears to be functioning properly.  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  Left retrograde unremarkable.  Stent placed without difficulty.  EBL: Minimal  Specimens: None  Drains: 6 x 24 French double-J ureteral stent on right, Bard Optima  Indication: Jeremy Carney is a 75 y.o. patient with personal history of metastatic castrate resistant prostate cancer with new right hydronephrosis of unclear etiology and worsening renal function.  After reviewing the management options for treatment, he elected to proceed with the above surgical procedure(s). We have discussed the potential benefits and risks of the procedure, side effects of the proposed treatment, the likelihood of the patient achieving the goals of the procedure, and any potential problems that might occur during the procedure or recuperation. Informed consent has been obtained.  Description of procedure:  The patient was taken to the operating room and general anesthesia was induced.  The patient was placed in the dorsal lithotomy position, prepped and draped in the usual sterile fashion, and preoperative antibiotics were administered. A preoperative time-out was performed.   The phallus was relatively buried and required a good amount of manipulation in order to locate the meatus.  Additional Betadine was applied after the glans was exposed.  The AUS cuff was permanently  deactivated by completely decompressing the button and pressing the notch.  It remained deactivated.  I initially tried a 35 French semirigid ureteroscope but due to the tortuosity of the urethra as well as fairly narrow meatus, elected to use a 16 French flexible cystoscope which was able to be more easily navigated.  Within the bulbar urethra, the AUS cuff impression could be seen without evidence of erosion which was patent after deactivation.  I was able to advance into the bladder.  Notably, the prostate was surgically absent.  The bladder was inspected noted to be completely normal.  Attention was turned to the right ureteral orifice.  I cannulated using a sensor wire up to the kidney and ultimately remove the flexible cystoscope.  I then advanced a 5 [place] open-ended ureteral catheters to level the mid ureter removing the wire.  I shot a retrograde pyelogram which showed massively dilated renal pelvis with some kinking and tortuosity in the proximal ureter with an abrupt transition.  There is no filling defects within the lumen to indicate obstruction within the ureter.  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  I then replaced the wire up to the level of the kidney.  Under fluoroscopy, I advanced a Bard Optima 6 x 24 French double-J ureteral stent up to level the renal pelvis.  The wire ultimately was able to be withdrawn and a full coil was noted both within the bladder as well as in the renal pelvis.  I then reintroduced the flexible cystoscope and use the same technique on the left by cannulating the UO and then using a 5 [place] open-ended ureteral catheter just within the UO to do a retrograde pyelogram on this side.  The side  was decompressed with some mild dilation down to the level of the bladder but homogeneous without hydronephrosis.  It drained promptly.  Finally, a flexible catheter was used to drain the bladder.  Patient was  cleaned and dried.  I then permanently reactivated his cough and cycled it a few times to ensure that it is functioning properly which it was.  He was then reversed from anesthesia, taken the PACU in stable condition.  Intraoperative findings were discussed with his wife.  Plan: We will have him return in 2 weeks with BMP only to see if this improves his renal function.  He has follow-up with Dr. Janese Banks next month for repeat imaging which I will also follow-up with.  We will see him in 3 to 4 months to discuss stent exchange depending on how well the stent functions.  He is not given any additional prescriptions as he currently has narcotic.  If he does have issues tolerating the stent, he can come by the office and pick up some samples of Myrbetriq.  Hollice Espy, M.D.

## 2021-03-26 NOTE — Anesthesia Postprocedure Evaluation (Signed)
Anesthesia Post Note  Patient: Jeremy Carney  Procedure(s) Performed: CYSTOSCOPY WITH STENT PLACEMENT (Right Ureter) CYSTOSCOPY WITH RETROGRADE PYELOGRAM (Bilateral Ureter)  Patient location during evaluation: PACU Anesthesia Type: General Level of consciousness: awake and alert Pain management: pain level controlled Vital Signs Assessment: post-procedure vital signs reviewed and stable Respiratory status: spontaneous breathing, nonlabored ventilation and respiratory function stable Cardiovascular status: blood pressure returned to baseline and stable Postop Assessment: no apparent nausea or vomiting Anesthetic complications: no   No complications documented.   Last Vitals:  Vitals:   03/26/21 1030 03/26/21 1058  BP: (!) 132/96 129/80  Pulse: 71 82  Resp: 14 18  Temp:  (!) 36.2 C  SpO2: 97% 98%    Last Pain:  Vitals:   03/26/21 1058  TempSrc: Temporal  PainSc: 5                  Ailie Gage Harvie Heck

## 2021-03-26 NOTE — Discharge Instructions (Signed)
You have a ureteral stent in place.  This is a tube that extends from your kidney to your bladder.  This may cause urinary bleeding, burning with urination, and urinary frequency.  Please call our office or present to the ED if you develop fevers >101 or pain which is not able to be controlled with oral pain medications.  You may be given either Flomax and/ or ditropan to help with bladder spasms and stent pain in addition to pain medications.   ° °[facility] °[address], [address] °[place], [place] [address] °[phone] ° ° ° °AMBULATORY SURGERY  °DISCHARGE INSTRUCTIONS ° ° °1) The drugs that you were given will stay in your system until tomorrow so for the next 24 hours you should not: ° °A) Drive an automobile °B) Make any legal decisions °C) Drink any alcoholic beverage ° ° °2) You may resume regular meals tomorrow.  Today it is better to start with liquids and gradually work up to solid foods. ° °You may eat anything you prefer, but it is better to start with liquids, then soup and crackers, and gradually work up to solid foods. ° ° °3) Please notify your doctor immediately if you have any unusual bleeding, trouble breathing, redness and pain at the surgery site, drainage, fever, or pain not relieved by medication. ° ° ° °4) Additional Instructions: ° ° ° ° ° ° ° °Please contact your physician with any problems or Same Day Surgery at [phone], Monday through Friday 6 am to 4 pm, or [place] [facility] [place] Main number at [phone]. °

## 2021-03-26 NOTE — Anesthesia Procedure Notes (Signed)
Procedure Name: LMA Insertion Performed by: Nelda Marseille, CRNA Pre-anesthesia Checklist: Patient identified, Patient being monitored, Timeout performed, Emergency Drugs available and Suction available Patient Re-evaluated:Patient Re-evaluated prior to induction Oxygen Delivery Method: Circle system utilized Preoxygenation: Pre-oxygenation with 100% oxygen Induction Type: IV induction Ventilation: Mask ventilation without difficulty LMA: LMA inserted LMA Size: 4.0 Tube type: Oral Number of attempts: 1 Placement Confirmation: positive ETCO2 and breath sounds checked- equal and bilateral Tube secured with: Tape Dental Injury: Teeth and Oropharynx as per pre-operative assessment

## 2021-03-27 ENCOUNTER — Encounter: Payer: Self-pay | Admitting: Urology

## 2021-03-31 ENCOUNTER — Other Ambulatory Visit: Payer: PPO

## 2021-03-31 ENCOUNTER — Encounter: Payer: Self-pay | Admitting: Oncology

## 2021-03-31 ENCOUNTER — Inpatient Hospital Stay: Payer: PPO

## 2021-03-31 ENCOUNTER — Inpatient Hospital Stay: Payer: PPO | Attending: Oncology

## 2021-03-31 ENCOUNTER — Encounter: Payer: Self-pay | Admitting: Licensed Clinical Social Worker

## 2021-03-31 ENCOUNTER — Inpatient Hospital Stay (HOSPITAL_BASED_OUTPATIENT_CLINIC_OR_DEPARTMENT_OTHER): Payer: PPO | Admitting: Oncology

## 2021-03-31 ENCOUNTER — Ambulatory Visit: Payer: PPO

## 2021-03-31 ENCOUNTER — Inpatient Hospital Stay (HOSPITAL_BASED_OUTPATIENT_CLINIC_OR_DEPARTMENT_OTHER): Payer: PPO | Admitting: Licensed Clinical Social Worker

## 2021-03-31 VITALS — BP 124/78 | HR 75 | Temp 98.2°F | Resp 20 | Wt 259.0 lb

## 2021-03-31 DIAGNOSIS — T451X5D Adverse effect of antineoplastic and immunosuppressive drugs, subsequent encounter: Secondary | ICD-10-CM | POA: Insufficient documentation

## 2021-03-31 DIAGNOSIS — C779 Secondary and unspecified malignant neoplasm of lymph node, unspecified: Secondary | ICD-10-CM | POA: Insufficient documentation

## 2021-03-31 DIAGNOSIS — G893 Neoplasm related pain (acute) (chronic): Secondary | ICD-10-CM | POA: Diagnosis not present

## 2021-03-31 DIAGNOSIS — Z9079 Acquired absence of other genital organ(s): Secondary | ICD-10-CM | POA: Diagnosis not present

## 2021-03-31 DIAGNOSIS — C772 Secondary and unspecified malignant neoplasm of intra-abdominal lymph nodes: Secondary | ICD-10-CM | POA: Diagnosis not present

## 2021-03-31 DIAGNOSIS — R972 Elevated prostate specific antigen [PSA]: Secondary | ICD-10-CM | POA: Diagnosis not present

## 2021-03-31 DIAGNOSIS — R109 Unspecified abdominal pain: Secondary | ICD-10-CM | POA: Insufficient documentation

## 2021-03-31 DIAGNOSIS — G620 Drug-induced polyneuropathy: Secondary | ICD-10-CM | POA: Diagnosis not present

## 2021-03-31 DIAGNOSIS — Z5181 Encounter for therapeutic drug level monitoring: Secondary | ICD-10-CM

## 2021-03-31 DIAGNOSIS — C7951 Secondary malignant neoplasm of bone: Secondary | ICD-10-CM | POA: Insufficient documentation

## 2021-03-31 DIAGNOSIS — C61 Malignant neoplasm of prostate: Secondary | ICD-10-CM

## 2021-03-31 DIAGNOSIS — Z79899 Other long term (current) drug therapy: Secondary | ICD-10-CM

## 2021-03-31 DIAGNOSIS — E860 Dehydration: Secondary | ICD-10-CM

## 2021-03-31 LAB — CBC WITH DIFFERENTIAL/PLATELET
Abs Immature Granulocytes: 0.01 10*3/uL (ref 0.00–0.07)
Basophils Absolute: 0 10*3/uL (ref 0.0–0.1)
Basophils Relative: 1 %
Eosinophils Absolute: 0.2 10*3/uL (ref 0.0–0.5)
Eosinophils Relative: 4 %
HCT: 31.9 % — ABNORMAL LOW (ref 39.0–52.0)
Hemoglobin: 10.5 g/dL — ABNORMAL LOW (ref 13.0–17.0)
Immature Granulocytes: 0 %
Lymphocytes Relative: 19 %
Lymphs Abs: 0.9 10*3/uL (ref 0.7–4.0)
MCH: 29.7 pg (ref 26.0–34.0)
MCHC: 32.9 g/dL (ref 30.0–36.0)
MCV: 90.4 fL (ref 80.0–100.0)
Monocytes Absolute: 0.4 10*3/uL (ref 0.1–1.0)
Monocytes Relative: 9 %
Neutro Abs: 3.1 10*3/uL (ref 1.7–7.7)
Neutrophils Relative %: 67 %
Platelets: 145 10*3/uL — ABNORMAL LOW (ref 150–400)
RBC: 3.53 MIL/uL — ABNORMAL LOW (ref 4.22–5.81)
RDW: 16.5 % — ABNORMAL HIGH (ref 11.5–15.5)
WBC: 4.7 10*3/uL (ref 4.0–10.5)
nRBC: 0 % (ref 0.0–0.2)

## 2021-03-31 LAB — COMPREHENSIVE METABOLIC PANEL
ALT: 12 U/L (ref 0–44)
AST: 31 U/L (ref 15–41)
Albumin: 3.6 g/dL (ref 3.5–5.0)
Alkaline Phosphatase: 131 U/L — ABNORMAL HIGH (ref 38–126)
Anion gap: 8 (ref 5–15)
BUN: 21 mg/dL (ref 8–23)
CO2: 25 mmol/L (ref 22–32)
Calcium: 9.6 mg/dL (ref 8.9–10.3)
Chloride: 104 mmol/L (ref 98–111)
Creatinine, Ser: 1.1 mg/dL (ref 0.61–1.24)
GFR, Estimated: >60 mL/min (ref >60)
Glucose, Bld: 107 mg/dL — ABNORMAL HIGH (ref 70–99)
Potassium: 5.2 mmol/L — ABNORMAL HIGH (ref 3.5–5.1)
Sodium: 137 mmol/L (ref 135–145)
Total Bilirubin: 1 mg/dL (ref 0.3–1.2)
Total Protein: 6.7 g/dL (ref 6.5–8.1)

## 2021-03-31 LAB — PSA: Prostatic Specific Antigen: 120.8 ng/mL — ABNORMAL HIGH (ref 0.00–4.00)

## 2021-03-31 MED ORDER — DENOSUMAB 120 MG/1.7ML ~~LOC~~ SOLN
120.0000 mg | SUBCUTANEOUS | Status: AC
  Administered 2021-03-31: 120 mg via SUBCUTANEOUS
  Filled 2021-03-31: qty 1.7

## 2021-03-31 NOTE — Progress Notes (Signed)
REFERRING PROVIDER: Sindy Guadeloupe, MD [place],  [place] [address]  PRIMARY PROVIDER:  Rusty Aus, MD  PRIMARY REASON FOR VISIT:  1. Malignant neoplasm of prostate ([place])      HISTORY OF PRESENT ILLNESS:   Mr. Berisha, a 75 y.o. male, was seen for a [place] cancer genetics consultation at the request of Dr. Janese Banks due to a personal history of metastatic prostate cancer.  Mr. Kenyon presents to clinic today to discuss the possibility of a hereditary predisposition to cancer, genetic testing, and to further clarify his future cancer risks, as well as potential cancer risks for family members.   In 1999 Mr. Casali was diagnosed with prostate cancer and had radical prostatectomy. He had orchiectomy in 2010 due to a rising PSA and then had biochemical recurrence in 2017, treated with radiation. Since then he has had chemotherapy and in 2022 was found to have bone and lymph node metastases.   Mr. Hatfield does report having had colonoscopies and believes he has had 3-4 total colon polyps.   CANCER HISTORY:  Oncology History  Malignant neoplasm of prostate ([place])  09/05/2012 Initial Diagnosis   Malignant neoplasm of prostate ([place])   06/27/2020 - 07/24/2020 Chemotherapy   The patient had sipuleucel-T (PROVENGE) infusion 250 mL, 250 mL, Intravenous,  Once, 3 of 3 cycles Administration: 250 mL (06/27/2020), 250 mL (07/10/2020), 250 mL (07/24/2020)  for chemotherapy treatment.    09/16/2020 -  Chemotherapy    Patient is on Treatment Plan: PROSTATE CABAZITAXEL + PREDNISONE Q21D      01/30/2021 Cancer Staging   Staging form: Prostate, AJCC 7th Edition - Clinical: Stage IV (M1) - Signed by Sindy Guadeloupe, MD on 01/30/2021 Biopsy of metastatic site performed: Yes Source of metastatic specimen: Distant Lymph Nodes, Bone   Carcinoma of prostate ([place])  10/30/2015 Initial Diagnosis   Carcinoma of prostate [facility])      Past Medical History:  Diagnosis Date  . Arthritis,  degenerative 10/30/2015  . Atrial fibrillation ([place]) 10/30/2015  . Bladder spasm 06/12/2013  . Carcinoma of prostate ([place]) 10/30/2015  . Cardiomyopathy ([place]) 09/10/2014   Overview:  EF 35%,4/15   . Cerebrovascular accident (CVA) ([place]) 10/30/2015   Overview:  Left embolic PIR,5188   . Difficult or painful urination 11/21/2013  . Dysrhythmia   . Gout 10/30/2015  . History of kidney stones   . Malignant neoplasm of prostate ([place]) 09/05/2012  . Presence of permanent cardiac pacemaker   . Sinoatrial node dysfunction ([facility]) 10/30/2015   Overview:  DDD pacemaker, 1996     Past Surgical History:  Procedure Laterality Date  . castration  10/2009  . CATARACT EXTRACTION W/ INTRAOCULAR LENS  IMPLANT, BILATERAL Bilateral 2016   right eye done then the left one done 3 months later in 2016  . COLONOSCOPY WITH PROPOFOL N/A 11/21/2019   Procedure: COLONOSCOPY WITH PROPOFOL;  Surgeon: Robert Bellow, MD;  Location: [facility] ENDOSCOPY;  Service: Endoscopy;  Laterality: N/A;  . CYSTOSCOPY W/ RETROGRADES Bilateral 03/26/2021   Procedure: CYSTOSCOPY WITH RETROGRADE PYELOGRAM;  Surgeon: Hollice Espy, MD;  Location: [facility] ORS;  Service: Urology;  Laterality: Bilateral;  . CYSTOSCOPY W/ URETERAL STENT PLACEMENT Right 03/26/2021   Procedure: CYSTOSCOPY WITH STENT PLACEMENT;  Surgeon: Hollice Espy, MD;  Location: [facility] ORS;  Service: Urology;  Laterality: Right;  . INGUINAL HERNIA REPAIR Left   . INSERT / REPLACE / REMOVE PACEMAKER    . IR FLUORO GUIDE PORT INSERTION RIGHT  07/01/2017  .  IR REMOVAL TUN ACCESS W/ PORT W/O FL MOD SED  06/20/2020  . IR REMOVAL TUN CV CATH W/O FL  08/01/2020  . JOINT REPLACEMENT Bilateral 1980   hips  . left knee replacement  2014  . PACEMAKER INSERTION    . PORTA CATH INSERTION N/A 09/11/2020   Procedure: PORTA CATH INSERTION;  Surgeon: Algernon Huxley, MD;  Location: [place] CV LAB;  Service: Cardiovascular;  Laterality: N/A;  . PROSTATECTOMY    . REPLACEMENT TOTAL HIP W/   RESURFACING IMPLANTS  1997   bilateral-   . REPLACEMENT TOTAL KNEE Bilateral   . testes removal     per pt   . TONSILLECTOMY AND ADENOIDECTOMY    . TRANSURETHRAL RESECTION OF BLADDER TUMOR N/A 12/31/2015   Procedure: BLADDER BIOPSY;  Surgeon: Nickie Retort, MD;  Location: [facility] ORS;  Service: Urology;  Laterality: N/A;  . Urinary incontinence valve     AMS    Social History   Socioeconomic History  . Marital status: Married    Spouse name: Not on file  . Number of children: Not on file  . Years of education: Not on file  . Highest education level: Not on file  Occupational History  . Not on file  Tobacco Use  . Smoking status: Never Smoker  . Smokeless tobacco: Never Used  Vaping Use  . Vaping Use: Never used  Substance and Sexual Activity  . Alcohol use: No    Alcohol/week: 0.0 standard drinks  . Drug use: No  . Sexual activity: Never  Other Topics Concern  . Not on file  Social History Narrative   Lives at home with spouse   Social Determinants of Health   Financial Resource Strain: Not on file  Food Insecurity: Not on file  Transportation Needs: Not on file  Physical Activity: Not on file  Stress: Not on file  Social Connections: Not on file     FAMILY HISTORY:  We obtained a detailed, 4-generation family history.  Significant diagnoses are listed below: Family History  Problem Relation Age of Onset  . Arthritis Mother   . Throat cancer Mother   . Heart attack Father   . Arthritis Father   . Transient ischemic attack Brother   . Prostate cancer Neg Hx   . Bladder Cancer Neg Hx    Mr. Stump has 2 sons, 2 daughters, 3 sisters, and 1 brother - none have had cancer.  Mr. Stoffers mother had throat cancer and passed in her 47s-80s, no history of smoking. No other known cancers on this side of the family.  Mr. Erbe father died at 79, no cancers. No known cancers on this side of the family.  Mr. Curro is unaware of previous family history of  genetic testing for hereditary cancer risks. Patient's maternal ancestors are of [place] descent, and paternal ancestors are of [place] descent. There is no reported Ashkenazi Jewish ancestry. There is no known consanguinity.    GENETIC COUNSELING ASSESSMENT: Mr. Balboa is a 75 y.o. male with a personal history of metastatic prostate cancer which is somewhat suggestive of a hereditary cancer syndrome and predisposition to cancer. We, therefore, discussed and recommended the following at today's visit.   DISCUSSION: We discussed that approximately 5-10% of prostate cancer is hereditary  Most cases of hereditary prostate cancer are associated with BRCA1/BRCA2 genes, although there are other genes associated with hereditary prostate cancer as well. We discussed that testing is beneficial for several reasons including knowing if  an individual is a candidate for certain targeted therapies, knowing about other cancer risks, identifying potential screening and risk-reduction options that may be appropriate, and to understand if other family members could be at risk for cancer and allow them to undergo genetic testing.   We reviewed the characteristics, features and inheritance patterns of hereditary cancer syndromes. We also discussed genetic testing, including the appropriate family members to test, the process of testing, insurance coverage and turn-around-time for results. We discussed the implications of a negative, positive and/or variant of uncertain significant result. We recommended Mr. Paquette pursue genetic testing for the [facility] Common Hereditary Cancers +RNA gene panel.   The Common Hereditary Cancers Panel + RNA offered by [facility] includes sequencing and/or deletion duplication testing of the following 47 genes: APC, ATM, AXIN2, BARD1, BMPR1A, BRCA1, BRCA2, BRIP1, CDH1, CDKN2A (p14ARF), CDKN2A (p16INK4a), CKD4, CHEK2, CTNNA1, DICER1, EPCAM (Deletion/duplication testing only), GREM1 (promoter region  deletion/duplication testing only), KIT, MEN1, MLH1, MSH2, MSH3, MSH6, MUTYH, NBN, NF1, NHTL1, PALB2, PDGFRA, PMS2, POLD1, POLE, PTEN, RAD50, RAD51C, RAD51D, SDHB, SDHC, SDHD, SMAD4, SMARCA4. STK11, TP53, TSC1, TSC2, and VHL.  The following genes were evaluated for sequence changes only: SDHA and HOXB13 c.251G>A variant only.  Based on Mr. Elza's personal history of cancer, he meets medical criteria for genetic testing. Despite that he meets criteria, he may still have an out of pocket cost. We discussed that if his out of pocket cost for testing is over $100, the laboratory will call and confirm whether he wants to proceed with testing.  If the out of pocket cost of testing is less than $100 he will be billed by the genetic testing laboratory.   PLAN: After considering the risks, benefits, and limitations, Mr. Valenza provided informed consent to pursue genetic testing and the blood sample was sent to [facility] for analysis of the Common Hereditary Cancers Panel+RNA. He will have blood drawn on 05/01/2021 for this. Results should be available within approximately 2-3 weeks' time, at which point they will be disclosed by telephone to Mr. Gates, as will any additional recommendations warranted by these results. Mr. Engram will receive a summary of his genetic counseling visit and a copy of his results once available. This information will also be available in Epic.   Mr. Keidel questions were answered to his satisfaction today. Our contact information was provided should additional questions or concerns arise. Thank you for the referral and allowing [place] to share in the care of your patient.   Faith Rogue, MS, [facility] Genetic Counselor [email or web address] Phone: [phone]  The patient was seen for a total of 15 minutes in face-to-face genetic counseling.  Dr. Grayland Ormond was available for discussion regarding this case.    _______________________________________________________________________ For Office Staff:  Number of people involved in session: 1 Was an Intern/ student involved with case: no

## 2021-04-01 ENCOUNTER — Telehealth: Payer: Self-pay | Admitting: Oncology

## 2021-04-01 DIAGNOSIS — Z0000 Encounter for general adult medical examination without abnormal findings: Secondary | ICD-10-CM | POA: Diagnosis not present

## 2021-04-01 DIAGNOSIS — F330 Major depressive disorder, recurrent, mild: Secondary | ICD-10-CM | POA: Diagnosis not present

## 2021-04-01 DIAGNOSIS — C61 Malignant neoplasm of prostate: Secondary | ICD-10-CM | POA: Diagnosis not present

## 2021-04-01 DIAGNOSIS — I700 Atherosclerosis of aorta: Secondary | ICD-10-CM | POA: Diagnosis not present

## 2021-04-01 DIAGNOSIS — I420 Dilated cardiomyopathy: Secondary | ICD-10-CM | POA: Diagnosis not present

## 2021-04-01 DIAGNOSIS — N1330 Unspecified hydronephrosis: Secondary | ICD-10-CM | POA: Insufficient documentation

## 2021-04-01 DIAGNOSIS — I4819 Other persistent atrial fibrillation: Secondary | ICD-10-CM | POA: Diagnosis not present

## 2021-04-01 DIAGNOSIS — M1A00X0 Idiopathic chronic gout, unspecified site, without tophus (tophi): Secondary | ICD-10-CM | POA: Diagnosis not present

## 2021-04-01 DIAGNOSIS — C774 Secondary and unspecified malignant neoplasm of inguinal and lower limb lymph nodes: Secondary | ICD-10-CM | POA: Diagnosis not present

## 2021-04-01 DIAGNOSIS — D696 Thrombocytopenia, unspecified: Secondary | ICD-10-CM | POA: Diagnosis not present

## 2021-04-01 DIAGNOSIS — Z125 Encounter for screening for malignant neoplasm of prostate: Secondary | ICD-10-CM | POA: Diagnosis not present

## 2021-04-01 DIAGNOSIS — E538 Deficiency of other specified B group vitamins: Secondary | ICD-10-CM | POA: Diagnosis not present

## 2021-04-01 DIAGNOSIS — G620 Drug-induced polyneuropathy: Secondary | ICD-10-CM | POA: Diagnosis not present

## 2021-04-01 DIAGNOSIS — C7951 Secondary malignant neoplasm of bone: Secondary | ICD-10-CM | POA: Diagnosis not present

## 2021-04-01 NOTE — Progress Notes (Signed)
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 . He again began to have a rising PSA in 2010 and underwent orchiectomy in 2010.He had biochemical recurrence in 2017 and was treated with IMRT  He underwent CT abdomen and pelvis in May 2018which showed abnormal periaortic and right common iliac lymph nodes. Index aortocaval lymph node 1.4 cm which was new as compared to February 2014 and suspicious for malignancy. Bone scan did not reveal any evidence of bony metastatic disease.PSA went up from 3.9 in January 20 18-7.8 in April 2018. 13.3 in July 2018 when docetaxel was initiated. Doubling time at that time was about 3 months  Docetaxel chemo initiated for CRPC with ln mets and rapid doubling PSA in august 2018.Patient completed 5 cycles of docetaxel on 09/27/2017. Cycle #6 was not given due to progressive fatigue and cytopenias as well as worsening neuropathy. Cycle 5 of docetaxel was dose reduced to 60 mg/m square.Patient never had normalization of his PSA following docetaxel and the lowest PSA after docetaxel was 5.64 in December 2018  In April 2019 patient noted to have rising PSAto 19.02and increase in the size of retroperitoneal LN. Patient therefore started second line zytiga.  Patient has remained on Zytiga since April 2019. The lowest PSA value on Zytiga was 6.1 in August 2020 and since then there has been a small  but steady increase in his PSA.Most recent imaging in November 2020 showed increase in the size of retroperitoneal lymph nodes from 0.7 cm to 1.1 cm. New periaortic lymph node 1.2 cm. Slight progression of sclerotic bone metastases involving the left scapula as well as T4 and T7. Most recent PSA on 03/07/2020 was 23.9.Given the subtle progression Zytiga was not changed at that time.  repat LN biopsy showed adenocarcinoma prostate. NGSPD-L1 1%. PTEN C388C (R130X)  Patient went to [facility] for second opinion and was recommended Provenge given the rise in PSA and mild radiological progression.Patient had disease progression on Provenge and switch to third line cabazitaxel.  Scans after 6 cycles of cabazitaxel showed stable disease   Interval history-patient reports occasional flank pain for which she is taking as needed hydrocodone.Reports ongoing fatigue.  Weight has remained stable.  ECOG PS- 1 Pain scale- 0 Opioid associated constipation- no  Review of systems- Review of Systems  Constitutional: Positive for malaise/fatigue. Negative for chills, fever and weight loss.  HENT: Negative for congestion, ear discharge and nosebleeds.   Eyes: Negative for blurred vision.  Respiratory: Negative for cough, hemoptysis, sputum production, shortness of breath and wheezing.   Cardiovascular: Negative for chest pain, palpitations, orthopnea and claudication.  Gastrointestinal: Negative for abdominal pain, blood in stool, constipation, diarrhea, heartburn, melena, nausea and vomiting.  Genitourinary: Negative for dysuria, flank pain, frequency, hematuria and urgency.       Left flank pain  Musculoskeletal: Negative for back pain, joint pain and myalgias.  Skin: Negative for rash.  Neurological: Negative for dizziness, tingling, focal weakness, seizures, weakness and headaches.  Endo/Heme/Allergies: Does not bruise/bleed easily.  Psychiatric/Behavioral: Negative for depression and suicidal ideas.  The patient does not have insomnia.       Allergies  Allergen Reactions  . Latex Rash    Sensitivity, not allergy.     Past Medical History:  Diagnosis Date  . Arthritis, degenerative 10/30/2015  . Atrial fibrillation ([place]) 10/30/2015  . Bladder spasm 06/12/2013  . Carcinoma of prostate ([place]) 10/30/2015  . Cardiomyopathy ([place]) 09/10/2014   Overview:  EF 35%,4/15   . Cerebrovascular accident (CVA) ([place]) 10/30/2015   Overview:  Left embolic [id number]   . Difficult or painful urination 11/21/2013  . Dysrhythmia   . Gout 10/30/2015  . History of kidney stones   . Malignant neoplasm of prostate ([place]) 09/05/2012  . Presence of permanent cardiac pacemaker   . Sinoatrial node dysfunction ([facility]) 10/30/2015   Overview:  DDD pacemaker, 1996      Past Surgical History:  Procedure Laterality Date  . castration  10/2009  . CATARACT EXTRACTION W/ INTRAOCULAR LENS  IMPLANT, BILATERAL Bilateral 2016   right eye done then the left one done 3 months later in 2016  . COLONOSCOPY WITH PROPOFOL N/A 11/21/2019   Procedure: COLONOSCOPY WITH PROPOFOL;  Surgeon: Robert Bellow, MD;  Location: [facility] ENDOSCOPY;  Service: Endoscopy;  Laterality: N/A;  . CYSTOSCOPY W/ RETROGRADES Bilateral 03/26/2021   Procedure: CYSTOSCOPY WITH RETROGRADE PYELOGRAM;  Surgeon: Hollice Espy, MD;  Location: [facility] ORS;  Service: Urology;  Laterality: Bilateral;  . CYSTOSCOPY W/ URETERAL STENT PLACEMENT Right 03/26/2021   Procedure: CYSTOSCOPY WITH STENT PLACEMENT;  Surgeon: Hollice Espy, MD;  Location: [facility] ORS;  Service: Urology;  Laterality: Right;  . INGUINAL HERNIA REPAIR Left   . INSERT / REPLACE / REMOVE PACEMAKER    . IR FLUORO GUIDE PORT INSERTION RIGHT  07/01/2017  . IR REMOVAL TUN ACCESS W/ PORT W/O FL MOD SED  06/20/2020  . IR REMOVAL TUN CV CATH W/O FL  08/01/2020  . JOINT REPLACEMENT Bilateral 1980   hips  . left knee replacement  2014  . PACEMAKER INSERTION    . PORTA CATH INSERTION N/A 09/11/2020    Procedure: PORTA CATH INSERTION;  Surgeon: Algernon Huxley, MD;  Location: [facility];  Service: Cardiovascular;  Laterality: N/A;  . PROSTATECTOMY    . REPLACEMENT TOTAL HIP W/  RESURFACING IMPLANTS  1997   bilateral-   . REPLACEMENT TOTAL KNEE Bilateral   . testes removal     per pt   . TONSILLECTOMY AND ADENOIDECTOMY    . TRANSURETHRAL RESECTION OF BLADDER TUMOR N/A 12/31/2015   Procedure: BLADDER BIOPSY;  Surgeon: Nickie Retort, MD;  Location: [facility] ORS;  Service: Urology;  Laterality: N/A;  . Urinary incontinence valve     AMS    Social History   Socioeconomic History  . Marital status: Married    Spouse name: Not on file  . Number of children: Not on file  . Years of education: Not on file  . Highest education level: Not on file  Occupational History  . Not on file  Tobacco Use  . Smoking status: Never Smoker  . Smokeless tobacco: Never Used  Vaping Use  . Vaping Use: Never used  Substance and Sexual Activity  . Alcohol use: No    Alcohol/week: 0.0 standard drinks  . Drug use: No  . Sexual activity: Never  Other Topics Concern  . Not on file  Social History Narrative   Lives at home with spouse   Social Determinants of  Health   Financial Resource Strain: Not on file  Food Insecurity: Not on file  Transportation Needs: Not on file  Physical Activity: Not on file  Stress: Not on file  Social Connections: Not on file  Intimate Partner Violence: Not on file    Family History  Problem Relation Age of Onset  . Arthritis Mother   . Throat cancer Mother   . Heart attack Father   . Arthritis Father   . Transient ischemic attack Brother   . Prostate cancer Neg Hx   . Bladder Cancer Neg Hx      Current Outpatient Medications:  .  acetaminophen (TYLENOL) 325 MG tablet, Take 650 mg by mouth 2 (two) times daily., Disp: , Rfl:  .  allopurinol (ZYLOPRIM) 300 MG tablet, Take 300 mg by mouth daily., Disp: , Rfl:  .  bisacodyl (DULCOLAX) 5 MG EC tablet,  Take 5 mg by mouth 2 (two) times daily as needed for moderate constipation., Disp: , Rfl:  .  calcium carbonate (OS-CAL) 600 MG TABS tablet, Take 600 mg by mouth 2 (two) times daily with a meal., Disp: , Rfl:  .  cyanocobalamin (,VITAMIN B-12,) 1000 MCG/ML injection, Inject 1,000 mcg into the muscle every 30 (thirty) days. , Disp: , Rfl:  .  diltiazem (CARDIZEM CD) 180 MG 24 hr capsule, Take 180 mg by mouth daily., Disp: , Rfl:  .  ELIQUIS 5 MG TABS tablet, Take 5 mg by mouth 2 (two) times daily., Disp: , Rfl:  .  furosemide (LASIX) 20 MG tablet, Take 20 mg by mouth 2 (two) times daily., Disp: , Rfl:  .  gabapentin (NEURONTIN) 300 MG capsule, Take 1 capsule (300 mg total) by mouth 3 (three) times daily., Disp: 90 capsule, Rfl: 3 .  HYDROcodone-acetaminophen (NORCO) 10-325 MG tablet, Take 1 tablet by mouth every 4 (four) hours as needed., Disp: 180 tablet, Rfl: 0 .  potassium chloride (KLOR-CON) 10 MEQ tablet, TAKE 2 TABLETS BY MOUTH ONCE DAILY (Patient taking differently: Take 20 mEq by mouth daily.), Disp: 60 tablet, Rfl: 2 .  simvastatin (ZOCOR) 40 MG tablet, Take 40 mg by mouth daily at 6 PM. , Disp: , Rfl:  .  lidocaine-prilocaine (EMLA) cream, Apply to affected area once (Patient not taking: No sig reported), Disp: 30 g, Rfl: 3 No current facility-administered medications for this visit.  Facility-Administered Medications Ordered in Other Visits:  .  0.9 %  sodium chloride infusion, , Intravenous, Once PRN, Sindy Guadeloupe, MD .  albuterol (PROVENTIL) (2.5 MG/3ML) 0.083% nebulizer solution 2.5 mg, 2.5 mg, Nebulization, Once PRN, Sindy Guadeloupe, MD .  EPINEPHrine (ADRENALIN) 1 MG/10ML injection 0.25 mg, 0.25 mg, Intravenous, Once PRN, Sindy Guadeloupe, MD .  EPINEPHrine (ADRENALIN) 1 MG/10ML injection 0.25 mg, 0.25 mg, Intravenous, Once PRN, Sindy Guadeloupe, MD  Physical exam:  Vitals:   03/31/21 1040  BP: 124/78  Pulse: 75  Resp: 20  Temp: 98.2 F (36.8 C)  TempSrc: Tympanic  SpO2: 96%   Weight: 259 lb (117.5 kg)   Physical Exam Constitutional:      Comments: Appears fatigued  Cardiovascular:     Rate and Rhythm: Normal rate and regular rhythm.     Heart sounds: Normal heart sounds.  Pulmonary:     Effort: Pulmonary effort is normal.     Breath sounds: Normal breath sounds.  Abdominal:     General: Bowel sounds are normal.     Palpations: Abdomen is soft.  Skin:  General: Skin is warm and dry.  Neurological:     Mental Status: He is alert and oriented to person, place, and time.      CMP Latest Ref Rng & Units 03/31/2021  Glucose 70 - 99 mg/dL 107(H)  BUN 8 - 23 mg/dL 21  Creatinine 0.61 - 1.24 mg/dL 1.10  Sodium 135 - 145 mmol/L 137  Potassium 3.5 - 5.1 mmol/L 5.2(H)  Chloride 98 - 111 mmol/L 104  CO2 22 - 32 mmol/L 25  Calcium 8.9 - 10.3 mg/dL 9.6  Total Protein 6.5 - 8.1 g/dL 6.7  Total Bilirubin 0.3 - 1.2 mg/dL 1.0  Alkaline Phos 38 - 126 U/L 131(H)  AST 15 - 41 U/L 31  ALT 0 - 44 U/L 12   CBC Latest Ref Rng & Units 03/31/2021  WBC 4.0 - 10.5 K/uL 4.7  Hemoglobin 13.0 - 17.0 g/dL 10.5(L)  Hematocrit 39.0 - 52.0 % 31.9(L)  Platelets 150 - 400 K/uL 145(L)    No images are attached to the encounter.  Ultrasound renal complete  Result Date: 03/17/2021 CLINICAL DATA:  Right hydronephrosis EXAM: RENAL / URINARY TRACT ULTRASOUND COMPLETE COMPARISON:  CT chest, abdomen, and pelvis 01/28/2021 FINDINGS: Right Kidney: Renal measurements: 10.4 x 4 x 3 x 5.3 cm = volume: 125 mL. Echogenicity within normal limits. Persistent moderate right hydronephrosis and hydroureter. Left Kidney: Renal measurements: 13.4 x 4.9 x 3.4 cm = volume: 117 mL. Echogenicity within normal limits. No mass or hydronephrosis visualized. 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 Bladder: Bladder is decompressed and cannot be evaluated. Other: None. IMPRESSION: Unchanged moderate right hydronephrosis and hydroureter. Electronically Signed   By: Miachel Roux M.D.   On: 03/17/2021 08:33   DG OR  UROLOGY CYSTO IMAGE ([facility] ONLY)  Result Date: 03/26/2021 There is no interpretation for this exam.  This order is for images obtained during a surgical procedure.  Please See "Surgeries" Tab for more information regarding the procedure.     Assessment and plan- Patient is a 75 y.o. 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   He is here for routine follow-up of prostate cancer  On his CT scan in February 2022 he was noted to have right-sided hydronephrosis.  Subsequently he had evidence of AKI and was seen by urology and underwent right ureteral stent placement.  Kidney functions have now normalized.   He reports some ongoing left flank pain which I suspect is due to his retroperitoneal adenopathy.His PSA is continuing to rise and is up to 120 today from a prior value of 75.  I will therefore move up his scans and obtain a CT abdomen and pelvis with contrast and bone scan sooner.  If there is evidence of progression I will switch him to [facility] which is his last treatment option.    Patient was also seen by Faith Rogue from genetics and will undergo genetic testing to see if he would qualify for a laparotomy if he was found to have BRCA mutation.  We did do NGS testing on his lymph node biopsy specimen which did not show any actionable mutations but RNA immune profiling analysis failed for the case.  I will consider a repeat retroperitoneal biopsy based on CT findings  He will keep his follow-up appointment with me next month as scheduled.  Counts otherwise okay to proceed with Xgeva today  Neoplasm related pain: Continue as needed hydrocodone  Visit Diagnosis 1. Encounter for monitoring denosumab therapy  2. Malignant neoplasm of prostate ([place])   3. 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 Metastasis to bone [facility])      Dr. Randa Evens, MD, MPH [facility] at [facility] [phone] 04/01/2021 10:07 AM

## 2021-04-01 NOTE — Telephone Encounter (Signed)
Spoke with patient's wife about rescheduling Bone Scan and CT scans for an earlier date per MD. She was agreeable to new appointment day and time of 4/28  With an arrival time of 9:30. Sending update AVS in the mail.

## 2021-04-07 ENCOUNTER — Other Ambulatory Visit: Payer: Self-pay | Admitting: *Deleted

## 2021-04-07 ENCOUNTER — Telehealth: Payer: Self-pay | Admitting: *Deleted

## 2021-04-07 NOTE — Telephone Encounter (Signed)
Patient called to tell us that Dr. Sabra Heck gave him meloxicam to take for his pain, also he gave [place] another medicine but he spelled it differently to different times we feel like it is venlafaxine but not sure because he said he talked to Dr. Sabra Heck today and the only thing in the message from Dr. Sabra Heck was regarding the meloxicam.  Spoke with Dr. Judithann Graves and she does not want the patient to take the meloxicam.  Patient already having some urinary issues and Dr. Erlene Quan is working on that.  Therefore the meloxicam can mess with the kidneys and we do not want any issues and we already have so patient has been advised not to use the meloxicam.  Dr. Judithann Graves is willing to give him a Duragesic 12 mcg patch for long-term pain, patient was told to put it on the fatty part of his body and a will have to change it every 3 days and make sure he changes the area where he puts the patch and rotate.  Told him for the first time it usually takes 19 to 21 hours for the medicine to kick in originally and after that it will stay in his system as long as he put a new one on every 72 hours.  I will send it to Dr. Janese Banks for her to do her fingerprint to approve it to go to [facility].  Patient will still continue to take the Norco as needed for breakthrough pain .patient is agreeable to this

## 2021-04-08 ENCOUNTER — Other Ambulatory Visit: Payer: PPO

## 2021-04-08 ENCOUNTER — Other Ambulatory Visit: Payer: Self-pay

## 2021-04-08 DIAGNOSIS — N1330 Unspecified hydronephrosis: Secondary | ICD-10-CM

## 2021-04-08 MED ORDER — FENTANYL 12 MCG/HR TD PT72: 1.0000 | MEDICATED_PATCH | TRANSDERMAL | 0 refills | Status: AC

## 2021-04-09 ENCOUNTER — Telehealth: Payer: Self-pay | Admitting: *Deleted

## 2021-04-09 ENCOUNTER — Ambulatory Visit
Admission: RE | Admit: 2021-04-09 | Discharge: 2021-04-09 | Disposition: A | Discharge status: Home / Self Care | Payer: PPO | Source: Ambulatory Visit | Attending: Oncology | Admitting: Oncology

## 2021-04-09 ENCOUNTER — Encounter
Admission: RE | Admit: 2021-04-09 | Discharge: 2021-04-09 | Disposition: A | Discharge status: Home / Self Care | Payer: PPO | Source: Ambulatory Visit | Attending: Oncology | Admitting: Oncology

## 2021-04-09 ENCOUNTER — Other Ambulatory Visit: Payer: Self-pay | Admitting: *Deleted

## 2021-04-09 DIAGNOSIS — K5730 Diverticulosis of large intestine without perforation or abscess without bleeding: Secondary | ICD-10-CM | POA: Diagnosis not present

## 2021-04-09 DIAGNOSIS — S22080A Wedge compression fracture of T11-T12 vertebra, initial encounter for closed fracture: Secondary | ICD-10-CM | POA: Diagnosis not present

## 2021-04-09 DIAGNOSIS — C61 Malignant neoplasm of prostate: Secondary | ICD-10-CM

## 2021-04-09 DIAGNOSIS — I2510 Atherosclerotic heart disease of native coronary artery without angina pectoris: Secondary | ICD-10-CM | POA: Diagnosis not present

## 2021-04-09 DIAGNOSIS — C7951 Secondary malignant neoplasm of bone: Secondary | ICD-10-CM

## 2021-04-09 DIAGNOSIS — Z8546 Personal history of malignant neoplasm of prostate: Secondary | ICD-10-CM | POA: Diagnosis not present

## 2021-04-09 DIAGNOSIS — J984 Other disorders of lung: Secondary | ICD-10-CM | POA: Diagnosis not present

## 2021-04-09 LAB — BASIC METABOLIC PANEL
BUN/Creatinine Ratio: 19 (ref 10–24)
BUN: 22 mg/dL (ref 8–27)
CO2: 22 mmol/L (ref 20–29)
Calcium: 9.8 mg/dL (ref 8.6–10.2)
Chloride: 99 mmol/L (ref 96–106)
Creatinine, Ser: 1.16 mg/dL (ref 0.76–1.27)
Glucose: 102 mg/dL — ABNORMAL HIGH (ref 65–99)
Potassium: 5.4 mmol/L — ABNORMAL HIGH (ref 3.5–5.2)
Sodium: 138 mmol/L (ref 134–144)
eGFR: 67 mL/min/{1.73_m2} (ref >59)

## 2021-04-09 MED ORDER — TECHNETIUM TC 99M MEDRONATE IV KIT
20.0000 | PACK | Freq: Once | INTRAVENOUS | Status: AC | PRN
  Administered 2021-04-09: 22.8 via INTRAVENOUS

## 2021-04-09 MED ORDER — IOHEXOL 300 MG/ML  SOLN: 100.0000 mL | Freq: Once | INTRAMUSCULAR | Status: DC | PRN

## 2021-04-09 MED ORDER — IOHEXOL 300 MG/ML  SOLN
125.0000 mL | Freq: Once | INTRAMUSCULAR | Status: AC | PRN
  Administered 2021-04-09: 125 mL via INTRAVENOUS

## 2021-04-09 NOTE — Telephone Encounter (Signed)
Called pt about his potassium being high and Dr. Erlene Quan told him to hold the med and speak to PCP. Dr. Janese Banks orders the potassium so pt wanted to see what we thought. He was told that he holds the potassium until he comes back for his next visit in may. Also the insurance did not approve duragesic patch so we have to try long acting pain pill before we can use patch. We will be sending mscontin to pharmacy that pt will take q12 hours for long acting pain and the pt. Can continue to use norco for short acting pain. He is agreeable to this

## 2021-04-09 NOTE — Telephone Encounter (Addendum)
Left patient VM with details asked to return call   ----- Message from Hollice Espy, MD sent at 04/09/2021 11:56 AM EDT ----- Creatinine has trended back down to his previous baseline which is excellent news.  His potassium however is elevated, slightly above normal which can be dangerous and cause an arrhythmia.  He needs to stop potassium supplements immediately and contact his primary care physician.  Hollice Espy, MD

## 2021-04-10 ENCOUNTER — Telehealth: Payer: Self-pay | Admitting: Oncology

## 2021-04-10 MED ORDER — MORPHINE SULFATE ER 15 MG PO TBCR: 15.0000 mg | EXTENDED_RELEASE_TABLET | Freq: Two times a day (BID) | ORAL | 0 refills | Status: AC

## 2021-04-11 ENCOUNTER — Other Ambulatory Visit: Payer: Self-pay | Admitting: Oncology

## 2021-04-11 ENCOUNTER — Telehealth: Payer: Self-pay | Admitting: Oncology

## 2021-04-11 MED ORDER — ONDANSETRON 8 MG PO TBDP: 8.0000 mg | ORAL_TABLET | Freq: Three times a day (TID) | ORAL | 0 refills | Status: AC | PRN

## 2021-04-11 MED ORDER — PROCHLORPERAZINE MALEATE 10 MG PO TABS: 10.0000 mg | ORAL_TABLET | Freq: Four times a day (QID) | ORAL | 0 refills | Status: AC | PRN

## 2021-04-11 NOTE — Telephone Encounter (Signed)
Wife called to report that pt has nausea/vomiting. Can not keep pain medication down.  Rx zofran odt and compazine sent to pharmacy and instruction was discussed with wife. If no improved despite trying these 2 medication, pt needs to go to ER to be evaluated. Wife voices understanding.

## 2021-04-12 ENCOUNTER — Emergency Department
Admission: EM | Admit: 2021-04-12 | Discharge: 2021-04-12 | Disposition: A | Discharge status: Home / Self Care | Payer: PPO | Attending: Emergency Medicine | Admitting: Emergency Medicine

## 2021-04-12 ENCOUNTER — Other Ambulatory Visit: Payer: Self-pay

## 2021-04-12 ENCOUNTER — Emergency Department: Payer: PPO

## 2021-04-12 DIAGNOSIS — C7951 Secondary malignant neoplasm of bone: Secondary | ICD-10-CM | POA: Diagnosis not present

## 2021-04-12 DIAGNOSIS — Z96643 Presence of artificial hip joint, bilateral: Secondary | ICD-10-CM | POA: Diagnosis not present

## 2021-04-12 DIAGNOSIS — R000 Tachycardia, unspecified: Secondary | ICD-10-CM | POA: Diagnosis not present

## 2021-04-12 DIAGNOSIS — Z8546 Personal history of malignant neoplasm of prostate: Secondary | ICD-10-CM | POA: Insufficient documentation

## 2021-04-12 DIAGNOSIS — C787 Secondary malignant neoplasm of liver and intrahepatic bile duct: Secondary | ICD-10-CM | POA: Insufficient documentation

## 2021-04-12 DIAGNOSIS — C786 Secondary malignant neoplasm of retroperitoneum and peritoneum: Secondary | ICD-10-CM | POA: Diagnosis not present

## 2021-04-12 DIAGNOSIS — R1111 Vomiting without nausea: Secondary | ICD-10-CM | POA: Diagnosis not present

## 2021-04-12 DIAGNOSIS — Z950 Presence of cardiac pacemaker: Secondary | ICD-10-CM | POA: Insufficient documentation

## 2021-04-12 DIAGNOSIS — I10 Essential (primary) hypertension: Secondary | ICD-10-CM | POA: Diagnosis not present

## 2021-04-12 DIAGNOSIS — R110 Nausea: Secondary | ICD-10-CM

## 2021-04-12 DIAGNOSIS — R319 Hematuria, unspecified: Secondary | ICD-10-CM | POA: Diagnosis not present

## 2021-04-12 DIAGNOSIS — C799 Secondary malignant neoplasm of unspecified site: Secondary | ICD-10-CM

## 2021-04-12 DIAGNOSIS — Z91040 Latex allergy status: Secondary | ICD-10-CM | POA: Insufficient documentation

## 2021-04-12 DIAGNOSIS — M545 Low back pain, unspecified: Secondary | ICD-10-CM | POA: Insufficient documentation

## 2021-04-12 DIAGNOSIS — C61 Malignant neoplasm of prostate: Secondary | ICD-10-CM | POA: Diagnosis not present

## 2021-04-12 DIAGNOSIS — Z96653 Presence of artificial knee joint, bilateral: Secondary | ICD-10-CM | POA: Diagnosis not present

## 2021-04-12 DIAGNOSIS — Z8673 Personal history of transient ischemic attack (TIA), and cerebral infarction without residual deficits: Secondary | ICD-10-CM | POA: Diagnosis not present

## 2021-04-12 DIAGNOSIS — R0902 Hypoxemia: Secondary | ICD-10-CM | POA: Diagnosis not present

## 2021-04-12 DIAGNOSIS — Z79899 Other long term (current) drug therapy: Secondary | ICD-10-CM | POA: Diagnosis not present

## 2021-04-12 DIAGNOSIS — R001 Bradycardia, unspecified: Secondary | ICD-10-CM | POA: Diagnosis not present

## 2021-04-12 DIAGNOSIS — R112 Nausea with vomiting, unspecified: Secondary | ICD-10-CM | POA: Diagnosis not present

## 2021-04-12 DIAGNOSIS — M5459 Other low back pain: Secondary | ICD-10-CM | POA: Diagnosis not present

## 2021-04-12 DIAGNOSIS — S22080A Wedge compression fracture of T11-T12 vertebra, initial encounter for closed fracture: Secondary | ICD-10-CM | POA: Diagnosis not present

## 2021-04-12 DIAGNOSIS — M549 Dorsalgia, unspecified: Secondary | ICD-10-CM | POA: Diagnosis not present

## 2021-04-12 LAB — URINALYSIS, COMPLETE (UACMP) WITH MICROSCOPIC
Bacteria, UA: NONE SEEN
Bilirubin Urine: NEGATIVE
Glucose, UA: NEGATIVE mg/dL
Ketones, ur: 5 mg/dL — AB
Leukocytes,Ua: NEGATIVE
Nitrite: NEGATIVE
Protein, ur: 100 mg/dL — AB
RBC / HPF: >50 RBC/hpf — ABNORMAL HIGH (ref 0–5)
Specific Gravity, Urine: 1.018 (ref 1.005–1.030)
WBC, UA: >50 WBC/hpf — ABNORMAL HIGH (ref 0–5)
pH: 6 (ref 5.0–8.0)

## 2021-04-12 LAB — CBC WITH DIFFERENTIAL/PLATELET
Abs Immature Granulocytes: 0.03 10*3/uL (ref 0.00–0.07)
Basophils Absolute: 0 10*3/uL (ref 0.0–0.1)
Basophils Relative: 0 %
Eosinophils Absolute: 0.1 10*3/uL (ref 0.0–0.5)
Eosinophils Relative: 2 %
HCT: 31.6 % — ABNORMAL LOW (ref 39.0–52.0)
Hemoglobin: 10.3 g/dL — ABNORMAL LOW (ref 13.0–17.0)
Immature Granulocytes: 1 %
Lymphocytes Relative: 16 %
Lymphs Abs: 0.9 10*3/uL (ref 0.7–4.0)
MCH: 28.7 pg (ref 26.0–34.0)
MCHC: 32.6 g/dL (ref 30.0–36.0)
MCV: 88 fL (ref 80.0–100.0)
Monocytes Absolute: 0.5 10*3/uL (ref 0.1–1.0)
Monocytes Relative: 9 %
Neutro Abs: 4.3 10*3/uL (ref 1.7–7.7)
Neutrophils Relative %: 72 %
Platelets: 139 10*3/uL — ABNORMAL LOW (ref 150–400)
RBC: 3.59 MIL/uL — ABNORMAL LOW (ref 4.22–5.81)
RDW: 16.6 % — ABNORMAL HIGH (ref 11.5–15.5)
WBC: 5.9 10*3/uL (ref 4.0–10.5)
nRBC: 0 % (ref 0.0–0.2)

## 2021-04-12 LAB — COMPREHENSIVE METABOLIC PANEL
ALT: 11 U/L (ref 0–44)
AST: 39 U/L (ref 15–41)
Albumin: 3.5 g/dL (ref 3.5–5.0)
Alkaline Phosphatase: 159 U/L — ABNORMAL HIGH (ref 38–126)
Anion gap: 11 (ref 5–15)
BUN: 16 mg/dL (ref 8–23)
CO2: 21 mmol/L — ABNORMAL LOW (ref 22–32)
Calcium: 8.5 mg/dL — ABNORMAL LOW (ref 8.9–10.3)
Chloride: 103 mmol/L (ref 98–111)
Creatinine, Ser: 0.97 mg/dL (ref 0.61–1.24)
GFR, Estimated: >60 mL/min (ref >60)
Glucose, Bld: 108 mg/dL — ABNORMAL HIGH (ref 70–99)
Potassium: 3.9 mmol/L (ref 3.5–5.1)
Sodium: 135 mmol/L (ref 135–145)
Total Bilirubin: 1.1 mg/dL (ref 0.3–1.2)
Total Protein: 6.4 g/dL — ABNORMAL LOW (ref 6.5–8.1)

## 2021-04-12 LAB — PROTIME-INR
INR: 1.8 — ABNORMAL HIGH (ref 0.8–1.2)
Prothrombin Time: 20.6 seconds — ABNORMAL HIGH (ref 11.4–15.2)

## 2021-04-12 MED ORDER — OXYCODONE-ACETAMINOPHEN 5-325 MG PO TABS
1.0000 | ORAL_TABLET | Freq: Once | ORAL | Status: AC
  Administered 2021-04-12: 1 via ORAL
  Filled 2021-04-12: qty 1

## 2021-04-12 MED ORDER — HYDROMORPHONE HCL 1 MG/ML IJ SOLN
1.0000 mg | Freq: Once | INTRAMUSCULAR | Status: AC
  Administered 2021-04-12: 1 mg via INTRAVENOUS
  Filled 2021-04-12: qty 1

## 2021-04-12 MED ORDER — LACTATED RINGERS IV BOLUS: 500.0000 mL | Freq: Once | INTRAVENOUS | Status: DC

## 2021-04-12 MED ORDER — ONDANSETRON HCL 4 MG/2ML IJ SOLN
4.0000 mg | Freq: Once | INTRAMUSCULAR | Status: AC
  Administered 2021-04-12: 4 mg via INTRAVENOUS
  Filled 2021-04-12: qty 2

## 2021-04-12 MED ORDER — MORPHINE SULFATE (PF) 4 MG/ML IV SOLN
4.0000 mg | Freq: Once | INTRAVENOUS | Status: AC
  Administered 2021-04-12: 4 mg via INTRAVENOUS
  Filled 2021-04-12: qty 1

## 2021-04-12 NOTE — ED Provider Notes (Signed)
[facility] Emergency Department Provider Note  ____________________________________________   Event Date/Time   First MD Initiated Contact with Patient 04/12/21 [id number]     (approximate)  I have reviewed the triage vital signs and the nursing notes.   HISTORY  Chief Complaint Nausea and Hematuria   HPI Jeremy Carney is a 75 y.o. 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 fib on Eliquis, CVA, chemo induced peripheral neuropathy, radiation cystitis, gout, kidney stones, and arthritis who presents via EMS from home for assessment of approximately 1 week of worsening left lower back pain associate with some nausea and vomiting.  Patient denies any headache, earache, sore throat, chest pain, cough, shortness of breath, fevers, abdominal pain or rash or extremity pain.  No recent falls or injuries.  He has had gross blood in his urine over the last couple of days.  Has not been significantly painful to urinate.  Was recently prescribed fentanyl patch but has not been taking this as he was told he should not take his meloxicam because it is not good for his kidney function.      Past Medical History:  Diagnosis Date  . Arthritis, degenerative 10/30/2015  . Atrial fibrillation ([place]) 10/30/2015  . Bladder spasm 06/12/2013  . Carcinoma of prostate ([place]) 10/30/2015  . Cardiomyopathy ([place]) 09/10/2014   Overview:  EF 35%,4/15   . Cerebrovascular accident (CVA) ([place]) 10/30/2015   Overview:  Left embolic [id number]   . Difficult or painful urination 11/21/2013  . Dysrhythmia   . Gout 10/30/2015  . History of kidney stones   . Malignant neoplasm of prostate ([place]) 09/05/2012  . Presence of permanent cardiac pacemaker   . Sinoatrial node dysfunction (HCC) 10/30/2015   Overview:  DDD pacemaker, 1996     Patient Active Problem List   Diagnosis Date Noted  . Aortic atherosclerosis  ([place]) 09/30/2020  . 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 Metastasis to bone ([place]) 04/16/2020  . Thrombocytopenia ([place]) 09/25/2019  . Hx of adenomatous colonic polyps 02/16/2019  . B12 deficiency 11/07/2018  . Chronic gouty arthritis 05/04/2018  . Neuropathy due to chemotherapeutic drug ([place]) 10/28/2017  . Dehydration 10/12/2017  . Chemotherapy-induced peripheral neuropathy ([place]) 09/27/2017  . Goals of care, counseling/discussion 06/21/2017  . 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 SUI (stress urinary incontinence), male 04/12/2017  . Radiation cystitis 04/12/2017  . Atrial fibrillation ([place]) 10/30/2015  . Gout 10/30/2015  . 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 Arthritis, degenerative 10/30/2015  . Carcinoma of prostate ([place]) 10/30/2015  . Sinoatrial node dysfunction ([place]) 10/30/2015  . Cerebrovascular accident (CVA) ([place]) 10/30/2015  . 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 Cardiomyopathy ([place]) 09/10/2014  . Difficult or painful urination 11/21/2013  . Bladder spasm 06/12/2013  . FOM (frequency of micturition) 06/12/2013  . Malignant neoplasm of prostate ([place]) 09/05/2012  . H/O urinary stone 09/05/2012    Past Surgical History:  Procedure Laterality Date  . castration  10/2009  . CATARACT EXTRACTION W/ INTRAOCULAR LENS  IMPLANT, BILATERAL Bilateral 2016   right eye done then the left one done 3 months later in 2016  . COLONOSCOPY WITH PROPOFOL N/A 11/21/2019   Procedure: COLONOSCOPY WITH PROPOFOL;  Surgeon: Robert Bellow, MD;  Location: [facility] ENDOSCOPY;  Service: Endoscopy;  Laterality: N/A;  . CYSTOSCOPY W/ RETROGRADES Bilateral 03/26/2021   Procedure: CYSTOSCOPY WITH RETROGRADE  PYELOGRAM;  Surgeon: Hollice Espy, MD;  Location: [facility] ORS;  Service: Urology;  Laterality: Bilateral;  . CYSTOSCOPY W/ URETERAL STENT PLACEMENT Right 03/26/2021   Procedure: CYSTOSCOPY WITH STENT PLACEMENT;  Surgeon: Hollice Espy, MD;  Location: [facility] ORS;  Service: Urology;  Laterality: Right;  . INGUINAL HERNIA REPAIR Left   . INSERT / REPLACE / REMOVE PACEMAKER    . IR FLUORO GUIDE PORT INSERTION RIGHT  07/01/2017  . IR REMOVAL TUN ACCESS W/ PORT W/O FL MOD SED  06/20/2020  . IR REMOVAL TUN CV CATH W/O FL  08/01/2020  . JOINT REPLACEMENT Bilateral 1980   hips  . left knee replacement  2014  . PACEMAKER INSERTION    . PORTA CATH INSERTION N/A 09/11/2020   Procedure: PORTA CATH INSERTION;  Surgeon: Algernon Huxley, MD;  Location: [place];  Service: Cardiovascular;  Laterality: N/A;  . PROSTATECTOMY    . REPLACEMENT TOTAL HIP W/  RESURFACING IMPLANTS  1997   bilateral-   . REPLACEMENT TOTAL KNEE Bilateral   . testes removal     per pt   . TONSILLECTOMY AND ADENOIDECTOMY    . TRANSURETHRAL RESECTION OF BLADDER TUMOR N/A 12/31/2015   Procedure: BLADDER BIOPSY;  Surgeon: Nickie Retort, MD;  Location: [facility] ORS;  Service: Urology;  Laterality: N/A;  . Urinary incontinence valve     AMS    Prior to Admission medications   Medication Sig Start Date End Date Taking? Authorizing Provider  acetaminophen (TYLENOL) 325 MG tablet Take 650 mg by mouth 2 (two) times daily.    [provider]  allopurinol (ZYLOPRIM) 300 MG tablet Take 300 mg by mouth daily. 09/10/14   [provider]  bisacodyl (DULCOLAX) 5 MG EC tablet Take 5 mg by mouth 2 (two) times daily as needed for moderate constipation.    [provider]  calcium carbonate (OS-CAL) 600 MG TABS tablet Take 600 mg by mouth 2 (two) times daily with a meal.    [provider]  cyanocobalamin (,VITAMIN B-12,) 1000 MCG/ML injection Inject 1,000 mcg into the muscle every 30 (thirty) days.  05/01/19   [provider]  diltiazem (CARDIZEM CD) 180 MG 24 hr capsule Take 180 mg by mouth daily. 03/27/20   [provider]  ELIQUIS 5 MG TABS tablet Take 5 mg by mouth 2 (two) times daily. 02/25/21   [provider]  fentaNYL (DURAGESIC) 12 MCG/HR Place 1 patch onto the skin every 3 (three) days. 04/08/21   Sindy Guadeloupe, MD  furosemide (LASIX) 20 MG tablet Take 20 mg by mouth 2 (two) times daily. 03/25/20   [provider]  gabapentin (NEURONTIN) 300 MG capsule Take 1 capsule (300 mg total) by mouth 3 (three) times daily. 01/09/21   Sindy Guadeloupe, MD  HYDROcodone-acetaminophen (NORCO) 10-325 MG tablet Take 1 tablet by mouth every 4 (four) hours as needed. 03/25/21   Sindy Guadeloupe, MD  lidocaine-prilocaine (EMLA) cream Apply to affected area once Patient not taking: No sig reported 09/10/20   Sindy Guadeloupe, MD  morphine (MS CONTIN) 15 MG 12 hr tablet Take 1 tablet (15 mg total) by mouth every 12 (twelve) hours. 04/10/21   Sindy Guadeloupe, MD  ondansetron (ZOFRAN ODT) 8 MG disintegrating tablet Take 1 tablet (8 mg total) by mouth every 8 (eight) hours as needed for nausea or vomiting. 04/11/21   Earlie Server, MD  potassium chloride (KLOR-CON) 10 MEQ tablet TAKE 2 TABLETS BY  MOUTH ONCE DAILY Patient taking differently: Take 20 mEq by mouth daily. 03/05/21   Sindy Guadeloupe, MD  prochlorperazine (COMPAZINE) 10 MG tablet Take 1 tablet (10 mg total) by mouth every 6 (six) hours as needed for nausea or vomiting. 04/11/21   Earlie Server, MD  simvastatin (ZOCOR) 40 MG tablet Take 40 mg by mouth daily at 6 PM.  09/10/14   [provider]    Allergies Latex  Family History  Problem Relation Age of Onset  . Arthritis Mother   . Throat cancer Mother   . Heart attack Father   . Arthritis Father   . Transient ischemic attack Brother   . Prostate cancer Neg Hx   . Bladder Cancer Neg Hx     Social History Social History   Tobacco Use  . Smoking status: Never Smoker  . Smokeless tobacco: Never Used  Vaping Use  . Vaping Use: Never used  Substance Use Topics  . Alcohol use: No    Alcohol/week: 0.0 standard drinks  . Drug use: No    Review of Systems  Review of Systems   Constitutional: Negative for chills and fever.  HENT: Negative for sore throat.   Eyes: Negative for pain.  Respiratory: Negative for cough and stridor.   Cardiovascular: Negative for chest pain.  Gastrointestinal: Positive for nausea and vomiting.  Genitourinary: Negative for dysuria.  Musculoskeletal: Positive for back pain. Negative for myalgias.  Skin: Negative for rash.  Neurological: Negative for seizures, loss of consciousness and headaches.  Psychiatric/Behavioral: Negative for suicidal ideas.  All other systems reviewed and are negative.     ____________________________________________   PHYSICAL EXAM:  VITAL SIGNS: ED Triage Vitals  Enc Vitals Group     BP      Pulse      Resp      Temp      Temp src      SpO2      Weight      Height      Head Circumference      Peak Flow      Pain Score      Pain Loc      Pain Edu?      Excl. in [place]?    Vitals:   04/12/21 0930 04/12/21 1000  BP: 117/73 135/73  Pulse: 89 (!) 56  Resp: 15 16  Temp:    SpO2: 94% 100%   Physical Exam Vitals and nursing note reviewed.  Constitutional:      Appearance: He is well-developed.  HENT:     Head: Normocephalic and atraumatic.     Right Ear: External ear normal.     Left Ear: External ear normal.     Nose: Nose normal.  Eyes:     Conjunctiva/sclera: Conjunctivae normal.  Cardiovascular:     Rate and Rhythm: Normal rate and regular rhythm.     Pulses: Normal pulses.     Heart sounds: No murmur heard.   Pulmonary:     Effort: Pulmonary effort is normal. No respiratory distress.     Breath sounds: Normal breath sounds.  Abdominal:     Palpations: Abdomen is soft.     Tenderness: There is no abdominal tenderness. There is left CVA tenderness. There is no right CVA tenderness.  Musculoskeletal:        General: No swelling.     Cervical back: Neck supple.  Skin:    General: Skin is warm and dry.  Capillary Refill: Capillary refill takes less than 2 seconds.   Neurological:     Mental Status: He is alert and oriented to person, place, and time.  Psychiatric:        Mood and Affect: Mood normal.     No bleeding injuries rashes or lesions over the scrotum or penis ____________________________________________   LABS (all labs ordered are listed, but only abnormal results are displayed)  Labs Reviewed  CBC WITH DIFFERENTIAL/PLATELET - Abnormal; Notable for the following components:      Result Value   RBC 3.59 (*)    Hemoglobin 10.3 (*)    HCT 31.6 (*)    RDW 16.6 (*)    Platelets 139 (*)    All other components within normal limits  COMPREHENSIVE METABOLIC PANEL - Abnormal; Notable for the following components:   CO2 21 (*)    Glucose, Bld 108 (*)    Calcium 8.5 (*)    Total Protein 6.4 (*)    Alkaline Phosphatase 159 (*)    All other components within normal limits  URINALYSIS, COMPLETE (UACMP) WITH MICROSCOPIC - Abnormal; Notable for the following components:   Color, Urine AMBER (*)    APPearance CLOUDY (*)    Hgb urine dipstick LARGE (*)    Ketones, ur 5 (*)    Protein, ur 100 (*)    RBC / HPF >50 (*)    WBC, UA >50 (*)    All other components within normal limits  PROTIME-INR - Abnormal; Notable for the following components:   Prothrombin Time 20.6 (*)    INR 1.8 (*)    All other components within normal limits   ____________________________________________  EKG  ____________________________________________  RADIOLOGY  ED MD interpretation:   CT abdomen pelvis shows some bulky retroperitoneal and liver metastatic disease with some free fluid in the abdomen and previously seen bilateral pleural effusions.  Patient's right double-J stent appears stable and no new findings in the left.  There is diffuse metastatic osseous disease.  No evidence of SBO or diverticulitis.  Official radiology report(s): CT Renal Stone Study  Result Date: 04/12/2021 CLINICAL DATA:  75 year old male with nausea vomiting and hematuria. Rule  urethral stent placement 2 weeks ago. Metastatic prostate cancer. EXAM: CT ABDOMEN AND PELVIS WITHOUT CONTRAST TECHNIQUE: Multidetector CT imaging of the abdomen and pelvis was performed following the standard protocol without IV contrast. COMPARISON:  Recent restaging CT Chest, Abdomen, and Pelvis 04/09/2021, and earlier. FINDINGS: Lower chest: Small but increasing left pleural effusion since 04/09/2021. Associated Patchy and confluent left lung base opacity is chronic and appears stable. Cardiomegaly. No pericardial effusion. Partially visible pacemaker leads. Trace new right pleural effusion. Stable mild right lung base scarring. Hepatobiliary: Trace but increased perihepatic free fluid with widespread hypodense hepatic metastases. Stable gallbladder and bile ducts. Pancreas: Atrophied. Spleen: Negative. Adrenals/Urinary Tract: Metastatic ex nodal disease in the region of the left adrenal gland which is difficult to delineate. Right adrenal remains normal. Mild mass effect on, lateral displacement of the left kidney from the bulky retroperitoneal tumor. Stable left renal collecting system without overt hydronephrosis. Left ureter appears stable into the pelvis, nondilated. Right double-J ureteral stent remains in place. Stable right renal collecting system. Stent terminates appropriately in the bladder as before. No adverse features identified. Stomach/Bowel: No dilated large or small bowel. Small volume of retained oral contrast from the recent restaging study. No free air. However there is a new small volume of free fluid tracking into the right pelvic inlet  from the lower abdomen on series 2, image 76. But no adjacent bowel abnormality. Small fat and fluid containing umbilical hernia is stable. Vascular/Lymphatic: Aortoiliac calcified atherosclerosis. Vascular patency is not evaluated in the absence of IV contrast. Bulky retroperitoneal metastatic ex ill lymph nodal disease inseparable from the abdominal aorta  and infrarenal IVC is stable from the recent restaging CT, encompassing up to 10 cm diameter in the left abdomen on series 2, image 42. Lesser pelvic lymphadenopathy is stable. Reproductive: Stable prostatectomy. Other: Stable presacral stranding.  No pelvic free fluid. Musculoskeletal: Osteopenia with chronic right posterior rib fractures. Heterogeneous bone mineralization in the spine with T12 moderate to severe compression fracture and sclerotic anterior L3 vertebral body lesion stable from the recent restaging study. Prior bilateral hip arthroplasty. No new osseous lesion identified. IMPRESSION: 1. Bulky retroperitoneal and liver metastatic disease as on the recent 04/09/2021 restaging CT. 2. Small but increasing volume of free fluid in the abdomen and pelvis. Small but increasing pleural effusions. 3. Kidneys and ureters appear stable. Stable right double-J ureteral stent with no adverse features identified. 4. Stable osseous metastatic disease. 5. No bowel obstruction. Electronically Signed   By: Odessa Fleming M.D.   On: 04/12/2021 08:57    ____________________________________________   PROCEDURES  Procedure(s) performed (including Critical Care):  .1-3 Lead EKG Interpretation Performed by: Gilles Chiquito, MD Authorized by: Gilles Chiquito, MD     Interpretation: normal     ECG rate assessment: bradycardic     Rhythm: sinus rhythm     Ectopy: none       ____________________________________________   INITIAL IMPRESSION / ASSESSMENT AND PLAN / ED COURSE      Patient presents with above to history exam EMS from home for assessment of some nausea vomiting left lower back pain and gross hematuria over the last couple days.  This is in the setting of known metastatic prostate cancer currently on chemo and recent placement of a right ureteral stent for some hydronephrosis.  On arrival he is afebrile and hemodynamically stable.  He appears uncomfortable and some left CVA pain.  No recent  injuries or abdominal tenderness or significant findings on GU exam.  Differential includes kidney stone, no hydronephrosis, pyonephritis, pain from patient's known metastatic disease, SBO, diverticulitis, and cystitis.  CBC shows no leukocytosis and hemoglobin at baseline.  CMP shows no significant electrolyte or metabolic derangements.  Patient has improving kidney function with a creatinine of 0.97 compared to 1.43 weeks ago.  UA has greater than 50 RBCs and WBCs but given absence of burning with urination or any nitrites or leukocyte esterase low suspicion for infection at this time specially given absence of fever or cytosis.  INR is unremarkable.  CT abdomen pelvis shows some bulky retroperitoneal and liver metastatic disease with some free fluid in the abdomen and previously seen bilateral pleural effusions.  Patient's right double-J stent appears stable and no new findings in the left.  There is diffuse metastatic osseous disease.  No evidence of SBO or diverticulitis.  Given otherwise reassuring work-up I suspect patient's pain is likely secondary to his widespread metastatic cancer.  Seem to his best to have started on a fentanyl patch for this but has been able to get this due to some insurance issues.  States his nausea has not improved after receiving Zofran in the ED but his pain still fairly significant after receiving some morphine and Percocet.  Was given some Dilaudid on reassessment stated felt better.  Discussed possible  admission to hospital for better pain control versus going home with plan to talk to his oncologist tomorrow morning to see about alternative pain management if he is unable to get his fentanyl patch.  He strongly refers to go home given improvement in pain increases renal.  Discharged stable condition.  Return precautions advised and discussed.  NSAIDs deferred given hematuria and patient be anticoagulated.   ____________________________________________   FINAL  CLINICAL IMPRESSION(S) / ED DIAGNOSES  Final diagnoses:  Nausea  Acute left-sided low back pain, unspecified whether sciatica present  Hematuria, unspecified type  Metastatic malignant neoplasm, unspecified site (HCC)    Medications  ondansetron (ZOFRAN) injection 4 mg (4 mg Intravenous Given 04/12/21 0840)  morphine 4 MG/ML injection 4 mg (4 mg Intravenous Given 04/12/21 0840)  oxyCODONE-acetaminophen (PERCOCET/ROXICET) 5-325 MG per tablet 1 tablet (1 tablet Oral Given 04/12/21 0954)  HYDROmorphone (DILAUDID) injection 1 mg (1 mg Intravenous Given 04/12/21 1030)     ED Discharge Orders    None       Note:  This document was prepared using Dragon voice recognition software and may include unintentional dictation errors.   Lucrezia Starch, MD 04/12/21 1057

## 2021-04-12 NOTE — ED Triage Notes (Signed)
Pt brought in via EMS from home for n/v and hematuria.  Pt c/o n/v for 1 week and also c/o intermittent hematuria since urethral stent placement 2 weeks ago.  Pt reports c/o flank pain. HX of prostate CA.

## 2021-04-12 NOTE — ED Notes (Signed)
Port de-accessed without complication.  D/C instructions given.  Advised of follow up.  All questions addressed.  Understanding verbalized.

## 2021-04-13 ENCOUNTER — Encounter: Payer: Self-pay | Admitting: Oncology

## 2021-04-13 ENCOUNTER — Inpatient Hospital Stay: Payer: PPO | Attending: Oncology | Admitting: Oncology

## 2021-04-13 ENCOUNTER — Inpatient Hospital Stay: Payer: PPO

## 2021-04-13 ENCOUNTER — Other Ambulatory Visit: Payer: Self-pay | Admitting: Oncology

## 2021-04-13 ENCOUNTER — Ambulatory Visit: Payer: PPO

## 2021-04-13 ENCOUNTER — Other Ambulatory Visit: Payer: Self-pay | Admitting: *Deleted

## 2021-04-13 VITALS — HR 76 | Temp 97.0°F | Resp 20 | Wt 259.0 lb

## 2021-04-13 VITALS — BP 121/64 | HR 66

## 2021-04-13 DIAGNOSIS — E860 Dehydration: Secondary | ICD-10-CM

## 2021-04-13 DIAGNOSIS — Z95828 Presence of other vascular implants and grafts: Secondary | ICD-10-CM

## 2021-04-13 DIAGNOSIS — C61 Malignant neoplasm of prostate: Secondary | ICD-10-CM | POA: Diagnosis not present

## 2021-04-13 DIAGNOSIS — T451X5D Adverse effect of antineoplastic and immunosuppressive drugs, subsequent encounter: Secondary | ICD-10-CM | POA: Diagnosis not present

## 2021-04-13 DIAGNOSIS — G893 Neoplasm related pain (acute) (chronic): Secondary | ICD-10-CM | POA: Diagnosis not present

## 2021-04-13 DIAGNOSIS — G620 Drug-induced polyneuropathy: Secondary | ICD-10-CM | POA: Insufficient documentation

## 2021-04-13 DIAGNOSIS — C7951 Secondary malignant neoplasm of bone: Secondary | ICD-10-CM

## 2021-04-13 DIAGNOSIS — Z7189 Other specified counseling: Secondary | ICD-10-CM

## 2021-04-13 DIAGNOSIS — Z9079 Acquired absence of other genital organ(s): Secondary | ICD-10-CM | POA: Insufficient documentation

## 2021-04-13 MED ORDER — OLANZAPINE 10 MG PO TABS: 10.0000 mg | ORAL_TABLET | Freq: Every day | ORAL | 1 refills | Status: AC

## 2021-04-13 MED ORDER — HEPARIN SOD (PORK) LOCK FLUSH 100 UNIT/ML IV SOLN
500.0000 [IU] | Freq: Once | INTRAVENOUS | Status: AC
Start: 2021-04-13 — End: 2021-04-13
  Administered 2021-04-13: 500 [IU] via INTRAVENOUS
  Filled 2021-04-13: qty 5

## 2021-04-13 MED ORDER — HYDROMORPHONE HCL 2 MG PO TABS: ORAL_TABLET | ORAL | 0 refills | Status: AC

## 2021-04-13 MED ORDER — SODIUM CHLORIDE 0.9 % IV SOLN
Freq: Once | INTRAVENOUS | Status: AC
Start: 2021-04-13 — End: 2021-04-13
  Filled 2021-04-13: qty 250

## 2021-04-13 MED ORDER — SODIUM CHLORIDE 0.9% FLUSH
10.0000 mL | Freq: Once | INTRAVENOUS | Status: AC
  Administered 2021-04-13: 10 mL via INTRAVENOUS
  Filled 2021-04-13: qty 10

## 2021-04-13 MED ORDER — SODIUM CHLORIDE 0.9 % IV SOLN
10.0000 mg | Freq: Once | INTRAVENOUS | Status: AC
  Administered 2021-04-13: 10 mg via INTRAVENOUS
  Filled 2021-04-13: qty 10

## 2021-04-13 NOTE — Progress Notes (Signed)
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 . He again began to have a rising PSA in 2010 and underwent orchiectomy in 2010.He had biochemical recurrence in 2017 and was treated with IMRT  He underwent CT abdomen and pelvis in May 2018which showed abnormal periaortic and right common iliac lymph nodes. Index aortocaval lymph node 1.4 cm which was new as compared to February 2014 and suspicious for malignancy. Bone scan did not reveal any evidence of bony metastatic disease.PSA went up from 3.9 in January 20 18-7.8 in April 2018. 13.3 in July 2018 when docetaxel was initiated. Doubling time at that time was about 3 months  Docetaxel chemo initiated for CRPC with ln mets and rapid doubling PSA in august 2018.Patient completed 5 cycles of docetaxel on 09/27/2017. Cycle #6 was not given due to progressive fatigue and cytopenias as well as worsening neuropathy. Cycle 5 of docetaxel was dose reduced to 60 mg/m square.Patient never had normalization of his PSA following docetaxel and the lowest PSA after docetaxel was 5.64 in December 2018  In April 2019 patient noted to have rising PSAto 19.02and increase in the size of retroperitoneal LN. Patient therefore started second line zytiga.  Patient has remained on Zytiga since April 2019. The lowest PSA value on Zytiga was 6.1 in August 2020 and since then there has been a small but steady  increase in his PSA.Most recent imaging in November 2020 showed increase in the size of retroperitoneal lymph nodes from 0.7 cm to 1.1 cm. New periaortic lymph node 1.2 cm. Slight progression of sclerotic bone metastases involving the left scapula as well as T4 and T7. Most recent PSA on 03/07/2020 was 23.9.Given the subtle progression Zytiga was not changed at that time.  repat LN biopsy showed adenocarcinoma prostate. NGSPD-L1 1%. PTEN C388C (R130X)  Patient went to [facility] opinion and was recommended Provenge given the rise in PSA and mild radiological progression.Patient had disease progression on Provenge and switch to third line cabazitaxel.Significant disease progression noted on scans in April 2022  Interval history-patient reports worsening pain in his flanks for which she is taking hydrocodone but does not think it has helped him as much.  States that when he was in the ER a couple of days ago he received Dilaudid which seems to help his pain a lot.  He also has symptoms of ongoing nausea and poor appetite.  ECOG PS- 2-3 Pain scale- 5 Opioid associated constipation- no  Review of systems- Review of Systems  Constitutional: Positive for malaise/fatigue. Negative for chills, fever and weight loss.  HENT: Negative for congestion, ear discharge and nosebleeds.   Eyes: Negative for blurred vision.  Respiratory: Negative for cough, hemoptysis, sputum production, shortness of breath and wheezing.   Cardiovascular: Negative for chest pain, palpitations, orthopnea and claudication.  Gastrointestinal: Negative for abdominal pain, blood in stool, constipation, diarrhea, heartburn, melena, nausea and vomiting.  Genitourinary: Negative for dysuria, flank pain, frequency, hematuria and urgency.  Musculoskeletal: Negative for back pain,  joint pain and myalgias.  Skin: Negative for rash.  Neurological: Negative for dizziness, tingling, focal weakness, seizures, weakness and  headaches.  Endo/Heme/Allergies: Does not bruise/bleed easily.  Psychiatric/Behavioral: Negative for depression and suicidal ideas. The patient does not have insomnia.       Allergies  Allergen Reactions  . Latex Rash    Sensitivity, not allergy.     Past Medical History:  Diagnosis Date  . Arthritis, degenerative 10/30/2015  . Atrial fibrillation ([place]) 10/30/2015  . Bladder spasm 06/12/2013  . Carcinoma of prostate ([place]) 10/30/2015  . Cardiomyopathy ([place]) 09/10/2014   Overview:  EF 35%,4/15   . Cerebrovascular accident (CVA) ([place]) 10/30/2015   Overview:  Left embolic [id number]   . Difficult or painful urination 11/21/2013  . Dysrhythmia   . Gout 10/30/2015  . History of kidney stones   . Malignant neoplasm of prostate ([place]) 09/05/2012  . Presence of permanent cardiac pacemaker   . Sinoatrial node dysfunction ([facility]) 10/30/2015   Overview:  DDD pacemaker, 1996      Past Surgical History:  Procedure Laterality Date  . castration  10/2009  . CATARACT EXTRACTION W/ INTRAOCULAR LENS  IMPLANT, BILATERAL Bilateral 2016   right eye done then the left one done 3 months later in 2016  . COLONOSCOPY WITH PROPOFOL N/A 11/21/2019   Procedure: COLONOSCOPY WITH PROPOFOL;  Surgeon: Robert Bellow, MD;  Location: [facility] ENDOSCOPY;  Service: Endoscopy;  Laterality: N/A;  . CYSTOSCOPY W/ RETROGRADES Bilateral 03/26/2021   Procedure: CYSTOSCOPY WITH RETROGRADE PYELOGRAM;  Surgeon: Hollice Espy, MD;  Location: [facility] ORS;  Service: Urology;  Laterality: Bilateral;  . CYSTOSCOPY W/ URETERAL STENT PLACEMENT Right 03/26/2021   Procedure: CYSTOSCOPY WITH STENT PLACEMENT;  Surgeon: Hollice Espy, MD;  Location: [facility] ORS;  Service: Urology;  Laterality: Right;  . INGUINAL HERNIA REPAIR Left   . INSERT / REPLACE / REMOVE PACEMAKER    . IR FLUORO GUIDE PORT INSERTION RIGHT  07/01/2017  . IR REMOVAL TUN ACCESS W/ PORT W/O FL MOD SED  06/20/2020  . IR REMOVAL TUN CV CATH W/O FL  08/01/2020  . JOINT  REPLACEMENT Bilateral 1980   hips  . left knee replacement  2014  . PACEMAKER INSERTION    . PORTA CATH INSERTION N/A 09/11/2020   Procedure: PORTA CATH INSERTION;  Surgeon: Algernon Huxley, MD;  Location: [facility];  Service: Cardiovascular;  Laterality: N/A;  . PROSTATECTOMY    . REPLACEMENT TOTAL HIP W/  RESURFACING IMPLANTS  1997   bilateral-   . REPLACEMENT TOTAL KNEE Bilateral   . testes removal     per pt   . TONSILLECTOMY AND ADENOIDECTOMY    . TRANSURETHRAL RESECTION OF BLADDER TUMOR N/A 12/31/2015   Procedure: BLADDER BIOPSY;  Surgeon: Nickie Retort, MD;  Location: [facility] ORS;  Service: Urology;  Laterality: N/A;  . Urinary incontinence valve     AMS    Social History   Socioeconomic History  . Marital status: Married    Spouse name: Not on file  . Number of children: Not on file  . Years of education: Not on file  . Highest education level: Not on file  Occupational History  . Not on file  Tobacco Use  . Smoking status: Never Smoker  . Smokeless tobacco: Never Used  Vaping Use  . Vaping Use: Never used  Substance and Sexual Activity  . Alcohol use: No    Alcohol/week: 0.0 standard drinks  . Drug use: No  .  Sexual activity: Never  Other Topics Concern  . Not on file  Social History Narrative   Lives at home with spouse   Social Determinants of Health   Financial Resource Strain: Not on file  Food Insecurity: Not on file  Transportation Needs: Not on file  Physical Activity: Not on file  Stress: Not on file  Social Connections: Not on file  Intimate Partner Violence: Not on file    Family History  Problem Relation Age of Onset  . Arthritis Mother   . Throat cancer Mother   . Heart attack Father   . Arthritis Father   . Transient ischemic attack Brother   . Prostate cancer Neg Hx   . Bladder Cancer Neg Hx      Current Outpatient Medications:  .  acetaminophen (TYLENOL) 325 MG tablet, Take 650 mg by mouth 2 (two) times daily., Disp:  , Rfl:  .  allopurinol (ZYLOPRIM) 300 MG tablet, Take 300 mg by mouth daily., Disp: , Rfl:  .  bisacodyl (DULCOLAX) 5 MG EC tablet, Take 5 mg by mouth 2 (two) times daily as needed for moderate constipation., Disp: , Rfl:  .  calcium carbonate (OS-CAL) 600 MG TABS tablet, Take 600 mg by mouth 2 (two) times daily with a meal., Disp: , Rfl:  .  cyanocobalamin (,VITAMIN B-12,) 1000 MCG/ML injection, Inject 1,000 mcg into the muscle every 30 (thirty) days. , Disp: , Rfl:  .  diltiazem (CARDIZEM CD) 180 MG 24 hr capsule, Take 180 mg by mouth daily., Disp: , Rfl:  .  ELIQUIS 5 MG TABS tablet, Take 5 mg by mouth 2 (two) times daily., Disp: , Rfl:  .  furosemide (LASIX) 20 MG tablet, Take 20 mg by mouth 2 (two) times daily., Disp: , Rfl:  .  gabapentin (NEURONTIN) 300 MG capsule, Take 1 capsule (300 mg total) by mouth 3 (three) times daily., Disp: 90 capsule, Rfl: 3 .  HYDROcodone-acetaminophen (NORCO) 10-325 MG tablet, Take 1 tablet by mouth every 4 (four) hours as needed., Disp: 180 tablet, Rfl: 0 .  lidocaine-prilocaine (EMLA) cream, Apply to affected area once, Disp: 30 g, Rfl: 3 .  morphine (MS CONTIN) 15 MG 12 hr tablet, Take 1 tablet (15 mg total) by mouth every 12 (twelve) hours., Disp: 60 tablet, Rfl: 0 .  ondansetron (ZOFRAN ODT) 8 MG disintegrating tablet, Take 1 tablet (8 mg total) by mouth every 8 (eight) hours as needed for nausea or vomiting., Disp: 30 tablet, Rfl: 0 .  simvastatin (ZOCOR) 40 MG tablet, Take 40 mg by mouth daily at 6 PM. , Disp: , Rfl:  .  fentaNYL (DURAGESIC) 12 MCG/HR, Place 1 patch onto the skin every 3 (three) days. (Patient not taking: Reported on 04/13/2021), Disp: 10 patch, Rfl: 0 .  HYDROmorphone (DILAUDID) 2 MG tablet, 1 /2 tablet Every 4-6 hours as needed for pain, Disp: 120 tablet, Rfl: 0 .  OLANZapine (ZYPREXA) 10 MG tablet, Take 1 tablet (10 mg total) by mouth at bedtime., Disp: 30 tablet, Rfl: 1 .  potassium chloride (KLOR-CON) 10 MEQ tablet, TAKE 2 TABLETS BY  MOUTH ONCE DAILY (Patient not taking: Reported on 04/13/2021), Disp: 60 tablet, Rfl: 2 .  prochlorperazine (COMPAZINE) 10 MG tablet, Take 1 tablet (10 mg total) by mouth every 6 (six) hours as needed for nausea or vomiting. (Patient not taking: Reported on 04/13/2021), Disp: 30 tablet, Rfl: 0 No current facility-administered medications for this visit.  Facility-Administered Medications Ordered in Other Visits:  .  0.9 %  sodium chloride infusion, , Intravenous, Once PRN, Sindy Guadeloupe, MD .  albuterol (PROVENTIL) (2.5 MG/3ML) 0.083% nebulizer solution 2.5 mg, 2.5 mg, Nebulization, Once PRN, Sindy Guadeloupe, MD .  EPINEPHrine (ADRENALIN) 1 MG/10ML injection 0.25 mg, 0.25 mg, Intravenous, Once PRN, Sindy Guadeloupe, MD .  EPINEPHrine (ADRENALIN) 1 MG/10ML injection 0.25 mg, 0.25 mg, Intravenous, Once PRN, Sindy Guadeloupe, MD  Physical exam:  Vitals:   04/13/21 0940  Pulse: 76  Resp: 20  Temp: (!) 97 F (36.1 C)  TempSrc: Tympanic  SpO2: 97%  Weight: 259 lb (117.5 kg)   Physical Exam Constitutional:      Comments: Appears fatigued.  Sitting in a wheelchair  Cardiovascular:     Rate and Rhythm: Normal rate. Rhythm irregular.  Pulmonary:     Effort: Pulmonary effort is normal.     Breath sounds: Normal breath sounds.  Abdominal:     General: Bowel sounds are normal.  Musculoskeletal:     Comments: Bilateral +1 edema  Skin:    General: Skin is warm and dry.  Neurological:     Mental Status: He is alert and oriented to person, place, and time.      CMP Latest Ref Rng & Units 04/12/2021  Glucose 70 - 99 mg/dL 108(H)  BUN 8 - 23 mg/dL 16  Creatinine 0.61 - 1.24 mg/dL 0.97  Sodium 135 - 145 mmol/L 135  Potassium 3.5 - 5.1 mmol/L 3.9  Chloride 98 - 111 mmol/L 103  CO2 22 - 32 mmol/L 21(L)  Calcium 8.9 - 10.3 mg/dL 8.5(L)  Total Protein 6.5 - 8.1 g/dL 6.4(L)  Total Bilirubin 0.3 - 1.2 mg/dL 1.1  Alkaline Phos 38 - 126 U/L 159(H)  AST 15 - 41 U/L 39  ALT 0 - 44 U/L 11   CBC Latest  Ref Rng & Units 04/12/2021  WBC 4.0 - 10.5 K/uL 5.9  Hemoglobin 13.0 - 17.0 g/dL 10.3(L)  Hematocrit 39.0 - 52.0 % 31.6(L)  Platelets 150 - 400 K/uL 139(L)    No images are attached to the encounter.  NM Bone Scan Whole Body  Result Date: 04/11/2021 CLINICAL DATA:  Follow-up known bony metastatic disease. EXAM: NUCLEAR MEDICINE WHOLE BODY BONE SCAN TECHNIQUE: Whole body anterior and posterior images were obtained approximately 3 hours after intravenous injection of radiopharmaceutical. RADIOPHARMACEUTICALS:  22.8 mCi Technetium-84mMDP IV COMPARISON:  CT scan April 09, 2021.  Bone scan January 28, 2021 FINDINGS: The known metastasis at an anterior right rib is stable. The known metastasis in the left scapula may be slightly larger in the interval. Uptake in the left calvarium consistent with metastatic disease appears a little larger in the interval. Uptake in the lumbar spine at L3 is larger in the interval. There is some new uptake in the L1 region which is suspicious. Uptake in the thoracic spine appears a little increased. Uptake in a left posterior rib is new and suspicious. Uptake in a right posterior rib near the costovertebral junction was not seen previously. Uptake in 3 right posterior ribs represent sites of previous healed fractures. Uptake in the bilateral shoulders, left greater than right, is likely degenerative. Uptake in the maxilla is likely odontogenic. Patient is status post bilateral knee replacements. Degenerative changes are seen in the wrists. Soft tissue uptake is unremarkable. IMPRESSION: 1. Overall, there has been worsening in known bony metastatic disease. Several metastases appear little larger in the interval including the left calvarium, the left scapula, and L3. There are  few foci of new uptake in the thoracic spine and posterior ribs as above which are concerning. There is new uptake in the region of L1 or L2 which is new and concerning as well. 2. Uptake in the region of  the left hip replacement is stable. Electronically Signed   By: Dorise Bullion III M.D   On: 04/11/2021 15:50   Ultrasound renal complete  Result Date: 03/17/2021 CLINICAL DATA:  Right hydronephrosis EXAM: RENAL / URINARY TRACT ULTRASOUND COMPLETE COMPARISON:  CT chest, abdomen, and pelvis 01/28/2021 FINDINGS: Right Kidney: Renal measurements: 10.4 x 4 x 3 x 5.3 cm = volume: 125 mL. Echogenicity within normal limits. Persistent moderate right hydronephrosis and hydroureter. Left Kidney: Renal measurements: 13.4 x 4.9 x 3.4 cm = volume: 117 mL. Echogenicity within normal limits. No mass or hydronephrosis visualized. 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 Bladder: Bladder is decompressed and cannot be evaluated. Other: None. IMPRESSION: Unchanged moderate right hydronephrosis and hydroureter. Electronically Signed   By: Miachel Roux M.D.   On: 03/17/2021 08:33   CT CHEST ABDOMEN PELVIS W CONTRAST  Result Date: 04/10/2021 CLINICAL DATA:  75 year old male with history of prostate cancer with bone metastases. Follow-up study. EXAM: CT CHEST, ABDOMEN, AND PELVIS WITH CONTRAST TECHNIQUE: Multidetector CT imaging of the chest, abdomen and pelvis was performed following the standard protocol during bolus administration of intravenous contrast. CONTRAST:  153m OMNIPAQUE IOHEXOL 300 MG/ML  SOLN COMPARISON:  CT of the chest, abdomen and pelvis 01/28/2021. FINDINGS: CT CHEST FINDINGS Cardiovascular: Heart size is enlarged with left atrial dilatation. There is no significant pericardial fluid, thickening or pericardial calcification. There is aortic atherosclerosis, as well as atherosclerosis of the great vessels of the mediastinum and the coronary arteries, including calcified atherosclerotic plaque in the left main, left anterior descending, left circumflex and right coronary arteries. Mild thickening calcification of the aortic valve. Left-sided pacemaker device in place with lead tips terminating in the right atrium and  right ventricular apex. Right internal jugular single-lumen porta cath with tip terminating in the distal superior vena cava. Mediastinum/Nodes: Numerous enlarged supraclavicular lymph nodes bilaterally, largest of which is on the left side measuring up to 2.2 cm in short axis (axial image 3 of series 2). No other pathologically enlarged mediastinal or hilar lymph nodes. Lungs/Pleura: Chronic volume loss and architectural distortion in the left lower lobe, similar to the prior study. Small left pleural effusion, new compared to the prior study, lying dependently. No definite suspicious appearing pulmonary nodules or masses are noted. No acute consolidative airspace disease. Musculoskeletal: Multiple sclerotic lesions are again noted throughout the visualized axial and appendicular skeleton, similar to the prior study, largest of which is in the T4 vertebral body anteriorly measuring up to 2 cm in diameter. Chronic compression fracture of T2-3 with 20% loss of anterior vertebral body height. Chronic compression fracture of T12 with 70% loss of anterior vertebral body height. Multiple old healed right-sided posterior rib fractures are incidentally noted. CT ABDOMEN PELVIS FINDINGS Hepatobiliary: Innumerable hypovascular lesions are noted throughout the liver, largest of which is in the inferior aspect of the left lobe of the liver (axial image 70 of series 2) measuring 6.2 x 4.2 cm. No intra or extrahepatic biliary ductal dilatation. Gallbladder is normal in appearance. Pancreas: No pancreatic mass. No pancreatic ductal dilatation. No pancreatic or peripancreatic fluid collections or inflammatory changes. Spleen: Unremarkable. Adrenals/Urinary Tract: Mild bilateral renal atrophy. Low-attenuation lesions in the lower pole of the left kidney measuring up to 3.2 cm  in diameter, compatible with simple cysts. No left-sided hydroureteronephrosis. Right-sided double-J ureteral stent in place with proximal loop reformed in  the right renal pelvis and distal loop reformed in the lumen of the urinary bladder. No right hydroureteronephrosis. Urinary bladder is normal in appearance. Right adrenal gland is normal in appearance. Left adrenal gland is distorted secondary to adjacent lymphadenopathy (discussed below). Stomach/Bowel: The appearance of the stomach is normal. No pathologic dilatation of small bowel or colon. Several colonic diverticulae are noted, particularly in the sigmoid colon, without surrounding inflammatory changes to suggest an acute diverticulitis at this time. The appendix is not confidently identified and may be surgically absent. Regardless, there are no inflammatory changes noted adjacent to the cecum to suggest the presence of an acute appendicitis at this time. Vascular/Lymphatic: Aortic atherosclerosis, without evidence of aneurysm or dissection in the abdominal or pelvic vasculature. Worsening bulky lymphadenopathy noted in the retroperitoneum, which is progressive compared to the prior study, with the bulkiest portion of this conglomerate nodal mass currently measuring approximately 7.9 x 7.5 x 13.9 cm (axial image 70 of series 2 and coronal image 92 of series 6). Right-sided pelvic lymphadenopathy, largest of which is in the right common iliac nodal station measuring 2.1 cm in short axis (axial image 93 of series 2). Reproductive: Status post radical prostatectomy. Other: No significant volume of ascites.  No pneumoperitoneum. Musculoskeletal: Status post bilateral hip arthroplasty. Low-attenuation lesion in the right inguinal region measuring 3.5 cm in diameter, likely to represent a cyst. Enlarging sclerotic lesion in the anterior aspect of the L3 vertebral body measuring 3.3 cm in diameter, compatible with a metastatic lesion. IMPRESSION: 1. Progressive metastatic disease, including increased number of osseous lesions, widespread lymphadenopathy in the chest, abdomen and pelvis, and innumerable new hepatic  lesions, as detailed above. 2. Cardiomegaly with left atrial dilatation. 3. Aortic atherosclerosis, in addition to left main and 3 vessel coronary artery disease. Assessment for potential risk factor modification, dietary therapy or pharmacologic therapy may be warranted, if clinically indicated. 4. There are calcifications of the aortic valve. Echocardiographic correlation for evaluation of potential valvular dysfunction may be warranted if clinically indicated. 5. Chronic postoperative scarring in the left lower lobe, similar to prior studies. 6. Colonic diverticulosis without evidence of acute diverticulitis at this time. 7. Additional incidental findings, as above. Electronically Signed   By: Vinnie Langton M.D.   On: 04/10/2021 07:55   DG OR UROLOGY CYSTO IMAGE ([facility] ONLY)  Result Date: 03/26/2021 There is no interpretation for this exam.  This order is for images obtained during a surgical procedure.  Please See "Surgeries" Tab for more information regarding the procedure.   CT Renal Stone Study  Result Date: 04/12/2021 CLINICAL DATA:  75 year old male with nausea vomiting and hematuria. Rule urethral stent placement 2 weeks ago. Metastatic prostate cancer. EXAM: CT ABDOMEN AND PELVIS WITHOUT CONTRAST TECHNIQUE: Multidetector CT imaging of the abdomen and pelvis was performed following the standard protocol without IV contrast. COMPARISON:  Recent restaging CT Chest, Abdomen, and Pelvis 04/09/2021, and earlier. FINDINGS: Lower chest: Small but increasing left pleural effusion since 04/09/2021. Associated Patchy and confluent left lung base opacity is chronic and appears stable. Cardiomegaly. No pericardial effusion. Partially visible pacemaker leads. Trace new right pleural effusion. Stable mild right lung base scarring. Hepatobiliary: Trace but increased perihepatic free fluid with widespread hypodense hepatic metastases. Stable gallbladder and bile ducts. Pancreas: Atrophied. Spleen: Negative.  Adrenals/Urinary Tract: Metastatic ex nodal disease in the region of the left adrenal gland which  is difficult to delineate. Right adrenal remains normal. Mild mass effect on, lateral displacement of the left kidney from the bulky retroperitoneal tumor. Stable left renal collecting system without overt hydronephrosis. Left ureter appears stable into the pelvis, nondilated. Right double-J ureteral stent remains in place. Stable right renal collecting system. Stent terminates appropriately in the bladder as before. No adverse features identified. Stomach/Bowel: No dilated large or small bowel. Small volume of retained oral contrast from the recent restaging study. No free air. However there is a new small volume of free fluid tracking into the right pelvic inlet from the lower abdomen on series 2, image 76. But no adjacent bowel abnormality. Small fat and fluid containing umbilical hernia is stable. Vascular/Lymphatic: Aortoiliac calcified atherosclerosis. Vascular patency is not evaluated in the absence of IV contrast. Bulky retroperitoneal metastatic ex ill lymph nodal disease inseparable from the abdominal aorta and infrarenal IVC is stable from the recent restaging CT, encompassing up to 10 cm diameter in the left abdomen on series 2, image 42. Lesser pelvic lymphadenopathy is stable. Reproductive: Stable prostatectomy. Other: Stable presacral stranding.  No pelvic free fluid. Musculoskeletal: Osteopenia with chronic right posterior rib fractures. Heterogeneous bone mineralization in the spine with T12 moderate to severe compression fracture and sclerotic anterior L3 vertebral body lesion stable from the recent restaging study. Prior bilateral hip arthroplasty. No new osseous lesion identified. IMPRESSION: 1. Bulky retroperitoneal and liver metastatic disease as on the recent 04/09/2021 restaging CT. 2. Small but increasing volume of free fluid in the abdomen and pelvis. Small but increasing pleural effusions. 3.  Kidneys and ureters appear stable. Stable right double-J ureteral stent with no adverse features identified. 4. Stable osseous metastatic disease. 5. No bowel obstruction. Electronically Signed   By: Genevie Ann M.D.   On: 04/12/2021 08:57     Assessment and plan- Patient is a 75 y.o. male with metastatic castrate resistant prostate cancer with bone lymph node and liver metastases here to discuss further management  Patient last received 6 cycles of cabazitaxel chemotherapy ending in January 2022.  Following that scans relatively showed stable disease but his PSA has been consistently going up and most recently was elevated at 120 as compared to his prior value of 75.  Repeat CT chest abdomen and pelvis shows enlarged bilateral supraclavicular lymph nodes, worsening of his bone metastases as well as innumerable new liver metastases.  Bulky retroperitoneal adenopathy now measuring 7.9 x 7.5 x 13.9 cm concerning for aggressive progression of disease.  We discussed the following options moving forward  1.  Repeating ultrasound-guided supraclavicular lymph node biopsy for NGS testing and to rule out small cell transformation.  Prior NGS testing had failed RNA analysis.  Genetic testing has been performed to see if he would be a candidate for olaparib.  Outside of that the only possible option would be Xtandi which she has not tried before.  2.  Proceeding with best supportive care/hospice.  Patient and his wife would like to think about these options and get back to [place] later this week.  I will proceed with 1 L of IV fluids today and he will see NP Altha Harm from palliative care later this week for possible IV fluids and discuss further goals of care.  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  We will add Zyprexa 10 mg nightly to his regimen and he will try as needed Compazine instead of Zofran to see if it helps.  If nausea  remains uncontrolled a  short course of steroids could be considered at his next visit.  Neoplasm related pain: I will stop his hydrocodone and start him on Dilaudid 1 mg every 4 to every 6 as needed which can be titrated later this week.  If pain remains uncontrolled with short acting pain medicine long-acting pain medicine may be added.  We did prescribe him morphine long-acting which she picked up 3 days ago but reports he could not take it more than a couple of doses as it caused significant nausea.  Fentanyl patch is an option 4 to 5 days from now if pain remains uncontrolled.  Future follow-up appointments with me will be decided based on what patient decides in terms of overall treatment plans versus hospice   Total face to face encounter time for this patient visit was 40 min.     Visit Diagnosis 1. Goals of care, counseling/discussion   2. Neoplasm related pain   3. Metastasis to bone ([place])   4. Malignant neoplasm of prostate [facility])      Dr. Randa Evens, MD, MPH [facility] at [facility] [phone] 04/13/2021 4:44 PM

## 2021-04-17 ENCOUNTER — Inpatient Hospital Stay: Payer: PPO

## 2021-04-17 ENCOUNTER — Inpatient Hospital Stay: Payer: PPO | Admitting: Hospice and Palliative Medicine

## 2021-04-17 ENCOUNTER — Inpatient Hospital Stay (HOSPITAL_BASED_OUTPATIENT_CLINIC_OR_DEPARTMENT_OTHER): Payer: PPO | Admitting: Hospice and Palliative Medicine

## 2021-04-17 ENCOUNTER — Other Ambulatory Visit: Payer: Self-pay

## 2021-04-17 VITALS — BP 110/66 | Temp 97.0°F

## 2021-04-17 DIAGNOSIS — C61 Malignant neoplasm of prostate: Secondary | ICD-10-CM | POA: Diagnosis not present

## 2021-04-17 DIAGNOSIS — E860 Dehydration: Secondary | ICD-10-CM | POA: Diagnosis not present

## 2021-04-17 DIAGNOSIS — C7951 Secondary malignant neoplasm of bone: Secondary | ICD-10-CM | POA: Diagnosis not present

## 2021-04-17 DIAGNOSIS — Z95828 Presence of other vascular implants and grafts: Secondary | ICD-10-CM

## 2021-04-17 MED ORDER — SODIUM CHLORIDE 0.9% FLUSH
10.0000 mL | INTRAVENOUS | Status: DC | PRN
  Filled 2021-04-17: qty 10

## 2021-04-17 MED ORDER — SODIUM CHLORIDE 0.9 % IV SOLN: 10.0000 mg | Freq: Once | INTRAVENOUS | Status: DC

## 2021-04-17 MED ORDER — SODIUM CHLORIDE 0.9 % IV SOLN
Freq: Once | INTRAVENOUS | Status: AC
  Filled 2021-04-17: qty 250

## 2021-04-17 MED ORDER — DEXAMETHASONE SODIUM PHOSPHATE 10 MG/ML IJ SOLN
10.0000 mg | Freq: Once | INTRAMUSCULAR | Status: AC
  Administered 2021-04-17: 10 mg via INTRAVENOUS

## 2021-04-17 MED ORDER — HEPARIN SOD (PORK) LOCK FLUSH 100 UNIT/ML IV SOLN
500.0000 [IU] | Freq: Once | INTRAVENOUS | Status: AC
  Administered 2021-04-17: 500 [IU] via INTRAVENOUS
  Filled 2021-04-17: qty 5

## 2021-04-17 NOTE — Patient Instructions (Signed)
[facility]  Discharge Instructions: Thank you for choosing [facility] to provide your oncology and hematology care.  If you have a lab appointment with the [facility], please go directly to the [facility] and check in at the registration area.  Wear comfortable clothing and clothing appropriate for easy access to any Portacath or PICC line.   We strive to give you quality time with your provider. You may need to reschedule your appointment if you arrive late (15 or more minutes).  Arriving late affects you and other patients whose appointments are after yours.  Also, if you miss three or more appointments without notifying the office, you may be dismissed from the clinic at the provider's discretion.      For prescription refill requests, have your pharmacy contact our office and allow 72 hours for refills to be completed.    Today you received Iv Fluids and a steroid( Decadron)   To help prevent nausea and vomiting after your treatment, we encourage you to take your nausea medication as directed.  BELOW ARE SYMPTOMS THAT SHOULD BE REPORTED IMMEDIATELY: . *FEVER GREATER THAN 100.4 F (38 C) OR HIGHER . *CHILLS OR SWEATING . *NAUSEA AND VOMITING THAT IS NOT CONTROLLED WITH YOUR NAUSEA MEDICATION . *UNUSUAL SHORTNESS OF BREATH . *UNUSUAL BRUISING OR BLEEDING . *URINARY PROBLEMS (pain or burning when urinating, or frequent urination) . *BOWEL PROBLEMS (unusual diarrhea, constipation, pain near the anus) . TENDERNESS IN MOUTH AND THROAT WITH OR WITHOUT PRESENCE OF ULCERS (sore throat, sores in mouth, or a toothache) . UNUSUAL RASH, SWELLING OR PAIN  . UNUSUAL VAGINAL DISCHARGE OR ITCHING   Items with * indicate a potential emergency and should be followed up as soon as possible or go to the Emergency Department if any problems should occur.  Please show the CHEMOTHERAPY ALERT CARD or IMMUNOTHERAPY ALERT CARD at check-in to the  Emergency Department and triage nurse.  Should you have questions after your visit or need to cancel or reschedule your appointment, please contact [facility]  [phone] and follow the prompts.  Office hours are 8:00 a.m. to 4:30 p.m. Monday - Friday. Please note that voicemails left after 4:00 p.m. may not be returned until the following business day.  We are closed weekends and major holidays. You have access to a nurse at all times for urgent questions. Please call the main number to the clinic [phone] and follow the prompts.  For any non-urgent questions, you may also contact your provider using MyChart. We now offer e-Visits for anyone 22 and older to request care online for non-urgent symptoms. For details visit [email or web address].   Also download the MyChart app! Go to the app store, search "MyChart", open the app, select [facility], and log in with your MyChart username and password.  Due to Covid, a mask is required upon entering the hospital/clinic. If you do not have a mask, one will be given to you upon arrival. For doctor visits, patients may have 1 support person aged 54 or older with them. For treatment visits, patients cannot have anyone with them due to current Covid guidelines and our immunocompromised population.  Dexamethasone injection What is this medicine? DEXAMETHASONE (dex a METH a sone) is a corticosteroid. It is used to treat inflammation of the skin, joints, lungs, and other organs. Common conditions treated include asthma, allergies, and arthritis. It is also used for other conditions, like blood disorders and diseases of  the adrenal glands. This medicine may be used for other purposes; ask your health care provider or pharmacist if you have questions. COMMON BRAND NAME(S): Decadron, DoubleDex, ReadySharp Dexamethasone, Simplist Dexamethasone, Solurex What should I tell my health care provider before I take this  medicine? They need to know if you have any of these conditions:  Cushing's syndrome  diabetes  glaucoma  heart disease  high blood pressure  infection like herpes, measles, tuberculosis, or chickenpox  kidney disease  liver disease  mental illness  myasthenia gravis  osteoporosis  previous heart attack  seizures  stomach or intestine problems  thyroid disease  an unusual or allergic reaction to dexamethasone, corticosteroids, other medicines, lactose, foods, dyes, or preservatives  pregnant or trying to get pregnant  breast-feeding How should I use this medicine? This medicine is for injection into a muscle, joint, lesion, soft tissue, or vein. It is given by a health care professional in a hospital or clinic setting. Talk to your pediatrician regarding the use of this medicine in children. Special care may be needed. Overdosage: If you think you have taken too much of this medicine contact a poison control center or emergency room at once. NOTE: This medicine is only for you. Do not share this medicine with others. What if I miss a dose? This may not apply. If you are having a series of injections over a prolonged period, try not to miss an appointment. Call your doctor or health care professional to reschedule if you are unable to keep an appointment. What may interact with this medicine? Do not take this medicine with any of the following medications:  live virus vaccines This medicine may also interact with the following medications:  aminoglutethimide  amphotericin B  aspirin and aspirin-like medicines  certain antibiotics like erythromycin, clarithromycin, and troleandomycin  certain antivirals for HIV or hepatitis  certain medicines for seizures like carbamazepine, phenobarbital, phenytoin  certain medicines to treat myasthenia gravis  cholestyramine  cyclosporine  digoxin  diuretics  ephedrine  male hormones, like estrogen or  progestins and birth control pills  insulin or other medicines for diabetes  isoniazid  ketoconazole  medicines that relax muscles for surgery  mifepristone  NSAIDs, medicines for pain and inflammation, like ibuprofen or naproxen  rifampin  skin tests for allergies  thalidomide  vaccines  warfarin This list may not describe all possible interactions. Give your health care provider a list of all the medicines, herbs, non-prescription drugs, or dietary supplements you use. Also tell them if you smoke, drink alcohol, or use illegal drugs. Some items may interact with your medicine. What should I watch for while using this medicine? Visit your health care professional for regular checks on your progress. Tell your health care professional if your symptoms do not start to get better or if they get worse. Your condition will be monitored carefully while you are receiving this medicine. Wear a medical ID bracelet or chain. Carry a card that describes your disease and details of your medicine and dosage times. This medicine may increase your risk of getting an infection. Call your health care professional for advice if you get a fever, chills, or sore throat, or other symptoms of a cold or flu. Do not treat yourself. Try to avoid being around people who are sick. Call your health care professional if you are around anyone with measles, chickenpox, or if you develop sores or blisters that do not heal properly. If you are going to need surgery or  other procedures, tell your doctor or health care professional that you have taken this medicine within the last 12 months. Ask your doctor or health care professional about your diet. You may need to lower the amount of salt you eat. This medicine may increase blood sugar. Ask your healthcare provider if changes in diet or medicines are needed if you have diabetes. What side effects may I notice from receiving this medicine? Side effects that you  should report to your doctor or health care professional as soon as possible:  allergic reactions like skin rash, itching or hives, swelling of the face, lips, or tongue  bloody or black, tarry stools  changes in emotions or moods  changes in vision  confusion, excitement, restlessness  depressed mood  eye pain  hallucinations  muscle weakness  severe or sudden stomach or belly pain  signs and symptoms of high blood sugar such as being more thirsty or hungry or having to urinate more than normal. You may also feel very tired or have blurry vision.  signs and symptoms of infection like fever; chills; cough; sore throat; pain or trouble passing urine  swelling of ankles, feet  unusual bruising or bleeding  wounds that do not heal Side effects that usually do not require medical attention (report to your doctor or health care professional if they continue or are bothersome):  increased appetite  increased growth of face or body hair  headache  nausea, vomiting  pain, redness, or irritation at site where injected  skin problems, acne, thin and shiny skin  trouble sleeping  weight gain This list may not describe all possible side effects. Call your doctor for medical advice about side effects. You may report side effects to FDA at 1-800-FDA-1088. Where should I keep my medicine? This medicine is given in a hospital or clinic and will not be stored at home. NOTE: This sheet is a summary. It may not cover all possible information. If you have questions about this medicine, talk to your doctor, pharmacist, or health care provider.  2021 [employer] Standard (2019-06-12 13:51:58)

## 2021-04-17 NOTE — Progress Notes (Signed)
Patient tolerated IV fluid infusion well with Decadron, no concerns voiced. Patient discharged with wife. Stable.

## 2021-04-17 NOTE — Progress Notes (Signed)
[place]  Telephone:([phone] [phone]   Name: Jeremy Carney Date: 04/17/2021 MRN: [id number]  DOB: [birth year]  Patient Care Team: Rusty Aus, MD as PCP - General (Internal Medicine) Rusty Aus, MD (Internal Medicine)    REASON FOR CONSULTATION: Jeremy Carney is a 75 y.o. 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  Patient has had disease progression on multiple previous lines of chemotherapy.  Most recently he completed 6 cycles of cabazitaxel.  PSA has been uptrending. Performance status has been declining. Palliative care was consulted to help address goals.   SOCIAL HISTORY:     reports that he has never smoked. He has never used smokeless tobacco. He reports that he does not drink alcohol and does not use drugs.  Patient is married and lives at home with his wife.  He has children who live out of state.  Patient worked in Architect.  ADVANCE DIRECTIVES:  On file  CODE STATUS: DNR/DNI (DNR form signed on 04/17/2021)  PAST MEDICAL HISTORY: Past Medical History:  Diagnosis Date  . Arthritis, degenerative 10/30/2015  . Atrial fibrillation ([place]) 10/30/2015  . Bladder spasm 06/12/2013  . Carcinoma of prostate ([place]) 10/30/2015  . Cardiomyopathy ([place]) 09/10/2014   Overview:  EF 35%,4/15   . Cerebrovascular accident (CVA) ([place]) 10/30/2015   Overview:  Left embolic ATF,5732   . Difficult or painful urination 11/21/2013  . Dysrhythmia   . Gout 10/30/2015  . History of kidney stones   . Malignant neoplasm of prostate ([place]) 09/05/2012  . Presence of permanent cardiac pacemaker   . Sinoatrial node dysfunction ([facility]) 10/30/2015   Overview:  DDD pacemaker, 1996     PAST SURGICAL HISTORY:  Past Surgical History:  Procedure Laterality Date  . castration  10/2009  . CATARACT EXTRACTION W/ INTRAOCULAR LENS  IMPLANT, BILATERAL  Bilateral 2016   right eye done then the left one done 3 months later in 2016  . COLONOSCOPY WITH PROPOFOL N/A 11/21/2019   Procedure: COLONOSCOPY WITH PROPOFOL;  Surgeon: Robert Bellow, MD;  Location: [facility];  Service: Endoscopy;  Laterality: N/A;  . CYSTOSCOPY W/ RETROGRADES Bilateral 03/26/2021   Procedure: CYSTOSCOPY WITH RETROGRADE PYELOGRAM;  Surgeon: Hollice Espy, MD;  Location: [facility] ORS;  Service: Urology;  Laterality: Bilateral;  . CYSTOSCOPY W/ URETERAL STENT PLACEMENT Right 03/26/2021   Procedure: CYSTOSCOPY WITH STENT PLACEMENT;  Surgeon: Hollice Espy, MD;  Location: [facility] ORS;  Service: Urology;  Laterality: Right;  . INGUINAL HERNIA REPAIR Left   . INSERT / REPLACE / REMOVE PACEMAKER    . IR FLUORO GUIDE PORT INSERTION RIGHT  07/01/2017  . IR REMOVAL TUN ACCESS W/ PORT W/O FL MOD SED  06/20/2020  . IR REMOVAL TUN CV CATH W/O FL  08/01/2020  . JOINT REPLACEMENT Bilateral 1980   hips  . left knee replacement  2014  . PACEMAKER INSERTION    . PORTA CATH INSERTION N/A 09/11/2020   Procedure: PORTA CATH INSERTION;  Surgeon: Algernon Huxley, MD;  Location: [facility];  Service: Cardiovascular;  Laterality: N/A;  . PROSTATECTOMY    . REPLACEMENT TOTAL HIP W/  RESURFACING IMPLANTS  1997   bilateral-   . REPLACEMENT TOTAL KNEE Bilateral   . testes removal     per pt   . TONSILLECTOMY AND ADENOIDECTOMY    . TRANSURETHRAL RESECTION OF BLADDER TUMOR N/A 12/31/2015  Procedure: BLADDER BIOPSY;  Surgeon: Nickie Retort, MD;  Location: [facility];  Service: Urology;  Laterality: N/A;  . Urinary incontinence valve     AMS    HEMATOLOGY/ONCOLOGY HISTORY:  Oncology History  Malignant neoplasm of prostate ([place])  09/05/2012 Initial Diagnosis   Malignant neoplasm of prostate ([place])   06/27/2020 - 07/24/2020 Chemotherapy   The patient had sipuleucel-T (PROVENGE) infusion 250 mL, 250 mL, Intravenous,  Once, 3 of 3 cycles Administration: 250 mL (06/27/2020), 250 mL  (07/10/2020), 250 mL (07/24/2020)  for chemotherapy treatment.    09/16/2020 -  Chemotherapy    Patient is on Treatment Plan: PROSTATE CABAZITAXEL + PREDNISONE Q21D      01/30/2021 Cancer Staging   Staging form: Prostate, AJCC 7th Edition - Clinical: Stage IV (M1) - Signed by Sindy Guadeloupe, MD on 01/30/2021 Biopsy of metastatic site performed: Yes Source of metastatic specimen: Distant Lymph Nodes, Bone   Carcinoma of prostate ([place])  10/30/2015 Initial Diagnosis   Carcinoma of prostate ([place])     ALLERGIES:  is allergic to latex.  MEDICATIONS:  Current Outpatient Medications  Medication Sig Dispense Refill  . acetaminophen (TYLENOL) 325 MG tablet Take 650 mg by mouth 2 (two) times daily.    Marland Kitchen allopurinol (ZYLOPRIM) 300 MG tablet Take 300 mg by mouth daily.    . bisacodyl (DULCOLAX) 5 MG EC tablet Take 5 mg by mouth 2 (two) times daily as needed for moderate constipation.    . calcium carbonate (OS-CAL) 600 MG TABS tablet Take 600 mg by mouth 2 (two) times daily with a meal.    . cyanocobalamin (,VITAMIN B-12,) 1000 MCG/ML injection Inject 1,000 mcg into the muscle every 30 (thirty) days.     Marland Kitchen diltiazem (CARDIZEM CD) 180 MG 24 hr capsule Take 180 mg by mouth daily.    Marland Kitchen ELIQUIS 5 MG TABS tablet Take 5 mg by mouth 2 (two) times daily.    . fentaNYL (DURAGESIC) 12 MCG/HR Place 1 patch onto the skin every 3 (three) days. (Patient not taking: Reported on 04/13/2021) 10 patch 0  . furosemide (LASIX) 20 MG tablet Take 20 mg by mouth 2 (two) times daily.    Marland Kitchen gabapentin (NEURONTIN) 300 MG capsule Take 1 capsule (300 mg total) by mouth 3 (three) times daily. 90 capsule 3  . HYDROcodone-acetaminophen (NORCO) 10-325 MG tablet Take 1 tablet by mouth every 4 (four) hours as needed. 180 tablet 0  . HYDROmorphone (DILAUDID) 2 MG tablet 1 /2 tablet Every 4-6 hours as needed for pain 120 tablet 0  . lidocaine-prilocaine (EMLA) cream Apply to affected area once 30 g 3  . morphine (MS CONTIN) 15 MG 12  hr tablet Take 1 tablet (15 mg total) by mouth every 12 (twelve) hours. 60 tablet 0  . OLANZapine (ZYPREXA) 10 MG tablet Take 1 tablet (10 mg total) by mouth at bedtime. 30 tablet 1  . ondansetron (ZOFRAN ODT) 8 MG disintegrating tablet Take 1 tablet (8 mg total) by mouth every 8 (eight) hours as needed for nausea or vomiting. 30 tablet 0  . potassium chloride (KLOR-CON) 10 MEQ tablet TAKE 2 TABLETS BY MOUTH ONCE DAILY (Patient not taking: Reported on 04/13/2021) 60 tablet 2  . prochlorperazine (COMPAZINE) 10 MG tablet Take 1 tablet (10 mg total) by mouth every 6 (six) hours as needed for nausea or vomiting. (Patient not taking: Reported on 04/13/2021) 30 tablet 0  . simvastatin (ZOCOR) 40 MG tablet Take 40 mg by mouth daily at 6  PM.      No current facility-administered medications for this visit.   Facility-Administered Medications Ordered in Other Visits  Medication Dose Route Frequency Provider Last Rate Last Admin  . 0.9 %  sodium chloride infusion   Intravenous Once PRN Sindy Guadeloupe, MD      . albuterol (PROVENTIL) (2.5 MG/3ML) 0.083% nebulizer solution 2.5 mg  2.5 mg Nebulization Once PRN Sindy Guadeloupe, MD      . EPINEPHrine (ADRENALIN) 1 MG/10ML injection 0.25 mg  0.25 mg Intravenous Once PRN Sindy Guadeloupe, MD      . EPINEPHrine (ADRENALIN) 1 MG/10ML injection 0.25 mg  0.25 mg Intravenous Once PRN Sindy Guadeloupe, MD      . heparin lock flush 100 unit/mL  500 Units Intravenous Once Sindy Guadeloupe, MD      . sodium chloride flush (NS) 0.9 % injection 10 mL  10 mL Intravenous PRN Sindy Guadeloupe, MD        VITAL SIGNS: There were no vitals taken for this visit. There were no vitals filed for this visit.  Estimated body mass index is 35.13 kg/m as calculated from the following:   Height as of 04/12/21: 6' (1.829 m).   Weight as of 04/13/21: 259 lb (117.5 kg).  LABS: CBC:    Component Value Date/Time   WBC 5.9 04/12/2021 0805   HGB 10.3 (L) 04/12/2021 0805   HGB 8.9 (L) 09/07/2013 0549    HCT 31.6 (L) 04/12/2021 0805   HCT 36.2 (L) 06/07/2013 1329   PLT 139 (L) 04/12/2021 0805   PLT 73 (L) 09/07/2013 0549   MCV 88.0 04/12/2021 0805   MCV 92 06/07/2013 1329   NEUTROABS 4.3 04/12/2021 0805   NEUTROABS 2.4 06/07/2013 1329   LYMPHSABS 0.9 04/12/2021 0805   LYMPHSABS 0.6 (L) 06/07/2013 1329   MONOABS 0.5 04/12/2021 0805   MONOABS 0.3 06/07/2013 1329   EOSABS 0.1 04/12/2021 0805   EOSABS 0.1 06/07/2013 1329   BASOSABS 0.0 04/12/2021 0805   BASOSABS 0.0 06/07/2013 1329   Comprehensive Metabolic Panel:    Component Value Date/Time   NA 135 04/12/2021 0805   NA 138 04/08/2021 1336   NA 135 (L) 09/07/2013 0549   K 3.9 04/12/2021 0805   K 4.0 09/07/2013 0549   CL 103 04/12/2021 0805   CL 104 09/07/2013 0549   CO2 21 (L) 04/12/2021 0805   CO2 26 09/07/2013 0549   BUN 16 04/12/2021 0805   BUN 22 04/08/2021 1336   BUN 16 09/07/2013 0549   CREATININE 0.97 04/12/2021 0805   CREATININE 1.01 09/07/2013 0549   GLUCOSE 108 (H) 04/12/2021 0805   GLUCOSE 111 (H) 09/07/2013 0549   CALCIUM 8.5 (L) 04/12/2021 0805   CALCIUM 8.2 (L) 09/07/2013 0549   AST 39 04/12/2021 0805   AST 22 01/05/2013 1218   ALT 11 04/12/2021 0805   ALT 20 01/05/2013 1218   ALKPHOS 159 (H) 04/12/2021 0805   ALKPHOS 97 01/05/2013 1218   BILITOT 1.1 04/12/2021 0805   BILITOT 1.1 04/12/2017 1539   BILITOT 1.0 01/05/2013 1218   PROT 6.4 (L) 04/12/2021 0805   PROT 6.9 04/12/2017 1539   PROT 7.5 01/05/2013 1218   ALBUMIN 3.5 04/12/2021 0805   ALBUMIN 4.6 04/12/2017 1539   ALBUMIN 4.1 01/05/2013 1218    RADIOGRAPHIC STUDIES: NM Bone Scan Whole Body  Result Date: 04/11/2021 CLINICAL DATA:  Follow-up known bony metastatic disease. EXAM: NUCLEAR MEDICINE WHOLE BODY BONE SCAN TECHNIQUE: Whole body  anterior and posterior images were obtained approximately 3 hours after intravenous injection of radiopharmaceutical. RADIOPHARMACEUTICALS:  22.8 mCi Technetium-71mMDP IV COMPARISON:  CT scan April 09, 2021.   Bone scan January 28, 2021 FINDINGS: The known metastasis at an anterior right rib is stable. The known metastasis in the left scapula may be slightly larger in the interval. Uptake in the left calvarium consistent with metastatic disease appears a little larger in the interval. Uptake in the lumbar spine at L3 is larger in the interval. There is some new uptake in the L1 region which is suspicious. Uptake in the thoracic spine appears a little increased. Uptake in a left posterior rib is new and suspicious. Uptake in a right posterior rib near the costovertebral junction was not seen previously. Uptake in 3 right posterior ribs represent sites of previous healed fractures. Uptake in the bilateral shoulders, left greater than right, is likely degenerative. Uptake in the maxilla is likely odontogenic. Patient is status post bilateral knee replacements. Degenerative changes are seen in the wrists. Soft tissue uptake is unremarkable. IMPRESSION: 1. Overall, there has been worsening in known bony metastatic disease. Several metastases appear little larger in the interval including the left calvarium, the left scapula, and L3. There are few foci of new uptake in the thoracic spine and posterior ribs as above which are concerning. There is new uptake in the region of L1 or L2 which is new and concerning as well. 2. Uptake in the region of the left hip replacement is stable. Electronically Signed   By: DDorise BullionIII M.D   On: 04/11/2021 15:50   CT CHEST ABDOMEN PELVIS W CONTRAST  Result Date: 04/10/2021 CLINICAL DATA:  75year old male with history of prostate cancer with bone metastases. Follow-up study. EXAM: CT CHEST, ABDOMEN, AND PELVIS WITH CONTRAST TECHNIQUE: Multidetector CT imaging of the chest, abdomen and pelvis was performed following the standard protocol during bolus administration of intravenous contrast. CONTRAST:  1243mOMNIPAQUE IOHEXOL 300 MG/ML  SOLN COMPARISON:  CT of the chest, abdomen and  pelvis 01/28/2021. FINDINGS: CT CHEST FINDINGS Cardiovascular: Heart size is enlarged with left atrial dilatation. There is no significant pericardial fluid, thickening or pericardial calcification. There is aortic atherosclerosis, as well as atherosclerosis of the great vessels of the mediastinum and the coronary arteries, including calcified atherosclerotic plaque in the left main, left anterior descending, left circumflex and right coronary arteries. Mild thickening calcification of the aortic valve. Left-sided pacemaker device in place with lead tips terminating in the right atrium and right ventricular apex. Right internal jugular single-lumen porta cath with tip terminating in the distal superior vena cava. Mediastinum/Nodes: Numerous enlarged supraclavicular lymph nodes bilaterally, largest of which is on the left side measuring up to 2.2 cm in short axis (axial image 3 of series 2). No other pathologically enlarged mediastinal or hilar lymph nodes. Lungs/Pleura: Chronic volume loss and architectural distortion in the left lower lobe, similar to the prior study. Small left pleural effusion, new compared to the prior study, lying dependently. No definite suspicious appearing pulmonary nodules or masses are noted. No acute consolidative airspace disease. Musculoskeletal: Multiple sclerotic lesions are again noted throughout the visualized axial and appendicular skeleton, similar to the prior study, largest of which is in the T4 vertebral body anteriorly measuring up to 2 cm in diameter. Chronic compression fracture of T2-3 with 20% loss of anterior vertebral body height. Chronic compression fracture of T12 with 70% loss of anterior vertebral body height. Multiple old healed right-sided posterior  rib fractures are incidentally noted. CT ABDOMEN PELVIS FINDINGS Hepatobiliary: Innumerable hypovascular lesions are noted throughout the liver, largest of which is in the inferior aspect of the left lobe of the liver  (axial image 70 of series 2) measuring 6.2 x 4.2 cm. No intra or extrahepatic biliary ductal dilatation. Gallbladder is normal in appearance. Pancreas: No pancreatic mass. No pancreatic ductal dilatation. No pancreatic or peripancreatic fluid collections or inflammatory changes. Spleen: Unremarkable. Adrenals/Urinary Tract: Mild bilateral renal atrophy. Low-attenuation lesions in the lower pole of the left kidney measuring up to 3.2 cm in diameter, compatible with simple cysts. No left-sided hydroureteronephrosis. Right-sided double-J ureteral stent in place with proximal loop reformed in the right renal pelvis and distal loop reformed in the lumen of the urinary bladder. No right hydroureteronephrosis. Urinary bladder is normal in appearance. Right adrenal gland is normal in appearance. Left adrenal gland is distorted secondary to adjacent lymphadenopathy (discussed below). Stomach/Bowel: The appearance of the stomach is normal. No pathologic dilatation of small bowel or colon. Several colonic diverticulae are noted, particularly in the sigmoid colon, without surrounding inflammatory changes to suggest an acute diverticulitis at this time. The appendix is not confidently identified and may be surgically absent. Regardless, there are no inflammatory changes noted adjacent to the cecum to suggest the presence of an acute appendicitis at this time. Vascular/Lymphatic: Aortic atherosclerosis, without evidence of aneurysm or dissection in the abdominal or pelvic vasculature. Worsening bulky lymphadenopathy noted in the retroperitoneum, which is progressive compared to the prior study, with the bulkiest portion of this conglomerate nodal mass currently measuring approximately 7.9 x 7.5 x 13.9 cm (axial image 70 of series 2 and coronal image 92 of series 6). Right-sided pelvic lymphadenopathy, largest of which is in the right common iliac nodal station measuring 2.1 cm in short axis (axial image 93 of series 2).  Reproductive: Status post radical prostatectomy. Other: No significant volume of ascites.  No pneumoperitoneum. Musculoskeletal: Status post bilateral hip arthroplasty. Low-attenuation lesion in the right inguinal region measuring 3.5 cm in diameter, likely to represent a cyst. Enlarging sclerotic lesion in the anterior aspect of the L3 vertebral body measuring 3.3 cm in diameter, compatible with a metastatic lesion. IMPRESSION: 1. Progressive metastatic disease, including increased number of osseous lesions, widespread lymphadenopathy in the chest, abdomen and pelvis, and innumerable new hepatic lesions, as detailed above. 2. Cardiomegaly with left atrial dilatation. 3. Aortic atherosclerosis, in addition to left main and 3 vessel coronary artery disease. Assessment for potential risk factor modification, dietary therapy or pharmacologic therapy may be warranted, if clinically indicated. 4. There are calcifications of the aortic valve. Echocardiographic correlation for evaluation of potential valvular dysfunction may be warranted if clinically indicated. 5. Chronic postoperative scarring in the left lower lobe, similar to prior studies. 6. Colonic diverticulosis without evidence of acute diverticulitis at this time. 7. Additional incidental findings, as above. Electronically Signed   By: Vinnie Langton M.D.   On: 04/10/2021 07:55   DG OR UROLOGY CYSTO IMAGE ([facility] ONLY)  Result Date: 03/26/2021 There is no interpretation for this exam.  This order is for images obtained during a surgical procedure.  Please See "Surgeries" Tab for more information regarding the procedure.   CT Renal Stone Study  Result Date: 04/12/2021 CLINICAL DATA:  75 year old male with nausea vomiting and hematuria. Rule urethral stent placement 2 weeks ago. Metastatic prostate cancer. EXAM: CT ABDOMEN AND PELVIS WITHOUT CONTRAST TECHNIQUE: Multidetector CT imaging of the abdomen and pelvis was performed following the standard  protocol  without IV contrast. COMPARISON:  Recent restaging CT Chest, Abdomen, and Pelvis 04/09/2021, and earlier. FINDINGS: Lower chest: Small but increasing left pleural effusion since 04/09/2021. Associated Patchy and confluent left lung base opacity is chronic and appears stable. Cardiomegaly. No pericardial effusion. Partially visible pacemaker leads. Trace new right pleural effusion. Stable mild right lung base scarring. Hepatobiliary: Trace but increased perihepatic free fluid with widespread hypodense hepatic metastases. Stable gallbladder and bile ducts. Pancreas: Atrophied. Spleen: Negative. Adrenals/Urinary Tract: Metastatic ex nodal disease in the region of the left adrenal gland which is difficult to delineate. Right adrenal remains normal. Mild mass effect on, lateral displacement of the left kidney from the bulky retroperitoneal tumor. Stable left renal collecting system without overt hydronephrosis. Left ureter appears stable into the pelvis, nondilated. Right double-J ureteral stent remains in place. Stable right renal collecting system. Stent terminates appropriately in the bladder as before. No adverse features identified. Stomach/Bowel: No dilated large or small bowel. Small volume of retained oral contrast from the recent restaging study. No free air. However there is a new small volume of free fluid tracking into the right pelvic inlet from the lower abdomen on series 2, image 76. But no adjacent bowel abnormality. Small fat and fluid containing umbilical hernia is stable. Vascular/Lymphatic: Aortoiliac calcified atherosclerosis. Vascular patency is not evaluated in the absence of IV contrast. Bulky retroperitoneal metastatic ex ill lymph nodal disease inseparable from the abdominal aorta and infrarenal IVC is stable from the recent restaging CT, encompassing up to 10 cm diameter in the left abdomen on series 2, image 42. Lesser pelvic lymphadenopathy is stable. Reproductive: Stable prostatectomy.  Other: Stable presacral stranding.  No pelvic free fluid. Musculoskeletal: Osteopenia with chronic right posterior rib fractures. Heterogeneous bone mineralization in the spine with T12 moderate to severe compression fracture and sclerotic anterior L3 vertebral body lesion stable from the recent restaging study. Prior bilateral hip arthroplasty. No new osseous lesion identified. IMPRESSION: 1. Bulky retroperitoneal and liver metastatic disease as on the recent 04/09/2021 restaging CT. 2. Small but increasing volume of free fluid in the abdomen and pelvis. Small but increasing pleural effusions. 3. Kidneys and ureters appear stable. Stable right double-J ureteral stent with no adverse features identified. 4. Stable osseous metastatic disease. 5. No bowel obstruction. Electronically Signed   By: Genevie Ann M.D.   On: 04/12/2021 08:57    PERFORMANCE STATUS (ECOG) : 3 - Symptomatic, >50% confined to bed  Review of Systems Unless otherwise noted, a complete review of systems is negative.  Physical Exam General: NAD Pulmonary: Unlabored Extremities: no edema, no joint deformities Skin: no rashes Neurological: Weakness but otherwise nonfocal  IMPRESSION: I met with patient's wife in the consult room and then saw patient in the infusion area.  Introduced palliative care services and attempted to establish therapeutic rapport.   Both patient and wife tell me that he has declined fairly significantly over the past couple of weeks.  His performance status is poor.  He is quite weak and unable to stand without assistance from his wife.  He is ambulating with use of a cane and has fallen daily over the past 4 days.  Each time, EMS has had to assist family with picking patient up off the floor.  Symptomatically, his pain and nausea are improved.  His appetite is improved but fluid intake is still poor.  Patient is getting IV fluids today and IV steroids.  Had a lengthy conversation with both patient and wife  regarding overall  goals for treatment.  G[facility] has been mentioned as a possible option but given his decline, both patient and wife verbalized feeling that that was not the most appropriate course of action at this time.  Patient did reserve the option of changing his mind in the future should he improved.  However, for now both patient and wife are interested in maximizing their support at home via hospice care.  I called in an order for hospice to [facility] and requested that they prioritize the admission visit. Patient will require DME.   We discussed CODE STATUS.  Both patient and wife verbalized a desire for his life not to be prolonged artificially on machines nor would they want him to be resuscitated.  They were in agreement with DNR/DNI.  I signed a DNR order for them to take home today.  PLAN: -Best supportive care -DNR/DNI -Referral for hospice  Case and plan discussed with Dr. Janese Banks    Time Total: 45 minutes  Visit consisted of counseling and education dealing with the complex and emotionally intense issues of symptom management and palliative care in the setting of serious and potentially life-threatening illness.Greater than 50%  of this time was spent counseling and coordinating care related to the above assessment and plan.  Signed by: Altha Harm, PhD, NP-C

## 2021-04-22 ENCOUNTER — Telehealth: Payer: Self-pay | Admitting: Hospice and Palliative Medicine

## 2021-04-22 NOTE — Telephone Encounter (Signed)
I spoke with on call hospice nurse. Patient has continued to have severe and persistent generalized pain. Wife has dose increased the hydromorphone to a full tablet (2mg ) and this has apparently helped but is short lived. She is having to give it every 2-3 hours around the clock. Fentanyl patch had been previously prescribed but patient's insurance would not cover. Now that patient is followed by hospice, will have hospice pay for prescription and start transdermal fentanyl as a long acting analgesic. Given that patient's hydromorphone requirements have increased, it may be reasonable to dose increase fentanyl to 107mcg Q72H if needed. Primary hospice RN to follow up tomorrow to assess pain.

## 2021-04-27 ENCOUNTER — Other Ambulatory Visit: Payer: PPO

## 2021-05-01 ENCOUNTER — Ambulatory Visit: Payer: PPO

## 2021-05-01 ENCOUNTER — Other Ambulatory Visit: Payer: PPO

## 2021-05-01 ENCOUNTER — Inpatient Hospital Stay: Payer: PPO

## 2021-05-01 ENCOUNTER — Ambulatory Visit: Payer: PPO | Admitting: Oncology

## 2021-05-01 ENCOUNTER — Inpatient Hospital Stay: Payer: PPO | Admitting: Oncology

## 2021-05-02 IMAGING — US US RENAL
1 series · 14 of 25 positions shown · non-contrast
Comparison: CT chest, abdomen, and pelvis 01/28/2021

CLINICAL DATA: Right hydronephrosis

EXAM:
RENAL / URINARY TRACT ULTRASOUND COMPLETE

[Series 1: us renal · 0.26mm/px · 14 of 43 slices shown]
[im 1/43]
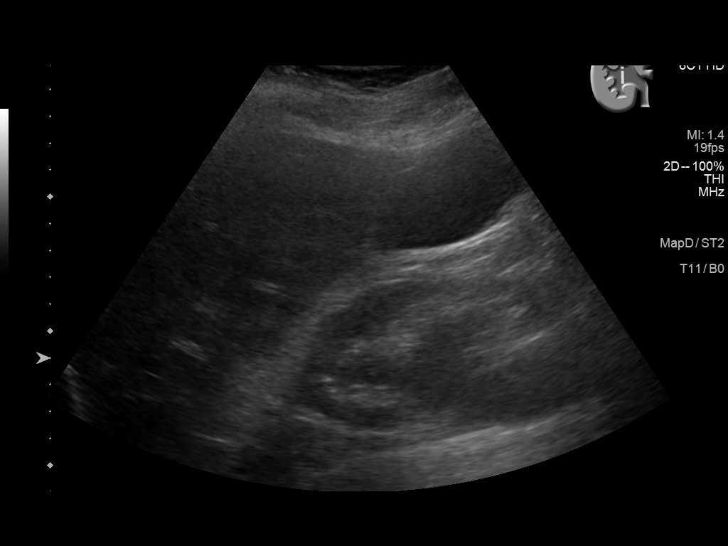
[im 4/43]
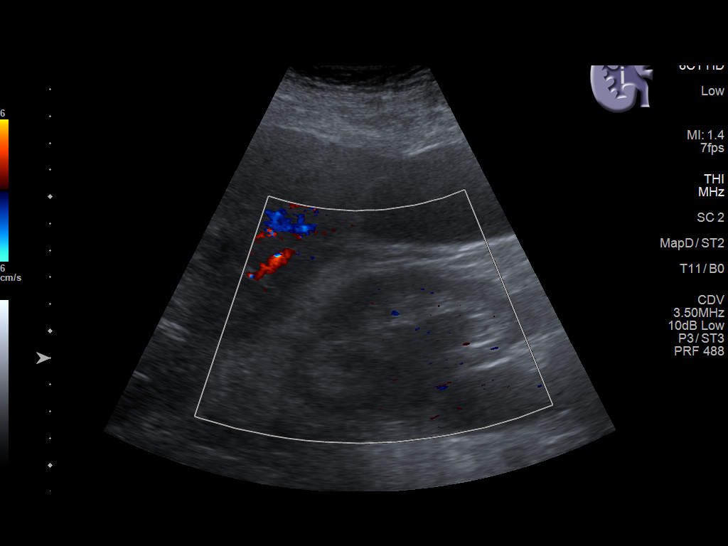
[im 8/43]
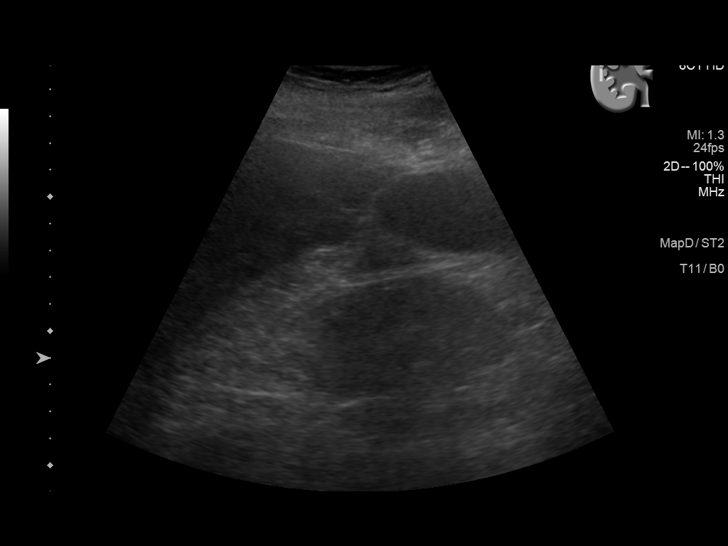
[im 11/43]
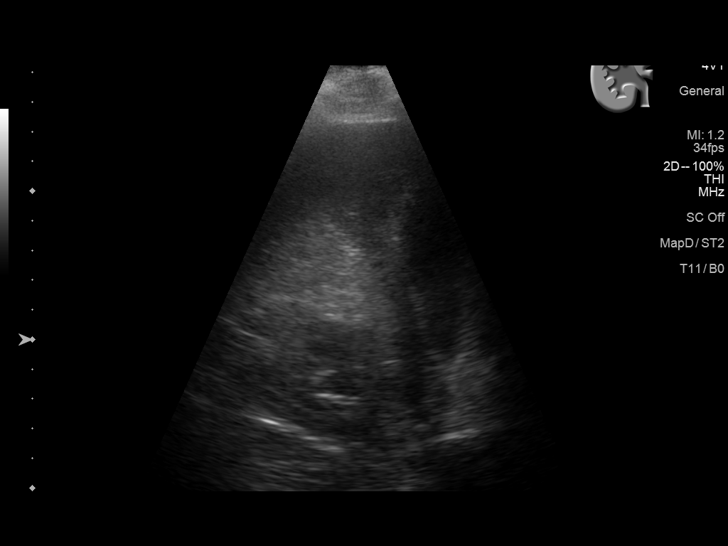
[im 15/43]
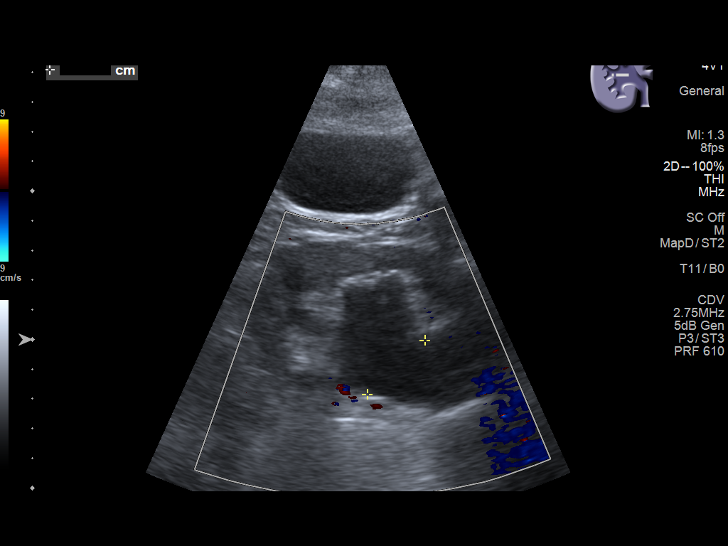
[im 16/43]
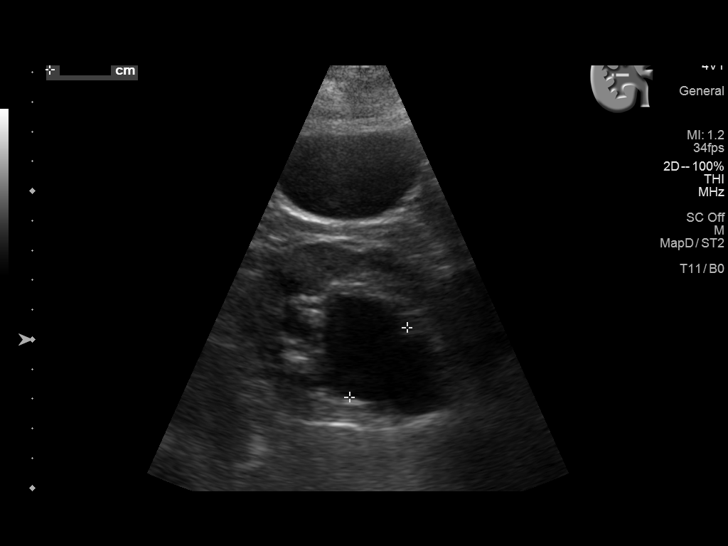
[im 20/43]
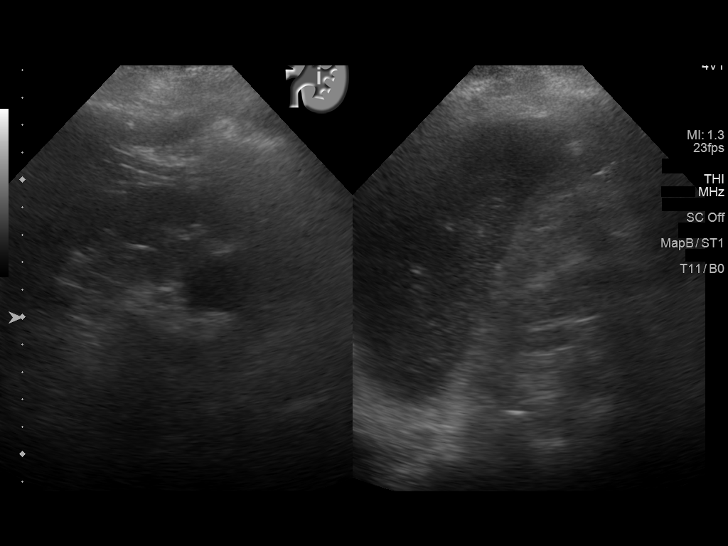
[im 23/43]
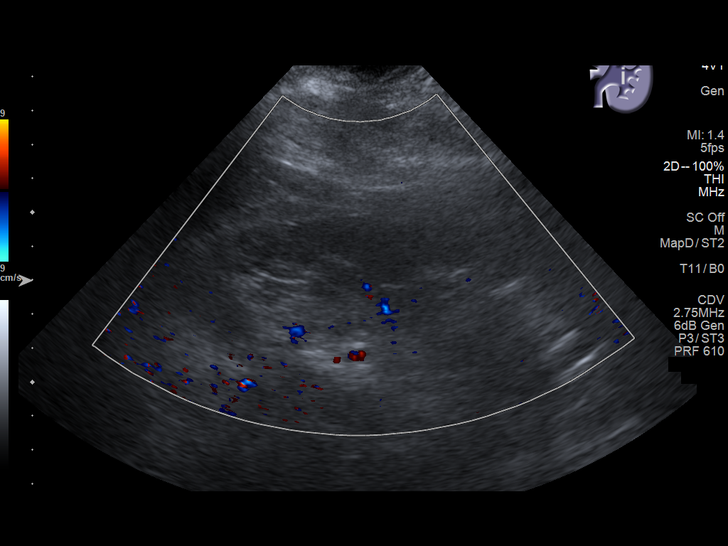
[im 27/43]
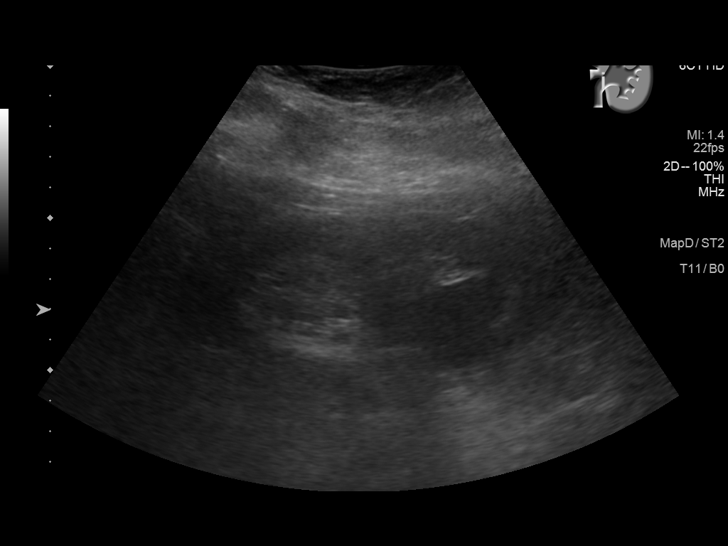
[im 29/43]
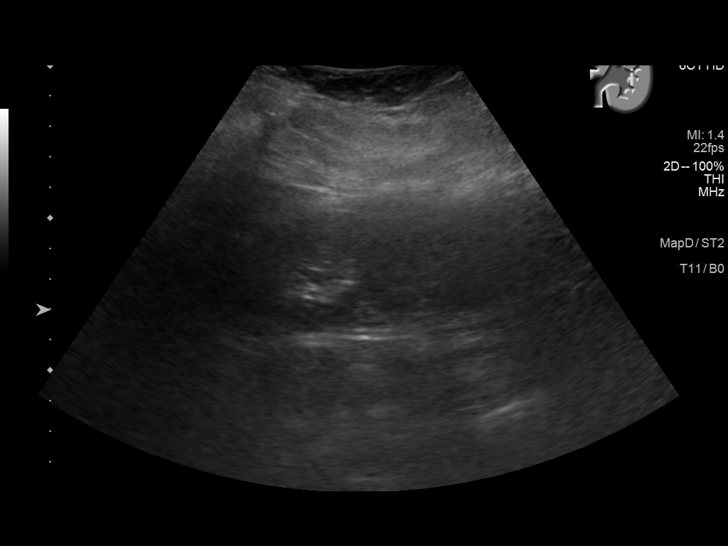
[im 32/43]
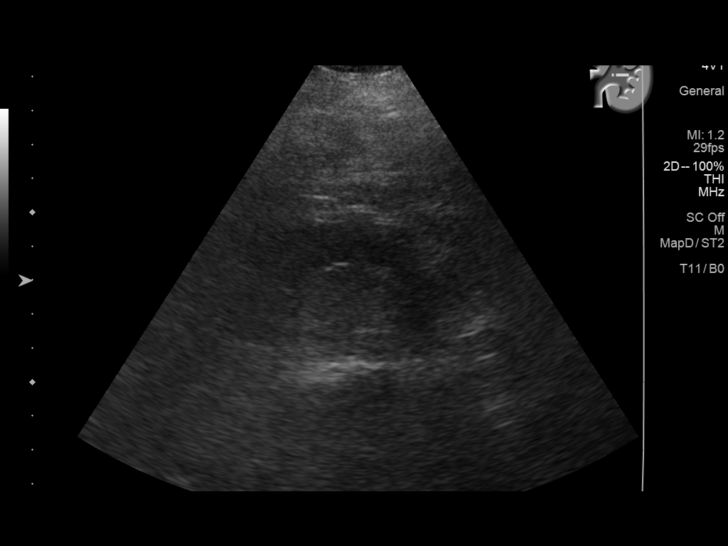
[im 36/43]
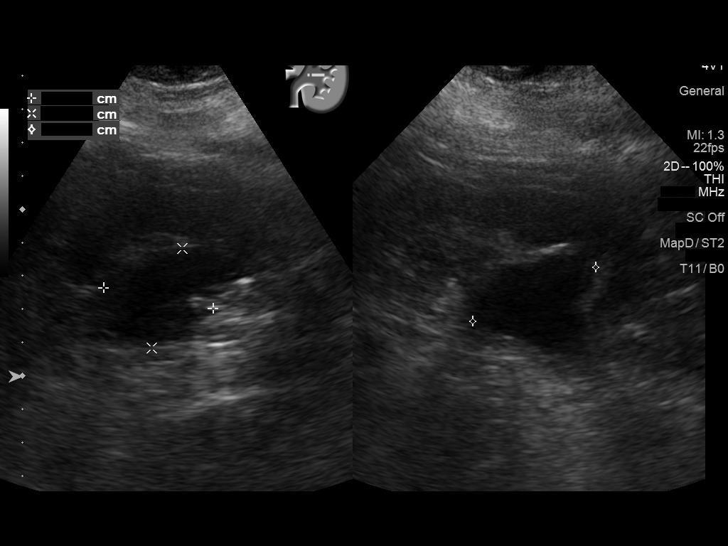
[im 39/43]
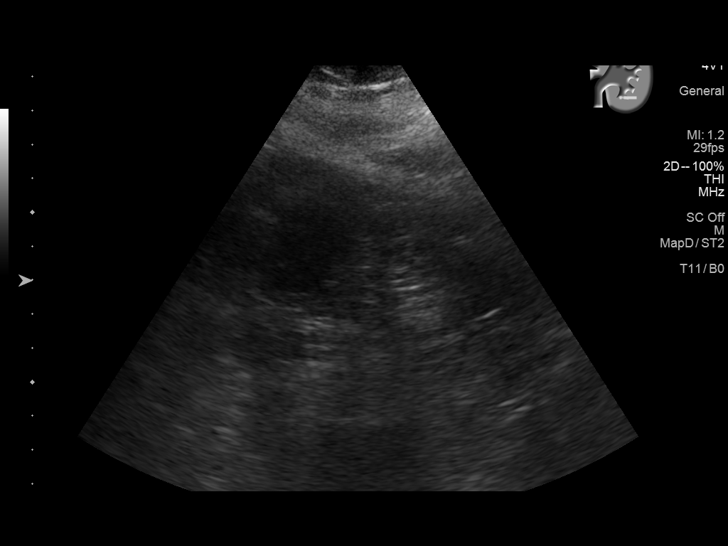
[im 43/43]
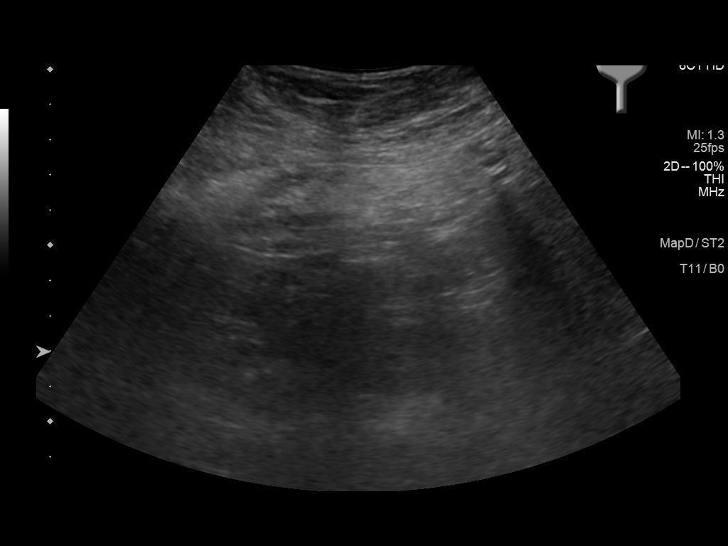

[14 of 25 positions shown; findings below may reference images not displayed]

FINDINGS: Right Kidney:

Renal measurements: 10.4 x 4 x 3 x 5.3 cm = volume: 125 mL.
Echogenicity within normal limits. Persistent moderate right
hydronephrosis and hydroureter.

Left Kidney:

Renal measurements: 13.4 x 4.9 x 3.4 cm = volume: 117 mL.
Echogenicity within normal limits. No mass or hydronephrosis
visualized. 4 cm renal pelvic cyst again seen.

Bladder:

Bladder is decompressed and cannot be evaluated.

Other:

None.
IMPRESSION: Unchanged moderate right hydronephrosis and hydroureter.

## 2021-05-13 DEATH — deceased

## 2023-06-07 NOTE — Telephone Encounter (Signed)
Error
